# Patient Record
Sex: Female | Born: 1945 | Race: White | Smoking: Never smoker
Health system: Northeastern US, Academic
[De-identification: ages and names within clinical notes are randomized; demographics above are authoritative.]

## PROBLEM LIST (undated history)

## (undated) DIAGNOSIS — R7303 Prediabetes: Secondary | ICD-10-CM

## (undated) DIAGNOSIS — I2699 Other pulmonary embolism without acute cor pulmonale: Secondary | ICD-10-CM

## (undated) DIAGNOSIS — J45909 Unspecified asthma, uncomplicated: Secondary | ICD-10-CM

## (undated) DIAGNOSIS — I2510 Atherosclerotic heart disease of native coronary artery without angina pectoris: Secondary | ICD-10-CM

## (undated) DIAGNOSIS — M1990 Unspecified osteoarthritis, unspecified site: Secondary | ICD-10-CM

## (undated) DIAGNOSIS — I82409 Acute embolism and thrombosis of unspecified deep veins of unspecified lower extremity: Secondary | ICD-10-CM

## (undated) HISTORY — DX: Prediabetes: R73.03

## (undated) HISTORY — PX: CHOLECYSTECTOMY: SHX55

## (undated) HISTORY — PX: CYST REMOVAL: SHX22

## (undated) HISTORY — DX: Unspecified asthma, uncomplicated: J45.909

## (undated) HISTORY — PX: APPENDECTOMY: SHX54

## (undated) HISTORY — PX: TONSILLECTOMY AND ADENOIDECTOMY: SHX28

## (undated) HISTORY — PX: ACHILLES TENDON SURGERY: SHX542

## (undated) SURGERY — Surgical Case
Anesthesia: *Unknown

## (undated) NOTE — Progress Notes (Signed)
Formatting of this note is different from the original.  Subjective    [name] is a 89 y.o. female.    Chief Complaint   Patient presents with   ? Follow-up     3 mo follow up   ? Asthma   ? Hyperlipidemia   ? Congestive Heart Failure   ? Earache     Right ear ache     Patient with a medical history significant for prediabetes, CAD s/p triple bypass, carotid artery stenosis, DVT/PE, HLD, asthma, restless legs, and GERD presents for follow up.   -She is soon going to [place] for consultation regarding aortic stenosis  -Complains of right ear pain for the past 3-4 days       Never Smoker  Discussed meditteranean diet, it constituents, portion control, regular aerobic exercise with progressive increase in activity to achieve 40-50 mins of aerobic workout daily.     Past Medical History:   Diagnosis Date   ? Asthma    ? Cardiovascular disease    ? Delayed emergence from general anesthesia    ? Depression    ? DVT (deep venous thrombosis) (CMS/HCC) ([facility])    ? High cholesterol    ? Neck pain    ? PE (pulmonary thromboembolism) ([place])      Past Surgical History:   Procedure Laterality Date   ? APPENDECTOMY     ? ARTERIAL BYPASS SURGERY     ? CAROTID ARTERY ANGIOPLASTY Right    ? CHOLECYSTECTOMY     ? FINGER SURGERY      CYST   ? TONSILLECTOMY       Current Outpatient Medications   Medication Sig Dispense Refill   ? albuterol 2.5 mg /3 mL (0.083 %) nebulizer solution Take 3 mL (2.5 mg total) by nebulization 4 (four) times a day if needed for wheezing or shortness of breath. 360 mL 5   ? albuterol HFA (PROVENTIL HFA) 90 mcg/actuation inhaler Inhale 2 puffs every 4 (four) hours if needed for wheezing. 18 g 5   ? aspirin EC 81 mg tablet Take 1 tablet (81 mg total) by mouth in the morning. 90 tablet 2   ? budesonide (PULMICORT) 0.25 mg/2 mL nebulizer solution Take 2 mL (0.25 mg total) by nebulization 1 (one) time each day if needed (SOB). 60 mL 2   ? cetirizine (ZyrTEC) 10 mg tablet Take 2 tablets (20 mg total) by mouth  in the morning. 180 tablet 2   ? clopidogreL (PLAVIX) 75 mg tablet Take 1 tablet (75 mg total) by mouth in the morning. 90 tablet 3   ? docusate sodium (COLACE) 100 mg capsule Take 100 mg by mouth 2 (two) times a day if needed for constipation.     ? fluticasone propionate (FLONASE) 50 mcg/actuation nasal spray Administer 2 sprays into each nostril in the morning. 16 g 5   ? fluticasone-umeclidin-vilanter 200-62.5-25 mcg blister with device Inhale 1 puff daily.     ? gabapentin (NEURONTIN) 300 mg capsule Take 1 capsule (300 mg total) by mouth in the morning and 1 capsule (300 mg total) at noon and 1 capsule (300 mg total) in the evening. 270 capsule 2   ? montelukast (SINGULAIR) 10 mg tablet Take 1 tablet (10 mg total) by mouth every night. 90 tablet 2   ? pantoprazole (PROTONIX) 40 mg EC tablet Take 1 tablet (40 mg total) by mouth 1 (one) time each day before breakfast. Do not crush, chew, or split. 90 tablet  3   ? polyethylene glycol (MIRALAX) 17 gram/dose powder Take 17 g by mouth 1 (one) time each day if needed (CONSTIPATION).     ? sertraline (ZOLOFT) 100 mg tablet Take 1 tablet (100 mg total) by mouth in the morning. 90 tablet 3   ? atorvastatin (LIPITOR) 80 mg tablet Take 1 tablet (80 mg total) by mouth at bed time. 90 tablet 3     No current facility-administered medications for this visit.     Allergies   Allergen Reactions   ? Other Unknown     Steri-strips AFTER BYPASS - HIVES, ITCHING.     ? Morphine Hives and Rash     Has tolerated Percocet    IV MORPHINE      Review of Systems   Constitutional: Negative for fatigue, fever and unexpected weight change.   HENT: Positive for ear pain. Negative for hearing loss and sore throat.    Eyes: Negative for discharge, redness and visual disturbance.   Respiratory: Negative for cough and shortness of breath.    Cardiovascular: Negative for chest pain and palpitations.   Gastrointestinal: Negative for abdominal pain, constipation, diarrhea, nausea and vomiting.    Endocrine: Negative for cold intolerance, heat intolerance and polydipsia.   Genitourinary: Negative for dysuria, frequency and hematuria.   Musculoskeletal: Negative for arthralgias and myalgias.   Skin: Negative for rash.   Neurological: Negative for headaches.   Hematological: Does not bruise/bleed easily.   Psychiatric/Behavioral: Negative for dysphoric mood.     Objective   Visit Vitals  BP 112/64 (BP Location: Left arm, Patient Position: Sitting, BP Cuff Size: Adult)   Pulse 80   Temp 36.5 C (97.7 F) (Temporal)   Resp 18   Ht '5\' 7"'$  (1.702 m)   Wt 105 kg (231 lb 9.6 oz)   SpO2 97%   BMI 36.27 kg/m   OB Status Postmenopausal   Smoking Status Never   BSA 2.23 m     Physical Exam  Constitutional:       General: She is not in acute distress.     Appearance: She is not ill-appearing.   HENT:      Right Ear: There is impacted cerumen.      Left Ear: Tympanic membrane, ear canal and external ear normal. There is no impacted cerumen.   Cardiovascular:      Rate and Rhythm: Normal rate and regular rhythm.      Pulses: Normal pulses.      Heart sounds: Murmur heard.   Pulmonary:      Effort: Pulmonary effort is normal. No respiratory distress.      Breath sounds: Normal breath sounds.   Neurological:      Mental Status: She is alert and oriented to person, place, and time.   Psychiatric:         Mood and Affect: Mood normal.     Diagnoses and all orders for this visit:  Anxiety and depression  Comments:  Stable. Continue Zoloft 100 mg daily.  Never smoked cigarettes  Class 2 obesity due to excess calories without serious comorbidity with body mass index (BMI) of 36.0 to 36.9 in adult  Comments:  BMI counseling provided regarding lifestyle modification, healthy diet, regular exercise as tolerated    Mixed hyperlipidemia  Comments:  Controlled. Continue Atorvastatin 80 mg daily.  Orders:  -     atorvastatin (LIPITOR) 80 mg tablet; Take 1 tablet (80 mg total) by mouth at bed time.  Prediabetes  Comments:  Recommend  adherence to ADA diet.  Excessive ear wax, right  Comments:  Patient declines removal at this time.   Moderate persistent asthma without complication  Comments:  Stable. Continue current management.      Follow up in about 6 months (around 05/08/2022).    Estevan Ryder, MD  11/06/2021      Electronically signed by Estevan Ryder, MD at 11/06/2021 11:04 PM EDT

---

## 2010-05-13 HISTORY — PX: CORONARY ANGIOPLASTY WITH STENT PLACEMENT: SHX49

## 2011-05-14 HISTORY — PX: CORONARY ARTERY BYPASS GRAFT: SHX141

## 2013-03-27 DIAGNOSIS — E782 Mixed hyperlipidemia: Secondary | ICD-10-CM | POA: Insufficient documentation

## 2013-03-27 DIAGNOSIS — I10 Essential (primary) hypertension: Secondary | ICD-10-CM | POA: Insufficient documentation

## 2013-03-27 DIAGNOSIS — J45909 Unspecified asthma, uncomplicated: Secondary | ICD-10-CM | POA: Insufficient documentation

## 2013-03-27 DIAGNOSIS — M549 Dorsalgia, unspecified: Secondary | ICD-10-CM | POA: Insufficient documentation

## 2013-03-27 DIAGNOSIS — I2510 Atherosclerotic heart disease of native coronary artery without angina pectoris: Secondary | ICD-10-CM | POA: Insufficient documentation

## 2013-04-12 DIAGNOSIS — R0602 Shortness of breath: Secondary | ICD-10-CM | POA: Insufficient documentation

## 2013-04-12 DIAGNOSIS — I82401 Acute embolism and thrombosis of unspecified deep veins of right lower extremity: Secondary | ICD-10-CM | POA: Insufficient documentation

## 2013-04-12 DIAGNOSIS — I2699 Other pulmonary embolism without acute cor pulmonale: Secondary | ICD-10-CM | POA: Insufficient documentation

## 2013-06-12 ENCOUNTER — Observation Stay
Admission: EM | Admit: 2013-06-12 | Disposition: A | Discharge status: Home / Self Care | Payer: Self-pay | Source: Ambulatory Visit | Attending: Emergency Medicine | Admitting: Emergency Medicine

## 2013-06-12 ENCOUNTER — Encounter: Payer: Self-pay | Admitting: Emergency Medicine

## 2013-06-12 ENCOUNTER — Other Ambulatory Visit: Payer: Self-pay | Admitting: Gastroenterology

## 2013-06-12 HISTORY — DX: Other pulmonary embolism without acute cor pulmonale: I26.99

## 2013-06-12 HISTORY — DX: Atherosclerotic heart disease of native coronary artery without angina pectoris: I25.10

## 2013-06-12 HISTORY — DX: Acute embolism and thrombosis of unspecified deep veins of unspecified lower extremity: I82.409

## 2013-06-12 HISTORY — DX: Unspecified osteoarthritis, unspecified site: M19.90

## 2013-06-12 LAB — BASIC METABOLIC PANEL
Anion Gap: 11 (ref 7–16)
CO2: 26 mmol/L (ref 20–28)
Calcium: 8.7 mg/dL (ref 8.6–10.2)
Chloride: 105 mmol/L (ref 96–108)
Creatinine: 0.69 mg/dL (ref 0.51–0.95)
GFR,Black: 104 *
GFR,Caucasian: 91 *
Glucose: 111 mg/dL — ABNORMAL HIGH (ref 60–99)
Lab: 17 mg/dL (ref 6–20)
Potassium: 4.3 mmol/L (ref 3.3–5.1)
Sodium: 142 mmol/L (ref 133–145)

## 2013-06-12 LAB — HOLD BLUE

## 2013-06-12 LAB — CBC AND DIFFERENTIAL
Baso # K/uL: 0.1 10*3/uL (ref 0.0–0.1)
Basophil %: 0.7 % (ref 0.1–1.2)
Eos # K/uL: 0.2 10*3/uL (ref 0.0–0.4)
Eosinophil %: 2.1 % (ref 0.7–5.8)
Hematocrit: 39 % (ref 34–45)
Hemoglobin: 11.9 g/dL (ref 11.2–15.7)
Lymph # K/uL: 2.5 10*3/uL (ref 1.2–3.7)
Lymphocyte %: 26.4 % (ref 19.3–51.7)
MCH: 28 pg/cell (ref 26–32)
MCHC: 31 g/dL — ABNORMAL LOW (ref 32–36)
MCV: 92 fL (ref 79–95)
Mono # K/uL: 0.9 10*3/uL (ref 0.2–0.9)
Monocyte %: 9.7 % (ref 4.7–12.5)
Neut # K/uL: 5.8 10*3/uL (ref 1.6–6.1)
Platelets: 331 10*3/uL (ref 160–370)
RBC: 4.2 MIL/uL (ref 3.9–5.2)
RDW: 14.2 % (ref 11.7–14.4)
Seg Neut %: 61.1 % (ref 34.0–71.1)
WBC: 9.6 10*3/uL (ref 4.0–10.0)

## 2013-06-12 LAB — PROTIME-INR
INR: 2.3 — ABNORMAL HIGH (ref 1.0–1.2)
Protime: 25 s — ABNORMAL HIGH (ref 9.2–12.3)

## 2013-06-12 LAB — TROPONIN T: Troponin T: <0.01 ng/mL (ref 0.00–0.02)

## 2013-06-12 LAB — NT-PRO BNP: NT-pro BNP: 105 pg/mL (ref 0–900)

## 2013-06-12 MED ORDER — IPRATROPIUM-ALBUTEROL 0.5-2.5 MG/3ML IN SOLN *I*
3.0000 mL | RESPIRATORY_TRACT | Status: AC
Start: 2013-06-12 — End: 2013-06-13
  Administered 2013-06-12 – 2013-06-13 (×3): 3 mL via RESPIRATORY_TRACT
  Filled 2013-06-12 (×3): qty 3

## 2013-06-12 MED ORDER — ASPIRIN 81 MG PO CHEW *I*
324.0000 mg | CHEWABLE_TABLET | Freq: Once | ORAL | Status: AC
Start: 2013-06-12 — End: 2013-06-12
  Administered 2013-06-12: 324 mg via ORAL
  Filled 2013-06-12: qty 4

## 2013-06-12 NOTE — ED Notes (Signed)
x2 weeks increase fluid, SOB to the bathroom, mild chest pain.  bilateral legs swelling.

## 2013-06-12 NOTE — First Provider Contact (Signed)
ED Medical Screening Exam Note    Initial provider evaluation performed by   ED First Provider Contact    Date/Time Event User Comments    06/12/13 2010 ED Provider First Contact Keelia Graybill Initial Face to Face Provider Contact        68 year old female with a hx of "open heart surgery" who presents to the ED with the chief complaint of increased SOB, lower extremity edema and mild chest pressure x 1 week.  She reports all symptoms seem to worsen with exertion.  Not on any diuretics.  Just moved to [place] - suppose to see Dr. Kerin Ransom for cardiology.        Orders placed:  EKG, LABS and XRAYS     Patient requires further evaluation.     Tiyanna Larcom, PA, 06/12/2013, 8:10 PM

## 2013-06-12 NOTE — ED Provider Notes (Addendum)
History     Chief Complaint   Patient presents with    Shortness of Breath    Chest Pain     HPI Comments: 68 yo female with pmh of CAD s/p cabg (last year), DVT/PE (last year) on warfarin, asthma c/o sob.  For the past few weeks having gradual worsening dyspnea on exertion, worsening LE edema b/l.  It has become so severe she cannot walk to the bathroom without having severe sob.  Occasionally has left sided chest pain during these episodes , pulling sensation radiating to shoulder, lasting about a few minutes, resolves with rest.      Admits to orthopnea.  Denies nausea, vomiting, diarrhea.  She does not take any diuretics.    She just moved to [place] in August, has appt with cardiology Dr. Kerin Ransom 2/16.        History provided by:  Patient      Past Medical History   Diagnosis Date    CAD (coronary artery disease)     PE (pulmonary embolism)     DVT (deep venous thrombosis)     Arthritis     Diabetes mellitus      diet controlled            Past Surgical History   Procedure Laterality Date    Coronary artery bypass graft      Appendectomy      Tonsillectomy and adenoidectomy      Cholecystectomy      Cyst removal       both thumbs       Family History   Problem Relation Age of Onset    Other Mother      "rare lung disease"    Aneurysm Mother      abdominal aortic aneurysm    Stroke Father     Stroke Brother          Social History      reports that she has never smoked. She does not have any smokeless tobacco history on file. She reports that she does not drink alcohol or use illicit drugs. Her sexual activity history is not on file.    Living Situation    Questions Responses    Patient lives with Spouse    Homeless     Caregiver for other family member     External Services     Employment     Domestic Violence Risk           Problem List     Patient Active Problem List   Diagnosis Code    Dyspnea on exertion 786.09       Review of Systems   Review of Systems   Constitutional: Positive for  fatigue. Negative for fever.   HENT: Negative for neck pain.    Respiratory: Positive for shortness of breath.    Cardiovascular: Positive for leg swelling. Negative for chest pain.   Gastrointestinal: Negative for abdominal pain.   Genitourinary: Negative for dysuria.   Musculoskeletal: Negative for back pain.   Skin: Negative for rash.   Neurological: Negative for syncope.   Hematological: Bruises/bleeds easily (warfarin).   Psychiatric/Behavioral: Negative for confusion.       Physical Exam       ED Triage Vitals   BP Heart Rate Heart Rate(via Pulse Ox) Resp Temp Temp Source SpO2 O2 Device O2 Flow Rate   06/12/13 2010 06/12/13 2010 -- 06/12/13 2010 06/12/13 2010 06/13/13 0211 06/12/13 2010 06/12/13 2128 --  178/100 mmHg 96  18 36 C (96.8 F) TEMPORAL 98 % None (Room air)       Weight           06/12/13 2010           124.739 kg (275 lb)               Physical Exam   Nursing note and vitals reviewed.  Constitutional: She is oriented to person, place, and time. She appears well-developed and well-nourished. No distress.   HENT:   Head: Normocephalic and atraumatic.   Eyes: Pupils are equal, round, and reactive to light.   Neck: Neck supple. No JVD present.   Cardiovascular: Normal rate, regular rhythm, normal heart sounds and intact distal pulses.  Exam reveals no gallop and no friction rub.    No murmur heard.  Pulmonary/Chest: Effort normal. No stridor. No respiratory distress. She has no wheezes. She has rales (fine at bases b/l).   Sob when sitting up in bed, no hypoxia   Abdominal: Soft. Bowel sounds are normal. She exhibits no distension. There is no tenderness. There is no rebound and no guarding.   Musculoskeletal: Normal range of motion. She exhibits edema (2+ LE Edema b/l).   Neurological: She is alert and oriented to person, place, and time.   Skin: Skin is warm and dry. She is not diaphoretic.   Psychiatric: She has a normal mood and affect.       Medical Decision Making      Amount and/or Complexity  of Data Reviewed  Clinical lab tests: ordered  Tests in the radiology section of CPT: ordered        Initial Evaluation:  ED First Provider Contact    Date/Time Event User Comments    06/12/13 2010 ED Provider First Contact EARLY, KRISTINE Initial Face to Face Provider Contact          Patient seen by me on arrival date of 06/12/2013 at 2131    Assessment:  68 y.o., female with pmh of cad s/p cabg, dvt/pe on warfarin, and asthma comes to the ED with worsening DOE, and LE edema, sometimes a/w chest pain    Differential Diagnosis includes chf exacerbation, asthma exacerbation, stable angina, ACS, PE (however anticoagulated with warfarin), anemia             Plan:   Cbc, bmp, bnp, troponin, inr  xr chest, ekg, tele  Consider cta chest if INR not therapeutic  Asa 324 mg PO, duonebs x 3  Anticipate admission for possible echo and sx treatment    Luiz Blare, MD          Luiz Blare, MD  06/13/13 [phone]        Resident Attestation:     Patient seen by me 06/12/13 at 2335    History:   I reviewed this patient, reviewed the resident's note and agree.  Exam:   I examined this patient, reviewed the resident's note and agree.    Decision Making:   I discussed with the resident his/her documented decision making  and agree.      Author Dell Ponto, MD    Dell Ponto, MD  06/14/13 [phone]

## 2013-06-12 NOTE — ED Notes (Signed)
Ambulated to bathroom with no assistance with steady gait.  Dyspnea at rest and exertion

## 2013-06-13 ENCOUNTER — Encounter: Payer: Self-pay | Admitting: Internal Medicine

## 2013-06-13 LAB — PROTIME-INR
INR: 2.3 — ABNORMAL HIGH (ref 1.0–1.2)
Protime: 25 s — ABNORMAL HIGH (ref 9.2–12.3)

## 2013-06-13 LAB — BASIC METABOLIC PANEL
Anion Gap: 14 (ref 7–16)
CO2: 23 mmol/L (ref 20–28)
Calcium: 8.5 mg/dL — ABNORMAL LOW (ref 8.6–10.2)
Chloride: 107 mmol/L (ref 96–108)
Creatinine: 0.72 mg/dL (ref 0.51–0.95)
GFR,Black: 100 *
GFR,Caucasian: 87 *
Glucose: 110 mg/dL — ABNORMAL HIGH (ref 60–99)
Lab: 16 mg/dL (ref 6–20)
Potassium: 4 mmol/L (ref 3.3–5.1)
Sodium: 144 mmol/L (ref 133–145)

## 2013-06-13 LAB — CBC AND DIFFERENTIAL
Baso # K/uL: 0.1 10*3/uL (ref 0.0–0.1)
Basophil %: 0.7 % (ref 0.1–1.2)
Eos # K/uL: 0.2 10*3/uL (ref 0.0–0.4)
Eosinophil %: 2.1 % (ref 0.7–5.8)
Hematocrit: 37 % (ref 34–45)
Hemoglobin: 11.5 g/dL (ref 11.2–15.7)
Lymph # K/uL: 2.7 10*3/uL (ref 1.2–3.7)
Lymphocyte %: 28.1 % (ref 19.3–51.7)
MCH: 29 pg/cell (ref 26–32)
MCHC: 31 g/dL — ABNORMAL LOW (ref 32–36)
MCV: 91 fL (ref 79–95)
Mono # K/uL: 0.8 10*3/uL (ref 0.2–0.9)
Monocyte %: 8.6 % (ref 4.7–12.5)
Neut # K/uL: 5.8 10*3/uL (ref 1.6–6.1)
Platelets: 306 10*3/uL (ref 160–370)
RBC: 4 MIL/uL (ref 3.9–5.2)
RDW: 14.2 % (ref 11.7–14.4)
Seg Neut %: 60.5 % (ref 34.0–71.1)
WBC: 9.6 10*3/uL (ref 4.0–10.0)

## 2013-06-13 LAB — TROPONIN T
Troponin T: <0.01 ng/mL (ref 0.00–0.02)
Troponin T: <0.01 ng/mL (ref 0.00–0.02)

## 2013-06-13 MED ORDER — DOCUSATE SODIUM 100 MG PO CAPS *I*
200.0000 mg | ORAL_CAPSULE | Freq: Every day | ORAL | Status: DC
Start: 2013-06-13 — End: 2013-06-16
  Administered 2013-06-13 – 2013-06-16 (×4): 200 mg via ORAL
  Filled 2013-06-13 (×4): qty 2

## 2013-06-13 MED ORDER — ATORVASTATIN CALCIUM 10 MG PO TABS *I*
10.0000 mg | ORAL_TABLET | Freq: Every day | ORAL | Status: DC
Start: 2013-06-13 — End: 2013-06-16
  Administered 2013-06-13 – 2013-06-15 (×3): 10 mg via ORAL
  Filled 2013-06-13 (×5): qty 1

## 2013-06-13 MED ORDER — WARFARIN SODIUM 5 MG PO TABS *I*
5.0000 mg | ORAL_TABLET | ORAL | Status: DC
Start: 2013-06-13 — End: 2013-06-16
  Administered 2013-06-13 – 2013-06-14 (×2): 5 mg via ORAL
  Filled 2013-06-13 (×4): qty 1

## 2013-06-13 MED ORDER — LORATADINE 10 MG PO TABS *I*
10.0000 mg | ORAL_TABLET | Freq: Every day | ORAL | Status: DC
Start: 2013-06-13 — End: 2013-06-16
  Administered 2013-06-13 – 2013-06-16 (×4): 10 mg via ORAL
  Filled 2013-06-13 (×4): qty 1

## 2013-06-13 MED ORDER — MONTELUKAST SODIUM 10 MG PO TABS *I*
10.0000 mg | ORAL_TABLET | Freq: Every evening | ORAL | Status: DC
Start: 2013-06-13 — End: 2013-06-16
  Administered 2013-06-13 – 2013-06-15 (×3): 10 mg via ORAL
  Filled 2013-06-13 (×6): qty 1

## 2013-06-13 MED ORDER — METOPROLOL TARTRATE 12.5 MG PO CAPS *I*
12.5000 mg | ORAL_CAPSULE | Freq: Two times a day (BID) | ORAL | Status: DC
Start: 2013-06-13 — End: 2013-06-16
  Administered 2013-06-13 – 2013-06-16 (×7): 12.5 mg via ORAL
  Filled 2013-06-13 (×7): qty 1

## 2013-06-13 MED ORDER — CLOPIDOGREL BISULFATE 75 MG PO TABS *I*
75.0000 mg | ORAL_TABLET | Freq: Every day | ORAL | Status: DC
Start: 2013-06-13 — End: 2013-06-16
  Administered 2013-06-13 – 2013-06-16 (×4): 75 mg via ORAL
  Filled 2013-06-13 (×4): qty 1

## 2013-06-13 MED ORDER — BUDESONIDE-FORMOTEROL FUMARATE 160-4.5 MCG/ACT IN AERO *I*
2.0000 | INHALATION_SPRAY | Freq: Two times a day (BID) | RESPIRATORY_TRACT | Status: DC
Start: 2013-06-13 — End: 2013-06-16
  Administered 2013-06-13 – 2013-06-16 (×7): 2 via RESPIRATORY_TRACT
  Filled 2013-06-13: qty 6

## 2013-06-13 MED ORDER — WARFARIN SODIUM 2.5 MG PO TABS *I*
2.5000 mg | ORAL_TABLET | ORAL | Status: DC
Start: 2013-06-15 — End: 2013-06-16
  Administered 2013-06-15: 2.5 mg via ORAL
  Filled 2013-06-13 (×2): qty 1

## 2013-06-13 MED ORDER — ALBUTEROL SULFATE (2.5 MG/3ML) 0.083% IN NEBU *I*
2.5000 mg | INHALATION_SOLUTION | RESPIRATORY_TRACT | Status: DC | PRN
Start: 2013-06-13 — End: 2013-06-16

## 2013-06-13 MED ORDER — FUROSEMIDE 10 MG/ML IJ SOLN *I*
20.0000 mg | Freq: Two times a day (BID) | INTRAMUSCULAR | Status: AC
Start: 2013-06-13 — End: 2013-06-14
  Administered 2013-06-13 – 2013-06-14 (×4): 20 mg via INTRAVENOUS
  Filled 2013-06-13 (×4): qty 2

## 2013-06-13 MED ORDER — ISOSORBIDE MONONITRATE CR 30 MG PO TB24 *I*
30.0000 mg | ORAL_TABLET | Freq: Every day | ORAL | Status: DC
Start: 2013-06-13 — End: 2013-06-16
  Administered 2013-06-13 – 2013-06-16 (×4): 30 mg via ORAL
  Filled 2013-06-13 (×5): qty 1

## 2013-06-13 NOTE — H&P (Addendum)
HOSPITAL MEDICINE ADMISSION NOTE    Chief Complaint:   Shortness of breath    History of Present Illness   [name] is a 68 yo woman with a PMH of CAD s/p CABG, asthma, diet controlled type II DM, morbid obesity, PE and DVT who presents with dyspnea on exertion. She reports that her breathing has worsened over the past few days, but she felt very short of breath after moving back to [place] from [place] two months ago. Her asthma has been under poor control since discontinuing allergy shots two years ago. Her physician recently increased her Advair dose to 500-50 and added Singulair, but she doesn't think that it is improving her breathing. She endorses dyspnea on exertion, a non-productive cough, new LE edema, and chronic orthopnea. On arrival she received Duonebs q20 min x 3 doses with improvement in her shortness of breath. She also reports intermittent left sided chest pressure which radiates to the left shoulder, which feels similar in nature to her previous myocardial infarction. The pain occurs at rest, resolves after a few minutes, and is not associated with any other symptoms. She denies chest pressure/pain with exertion, though due to the dyspnea she has not been very physically active. She denies fevers, chills, nasal congestion, rhinorrhea, lacrimation. She has not had any recent sick contacts.  She denies any medication changes.     ROS:  A 12 point review of systems was completed  CONSTITUTIONAL: no fever, chills  HEENT:  No rhinorrhea, post nasal drip or pharyngitis  CV:  No chest pain/pressure or discomfort presently, no palpitations, + lower extremity edema  PULM:  + shortness of breath, + cough, no sputum production  GI:   no abdominal pain, nausea, emesis  GU:  No dysuria urgency or frequency  NEURO: no dizziness, syncope  PSYCH:  No depression or anxiety  ENDO:  No heat or cold intolerance   HEME: no easy bruising or bleeding  SKIN:  No rashes or itching  MUSK:  No joint pain or stiffness,  no myalgias    Prior to Admission Medications   Reviewed and updated by Probation officer.    Allergies     Allergies   Allergen Reactions    Morphine Hives    Penicillins Hives     Past Medical History     Past Medical History   Diagnosis Date    CAD (coronary artery disease)     PE (pulmonary embolism)     DVT (deep venous thrombosis)     Arthritis     Diabetes mellitus      diet controlled     Surgical History     Past Surgical History   Procedure Laterality Date    Coronary artery bypass graft      Appendectomy      Tonsillectomy and adenoidectomy      Cholecystectomy      Cyst removal       both thumbs     Family History     Family History   Problem Relation Age of Onset    Other Mother      "rare lung disease"    Aneurysm Mother      abdominal aortic aneurysm    Stroke Father     Stroke Brother      Social History     Social History     Occupational History    Not on file.     Social History Main Topics    Smoking status:  Never Smoker     Smokeless tobacco: Not on file    Alcohol Use: No    Drug Use: No    Sexual Activity: Not on file     Objective     Filed Vitals:    06/13/13 0211   BP: 108/67   Pulse: 94   Temp: 36.3 C (97.3 F)   Resp: 14   Height:    Weight:        Physical Exam     GEN no acute distress, morbidly obese older woman  HEENT: periorbital xanthelasmas, MMM, no nasal congestion  CV: RRR, s1s2 present, no m/r/g, JVD within normal limits  PULM: breathing comfortably, cta bilaterally, coughs with deep inspiration  ABD: soft, nt/nd, bs+, no masses or organomegaly  EXT: warm, dry, 1+ pitting edema of the left lower extremity, trace pitting edema of the right lower extremity    NEURO: Aox3, no focal deficits    Laboratory Data         Component Value Date/Time    NA 142 06/12/2013 2037    K 4.3 06/12/2013 2037    CL 105 06/12/2013 2037    CO2 26 06/12/2013 2037    UN 17 06/12/2013 2037    CREAT 0.69 06/12/2013 2037    GFRC 91 06/12/2013 2037    GFRB 104 06/12/2013 2037    GLU 111* 06/12/2013 2037     CA 8.7 06/12/2013 2037          Component Value Date/Time    WBC 9.6 06/12/2013 2037    HGB 11.9 06/12/2013 2037    HCT 39 06/12/2013 2037    PLT 331 06/12/2013 2037        Recent Labs  Lab 06/12/13  2037   Troponin T <0.01          Imaging     CXR 06/12/2013:   1. No focal interstitial or alveolar consolidation. 2. Mildly hyperexpanded lungs may represent emphysematous changes.      EKG: NSR at 96 bpm, poor R wave progression in the anterior leads, no Q waves, ST changes or T wave inversions    Assessment   [name] is a 68 yo woman with a PMH of CAD s/p CABG, asthma, diet controlled type II DM, morbid obesity, PE and DVT who presents with dyspnea on exertion. Differential includes asthma given that her symptoms improved with Duonebs and had worsened after returning home without continuing allergy injections; unstable angina given chest pressure occurs at rest and is similar to the pain she experienced with her prior MI. PE is unlikely as she has been compliant with coumadin and her INR is therapeutic.       Plan     Asthma: Though she reports uncontrolled allergies, she is compliant with her medications, there is no indication of a viral/bacterial infection which might be provoking an asthma exacerbation.  -Continue Montelukast 10 mg daily, will switch from Advair to Symbicort while admitted, continue albuterol prn, add Loratadine 10 mg daily, encouraged her to re-establish care with a local allergist.     Chest Pain: Her SOB could be an anginal equivalent and the chest pain/pressure occuring at rest is concerning for unstable angina, especially since it is identical to the pain she experienced with her prior MI.   -Continue Atorvastatin, Plavix and Metoprolol. Hold aspirin as she is already on Warfarin/Plavix.   -trend troponins  -telemetry  -Given her cardiac history, would consider stress testing    Diabetes  [place]: Diet controlled   -Diabetic diet, monitor with BMPs    History of PE/DVT:  -Continue home  coumadin 5 mg Sun, Mon, Wed, Fri; 2.5 mg Tues, Thurs, Sat.     FEN:  F: no IVF  E: repeat BMP in AM  N: diabetic diet    DVT PPX:  coumadin    CODE STATUS:  Full Code    Disposition:  Admitted to medicine  Med Reconciliation:  Will need to be confirmed as she is not sure of the dosages of her medications except for Coumadin.     Signed by Andi Hence, MD 06/13/2013 at 2:42 AM   Internal medicine PGY3    I saw and evaluated the patient. I agree with the resident's/fellow's findings and plan of care as documented above.    Worsening DOE starting 2 weeks ago. Prior to that she could walk around the house and around grocery store without SOB. Last 2 weeks dyspnea with walking to rest room. Chest pressure at rest with occasional radiation to left shoulder. I did not get a sense however that the chest pain was exacerbated by activity. She says it feels similar to before her CABBG in Aug 2013 followed by PCI Nov 2013. She weights 300 LBS now and a few weeks ago she weighed near 275. She admits to more peripheral edema. She is compliant with CPAP. She was hospitalized at [facility] Dec 26-Jan 2 for SOB and was found to have the flu and treated with tamiflu. She was surprised that she had the flu as she did not have typical symptoms.    Aox3, persistent dry cough   No LAD, OP clear, no erythema or exudate on post pharynx  Unable to assess JVP flat or upright given habitus  Lungs completely clear with normal vesicular breath sounds, even upper airways with normal bronchial-vesicular sounds  abd benign  2+ pitting edema    Labs reviewed, imaging reviewed  BNP 100    Given high risk will obtain SPECT/Regadenosin tomorrow  Trial of lasix 40 IV BID to see if improves dyspnea with possible Right heart dysfunction  Nebs as needed  One more set of cardiac enzymes  Might have a viral URI, supportive care  Daily weights  Obtain records from [facility] in [place] of plan per d.w HM1 team this morning    Trevino Wyatt [place], MD

## 2013-06-13 NOTE — Progress Notes (Signed)
Descriptive Sentence/ Reason for admission (Big Picture)   68yo female presented with worsening SOB at rest and with exertion since moving from [facility] 2 months ago  Hx CAD s/p CABG, poorly controlled asthma,MI, DM2, morbid obesity, PE and DVT  Active Issues/ Relevant Events (Last 24 hrs)   - Pt arrived to unit at 0530 from ED via stretcher  - A&Ox3  - Pt denied all pain, no Left chest pressure  - NS on tele  - Denied SOB at rest, a little SOB while ambulating on RA to bathroom  - trop neg x2  To Do List   - monitor resp status, SpO2  - tele  - trend trop q 8   Anticipatory Guidance/ DC Planning   Continue care, possible stress test    Pt oriented to unit. Pt has glasses and cell phone. Pt has no complaints at this time, denies all pain.     Danton Clap, RN

## 2013-06-13 NOTE — ED Notes (Signed)
PT ambulated on RA. Sitting in bed pulse ox at 95. While ambulating pulse ox ranging between 93-95. PT feels SOB while ambulating

## 2013-06-13 NOTE — ED Notes (Signed)
Report given to 218

## 2013-06-13 NOTE — ED Notes (Signed)
Received report, assumed patient care at this time

## 2013-06-13 NOTE — Progress Notes (Signed)
Descriptive Sentence/ Reason for admission (Big Picture)   68yo female presented with worsening SOB at rest and with exertion since moving from [facility] 2 months ago  Hx CAD s/p CABG, poorly controlled asthma,MI, DM2, morbid obesity, PE and DVT  Active Issues/ Relevant Events (Last 24 hrs)   -reports becoming SOB with exertion, occasional strong, non-productive cough  -3 negative trops   - NS on tele, occasionally becomes sinus tachy when ambulating  -+1 bilateral lower extremity edema  To Do List   -monitor resp status  -monitor tele  -stress test to be done on 2/2  Anticipatory Guidance/ DC Planning   Discharge home when medically stable.    Report to be given to Leta Baptist, Therapist, sports.

## 2013-06-13 NOTE — Plan of Care (Signed)
Problem: Safety  Goal: Patient will remain free of falls  Outcome: Maintaining  Intervention: Assess Falls Risk  Fall risk assessed every shift. Pt medium risk for falls   Intervention: Initiate falls precautions, if indicated  Fall precautions initiated. Red socks   Intervention: Hourly visual checks  Pt visually assessed every 1-2 hours      Problem: Psychosocial  Goal: Demonstrates ability to cope with illness  Outcome: Maintaining  Intervention: Encourage verbalization of feelings/concerns/expectations  Pt encouraged to express feelings and concerns.   Intervention: Assess Pt/Fam needs during frequent rounding  Pts needs are assessed during hourly rounding        Problem: Impaired Gas Exchange  Goal: Patient will maintain adequate oxygenation  Outcome: Maintaining  Intervention: Assess for restlessness, anxiety and air hunger  Pt monitored for restlessness, anxiety and air hunger. Pt not showing any s/s. Pt DOE while ambulating to bathroom   Intervention: Monitor oxygen saturation and ABGs, as indicated  SpO2 monitored q 4

## 2013-06-14 ENCOUNTER — Other Ambulatory Visit: Payer: Self-pay | Admitting: Gastroenterology

## 2013-06-14 ENCOUNTER — Ambulatory Visit: Payer: Self-pay

## 2013-06-14 LAB — CBC AND DIFFERENTIAL
Baso # K/uL: 0.1 10*3/uL (ref 0.0–0.1)
Basophil %: 1.2 % (ref 0.1–1.2)
Eos # K/uL: 0.2 10*3/uL (ref 0.0–0.4)
Eosinophil %: 2.7 % (ref 0.7–5.8)
Hematocrit: 40 % (ref 34–45)
Hemoglobin: 12.2 g/dL (ref 11.2–15.7)
Lymph # K/uL: 2.3 10*3/uL (ref 1.2–3.7)
Lymphocyte %: 28 % (ref 19.3–51.7)
MCH: 28 pg/cell (ref 26–32)
MCHC: 31 g/dL — ABNORMAL LOW (ref 32–36)
MCV: 92 fL (ref 79–95)
Mono # K/uL: 0.7 10*3/uL (ref 0.2–0.9)
Monocyte %: 8.7 % (ref 4.7–12.5)
Neut # K/uL: 5 10*3/uL (ref 1.6–6.1)
Platelets: 333 10*3/uL (ref 160–370)
RBC: 4.3 MIL/uL (ref 3.9–5.2)
RDW: 14.2 % (ref 11.7–14.4)
Seg Neut %: 59.4 % (ref 34.0–71.1)
WBC: 8.4 10*3/uL (ref 4.0–10.0)

## 2013-06-14 LAB — BASIC METABOLIC PANEL
Anion Gap: 10 (ref 7–16)
CO2: 27 mmol/L (ref 20–28)
Calcium: 8.9 mg/dL (ref 8.6–10.2)
Chloride: 104 mmol/L (ref 96–108)
Creatinine: 0.77 mg/dL (ref 0.51–0.95)
GFR,Black: 93 *
GFR,Caucasian: 80 *
Glucose: 119 mg/dL — ABNORMAL HIGH (ref 60–99)
Lab: 19 mg/dL (ref 6–20)
Potassium: 4.5 mmol/L (ref 3.3–5.1)
Sodium: 141 mmol/L (ref 133–145)

## 2013-06-14 LAB — EKG 12-LEAD
P: 27 degrees
QRS: 59 degrees
Rate: 96 {beats}/min
Severity: ABNORMAL
Severity: ABNORMAL
Statement: BORDERLINE
T: 10 degrees

## 2013-06-14 LAB — PROTIME-INR
INR: 1.6 — ABNORMAL HIGH (ref 1.0–1.2)
Protime: 17.7 s — ABNORMAL HIGH (ref 9.2–12.3)

## 2013-06-14 LAB — RESPIRATORY VIRAL/BACTERIAL PCR PANEL: Respiratory Viral/Bacterial PCR Panel: 0

## 2013-06-14 MED ORDER — ENOXAPARIN SODIUM 120 MG/0.8ML IJ SOSY *I*
1.0000 mg/kg | PREFILLED_SYRINGE | Freq: Two times a day (BID) | INTRAMUSCULAR | Status: DC
Start: 2013-06-14 — End: 2013-06-15
  Administered 2013-06-14 – 2013-06-15 (×3): 120 mg via SUBCUTANEOUS
  Filled 2013-06-14 (×4): qty 0.8

## 2013-06-14 NOTE — Progress Notes (Signed)
Utilization Management    Level of Care Observation service as of the date 06/13/2013      Rudean Hitt, RN     Pager: [phone]    Notified of observation 06/14/2013  Patient verbalized understanding and Probation officer provided patient with [facility]'s phone number, copy of notification letter and acknowledgment of receipt

## 2013-06-14 NOTE — Progress Notes (Signed)
Descriptive Sentence/ Reason for admission (Big Picture)   68yo female presented with worsening SOB at rest and with exertion since moving from [facility] 2 months ago  Hx CAD s/p CABG, poorly controlled asthma,MI, DM2, morbid obesity, PE and DVT  Active Issues/ Relevant Events (Last 24 hrs)   -reports becoming SOB with exertion, occasional strong, non-productive cough  -3 negative trops   - NS on tele, occasionally becomes sinus tachy when ambulating  -bilateral lower extremity edema  - NPO overnight   To Do List   -monitor resp status  -monitor tele  -stress test to be done at 1230   Anticipatory [place]   Discharge home when medically stable.    Pt denied all pain overnight. Had some SOB while ambulating around unit, quickly recovers at rest.     Danton Clap, RN

## 2013-06-14 NOTE — Progress Notes (Signed)
Descriptive Sentence/ Reason for admission (Big Picture)   68yo female presented with worsening SOB at rest and with exertion since moving from [facility] 2 months ago  Hx CAD s/p CABG, poorly controlled asthma,MI, DM2, morbid obesity, PE and DVT  Active Issues/ Relevant Events (Last 24 hrs)   -reports becoming SOB with exertion, occasional strong, non-productive cough  -3 negative trops   - NS on tele, occasionally becomes sinus tachy when ambulating. Various non-sustained runs of v-tach today; provider notified, no response.   -bilateral lower extremity edema  -stress test done today   To Do List   -monitor resp status  -monitor tele  Anticipatory Guidance/ DC Planning   Discharge home when medically stable.    Report to be given to R. Ware-Orr, RN

## 2013-06-14 NOTE — Progress Notes (Addendum)
Hospital Medicine Progress Note                                                Significant 24 Hour Events    Patient was admitted yesterday for dyspnea on exertion, dry cough, recent lower extremity swelling that has progressed over the past two weeks to the point that she gets short of breath walking to the next room.  She also has felt chest pressure that does not seem to be exacerbated by activity, but does feel similar per her report to chest pressure she experienced prior to CABG in 2013.  No acute events overnight.     Subjective    She states her shortness of breath is slightly improved, but still has shortness of breath after returning from trip to the bathroom, and has some mild chest pressure at that time also.  The swelling in her LE is about the same to her.    Objective      Physical Exam  Recent vital signs reviewed and notable for:  BP: (124-136)/(60-89)   Temp:  [35.9 C (96.6 F)-36.6 C (97.9 F)]   Temp src:  [-]   Heart Rate:  [82-95]   Resp:  [16-18]   SpO2:  [94 %-96 %]     General Constitutional:   In no apparent distress  HEENT: sclera anicteric, MMM, no erythema or exudates  Cardiovascular:  RRR, no murmurs, rubs, gallops  Pulmonary:  Clear to auscultation bilaterally  Gastrointestinal: soft, obese, nontender, nondistended, +BS  Neurologic:  Alert and oriented  Extremities:  2+ pitting edema bilateral lower extremities    Recent Lab, Micro, and Imaging Studies   Personally reviewed and notable for:      Lab results: 06/13/13  0421   Sodium 144   Potassium 4.0   Chloride 107   CO2 23   UN 16   Creatinine 0.72   GFR,Caucasian 87   GFR,Black 100   Glucose 110*   Calcium 8.5*         Lab results: 06/13/13  0421   WBC 9.6   Hemoglobin 11.5   Hematocrit 37   RBC 4.0   Platelets 306     Assessment   [name] is a 68 yo woman with a PMH of CAD s/p CABG, asthma, diet controlled type II DM, morbid obesity, PE and DVT who presented with dyspnea on exertion, dry cough, and chest  discomfort. Differential includes asthma given that her symptoms improved with Duonebs and had worsened after returning home without continuing allergy injections; unstable angina given chest pressure occurs at rest and is similar to the pain she experienced with her prior MI. PE is unlikely as she has been compliant with coumadin and her INR is therapeutic.     Plan     Chest Pain: Her SOB could be an anginal equivalent and the chest pain/pressure occuring at rest is concerning for unstable angina, especially since it is identical to the pain she experienced with her prior MI.   -Continue Atorvastatin, Plavix and Metoprolol. Hold aspirin as she is already on Warfarin/Plavix.   -trops x 3 <0.01  -telemetry unremarkable  -Given her high risk, stress test today     Asthma: Though she reports uncontrolled allergies, she is compliant with her medications, there is no indication of a viral/bacterial infection which might  be provoking an asthma exacerbation, and no exam findings to support bronchospasm, but it could explain her dry cough.  -Continue Montelukast 10 mg daily, Symbicort, continue albuterol prn, Loratadine 10 mg daily.    Diabetes Mellitus: Diet controlled   -Diabetic diet, monitor with BMPs     History of PE/DVT:   -Continue home coumadin 5 mg Sun, Mon, Wed, Fri; 2.5 mg Tues, Thurs, Sat.     FEN:   F: no IVF   E: BMP daily  N: diabetic diet after stress test    DVT PPX: coumadin with therapeutic INR    CODE STATUS: Full Code    Jennette Banker, MD on 06/14/2013 at 6:01 AM    HMD Attending Addendum  I saw and evaluated the patient. I agree with the resident's findings and plan of care as documented above, which reflects my input.    # Shortness of breath/Dyspnea on exertion:  Respiratory versus cardiac, most likely due to respiratory/asthma, unless shows something revealing on stress test today.  She had influenza in late Dec/early January, and except for a few short days of symptomatic improvement, she has  had these symptoms of shortness of breath with exertion since.  She was tx with tamiflu, and steroids at that time (she thinks iv steroids, then sent home on a taper).  Has been using her inhalers more frequently since, and continued have shortness of breath. Inhalers do make her feel a little better.  On this admission, she is feeling a little better after diuresis.  --Stress test today. If positive for reversible ischemia, will consult cardiology to consider cath.  Also eval for systolic/diastolic abn  --check Resp Viral Panel  --c/w inhalers and long-acting asthma medication  --If no significant findings by stress test, then would start steroids for respiratory/asthma  --Unlikely new PE, as until today she was therapeutic, prev PE over a year ago, and no new leg swelling or calf pain  --C/w Diuresis today    # Atypical Chest Pain:  Suspect related to respiratory/asthma as above.  Trop negative x 3.  Doing stress test today. Unlikely new PE as above    # Hx PE/DVT:  INR subtherapeutic today, likley due to missing a dose of coumadin the night she was in ED.  Would start therapeutic Lovenox until INR 2-3.  Of note, per d/w pt, it seems the DVT/PE occurred during a time of illness and poor mobility in late 2013, and likely she no longer requires [facility], however will defer to PCP who is following her as we do not have access to all her records and full medical history.     # CAD s/p CABG:  C/w current medications.  Stress test today.     Case d/w Dr. Dimple Casey and Dr. Lawernce Keas MD  06/14/2013      #

## 2013-06-14 NOTE — Progress Notes (Signed)
Chart reviewed and met with patient to discuss home care choice. Declined home care services at this time.

## 2013-06-14 NOTE — Plan of Care (Signed)
Problem: Safety  Goal: Patient will remain free of falls  Outcome: Maintaining  Intervention: Assess Falls Risk  Fall risk assessed every shift. Pt low risk for falls   Intervention: Initiate falls precautions, if indicated  Precautions not indicated at this time   Intervention: Hourly visual checks  Pt visually assessed every 1-2 hours       Problem: Psychosocial  Goal: Demonstrates ability to cope with illness  Outcome: Maintaining  Intervention: Encourage verbalization of feelings/concerns/expectations  Pt encouraged to express feelings and concerns about care  Intervention: Assess Pt/Fam needs during frequent rounding  Pt's needs are assessed during hourly rounding       Problem: Impaired Gas Exchange  Goal: Patient will maintain adequate oxygenation  Outcome: Maintaining  Intervention: Assess for restlessness, anxiety and air hunger  Pt showing no signs of restlessness, anxiety or air hunger overnight   Intervention: Monitor oxygen saturation and ABGs, as indicated  SpO2 monitored every 4 hours

## 2013-06-14 NOTE — Consults (Signed)
Brief Nutrition Note    Nutrition consult triggered by RN risk screen 06/13/13 received. Pt has been NPO and off unit for stress test today. Will f/u for nutrition assessment tomorrow when pt advanced to diabetic diet.       [name], [place]  Pager [phone]

## 2013-06-15 LAB — CBC AND DIFFERENTIAL
Baso # K/uL: 0.1 10*3/uL (ref 0.0–0.1)
Basophil %: 1.1 % (ref 0.1–1.2)
Eos # K/uL: 0.2 10*3/uL (ref 0.0–0.4)
Eosinophil %: 2.5 % (ref 0.7–5.8)
Hematocrit: 38 % (ref 34–45)
Hemoglobin: 11.9 g/dL (ref 11.2–15.7)
Lymph # K/uL: 2.8 10*3/uL (ref 1.2–3.7)
Lymphocyte %: 31.1 % (ref 19.3–51.7)
MCH: 29 pg/cell (ref 26–32)
MCHC: 31 g/dL — ABNORMAL LOW (ref 32–36)
MCV: 91 fL (ref 79–95)
Mono # K/uL: 0.9 10*3/uL (ref 0.2–0.9)
Monocyte %: 9.8 % (ref 4.7–12.5)
Neut # K/uL: 5.1 10*3/uL (ref 1.6–6.1)
Platelets: 335 10*3/uL (ref 160–370)
RBC: 4.2 MIL/uL (ref 3.9–5.2)
RDW: 14.4 % (ref 11.7–14.4)
Seg Neut %: 55.5 % (ref 34.0–71.1)
WBC: 9.1 10*3/uL (ref 4.0–10.0)

## 2013-06-15 LAB — PROTIME-INR
INR: 1.7 — ABNORMAL HIGH (ref 1.0–1.2)
Protime: 18.1 s — ABNORMAL HIGH (ref 9.2–12.3)

## 2013-06-15 LAB — BASIC METABOLIC PANEL
Anion Gap: 11 (ref 7–16)
CO2: 29 mmol/L — ABNORMAL HIGH (ref 20–28)
Calcium: 8.8 mg/dL (ref 8.6–10.2)
Chloride: 100 mmol/L (ref 96–108)
Creatinine: 0.73 mg/dL (ref 0.51–0.95)
GFR,Black: 99 *
GFR,Caucasian: 86 *
Glucose: 110 mg/dL — ABNORMAL HIGH (ref 60–99)
Lab: 18 mg/dL (ref 6–20)
Potassium: 4.1 mmol/L (ref 3.3–5.1)
Sodium: 140 mmol/L (ref 133–145)

## 2013-06-15 MED ORDER — PREDNISONE 20 MG PO TABS *I*
40.0000 mg | ORAL_TABLET | Freq: Every day | ORAL | Status: DC
Start: 2013-06-15 — End: 2013-06-16
  Administered 2013-06-15 – 2013-06-16 (×2): 40 mg via ORAL
  Filled 2013-06-15 (×2): qty 2

## 2013-06-15 MED ORDER — ENOXAPARIN SODIUM 120 MG/0.8ML IJ SOSY *I*
1.0000 mg/kg | PREFILLED_SYRINGE | Freq: Two times a day (BID) | INTRAMUSCULAR | Status: AC
Start: 2013-06-16 — End: 2013-06-16
  Administered 2013-06-16: 120 mg via SUBCUTANEOUS
  Filled 2013-06-15: qty 0.8

## 2013-06-15 NOTE — Progress Notes (Signed)
Descriptive Sentence/ Reason for admission (Big Picture)   [name] is a 68yo female presented with worsening SOB at rest and with exertion since moving from [facility] 2 months ago      Active Issues/ Relevant Events (Last 24 hrs)   -reports becoming SOB with exertion, occasional strong, non-productive cough   -3 negative trops   - NS on tele, occasionally becomes sinus tachy when ambulating. Various non-sustained runs of v-tach today; provider notified, no response.   -bilateral lower extremity edema   -stress test done yesterday       To Do List   -monitor resp status   -monitor tele      Anticipatory Guidance/ DC Planning   Discharge home when medically stable.      Report given to [facility] RN

## 2013-06-15 NOTE — Progress Notes (Addendum)
Hospital Medicine Progress Note                                                Significant 24 Hour Events    No acute events overnight; stress test yesterday unremarkable    Subjective    Patient feels about the same in terms of her breathing and coughing symptoms.  The swelling in her legs has decreased slightly    Objective      Physical Exam  Recent vital signs reviewed and notable for:  BP: (107-137)/(60-75)   Temp:  [36.1 C (96.9 F)-36.7 C (98 F)]   Temp src:  [-]   Heart Rate:  [80-90]   Resp:  [15-18]   SpO2:  [93 %-94 %]     General Constitutional:   In no apparent distress  HEENT: sclera anicteric, MMM, no erythema or exudates  Cardiovascular:  RRR, no murmurs, rubs, gallops  Pulmonary:  Clear to auscultation bilaterally  Gastrointestinal: soft, obese, nontender, nondistended, +BS  Neurologic:  Alert and oriented  Extremities:  2+ pitting edema bilateral lower extremities    Recent Lab, Micro, and Imaging Studies   Personally reviewed and notable for:      Lab results: 06/15/13  0405   Sodium 140   Potassium 4.1   Chloride 100   CO2 29*   UN 18   Creatinine 0.73   GFR,Caucasian 86   GFR,Black 99   Glucose 110*   Calcium 8.8         Lab results: 06/15/13  0405   WBC 9.1   Hemoglobin 11.9   Hematocrit 38   RBC 4.2   Platelets 335     Assessment   [name] is a 68 yo woman with a PMH of CAD s/p CABG, asthma, diet controlled type II DM, morbid obesity, PE and DVT who presented with dyspnea on exertion, dry cough, and chest discomfort. Differential includes asthma given that her symptoms improved with Duonebs and had worsened after returning home without continuing allergy injections; unstable angina given chest pressure occurs at rest and is similar to the pain she experienced with her prior MI. PE is unlikely as she has been compliant with coumadin and her INR is therapeutic.     Plan     Asthma: Though she reports uncontrolled allergies, she is compliant with her medications, there is  no indication of a viral/bacterial infection which might be provoking an asthma exacerbation, and no exam findings to support bronchospasm, but it could explain her dry cough.  -Continue Montelukast 10 mg daily, Symbicort, continue albuterol prn, Loratadine 10 mg daily.  - start steroids today    Chest Pain: Her SOB could be an anginal equivalent and the chest pain/pressure occuring at rest is concerning for unstable angina, especially since it is identical to the pain she experienced with her prior MI.   -Continue Atorvastatin, Plavix and Metoprolol. Hold aspirin as she is already on Warfarin/Plavix.   -trops x 3 <0.01  -telemetry unremarkable, d/c telemetry today  - Stress test unremarkable    Diabetes Mellitus: Diet controlled   -Diabetic diet, monitor with BMPs     History of PE/DVT:   -Continue home coumadin 5 mg Sun, Mon, Wed, Fri; 2.5 mg Tues, Thurs, Sat.   -lovenox     FEN:   F: no IVF  E: BMP daily  N: diabetic diet     DVT PPX: coumadin with subtherapeutic INR; continue lovenox  -patient should discuss continuation of anticoagulation with PCP as it has been > 1 year since the event.    CODE STATUS: Full Code    Jennette Banker, MD on 06/15/2013 at 7:13 AM    HMD Attending Addendum  --LATE NOTE: I saw and examined patient earlier today and discussed case with Dr. Dimple Casey and Dr. Cannon Kettle  I saw and evaluated the patient. I agree with the resident's findings and plan of care as documented above, which reflects my input.    # Asthma  # Shortness of breath  # Atypical Chest Pain  # Hx PE/DVT  # CAD    Stress test normal, no MI by EKG/Trops.  Suspect chest pain/discomfort and SOB is from Asthma/respiratory inflammation 2/2 recent viral illness/influenza and cold exposure.  Start steroids today at 40 mg; would do taper (prob at least 40 mg x 3 days, 30 mg x 3 days, 20 mg x 3 day, 10 mg x 3 day) esp as she states she was treated with steroids for respiratory issues/ asthma x 2 in the last 1.5 months.  She will  need close follow up with PCP.  C/w other asthma medications.  Stopping diuretics as no significant improvement on them today.  C/w Current treatment for DM, PE/DVT.  C/w Lovenox bridge to coumadin, but she will need to discuss with PCP regarding stopping AC as it has been over a year since she was started on blood thinners.  If improves on steroids likely d/c in 24 - 48 hours.     Grant Ruts MD  06/15/2013

## 2013-06-15 NOTE — Consults (Signed)
Medical Nutrition Therapy - Diet Education    Writer met with pt to review dietary strategies for management of diabetes. Pt is newly diagnosed and does not want to go on insulin. Has recently been trying to "cut back" on her intake in general.  Provided pt with "Type 2 Diabetes Nutrition Therapy" handout, which emphasizes the importance of carbohydrate counting, which foods have carbohydrates, and serving sizes. Provided "Label Reading Tips for People with Diabetes" handout, which reviews how to read a nutrition label to determine portion size and number of carbohydrate servings per serving and how to adjust if consuming more than one serving. Also provided pt with "My Plate" handout, which reviews how to structure a meal for balanced carbohydrate amounts (based on USDA MyPlate tool).    Labs, meds, weight trends, and hx reviewed. Pt accompanied by supportive husband, who is also a diabetic already on insulin. All questions answered at this time and Probation officer contact information provided. Please re-consult prn if pt with changes to appetite, nutrition-related labs, or unintended weight loss. Appreciate team support.     Fredric Mare, [place]  Pager [phone]

## 2013-06-15 NOTE — Plan of Care (Signed)
Problem: Safety  Goal: Patient will remain free of falls  Outcome: Maintaining  Patient has remained free of falls since admission to the unit.  Intervention: Assess Falls Risk  Patient was initially at risk for falls due to SOB and oxygen use. But patient has been ambulatory with little to no assistance and is not on supplemental oxygen at this time. "Call, don't fall" sign is still visible in patient's room.      Problem: Psychosocial  Goal: Demonstrates ability to cope with illness  Outcome: Maintaining  Patient expressed an understanding of her over all disease process and current issues for hospitalization. Patient has not been depressed.    Problem: Impaired Gas Exchange  Goal: Patient will maintain adequate oxygenation  Outcome: Progressing towards goal  Intervention: Assess for restlessness, anxiety and air hunger  Patient has not had any c/o SOB during this shift.   Intervention: Monitor oxygen saturation and ABGs, as indicated  Oxygen saturation is measured q4 hours with vitals and PRN.

## 2013-06-16 LAB — BASIC METABOLIC PANEL
Anion Gap: 11 (ref 7–16)
CO2: 25 mmol/L (ref 20–28)
Calcium: 8.3 mg/dL — ABNORMAL LOW (ref 8.6–10.2)
Chloride: 106 mmol/L (ref 96–108)
Creatinine: 0.68 mg/dL (ref 0.51–0.95)
GFR,Black: 105 *
GFR,Caucasian: 91 *
Glucose: 121 mg/dL — ABNORMAL HIGH (ref 60–99)
Lab: 20 mg/dL (ref 6–20)
Potassium: 4.1 mmol/L (ref 3.3–5.1)
Sodium: 142 mmol/L (ref 133–145)

## 2013-06-16 LAB — PROTIME-INR
INR: 1.8 — ABNORMAL HIGH (ref 1.0–1.2)
Protime: 19.7 s — ABNORMAL HIGH (ref 9.2–12.3)

## 2013-06-16 MED ORDER — ENOXAPARIN SODIUM 120 MG/0.8ML IJ SOSY *I*
120.0000 mg | PREFILLED_SYRINGE | Freq: Two times a day (BID) | INTRAMUSCULAR | Status: DC
Start: 2013-06-16 — End: 2014-02-21

## 2013-06-16 MED ORDER — PREDNISONE 10 MG PO TABS *I*
ORAL_TABLET | ORAL | Status: AC
Start: 2013-06-16 — End: 2013-06-27

## 2013-06-16 MED ORDER — ENOXAPARIN SODIUM 120 MG/0.8ML IJ SOSY *I*
1.0000 mg/kg | PREFILLED_SYRINGE | Freq: Two times a day (BID) | INTRAMUSCULAR | Status: DC
Start: 2013-06-16 — End: 2013-06-16
  Filled 2013-06-16: qty 0.8

## 2013-06-16 NOTE — Progress Notes (Signed)
Discharge paperwork reviewed with pt.  All questions answered and pt verbalized understanding.  Scripts sent to pharmacy and social voucher submitted to cover co-pay.  Awaiting their arrival to the floor.  IV removed.  Ensured pt has documented belongings in her possession.  Pt's husband on his way to transport pt home.

## 2013-06-16 NOTE — Discharge Summary (Signed)
Name: Hesper Venturella MRN: [id number] DOB: [birth year]     Admit Date: 06/12/2013   Date of Discharge: 06/16/2013    Discharge Attending Physician: Grant Ruts      Hospitalization Summary    CONCISE NARRATIVE: Chelci Wintermute is a 68 yo woman with a PMH of CAD s/p CABG, asthma, diet controlled type II DM, morbid obesity, PE and DVT who presented with dyspnea on exertion, dry cough, and chest discomfort. Given her recent CABG last year hr SOB and substernal chest discomfort were concerning for possible angina. She underwent a SPECT test which was not notable for any inducible ischemia. Her blood cultures, viral swab and UA were all negative for infection. She reports a recent history of influenza in December requiring hospitalization and prednisone taper for subsequent asthma exacerbation. Recent viral infection plus recent environmental change from [place] to [place] winters with daily ambulation outside were thought to be likely cause of her recurrent asthma exacerbation. She was started on PO prednisone 40 mg with improvement in her DOE. She was discharged home on a prednisone taper. Of note patient presented subtherapeutic on coumadin for a provoked DVT/PE while hospitalized over 1 year ago during her CABG. She has never been told she has a hypercoagulable state. She was covered with therapeutic Lovenox during her stay and prescribed 2 additional days of coverage as her INR was subtherapeutic at 1.8 on discharge.     She should follow up with her PCP to discuss discontinuation of her warfarin, given she had been adequately treated for her provoked DVT, continued on home coumadin until that time.       CARDIAC TESTING: SPECT: Myocardial perfusion SPECT imaging demonstrates: (1) Normal rest and vasodilator stress induced myocardial perfusion scan with no evidence of ischemia. (3) No prior study is available for comparison. FUNCTION: ECG-gated SPECT myocardial wall motion study shows: (1) Normal  biventricular size and performance.  (2) Mild LV hypertrophy is noted.   PENDING BLOOD RESULTS: INR check on 06/18/13  SIGNIFICANT MED CHANGES: Yes  Prednisone taper: 40 mg x2 additional days, followed by 30 mg, 20 mg and 10 mg reduction in dose every 3 days.     Therapeutic Lovenox BID x2 days      Signed: Myrtie Cruise, MD  On: 06/16/2013  at: 1:35 PM

## 2013-06-16 NOTE — Progress Notes (Signed)
Descriptive Sentence/ Reason for admission (Big Picture)   [name] is a 68yo female presented with worsening SOB at rest and with exertion since moving from [facility] 2 months ago  Active Issues/ Relevant Events (Last 24 hrs)   -reports becoming SOB with exertion, occasional strong, non-productive cough   -3 negative trops   - tele discontinued  -bilateral lower extremity edema (trace)  -stress test done yesterday unremarkable  -steroid taper  -continue with lovenox bridge coumadin  To Do List   -monitor resp status  Anticipatory Guidance/ DC Planning   Discharge home when medically stable.

## 2013-06-16 NOTE — Progress Notes (Signed)
Utilization Management    Level of Care observation      as of the date 06/13/2013      Remo Lipps, RN     Pager: [phone]

## 2013-06-16 NOTE — Discharge Instructions (Signed)
Brief Summary of Your Hospital Course (including key procedures and diagnostic test results):  You were admitted to the hospital after experiencing worsening shortness of breath with exertion. There was some concern that it was related to your heart since you felt some discomfort in your chest similar to the feeling you felt before you had CABG/stenting.  Your cardiac enzymes remained normal and a stress test was performed, which was also normal.  It was believed that your shortness of breath and dry cough were related to asthma.  Also, as you had missed a dose of warfarin the day of admission, your blood was not as thin and you were given lovenox injections to help prevent blood clots.     What to do after you leave the hospital:  Your instructions:  Continue the course of prednisone, slowly tapering down by 10 mg every few days, i.e. 40 mg x 2 more days, 30 mg for 3 days, 20 mg for 3 days, 10 mg for 3 days, then stop.  Recommend using albuterol 3 - 4 x daily for the next 2 days, then as needed.  Continue warfarin as prescribed  Continue lovenox for 2 days.  Have blood work to check INR on Friday 2/6.   Follow up with your primary care doctor on Wednesday 2/11 at 11:30 AM.    Recommended diet: cardiac diet    Recommended activity: activity as tolerated    Wound Care: none needed    If you experience any of these symptoms within the first 24 hours after discharge:Chest pain, Shortness of breath or Fever of 101 F. or greater, please call the discharge attending Dr. Grant Ruts at phone-number: [phone]    If you experience any of these symptoms 24 hours or more after discharge:Chest pain, Shortness of breath or Fever of 101 F. or greater, please follow up with your primary care physician:  Thalia Party [phone]

## 2013-06-16 NOTE — Plan of Care (Signed)
Problem: Safety  Goal: Patient will remain free of falls  Outcome: Maintaining  Intervention: Assess Falls Risk  Assess Q shift with morse scale      Problem: Psychosocial  Goal: Demonstrates ability to cope with illness  Outcome: Maintaining  Intervention: Encourage verbalization of feelings/concerns/expectations  Pt encouraged to verbalize any issues or concerns  Intervention: Assess Pt/Fam needs during frequent rounding  Needs assessed during hourly rounding      Problem: Impaired Gas Exchange  Goal: Patient will maintain adequate oxygenation  Outcome: Maintaining  Intervention: Assess for restlessness, anxiety and air hunger  Pt does not display any restlessness, anxiety or air hunger  Intervention: Monitor oxygen saturation and ABGs, as indicated  Oxygen saturation monitored Q 4 and prn and WNL's

## 2013-06-16 NOTE — Progress Notes (Signed)
Assume care for patient at 0830. Patient vss and a&ox3. Patient denies pain. Patient tolerated meds whole via mouth. Patient reviewed and received discharge paper work. Patient had no questions or concerns.

## 2013-06-16 NOTE — Progress Notes (Addendum)
Hospital Medicine Progress Note                                                Significant 24 Hour Events    No acute events overnight    Subjective    Patient reports she was walking around yesterday, feels her shortness of breath has improved. She still has a cough causing some chest discomfort.  She denies any worsening symptoms.  She has not noted any increased swelling in her legs.    Objective      Physical Exam  Recent vital signs reviewed and notable for:  BP: (102-133)/(59-69)   Temp:  [35.7 C (96.3 F)-36.9 C (98.4 F)]   Temp src:  [-]   Heart Rate:  [80-89]   Resp:  [18-20]   SpO2:  [92 %-94 %]     General Constitutional:   In no apparent distress  HEENT: sclera anicteric, MMM, no erythema or exudates  Cardiovascular:  RRR, no murmurs, rubs, gallops  Pulmonary:  Clear to auscultation bilaterally  Gastrointestinal: soft, obese, nontender, nondistended, +BS  Neurologic:  Alert and oriented  Extremities:  Trace edema bilateral lower extremities    Recent Lab, Micro, and Imaging Studies   Personally reviewed and notable for:      Lab results: 06/15/13  0405   Sodium 140   Potassium 4.1   Chloride 100   CO2 29*   UN 18   Creatinine 0.73   GFR,Caucasian 86   GFR,Black 99   Glucose 110*   Calcium 8.8         Lab results: 06/15/13  0405   WBC 9.1   Hemoglobin 11.9   Hematocrit 38   RBC 4.2   Platelets 335     Assessment   [name] is a 68 yo woman with a PMH of CAD s/p CABG, asthma, diet controlled type II DM, morbid obesity, PE and DVT who presented with dyspnea on exertion, dry cough, and chest discomfort. Differential includes asthma given that her symptoms improved with Duonebs and had worsened after returning home without continuing allergy injections; unstable angina given chest pressure occurs at rest and is similar to the pain she experienced with her prior MI, but stress test was normal    Plan     Asthma: Though she reports uncontrolled allergies, she is compliant with her  medications, there is no indication of a viral/bacterial infection which might be provoking an asthma exacerbation, and no exam findings to support bronchospasm, but it could explain her dry cough.  Most likely asthma worsened by cold weather, change in environment and lack of complete recovery after viral illness 1 month ago.  -Continue Montelukast 10 mg daily, Symbicort, continue albuterol prn, Loratadine 10 mg daily.  - Plan for extended steroid taper: 40 mg x 3 days, 30mg  x 3 days, etc.    Chest Pain: with unremarkable stress test, likely related to asthma  -Continue Atorvastatin, Plavix and Metoprolol. Hold aspirin as she is already on Warfarin/Plavix.   -trops x 3 <0.01  - Stress test unremarkable    Diabetes Mellitus: Diet controlled   -Diabetic diet, monitor with BMPs     History of PE/DVT:   -Continue home coumadin 5 mg Sun, Mon, Wed, Fri; 2.5 mg Tues, Thurs, Sat.   -lovenox     FEN:   F:  no IVF   E: BMP daily  N: diabetic diet     DVT PPX: coumadin with subtherapeutic INR; continue lovenox  -patient should discuss continuation of anticoagulation with PCP as it has been > 1 year since the event.    CODE STATUS: Full Code    Dispo: possible d/c today.    Jennette Banker, MD on 06/16/2013 at 6:13 AM    HMD Attending Addendum  --LATE NOTE:  I saw and examined pt on rounds this AM, case d/w Dr. Dimple Casey and Dr. Cannon Kettle throughout the day  I saw and evaluated the patient. I agree with the resident's findings and plan of care as documented above, which reflects my input.    # Asthma   # Shortness of breath   # Hx PE/DVT   # CAD      Patient feeling better since starting steroids with decreased SOB.  No chest pain today.  Stable for discharge and will discharge on steroid taper (40 mg x 3 days, 30 mg x 3 days, 20 mg x 3 day, 10 mg x 3 day).  She will need to follow up with her PCP.  INR still subtherapeutic (missed one dose of coumadin while in ED awaiting admission), but increasing toward therapeutic.  She will  discharge on Lovenox, and have INR checked in 2 days.  Rest of plan per Dr. Zada Girt MD  06/16/2013

## 2013-06-16 NOTE — Progress Notes (Signed)
Requested to arrange for Lovenox assistance program with [facility]- spoke with patient who confirmed inability to pay- application filled out per protocol    I can be reached at [phone] for any questions.

## 2013-06-23 LAB — EKG 12-LEAD
P: 18 degrees
QRS: 59 degrees
Rate: 89 {beats}/min
Severity: NORMAL
Severity: NORMAL
T: 37 degrees

## 2013-06-25 ENCOUNTER — Telehealth: Payer: Self-pay

## 2013-06-25 NOTE — Telephone Encounter (Signed)
Patient calling to schedule a NPV appointment with Dr.Petronaci per her PCP Dr.Kiriakidi request. Corrine states that her diagnosis is asthma. I offered [facility] the next available, but March was too far out for her. Please contact Ernestine back at [phone] to discuss and schedule. Thank you!!

## 2013-06-30 DIAGNOSIS — J449 Chronic obstructive pulmonary disease, unspecified: Secondary | ICD-10-CM | POA: Insufficient documentation

## 2013-06-30 DIAGNOSIS — R000 Tachycardia, unspecified: Secondary | ICD-10-CM | POA: Insufficient documentation

## 2013-06-30 HISTORY — DX: Chronic obstructive pulmonary disease, unspecified: J44.9

## 2013-07-02 ENCOUNTER — Telehealth: Payer: Self-pay

## 2013-07-09 ENCOUNTER — Telehealth: Payer: Self-pay

## 2013-07-09 NOTE — Telephone Encounter (Signed)
Spoke with [name], she will push 04/12/13, 05/07/13 and 05/09/13 CXRs to [facility].    Patient is scheduled NPV with Dr. Tamala Julian on 07/19/13.

## 2013-07-19 ENCOUNTER — Ambulatory Visit: Payer: Self-pay

## 2013-07-19 ENCOUNTER — Encounter: Payer: Self-pay | Admitting: Gastroenterology

## 2013-07-19 ENCOUNTER — Encounter: Payer: Self-pay | Admitting: Pulmonology

## 2013-07-19 ENCOUNTER — Ambulatory Visit: Payer: Self-pay | Admitting: Pulmonology

## 2013-07-19 VITALS — BP 127/90 | HR 93 | Temp 98.2°F | Resp 18 | Ht 67.7 in | Wt 305.0 lb

## 2013-07-19 DIAGNOSIS — G4733 Obstructive sleep apnea (adult) (pediatric): Secondary | ICD-10-CM

## 2013-07-19 DIAGNOSIS — J45909 Unspecified asthma, uncomplicated: Secondary | ICD-10-CM

## 2013-07-19 DIAGNOSIS — Z9989 Dependence on other enabling machines and devices: Secondary | ICD-10-CM

## 2013-07-19 MED ORDER — TIOTROPIUM BROMIDE MONOHYDRATE 18 MCG IN CAPS *I*
18.0000 ug | ORAL_CAPSULE | Freq: Every day | RESPIRATORY_TRACT | Status: DC
Start: 2013-07-19 — End: 2014-02-21

## 2013-07-19 NOTE — Progress Notes (Signed)
Dear [name], [name] was seen at your request in Pulmonary Clinic at the [facility] on 07/19/2013 in consultation for asthma.     HPI     As you know [name] is a 68 y.o. female patient with PMH listed below who reports today that she was diagnosed with asthma when she was 68 years old.  Her triggers include cat dander, cats, dogs, and pollen.  She has been on Ventolin inhaler in the past.  She did not have problems with her asthma from the age of 59 until 3.  During the time between age 4 and now she lived in [place] and [place] and when she was living in [place] she received allergy shots and Symbicort.  In [place] she didn't use Symbocrt at first and was having problems. Once the Symbicort was restarted her asthma was well controlled.     [name] returned to [place] in August.  She was on Symbicort and Ventolin and she was okay until November when she found she had no exercise tolerance.  In December she had a cough and was hospitalized with influenza.  After that hospitalization she was on Advair (now 500) and nebulizers.  She has also had 4 courses of prednisone since August.     She saw her primary doctor in January and February and was started on Singulair at night.      At this visit, [name] reports that her dyspnea is continuous but she is not symptomatic at night.  She also has OSA and uses CPAP. She was diagnosed with mild OSA when she was in [place] about 5 years ago.  Over the years her sleep apnea has worsened and her weight has increased 40 pounds since the original sleep study.      She has CAD and is s/p CABG in 2013.  She has a regular cardiologist Trilby Leaver) and she had a cath last week and her grafts were patent.  She had a stress test in February that showed normal biventricular size and function.     She cannot think of any provokers.   She has no pets, has hard wood floors, no perfumes or cigarette  smoke.  The cold can be an issue.     She has a cough that started a couple of weeks before the flu.  She has reflux disease and takes pantoprazole.  The head of bed is not elevated.  She eats 2 hours before bed.  She does not drink coffee or alcohol but she does drink carbonated beverages, and eats acid foods such as citrus and tomatoes.         Past Medical History   Diagnosis Date    CAD (coronary artery disease)     PE (pulmonary embolism)     DVT (deep venous thrombosis)     Arthritis     Diabetes mellitus      diet controlled         Past Surgical History   Procedure Laterality Date    Coronary artery bypass graft      Appendectomy      Tonsillectomy and adenoidectomy      Cholecystectomy      Cyst removal       both thumbs         Family History   Problem Relation Age of Onset    Other Mother      "rare  lung disease"    Aneurysm Mother      abdominal aortic aneurysm    Stroke Father     Stroke Brother          History     Social History    Marital Status: Married     Spouse Name: N/A     Number of Children: N/A    Years of Education: N/A     Occupational History    Not on file.     Social History Main Topics    Smoking status: Never Smoker     Smokeless tobacco: Not on file    Alcohol Use: No    Drug Use: No    Sexual Activity: Not on file     Other Topics Concern    Not on file     Social History Narrative    No narrative on file         Review of Systems    An 11-point ROS of systems was obtained and is referable to the HPI; otherwise negative.  I reviewed the Patient Questionnaire with  [name] and it is available in the chart. She  is cvurrent with immunizations.    Current Medications    Outpatient medications were reconciled.  Pulmonary medications include Advair and nebulizers once a day at night.      Allergies   Allergen Reactions    Morphine Hives    Penicillins Hives     On my exam, [name] was 68 inches tall and weighed 305 pounds for a BMI of 46.76 kg/(m^2).   Blood pressure 127/90, pulse 93, temperature 36.8 C (98.2 F), temperature source Temporal, resp. rate 18, height 1.72 m (5' 7.7"), weight 138.347 kg (305 lb), SpO2 94 %. No acute distress. HEENT exam notable for anicteric sclera.  Tympanic membranes visualized bilaterally; no effusions. Nasal turbinates patent bilaterally with normal mucosa and no polyps.  Oropharynx clear with no evidence of thrush. There was no palpable cervical or supraclavicular adenopathy. Cardiac tones were normal with no murmur, gallop or rub.  Lung exam revealed decreased air entry bilaterally with no rales, rhonchi or wheezing.  Abdomen was soft and nontender with no palpable masses.  There was no cyanosis, clubbing or edema.  Skin was clear.  There were no joint effusions.  She was alert and oriented and had a grossly nonfocal neurological exam.      Results    Spirometry performed at this visit revealed a reduced FEV1/FVC ratio of 63, with a moderately reduced FEV1 of 1.46 liters (53% of predicted) and a moderately reduced FVC of 2.30 liters (64% of predicted).  Spirometry is consistent with moderate obstructive lung disease.     Impression and Recommendations: [name] is a 68 y.o. female patient with asthma and OSA.   She is also morbidly obese which is likely contributing to her dyspnea.  She uses her CPAP but it sounds like the settings need to be retitrated.     At this time my recommendations include:    1.  Continue Advair.  I added Spiriva.  2.  Repeat sleep study.  3.  Weight loss and exercise.  4.  Follow up in 4 months with spirometry.    Thank you for allowing me to participate in the care of  [name].   Please do not hesitate to contact me if you have any questions or comments.       Sincerely,    Shadia Larose-Lynn  Tamala Julian, MD  Associate Professor of Medicine  Pulmonary and Critical Care

## 2013-07-19 NOTE — Patient Instructions (Signed)
--  You need to lose weight.  I talked with you about a book called The Fast Diet by Jess Barters (? Spelling).  You might want to check it out on line and perhaps getting the book  --I am adding Spiriva to your asthma regimen -- so use Advair twice a day (rinse and spit after use); Spiriva once a day in the morning; nebulizers as needed and your Ventolin as needed.   --Think about the [employer] and doing water work out there  --We need to address your CPAP pressure.  I will send you for a repeat study.  --About your reflux, please elevate the head of bed about 3-4 inches and then please try to limit your carbonated beverages, citrus, acid foods  --I will see you in 4 months for follow up

## 2013-08-16 ENCOUNTER — Telehealth: Payer: Self-pay

## 2013-08-16 NOTE — Telephone Encounter (Signed)
Patient is scheduled and confirmed for split night/CPAP sleep study on 4/17 at [place].    No prior auth required - [employer] / [place] / [phone]

## 2013-08-16 NOTE — Progress Notes (Signed)
[facility]              TEST ORDER FORM    PATIENT:  [name]    MRN:  [id number]      DOB:   1946/05/03    STUDY DATE:  4.17.2015  FOLLOW-UP DATE:  Visit date not found     STUDY TYPE:  Split Night   Room # :     SPECIAL INSTRUCTIONS:  own bathroom    Sleep MD:   D/R_Petronaci PCP:          Thalia Party    HEIGHT:   5'8" WEIGHT:   305 lbs ESS:   obtain NECK:   obtain "    AHI:   unk LOW O2:   unk    Prior [facility] Visit Data / Other Sleep Notes:   Previously diagnosed with mild OSA in [place] about 5 years ago; CPAP user, needs retitration/weight gain.       Insurance Info:  MEDICARE [place], [employer], [id number]   Allergies   Allergen Reactions    Morphine Hives    Penicillins Hives       Patient Active Problem List   Diagnosis Code    Dyspnea on exertion 786.09    Asthma 493.90

## 2013-08-17 ENCOUNTER — Telehealth: Payer: Self-pay

## 2013-08-17 DIAGNOSIS — G4733 Obstructive sleep apnea (adult) (pediatric): Secondary | ICD-10-CM

## 2013-08-17 NOTE — Telephone Encounter (Signed)
[facility] FORM    Patient Name:  [name]  MRN #:  [id number]  D.O.B.:  30-Jan-1946  Sex:    female  Height:   5 ft  7 in  Weight:  305 lbs    Date of Study:   08/27/2013  Study Type: SPLIT    Referring MD: Tamala Julian        PCP:   Thalia Party     [phone]    Req & Orders Info:  Order completed under procedure tab     Problem List:   Patient Active Problem List   Diagnosis Code    Dyspnea on exertion 786.09    Asthma 493.90       Medications:   Current Outpatient Prescriptions on File Prior to Visit   Medication Sig Dispense Refill    tiotropium (SPIRIVA) 18 MCG inhalation capsule Inhale 1 capsule (18 mcg total) into the lungs daily  30 capsule  3    aspirin 81 MG tablet Take 81 mg by mouth daily        pantoprazole (PROTONIX) 40 MG EC tablet Take 40 mg by mouth daily   Swallow whole. Do not crush, break, or chew.        loratadine (KLS ALLERCLEAR) 10 MG tablet Take 10 mg by mouth daily        docusate sodium (RA COL-RITE) 250 MG capsule Take 250 mg by mouth daily        Albuterol (VENTOLIN IN) Inhale into the lungs        enoxaparin (LOVENOX) 120 MG/0.8ML injection Inject 1 Syringe (120 mg total) into the skin 2 times daily  5 Syringe  0    fluticasone-salmeterol (ADVAIR DISKUS) 500-50 MCG/DOSE diskus inhaler Inhale 1 puff into the lungs 2 times daily        montelukast (SINGULAIR) 10 MG tablet Take 10 mg by mouth nightly        isosorbide mononitrate (IMDUR) 30 MG 24 hr tablet Take 30 mg by mouth daily        atorvastatin (LIPITOR) 10 MG tablet Take 10 mg by mouth daily (with dinner)        docusate sodium (COLACE) 100 MG capsule Take 200 mg by mouth daily        warfarin (COUMADIN) 5 MG tablet Take 5 mg by mouth daily   2.5 Tues, Thurs, Sat. Takes 5 mg on the other days        clopidogrel (PLAVIX) 75 MG tablet Take 75 mg by mouth daily        metoprolol (LOPRESSOR) 25 MG tablet Take 12.5 mg by mouth 2 times daily            No  current facility-administered medications on file prior to visit.       Allergies:   Allergies   Allergen Reactions    Morphine Hives    Penicillins Hives         Patient Employer:  none  Employment Status:  retired    Usual Sleep/RiseTimes:  10-11pm to 8am   Usual Work Hours:         none    Sleep Problem/Reason for Referral:  History of OSA, dx 5 yrs ago in [place], has used CPAP      Tech Comments / Special Needs: Pt used CPAP until about 2 weeks ago, Machine was dropped and broke.  Pt uses FF mask,  pt couldn't remember what the settings were. Pt says her mask is old and worn out.    History Call Date = 4.8.15    By:  Grant Fontana III

## 2013-08-17 NOTE — Telephone Encounter (Signed)
08/27/13 - SPLIT - PETRONACI - NEEDS SPLIT ORDERS/HISTORY CALL

## 2013-08-19 NOTE — Telephone Encounter (Signed)
This Direct Referral sleep study is approved

## 2013-08-25 ENCOUNTER — Telehealth: Payer: Self-pay

## 2013-08-25 NOTE — Telephone Encounter (Signed)
Sleep study conf call- Pt requests sleep study cancelled, She was quoted a price too high for the sleep study and is applying for some financial aide.  She was told that she is likely to be approved for the assistance. The process takes about a month. She will call to [facility] once the funding is promised.

## 2013-08-27 ENCOUNTER — Ambulatory Visit: Payer: Self-pay

## 2013-10-08 ENCOUNTER — Encounter: Payer: Self-pay | Admitting: Gastroenterology

## 2013-11-01 ENCOUNTER — Encounter: Payer: Self-pay | Admitting: Pulmonology

## 2013-11-01 ENCOUNTER — Ambulatory Visit: Payer: Self-pay | Admitting: Pulmonology

## 2013-11-01 VITALS — BP 130/59 | HR 83 | Temp 97.9°F | Resp 18 | Ht 67.32 in | Wt 305.1 lb

## 2013-11-01 DIAGNOSIS — J302 Other seasonal allergic rhinitis: Secondary | ICD-10-CM

## 2013-11-01 DIAGNOSIS — J45909 Unspecified asthma, uncomplicated: Secondary | ICD-10-CM

## 2013-11-01 MED ORDER — FLUTICASONE PROPIONATE 50 MCG/ACT NA SUSP *I*
2.0000 | Freq: Every day | NASAL | Status: DC
Start: 2013-11-01 — End: 2015-01-08

## 2013-11-01 NOTE — Progress Notes (Signed)
Dear Doctor Lenor Coffin, Lamarr Lulas:      [name] was seen in Pulmonary Clinic on 11/01/2013 in follow up for shortness of breath, morbid obesity, asthma and OSA.   As you know, Mrs. [name] is a 68 y.o. female patient  who at her last visit I added Spiriva and recommended a repeat sleep study.     Since our last correspondence, [name] reports she tried a sample of the Spiriva but did not realize a benefit and she could not afford it so she has not used it.  She was also hospitalized at [facility] about 1 month ago with shortness of breath and lower extremity edema.  She said she was checked for blood clots in her legs and her heart was checked out and "everything was fine."     [name] wakes frequently during the night because she is choking.  She is sleepy during the day.   She did not have the sleep study because of the expense.   Her Epworth Sleepiness Score is 15.    She continues to have shortness of breath and leg and back pain when she walks.  She is using Flovent instead of Advair and thinks it is helping.  She is also using albuterol 3-4 times a day.   She is also bothered by seasonal allergies and uses OTC antihistamine.     Her weight is stable.     Medications were reconciled.  Pulmonary medications include Spiriva (which she is not using), Flovent and albuterol.     On my exam,Blood pressure 130/59, pulse 83, temperature 36.6 C (97.9 F), temperature source Temporal, resp. rate 18, height 1.71 m (5' 7.32"), weight 138.392 kg (305 lb 1.6 oz), SpO2 96 %.  No acute distress. HEENT exam notable for anicteric sclera.  Oropharynx clear with no evidence of thrush. There was no palpable cervical or supraclavicular adenopathy. Cardiac tones were normal with no murmur, gallop or rub.  Lung exam revealed good air entry bilaterally with no rales, rhonchi or wheezing.  Abdomen was soft and nontender with no palpable masses.  There was bilateral lower extremity edema. She was alert and oriented and  had a grossly nonfocal neurological exam.        Impression and Recommendations: [name] is a 68 y.o. female patient with dyspnea/asthma, morbid obesity and likely OSA.  Her care is compromised by financial constraints.  I am most concerned that she has undiagnosed obstructive sleep apnea and I have encouraged her to find a way to get the sleep study.  She also has seasonal allergies.     At this time my recommendations include:    1.  Sleep study.  2.  Continue Flovent and albuterol.   3.  Weight loss.   4.  I prescribed Flonase for her seasonal allergies.   6.  Follow up with me in 6 months with spirometry.      Thank you for allowing me to continue in the care of [name].  Please do not hesitate to contact me if you have any questions or comments.       Sincerely,    Maurine Simmering, MD  Pulmonary and Critical Care Medicine

## 2013-11-01 NOTE — Patient Instructions (Signed)
Sleep study  Continue Flovent and albuterol   Antihistamine for allergies  I prescribed a nasal steroid for your allergies  Follow up in 6 months with breathing test

## 2013-11-02 ENCOUNTER — Telehealth: Payer: Self-pay

## 2013-11-02 NOTE — Progress Notes (Signed)
[place]              TEST ORDER FORM    PATIENT:  [name]    MRN:  [id number]      DOB:   1945/11/16    STUDY DATE:  6.29.2015  FOLLOW-UP DATE:  Visit date not found     STUDY TYPE:  Split Night   Room # :     SPECIAL INSTRUCTIONS:  Own bathroom    Sleep MD:   D/R_Petronaci PCP:          Thalia Party    HEIGHT:   5'7" WEIGHT:   305 lbs ESS:   obtain NECK:   obtain "    AHI:   unk LOW O2:   unk    Prior [facility] Visit Data / Other Sleep Notes:   Previously diagnosed with mild OSA in [place] about 5 years ago; CPAP used, needs retitration/weight gain.          Insurance Info:  MEDICARE [place], [employer], [id number]   Allergies   Allergen Reactions    Morphine Hives    Penicillins Hives       Patient Active Problem List   Diagnosis Code    Dyspnea on exertion 786.09    Asthma 493.90

## 2013-11-02 NOTE — Telephone Encounter (Signed)
Left voice message for patient with split night/CPAP sleep study appointment information:  Monday, 11/08/13 at 7:30 pm  [place]  [address] / no prior auth required  (Appt rescheduled from 08/27/13 cancel)

## 2013-11-04 ENCOUNTER — Telehealth: Payer: Self-pay

## 2013-11-04 NOTE — Telephone Encounter (Signed)
Confirmed.

## 2013-11-08 ENCOUNTER — Ambulatory Visit: Payer: Self-pay

## 2013-11-08 VITALS — Ht 67.0 in | Wt 305.0 lb

## 2013-11-08 DIAGNOSIS — G4733 Obstructive sleep apnea (adult) (pediatric): Secondary | ICD-10-CM

## 2013-11-08 NOTE — Progress Notes (Signed)
[facility]             [facility] SPLIT SUMMARY FORM    Name:  [name]  MRN:   [id number] DOB:   Dec 08, 1945    ID Verification: yes  Photo ID: yes  Epworth Score:   19  Neck Circumference:   18.75"      Study Date:  11/08/2013 Study Type:  SPLIT    Room #: 5    SSDC MD: D/R    Referring MD:  Tamala Julian    PCP:  Thalia Party    Tech:  Arvella Merles RPSGT, APT    Technician Summary:  Sleep onset ~ 15 min, pt achieved N1, N2 during NPSG portion; slept exclusively supine. Pt exhibited frequnet periods of freqeunt to repeated hypops of 18- 30 seconds, 3- 9% dsats, occl arousal - event- arousal pattern, mod raspy recovery snores, frequent RRLMs. Pt qualified for split to CPAP with 30 resp events within the first hour.    Additional Tech Comments:  No bathroom breaks. TV on through sleep onset, remained on through switch to CPAP per pt request. Pt's glasses on until switch to CPAP.  Supplemental 02:  NO    Positional Differences:  Pt slept supine only.    HR  High = 84   Low = 68   Avg = low - mid 70s  02  High = 97   Low = 86   Avg = 92- 93    Event Types:     Arousals: Spontaneous and Resp. Related  Occasional and Frequent        Snoring: Raspy, Recovery, Mild and Moderate Occasional and Frequent        Leg Mvmt: Resp. Related Frequent        EKG Abnoms: None obs'd None        EEG Abnorms:  None obs'd  None        Sleep Hygiene: TV ON THROUGH NPSG, INTO START OF CPAP AT PT REQUEST Occasional     Patient Comments:  None    PAP SECTION:    Mask Style & Size:  MEDIUM  RESMED AIRFIT F10    Acclimation Data & Pt. Tolerance:  Pt had used FFM during previous CPAP use, pt reports breathing through mouth due to constant nasal congestion. Pt acclimated to the Airfit F10 for 8- 10 min at 5cm, EPR to 3, while upright in chair. Heat set to 72, lowered from default of 80 per pt request, preferred cooler humidification during previous CPAP use.    Technician Summary:  Sleep onset ~ 17 min post CPAP  initiation, TV on through sleep re-onset, TV off, possibly on timer early. Pt achieved all stages, slept predominantly supine, briefly on left side. Titration started at 5cm, early raises through 9cm during NEM for hypops, early snores to minimal resp events. Post REM onset, pt still supine,pt  developed hypops, periods of dsats to mid 80s: pressure raises through 14cm, pt to minimal resp events after arousing from REM to NREM. Pt moved briefly to left side, but exhibited fragmented N1/ N , developing elevated mask leaks possibly due to head/ mask against pillow. Upon tech in room to adjust for eventul excessive leaks, pt returned to supine for remainder of study, again achieving REM, developing occl hypops with further later raises through 16, then 17cm to minimal events in NREM / supine.    Additional Tech Comments:  No bathroom breaks.  EPR: Yes  Setting: 3     Humidifier: Yes  Type:    heat  Setting: auto      Chin Strap: N/A Auto Climate Control:  Yes Tubing Temp:  72    CPAP Pressure Settings:   Starting Pressure =  5 cmH2O     Final Pressure =  16 cmH2O    Optimal Pressure =      16 cmH2O    BiPAP Pressure Settings:  N/A    ASV Pressure Settings:  N/A    Supplemental 02:    NO    Positional Differences:  Pt slept almost exclusively supine, brief fragmented sleep while  Left Lateral.  HR  High = 92   Low = 64   Avg = low 70s  02  High = 99   Low = 74   Avg = ~92    Event Types @ Optimal Pressure:     Arousals: Spontaneous and Resp. Related  Isolated and Occasional        Snoring: Audible inhalations Isolated        Leg Mvmt: Spontaneous and Resp. Related Isolated        EKG Abnoms: None obs'd None        EEG Abnorms:  Alpha intrusion  Occasional        Sleep Hygiene: TV ON THROUGH CPAP INITIATION Isolated     Patient Comments:  None    Arvella Merles  RPSGT, APT

## 2013-11-08 NOTE — Progress Notes (Signed)
[place]       BEDTIME QUESTIONNAIRE    Patient name: [name]  MRN # : [id number]    Emergency Contact: Wille Glaser (Spouse)  Contact Phone #: [phone]    What is your usual bedtime?   11:00 p.m. - 12:00 a.m.  What is your usual risetime?  8:30 - 10:00 a.m.  What time did you wake up today? 9:00 a.m.  How many hours of sleep last night/day? 8 hours  Any naps taken today?   Yes 5:00 - 5:15 pm  Any alcohol or caffeine intake since noon? No    Requested Good PM:  10:45 p.m.  Requested Good AM:  5:45 a.m.  Shower in AM:   yes  AM Transportation:  Spouse pick up at 6:30 am    Medications:  Current Outpatient Prescriptions on File Prior to Visit   Medication Sig Dispense Refill    Fluticasone Propionate  HFA (FLOVENT HFA IN) Inhale into the lungs 2 times daily        fluticasone (FLONASE) 50 MCG/ACT nasal spray 2 sprays by Nasal route daily  16 g  3    tiotropium (SPIRIVA) 18 MCG inhalation capsule Inhale 1 capsule (18 mcg total) into the lungs daily  30 capsule  3    aspirin 81 MG tablet Take 81 mg by mouth daily        pantoprazole (PROTONIX) 40 MG EC tablet Take 40 mg by mouth daily   Swallow whole. Do not crush, break, or chew.        loratadine (KLS ALLERCLEAR) 10 MG tablet Take 10 mg by mouth daily        docusate sodium (RA COL-RITE) 250 MG capsule Take 250 mg by mouth daily        Albuterol (VENTOLIN IN) Inhale into the lungs        montelukast (SINGULAIR) 10 MG tablet Take 10 mg by mouth nightly        isosorbide mononitrate (IMDUR) 30 MG 24 hr tablet Take 30 mg by mouth daily        atorvastatin (LIPITOR) 10 MG tablet Take 10 mg by mouth daily (with dinner)        docusate sodium (COLACE) 100 MG capsule Take 200 mg by mouth daily        clopidogrel (PLAVIX) 75 MG tablet Take 75 mg by mouth daily        metoprolol (LOPRESSOR) 25 MG tablet Take 12.5 mg by mouth 2 times daily           enoxaparin (LOVENOX) 120 MG/0.8ML injection Inject 1 Syringe (120 mg total) into the skin  2 times daily  5 Syringe  0    fluticasone-salmeterol (ADVAIR DISKUS) 500-50 MCG/DOSE diskus inhaler Inhale 1 puff into the lungs 2 times daily        warfarin (COUMADIN) 5 MG tablet Take 5 mg by mouth daily   2.5 Tues, Thurs, Sat. Takes 5 mg on the other days         No current facility-administered medications on file prior to visit.       Medications Taking During Study:  None, took evening meds prior to arrival  Allergies:  Allergies   Allergen Reactions    Morphine Hives    Penicillins Hives       Latex Allergy:   No    Food Allergy:   No    History of chronic pain?: Yes Back pain  Do you  have any pain or discomfort at this time? no  Pain Scale: 0  Location: N/A    Prosthetics/Pacemaker: Yes glasses  Sinus/Congestion Issues: Yes constant sinus congestion    Measures to improve patient comfort level:  Water or Juice    CPAP Patients Only:  Presently using CPAP/BIPAP? no, not for some time, machine broke  Pressure Setting: 9? cmH2O    Using supplemental oxygen? no   Duration of Use:  N/A    Current HomeCare company:  TBD    Arvella Merles RPSGT, APT

## 2013-11-09 NOTE — Progress Notes (Signed)
[facility]  PATIENT MORNING CPAP QUESTIONNAIRE    Patient name: [name]  MRN # : [id number]    1)   How long did it take to fall asleep? 30 minutes    2)   How long do you feel you slept?  6 hours    3)  How many times do you remember waking up?  3    Describe your sleep last night:      About the same as my usual sleep No     OR     MORE CONTINUOUS    When I went to sleep, the air pressure felt: TOLERABLE    When I woke up, the air pressure felt: TOLERABLE    There was: occasional mask leakage     I feel congested: Slightly    I am: Willing to try CPAP at home.     Please indicate the overall comfort of the CPAP mask by placing an "X" on the horizontal line in the regard to your comfort level:    [-------X----------------------------------------------------------------------------------------------------]   Comfortable        Uncomfortable    We hope your stay was comfortable.  Please enter any additional comments you may have regarding your sleep study experience.        Demetria Pore Oliver

## 2013-11-10 NOTE — Procedures (Addendum)
This is a split night polysomnogram report. The first two hours of the study were used as a diagnostic polysomnogram (PSG), the balance of the recording time was used as a CPAP titration study.    During the polysomnogram portion of the recording she had evidence of severe obstructive sleep apnea.  During this section of the recording she only achieved Stages 1 and 2 of sleep. Her apnea hypopnea index (AHI) was 89.2 respiratory events per hour of sleep. Her baseline oxygen saturation during this section of the recording was 90.8%.  Her oxygen saturation nadir was 86%.    During the second part of the recording she was tested on CPAP therapy.  CPAP settings from 5-17 CM of H2O were tested.  During periods of non-REM sleep her obstructive sleep apnea was effectively treated at a setting of 12-13 CM of H2O.  During periods of REM sleep she required higher CPAP settings of 17 CM of H2O, to effectively eliminate her sleep disordered breathing.    This document was electronically signed by Larene Pickett, MD on 11/11/2013 at 4:35 PM.

## 2013-11-11 DIAGNOSIS — G4733 Obstructive sleep apnea (adult) (pediatric): Secondary | ICD-10-CM | POA: Insufficient documentation

## 2013-11-11 HISTORY — DX: Obstructive sleep apnea (adult) (pediatric): G47.33

## 2013-11-11 NOTE — Progress Notes (Signed)
[facility] - Split Night Polysomnogram Report    Dear Dr. Tamala Julian,    [name] underwent a split night polysomnogram at [facility] on November 08, 2013.    The first two hours of the study were used as a diagnostic polysomnogram (PSG), the balance of the recording time was used as a CPAP titration study.    During the polysomnogram portion of the recording she had evidence of severe obstructive sleep apnea.  During this section of the recording she only achieved Stages 1 and 2 of sleep. Her apnea hypopnea index (AHI) was 89.2 respiratory events per hour of sleep. Her baseline oxygen saturation during this section of the recording was 90.8%.  Her oxygen saturation nadir was 86%.    During the second part of the recording she was tested on CPAP therapy.  CPAP settings from 5-17 CM of H2O were tested.  During periods of non-REM sleep her obstructive sleep apnea was effectively treated at a setting of 12-13 CM of H2O.  During periods of REM sleep she required higher CPAP settings of 17 CM of H2O, to effectively eliminate her sleep disordered breathing.      IMPRESSION:  This study demonstrated the presence of severe obstructive sleep apnea. Her AHI was 89.2 respiratory events per hour of sleep, with oxygen saturation nadir of 86%.  He was also tested on CPAP during this study.  During REM sleep she required fairly high CPAP pressure of 17 cm of water to effectively treat her OSA, during non-REM sleep pressures of 12 cm of water were found to be effective. Given this disparity in pressure requirements, one might consider using a permanent AutoSet CPAP device with a pressure range of 10-17 cm of H20 - as this may lead to better comfort and tolerance.    Thanks for allowing me to participate in this patient's care.  Please feel free to contact me if you have any questions.       Sincerely,   Larene Pickett, MD  [place]

## 2013-11-22 ENCOUNTER — Encounter: Payer: Self-pay | Admitting: Gastroenterology

## 2013-11-22 ENCOUNTER — Other Ambulatory Visit: Payer: Self-pay | Admitting: Pulmonology

## 2013-11-22 DIAGNOSIS — G4733 Obstructive sleep apnea (adult) (pediatric): Secondary | ICD-10-CM

## 2013-11-22 DIAGNOSIS — Z9989 Dependence on other enabling machines and devices: Secondary | ICD-10-CM

## 2013-11-23 ENCOUNTER — Telehealth: Payer: Self-pay

## 2013-11-23 NOTE — Telephone Encounter (Signed)
Script for CPAP, patient demographics, last visit note and sleep studies faxed to [facility].

## 2014-01-12 DIAGNOSIS — I119 Hypertensive heart disease without heart failure: Secondary | ICD-10-CM | POA: Insufficient documentation

## 2014-01-26 ENCOUNTER — Encounter: Payer: Self-pay | Admitting: Gastroenterology

## 2014-01-31 ENCOUNTER — Encounter: Payer: Self-pay | Admitting: Gastroenterology

## 2014-02-19 ENCOUNTER — Observation Stay
Admission: EM | Admit: 2014-02-19 | Disposition: A | Discharge status: Home / Self Care | Payer: Self-pay | Source: Ambulatory Visit | Attending: Geriatric Medicine | Admitting: Geriatric Medicine

## 2014-02-19 ENCOUNTER — Other Ambulatory Visit: Payer: Self-pay | Admitting: Gastroenterology

## 2014-02-19 LAB — CBC AND DIFFERENTIAL
Baso # K/uL: 0.1 10*3/uL (ref 0.0–0.1)
Basophil %: 1.1 %
Eos # K/uL: 0.3 10*3/uL (ref 0.0–0.4)
Eosinophil %: 3.5 %
Hematocrit: 38 % (ref 34–45)
Hemoglobin: 12 g/dL (ref 11.2–15.7)
IMM Granulocytes #: 0 10*3/uL (ref 0.0–0.1)
IMM Granulocytes: 0.3 %
Lymph # K/uL: 2 10*3/uL (ref 1.2–3.7)
Lymphocyte %: 22.4 %
MCH: 28 pg/cell (ref 26–32)
MCHC: 32 g/dL (ref 32–36)
MCV: 88 fL (ref 79–95)
Mono # K/uL: 0.7 10*3/uL (ref 0.2–0.9)
Monocyte %: 7.4 %
Neut # K/uL: 6 10*3/uL (ref 1.6–6.1)
Nucl RBC # K/uL: 0 10*3/uL (ref 0.0–0.0)
Nucl RBC %: 0 /100 WBC (ref 0.0–0.2)
Platelets: 268 10*3/uL (ref 160–370)
RBC: 4.3 MIL/uL (ref 3.9–5.2)
RDW: 13.9 % (ref 11.7–14.4)
Seg Neut %: 65.3 %
WBC: 9.1 10*3/uL (ref 4.0–10.0)

## 2014-02-19 LAB — PLASMA PROF 7 (ED ONLY)
Anion Gap,PL: 16 (ref 7–16)
CO2,Plasma: 21 mmol/L (ref 20–28)
Chloride,Plasma: 104 mmol/L (ref 96–108)
Creatinine: 0.76 mg/dL (ref 0.51–0.95)
GFR,Black: 94 *
GFR,Caucasian: 81 *
Glucose,Plasma: 152 mg/dL — ABNORMAL HIGH (ref 60–99)
Potassium,Plasma: 4.1 mmol/L (ref 3.4–4.7)
Sodium,Plasma: 141 mmol/L (ref 132–146)
UN,Plasma: 14 mg/dL (ref 6–20)

## 2014-02-19 LAB — HOLD SST

## 2014-02-19 MED ORDER — IPRATROPIUM-ALBUTEROL 0.5-2.5 MG/3ML IN SOLN *I*
3.0000 mL | RESPIRATORY_TRACT | Status: AC
Start: 2014-02-19 — End: 2014-02-19
  Administered 2014-02-19 (×3): 3 mL via RESPIRATORY_TRACT
  Filled 2014-02-19: qty 3

## 2014-02-19 MED ORDER — IPRATROPIUM-ALBUTEROL 0.5-2.5 MG/3ML IN SOLN *I*
RESPIRATORY_TRACT | Status: AC
Start: 2014-02-19 — End: 2014-02-19
  Administered 2014-02-19: 0
  Filled 2014-02-19: qty 3

## 2014-02-19 MED ORDER — IPRATROPIUM-ALBUTEROL 0.5-2.5 MG/3ML IN SOLN *I*
RESPIRATORY_TRACT | Status: AC
Start: 2014-02-19 — End: 2014-02-19
  Administered 2014-02-19: 0 mL via RESPIRATORY_TRACT
  Filled 2014-02-19: qty 3

## 2014-02-19 NOTE — ED Provider Notes (Addendum)
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  For the past 2 weeks having gradually worsening cough and shortness of breath which has significantly worsened the past 2 days with no relief from nebulizer.  Cough is productive, clear sputum, and she developed left sided chest tightness radiating to left shoulder today.      Denies fever, nausea, vomiting.      History provided by:  Patient      Past Medical History   Diagnosis Date    CAD (coronary artery disease)     PE (pulmonary embolism)     DVT (deep venous thrombosis)     Arthritis     Diabetes mellitus      diet controlled            Past Surgical History   Procedure Laterality Date    Coronary artery bypass graft      Appendectomy      Tonsillectomy and adenoidectomy      Cholecystectomy      Cyst removal       both thumbs       Family History   Problem Relation Age of Onset    Other Mother      "rare lung disease"    Aneurysm Mother      abdominal aortic aneurysm    Stroke Father     Stroke Brother          Social History      reports that she has never smoked. She does not have any smokeless tobacco history on file. She reports that she does not drink alcohol or use illicit drugs. Her sexual activity history is not on file.    Living Situation     Questions Responses    Patient lives with Spouse    Homeless No    Caregiver for other family member No    External Services None    Employment Retired    Domestic Violence Risk           Problem List     Patient Active Problem List   Diagnosis Code    Dyspnea on exertion R06.09    Asthma J45.909    Severe Obstructive sleep apnea G47.33       Review of Systems   Review of Systems   Constitutional: Negative for fever.   Respiratory: Positive for shortness of breath.    Cardiovascular: Positive for chest pain.    Gastrointestinal: Negative for nausea, vomiting and abdominal pain.   Skin: Negative for rash.   Neurological: Negative for syncope.   Psychiatric/Behavioral: Negative for confusion.       Physical Exam       ED Triage Vitals   BP Heart Rate Heart Rate(via Pulse Ox) Resp Temp Temp Source SpO2 O2 Device O2 Flow Rate   02/19/14 2239 02/19/14 2239 02/20/14 0124 02/19/14 2239 02/19/14 2239 02/19/14 2239 02/19/14 2239 02/19/14 2239 --   203/117 mmHg 117 85 22 36.3 C (97.3 F) TEMPORAL 97 % None (Room air)       Weight           02/19/14 2239           136.079 kg (300 lb)  Physical Exam   Constitutional: She is oriented to person, place, and time. She appears well-developed and well-nourished. She appears distressed.   HENT:   Head: Normocephalic and atraumatic.   Mouth/Throat: Oropharynx is clear and moist.   Eyes: Pupils are equal, round, and reactive to light.   Cardiovascular: Regular rhythm, normal heart sounds and intact distal pulses.  Tachycardia present.  Exam reveals no gallop and no friction rub.    No murmur heard.  Pulmonary/Chest: No stridor. She is in respiratory distress. She has wheezes. She has no rales.   Abdominal: Soft. Bowel sounds are normal. She exhibits no distension. There is no tenderness. There is no rebound and no guarding.   Musculoskeletal: She exhibits edema (symmetric LE edema). She exhibits no tenderness (no calf tenderness b/l).   Neurological: She is alert and oriented to person, place, and time.   Skin: Skin is warm and dry. She is not diaphoretic.   Psychiatric: She has a normal mood and affect.   Nursing note and vitals reviewed.      Medical Decision Making      Amount and/or Complexity of Data Reviewed  Clinical lab tests: ordered  Tests in the radiology section of CPT: ordered        Initial Evaluation:  ED First Provider Contact     Date/Time Event User Comments    02/19/14 2315 ED Provider First Contact PEREIRA, JOSEPH Initial Face to Face Provider Contact           Patient seen by me on arrival date of 02/19/2014 at 2347    Assessment:  68 y.o., female with cad, h/o PE (no longer on warfarin), asthma comes to the ED with shortness of breath, wheezing, and chest pain.  EKG shows t-wave inversion inferiorly.    Differential Diagnosis includes asthma exacerbation, ACS, chf, PE, anemia, pneumonia, viral syndrome             Plan:    Cbc, bmp, bnp, troponin  xr chest, ct angio chest, ekg  Aspirin po, duonebs, solumedrol iv    cta negative for PE, it does show pulmonary nodules which need follow up ct.  She is feeling improved, repeat EKG shows t-wave inversions resolved.  Troponin negative.  Will observe for continued asthma treatment and serial troponin.      Luiz Blare, MD    Luiz Blare, MD  02/20/14 [phone]        Patient seen by me on 02/20/2014 at 12:10 AM.    History:   I reviewed this patient, reviewed the resident note and agree     Exam:    I examined this patient, reviewed the resident note and agree     Decision Making:   I discussed with the documented resident decision making and agree    Author Dalene Carrow, MD          Dalene Carrow, MD  02/27/14 [phone]

## 2014-02-19 NOTE — ED Notes (Signed)
Pt comes in hx of asthma c/o increasing SOB and cough over past couple days. Taken home neb and inhalers with minimal relief. Wheezing noted. Family at bedside. Will continue to monitor and tx per orders

## 2014-02-19 NOTE — ED Notes (Signed)
Patient comes in with worsening sob, +productive cough, denies fevers, doing home nebs without relief, hx of asthma, sob at rest but worse with exertion.

## 2014-02-20 ENCOUNTER — Other Ambulatory Visit: Payer: Self-pay | Admitting: Gastroenterology

## 2014-02-20 ENCOUNTER — Encounter: Payer: Self-pay | Admitting: Geriatric Medicine

## 2014-02-20 LAB — BASIC METABOLIC PANEL
Anion Gap: 16 (ref 7–16)
CO2: 24 mmol/L (ref 20–28)
Calcium: 9.2 mg/dL (ref 8.6–10.2)
Chloride: 102 mmol/L (ref 96–108)
Creatinine: 0.78 mg/dL (ref 0.51–0.95)
GFR,Black: 91 *
GFR,Caucasian: 79 *
Glucose: 167 mg/dL — ABNORMAL HIGH (ref 60–99)
Lab: 16 mg/dL (ref 6–20)
Potassium: 4.5 mmol/L (ref 3.3–5.1)
Sodium: 142 mmol/L (ref 133–145)

## 2014-02-20 LAB — CBC AND DIFFERENTIAL
Baso # K/uL: 0.1 10*3/uL (ref 0.0–0.1)
Basophil %: 0.9 %
Eos # K/uL: 0.1 10*3/uL (ref 0.0–0.4)
Eosinophil %: 0.6 %
Hematocrit: 40 % (ref 34–45)
Hemoglobin: 12.6 g/dL (ref 11.2–15.7)
IMM Granulocytes #: 0.1 10*3/uL (ref 0.0–0.1)
IMM Granulocytes: 0.6 %
Lymph # K/uL: 0.7 10*3/uL — ABNORMAL LOW (ref 1.2–3.7)
Lymphocyte %: 8.2 %
MCH: 28 pg/cell (ref 26–32)
MCHC: 32 g/dL (ref 32–36)
MCV: 88 fL (ref 79–95)
Mono # K/uL: 0.1 10*3/uL — ABNORMAL LOW (ref 0.2–0.9)
Monocyte %: 0.9 %
Neut # K/uL: 7.9 10*3/uL — ABNORMAL HIGH (ref 1.6–6.1)
Nucl RBC # K/uL: 0 10*3/uL (ref 0.0–0.0)
Nucl RBC %: 0 /100 WBC (ref 0.0–0.2)
Platelets: 276 10*3/uL (ref 160–370)
RBC: 4.5 MIL/uL (ref 3.9–5.2)
RDW: 14 % (ref 11.7–14.4)
Seg Neut %: 88.8 %
WBC: 8.8 10*3/uL (ref 4.0–10.0)

## 2014-02-20 LAB — TROPONIN T
Troponin T: <0.01 ng/mL (ref 0.00–0.02)
Troponin T: <0.01 ng/mL (ref 0.00–0.02)
Troponin T: <0.01 ng/mL (ref 0.00–0.02)

## 2014-02-20 LAB — POCT GLUCOSE: Glucose POCT: 196 mg/dL — ABNORMAL HIGH (ref 60–99)

## 2014-02-20 LAB — NT-PRO BNP: NT-pro BNP: 53 pg/mL (ref 0–900)

## 2014-02-20 MED ORDER — IPRATROPIUM-ALBUTEROL 0.5-2.5 MG/3ML IN SOLN *I*
3.0000 mL | Freq: Four times a day (QID) | RESPIRATORY_TRACT | Status: DC
Start: 2014-02-20 — End: 2014-02-21
  Administered 2014-02-20 – 2014-02-21 (×6): 3 mL via RESPIRATORY_TRACT
  Filled 2014-02-20 (×6): qty 3

## 2014-02-20 MED ORDER — ATORVASTATIN CALCIUM 40 MG PO TABS *I*
40.0000 mg | ORAL_TABLET | Freq: Every day | ORAL | Status: DC
Start: 2014-02-20 — End: 2014-02-21
  Administered 2014-02-20: 40 mg via ORAL
  Filled 2014-02-20: qty 1

## 2014-02-20 MED ORDER — METHYLPREDNISOLONE SOD SUCC 125 MG IJ SOLR(62.5MG/ML) *WRAPPED*
125.0000 mg | Freq: Once | INTRAMUSCULAR | Status: AC
Start: 2014-02-20 — End: 2014-02-20
  Administered 2014-02-20: 125 mg via INTRAVENOUS
  Filled 2014-02-20: qty 2

## 2014-02-20 MED ORDER — IOHEXOL 350 MG/ML (OMNIPAQUE) IV SOLN *I*
70.0000 mL | Freq: Once | INTRAVENOUS | Status: AC
Start: 2014-02-20 — End: 2014-02-20
  Administered 2014-02-20: 70 mL via INTRAVENOUS

## 2014-02-20 MED ORDER — DOCUSATE SODIUM 100 MG PO CAPS *I*
200.0000 mg | ORAL_CAPSULE | Freq: Two times a day (BID) | ORAL | Status: AC
Start: 2014-02-20 — End: 2014-02-21
  Administered 2014-02-20 – 2014-02-21 (×3): 200 mg via ORAL
  Filled 2014-02-20 (×3): qty 2

## 2014-02-20 MED ORDER — ENOXAPARIN SODIUM 40 MG/0.4ML IJ SOSY *I*
40.0000 mg | PREFILLED_SYRINGE | Freq: Every day | INTRAMUSCULAR | Status: DC
Start: 2014-02-20 — End: 2014-02-20

## 2014-02-20 MED ORDER — ASPIRIN 81 MG PO TBEC *I*
81.0000 mg | DELAYED_RELEASE_TABLET | Freq: Every day | ORAL | Status: DC
Start: 2014-02-20 — End: 2014-02-21
  Administered 2014-02-20 – 2014-02-21 (×2): 81 mg via ORAL
  Filled 2014-02-20 (×2): qty 1

## 2014-02-20 MED ORDER — ISOSORBIDE MONONITRATE CR 30 MG PO TB24 *I*
30.0000 mg | ORAL_TABLET | Freq: Every day | ORAL | Status: DC
Start: 2014-02-20 — End: 2014-02-21
  Administered 2014-02-20 – 2014-02-21 (×2): 30 mg via ORAL
  Filled 2014-02-20 (×2): qty 1

## 2014-02-20 MED ORDER — LORATADINE 10 MG PO TABS *I*
10.0000 mg | ORAL_TABLET | Freq: Every day | ORAL | Status: DC
Start: 2014-02-20 — End: 2014-02-21
  Administered 2014-02-20 – 2014-02-21 (×2): 10 mg via ORAL
  Filled 2014-02-20 (×2): qty 1

## 2014-02-20 MED ORDER — INFLUENZA VAC SPLIT QUAD (FLULAVAL) 0.5 ML IM SUSY PF(>=6 MONTHS) *I*
0.5000 mL | PREFILLED_SYRINGE | INTRAMUSCULAR | Status: DC
Start: 2014-02-20 — End: 2014-02-21
  Filled 2014-02-20: qty 0.5

## 2014-02-20 MED ORDER — ASPIRIN 81 MG PO CHEW *I*
324.0000 mg | CHEWABLE_TABLET | Freq: Once | ORAL | Status: AC
Start: 2014-02-20 — End: 2014-02-20
  Administered 2014-02-20: 324 mg via ORAL
  Filled 2014-02-20: qty 4

## 2014-02-20 MED ORDER — ENOXAPARIN SODIUM 40 MG/0.4ML IJ SOSY *I*
40.0000 mg | PREFILLED_SYRINGE | Freq: Two times a day (BID) | INTRAMUSCULAR | Status: DC
Start: 2014-02-20 — End: 2014-02-21
  Administered 2014-02-20 – 2014-02-21 (×2): 40 mg via SUBCUTANEOUS
  Filled 2014-02-20 (×4): qty 0.4

## 2014-02-20 MED ORDER — METOPROLOL TARTRATE 12.5 MG PO CAPS *I*
12.5000 mg | ORAL_CAPSULE | Freq: Two times a day (BID) | ORAL | Status: DC
Start: 2014-02-20 — End: 2014-02-21
  Administered 2014-02-20 – 2014-02-21 (×3): 12.5 mg via ORAL
  Filled 2014-02-20 (×3): qty 1

## 2014-02-20 MED ORDER — ACETAMINOPHEN 325 MG PO TABS *I*
650.0000 mg | ORAL_TABLET | ORAL | Status: DC | PRN
Start: 2014-02-20 — End: 2014-02-21
  Administered 2014-02-20: 650 mg via ORAL
  Filled 2014-02-20: qty 2

## 2014-02-20 MED ORDER — PREDNISONE 20 MG PO TABS *I*
40.0000 mg | ORAL_TABLET | Freq: Every day | ORAL | Status: DC
Start: 2014-02-20 — End: 2014-02-21
  Administered 2014-02-20 – 2014-02-21 (×2): 40 mg via ORAL
  Filled 2014-02-20 (×2): qty 2

## 2014-02-20 MED ORDER — CLOPIDOGREL BISULFATE 75 MG PO TABS *I*
75.0000 mg | ORAL_TABLET | Freq: Every day | ORAL | Status: DC
Start: 2014-02-20 — End: 2014-02-21
  Administered 2014-02-20 – 2014-02-21 (×2): 75 mg via ORAL
  Filled 2014-02-20 (×2): qty 1

## 2014-02-20 MED ORDER — MONTELUKAST SODIUM 10 MG PO TABS *I*
10.0000 mg | ORAL_TABLET | Freq: Every evening | ORAL | Status: DC
Start: 2014-02-20 — End: 2014-02-21
  Administered 2014-02-20: 10 mg via ORAL
  Filled 2014-02-20 (×3): qty 1

## 2014-02-20 NOTE — ED Notes (Signed)
Vital signs stable. 

## 2014-02-20 NOTE — ED Notes (Signed)
Plan of Care     Tele. Nebs. CPAP. Peak Flow.Lovenox. Steroid Meds. POCT Glucose. Meds,Labs,Tests,Imaging, Consults per MD.

## 2014-02-20 NOTE — ED Notes (Signed)
Pt states she is feeling better than she has in a long time. Complaining of dry cough. Overall NAD. AOX3. Denies pain, dizziness, SOB, nausea, emesis, changes in bowel/bladder habits. Peak flow avg 250 over 3 attempts. Reviewed plan of care. Bedside table / call light w/i reach.  Tele in place. IV flushed/patent/swab-capped. Will continue to monitor.

## 2014-02-20 NOTE — ED Notes (Signed)
Patient is resting comfortably. 

## 2014-02-20 NOTE — ED Notes (Signed)
Pt arrived to OBS via stretcher. NAD. AOX3. Ambulated without concerns to bed from stretcher and to/from bathroom. Complaining of chest tightness, SOB, DOE and cough. Denies pain, dizziness, nausea, changes in bowel/bladder habits. IV flushed & patent. Tele placed. Oriented patient to room/unit. Reviewed plan of care. Bedside table / call light w/i reach.  Will continue to monitor.

## 2014-02-20 NOTE — ED Notes (Signed)
Plan of Care     Meds/labs; pain assessment/management; tele; DM diet; up ad lib; VS per protocol; comfort measures as needed. Will continue to monitor.

## 2014-02-20 NOTE — ED Obs Notes (Signed)
ED OBSERVATION PROGRESS NOTE    Patient seen by me today, 02/20/2014 at 11:30 AM    Current patient status: Observation    Chief Complaint:   Chief Complaint   Patient presents with    Shortness of Breath       Subjective:  68 year old female with history of CAD s/p CABG (in 2013 in [place]), DVT/PE (post surgery in 2013, off anticoagulation x 6 months) who presented to the ED with shortness of breath. Patient states that over the past 1 month she has been experiencing exertional shortness of breath and chest heaviness/pressure. However, she reports that over the past 1 week she has developed cough productive of clear sputum and increased shortness of breath that also occurs at rest. Denies fevers/chills. Reports short term relief in her symptoms with home inhaler.    Had normal nuclear stress test 06/2013 and reports having an angiogram this year at [facility] with Dr. Kerin Ransom.    Nursing Pain Score:  Last Nursing documented pain:  0-10 Scale: 0 (02/20/14 1041)      Vitals: Patient Vitals for the past 24 hrs:   BP Temp Temp src Pulse Resp SpO2 Height Weight   02/20/14 1041 147/85 mmHg 36 C (96.8 F) TEMPORAL 82 18 94 % - -   02/20/14 0709 156/86 mmHg 36.1 C (97 F) TEMPORAL 84 20 93 % - -   02/20/14 0124 126/87 mmHg 36.4 C (97.5 F) TEMPORAL 91 20 94 % - -   02/19/14 2239 (!) 203/117 mmHg 36.3 C (97.3 F) TEMPORAL (!) 117 22 97 % 1.702 m (5\' 7" ) 136.079 kg (300 lb)       Physical Examination:  Physical Exam   Constitutional: No distress.   HENT:   Head: Normocephalic and atraumatic.   Cardiovascular: Normal rate and regular rhythm.    Pulmonary/Chest: Effort normal and breath sounds normal. No respiratory distress. She has no wheezes.   Coughing throughout exam   Abdominal: Soft. There is no tenderness.   Musculoskeletal: She exhibits edema (trace LE edema bilaterally).   Neurological: She is alert.   Nursing note and vitals reviewed.      RWE:[id number] EKG reveals sinus tachycardia, rate 109, TWI lead III, aVF  Last  EKG reveals NSR without acute ischemic changes    Lab Results:     Recent Labs  Lab 02/20/14  0600   WBC 8.8   HEMOGLOBIN 12.6   HEMATOCRIT 40   PLATELETS 276       Recent Labs  Lab 02/20/14  0600   SODIUM 142   POTASSIUM 4.5   CO2 24   UN 16   CREATININE 0.78   GLUCOSE 167*   CALCIUM 9.2     Trop < 0.01 x 2  NT-BNP 53    Ct Angio Chest  The examination is adequate for determining the presence of pulmonary  emboli.     1. No pulmonary thromboembolism.   2. A 6 x 4 mm sized the left lower lobe nodule. (If the patient is at  high-risk for lung cancer, 6-12 month followup CT is recommended. If  at low risk, 12 followup CT is recommended.)     Portable Chest Standard Ap Single View  No acute cardiopulmonary disease.       Nuclear Stress (06/14/13)  CONCLUSIONS: PERFUSION: Myocardial perfusion SPECT imaging   demonstrates: (1) Normal rest and vasodilator stress induced myocardial   perfusion scan with no evidence of ischemia. (3) No prior study is  available for comparison.   FUNCTION: ECG-gated SPECT myocardial wall motion study shows: (1)   Normal biventricular size and performance. (2) Mild LV hypertrophy is   noted. (3) No prior study is available for comparison.     Assessment: 68 year old female with history of CAD s/p CABG (in 2013 in [place]), DVT/PE (post surgery in 2013, off anticoagulation x 6 months) who presented to the ED with shortness of breath. Reports 1 month history of DOE and chest heaviness, however, developed productive cough and worsening shortness of breath over the past 1 week. Per previous notes, patient noted to have wheezing on exam. Patient has been afebrile, O2 sat 93-94% on RA this morning, CXR negative for pna, recent Nuclear stress test 06/2013.    Plan:   1. Dyspnea  -continue prednisone, duoneb and singulair  -continue to monitor peak flows and ambulatory O2 saturations  -continue to trend trops/EKGs  -continue telemetry  -will ask for cardiology consultation given 1 month history of DOE  and chest pressure, as well as significant cardiac history    2. CAD  -continue lipitor, asa, plavix, metoprolol, imdur     3. DVT/PE  -CT angio negative for acute PE  -lovenox while here    Medically preferred DVT prophylaxis: LMWH (BID lovenox dosing discussed with pharmacy)    Author: Crista Luria, PA  Note created: 02/20/2014  at: 11:30 AM    Supervising physician Dr. Andria Rhein was immediately available

## 2014-02-20 NOTE — ED Notes (Signed)
Peak Flow 210.

## 2014-02-20 NOTE — Progress Notes (Signed)
Utilization Management    Level of Care Observation service as of the date 02/20/2014    Pt notified of observation LOC; verbalized understanding of notification.  Pt provided with Utilization Mgmt contact information for any further questions or concerns.  Acknowledgement receipt given to patient and copy placed in chartlet.          Loreal Schuessler     Pager: [phone]

## 2014-02-20 NOTE — ED Notes (Signed)
Ambulatory O2 Sat = 95 % RA HR 99

## 2014-02-20 NOTE — ED Notes (Signed)
Plan of Care     Meds/labs - including breathing treatments; pain assessment/management; tele; serial trops / ekg; reg diet; up ad lib; VS per protocol;  comfort measures as needed. Will continue to monitor.

## 2014-02-20 NOTE — ED Notes (Signed)
Family at bedside. 

## 2014-02-20 NOTE — ED Notes (Signed)
Patient denies pain and is resting comfortably.  

## 2014-02-20 NOTE — ED Obs Notes (Signed)
ED OBSERVATION FOLLOW-UP NOTE    Patient:  [name]    Patient re-evaluated  States she is feeling a little better, continues with cough    + scattered expiratory wheezes  O2 sat 96% on RA (just finished neb treatment)  Peak flow 250    Repeat BP 147/69  Patient states that her BP is typically 115/80s, states her metoprolol was cut in half about 1 year ago due to low BPs-will continue to monitor her blood pressure and hold on starting diuretic and patient was encouraged to follow up as an outpatient regarding this  Patient was seen by cardiology, no further cardiac testing recommended  Will continue current therapy and re-evaluate in the morning     Author: Crista Luria, PA  Note created: 02/20/2014  at: 5:53 PM

## 2014-02-20 NOTE — ED Obs Notes (Signed)
ED OBSERVATION ADMISSION NOTE    Patient seen by me today, 02/20/2014 at 4:00am    Current patient status: Observation    History     Chief Complaint   Patient presents with    Shortness of Breath     HPI Comments: 68yo F with hx of HLD, CAD s/p CABG, Asthma, OSA on CPAP, and obesity, placed in OBS following eval in the ED for complaints of SOB. States wheezing and SOB seems to be worsening for past 3-4 weeks, tonight was particularly severe, prompting ED visit. States she has been using her albuterol inhaler at home only, as she is unable to afford her spiriva or advair for the past several months. In addition to her SOB she also reports L sided Chest pressure, which she states she has had since her CABG in 2013, intermittently. States she has been worked up for her heart recently including LHC and Nuc stress with her cardiologist, Dr Kerin Ransom, and states everything "looks good". She endorses improved symptoms after solumedrol and nebs in the ED. She denies prior hx of intubation. She states she has very infrequent asthma exacerbations. She is placed in OBS for ongoing care.       History provided by:  Patient and medical records      Past Medical History   Diagnosis Date    CAD (coronary artery disease)     PE (pulmonary embolism)     DVT (deep venous thrombosis)     Arthritis     Diabetes mellitus      diet controlled       Past Surgical History   Procedure Laterality Date    Coronary artery bypass graft      Appendectomy      Tonsillectomy and adenoidectomy      Cholecystectomy      Cyst removal       both thumbs       Family History   Problem Relation Age of Onset    Other Mother      "rare lung disease"    Aneurysm Mother      abdominal aortic aneurysm    Stroke Father     Stroke Brother        Social History      reports that she has never smoked. She does not have any smokeless tobacco history on file. She reports that she does not drink alcohol or use illicit drugs. Her sexual activity history  is not on file.    Living Situation     Questions Responses    Patient lives with Spouse    Homeless No    Caregiver for other family member No    External Services None    Employment Retired    Curator Violence Risk           Review of Systems   Review of Systems   Constitutional: Negative for fever, chills, diaphoresis, appetite change and fatigue.   HENT: Negative for congestion, facial swelling, sore throat and trouble swallowing.    Eyes: Negative for photophobia and visual disturbance.   Respiratory: Positive for chest tightness, shortness of breath and wheezing.    Cardiovascular: Positive for chest pain. Negative for palpitations and leg swelling.   Gastrointestinal: Negative for nausea, vomiting and abdominal pain.   Genitourinary: Negative for dysuria, urgency and frequency.   Musculoskeletal: Negative for back pain and gait problem.   Skin: Negative for color change, pallor and wound.   Neurological: Negative for  dizziness, syncope, facial asymmetry, speech difficulty, weakness, light-headedness and numbness.   Hematological: Negative.        Physical Exam   BP 126/87 mmHg   Pulse 91   Temp(Src) 36.4 C (97.5 F) (Temporal)   Resp 20   Ht 1.702 m (5\' 7" )   Wt 136.079 kg (300 lb)   BMI 46.98 kg/m2   SpO2 94%    Physical Exam   Constitutional: She is oriented to person, place, and time. She appears well-developed and well-nourished. No distress.   Obese     HENT:   Head: Normocephalic and atraumatic.   Mouth/Throat: Oropharynx is clear and moist.   Eyes: Conjunctivae are normal. No scleral icterus.   Neck: Neck supple.   Cardiovascular: Normal rate, regular rhythm and intact distal pulses.    No murmur heard.  Pulmonary/Chest: Effort normal. No respiratory distress. She has decreased breath sounds in the right lower field and the left lower field. She has no wheezes. She exhibits no tenderness.   Abdominal: Soft. Bowel sounds are normal. She exhibits no distension. There is no tenderness.    Musculoskeletal: Normal range of motion. She exhibits no edema.   Neurological: She is alert and oriented to person, place, and time.   Skin: Skin is warm and dry. No erythema.   Psychiatric: She has a normal mood and affect.       Tests     Labs:   WBC 9  Hct 38  Plt 268    Na 141, K 4.1, Cl 104, C02 21, BUN 14, cre 0.76, Glu 156    Trop < 0.01  BNP 53        Imaging:Ct Angio Chest    02/20/2014   IMPRESSION:   The examination is adequate for determining the presence of pulmonary  emboli.   1. No pulmonary thromboembolism. 2. A 6 x 4 mm sized the left lower lobe nodule. (If the patient is at  high-risk for lung cancer, 6-12 month followup CT is recommended. If  at low risk, 12 followup CT is recommended.)   END REPORT    * Portable Chest Standard Ap Single View    02/20/2014   IMPRESSION:   No acute cardiopulmonary disease.   END REPORT     I have personally reviewed the image(s) and the resident's  interpretation and agree with or edited the findings.        Medical Decision Making        Assessment:    68yo F with hx of HLD, CAD s/p CABG, Asthma, OSA on CPAP, prior PE, not currently anticoagulated, and obesity, placed in OBS following eval in the ED for complaints of SOB consistent with asthma exacerbation. She also reports L sided chest pain, although has had a recent thoroughly cardiac workup with her primary cardiologist, lessening the likelihood of cardiac source of her pain. She endorses improved symptoms after solumedrol and nebs in the ED. She denies prior hx of intubation. She states she has very infrequent asthma exacerbations. CT angio negative for PE. She is placed in OBS for ongoing care.     Differential Diagnosis includes asthma exacerbation, doubt ACS, NSTEMI, or angina. PE excluded by CT angio, no evidence of CHF or PNA.       Plan:   1. Asthma Exacerbation  - Steroids, nebs  - Peak flow  - check ambulatory o2 qshift  - will need SW in the Am to help with affording her steroid inhalers.   -  continue Singulair and claritin    2. Chest pain with hx of CAD s/p CABG  - tele, trops, ECG  - continue statin, asa, plavix and metoprolol and Imdur  - given recent negative ST will defer further provocative testing.   - can follow up with primary cardiologist as outpt.   - continue PPI    3. OSA  - cpap at 12cm      Medically preferred DVT prophylaxis: LMWH  Smoking Cessation: NA      Arlin Sass Hermine Messick, NP    Supervising physician Dr Lenoard Aden was immediately available

## 2014-02-20 NOTE — Consults (Signed)
Cardiology Attending    Patient seen and examined.  68yo with asthma, DVT/PE, obesity,DM, HLD and CAD s/p CABG in [facility] 2013 with SVG x2, radial x 1 and subsequent stent.  Atypical CP 06/2013 with normal nuclear stress test then.  Also recath 09/2013 at [facility] with patent graft per patient report    Now presents with increased exertional dyspnea improved with inhaler, nonproductive cough and chest pressure that feels different from the left sided chest ache she had pre CABG.  Also about 40 pound weight gain in last several months.  Inactive at baseline, does not exercise    PMH/Meds:  Reviewed    In NAD but morbidly obese and coughing  Blood pressure 147/85, pulse 82, temperature 36 C (96.8 F), temperature source Temporal, resp. rate 18, height 1.702 m (5\' 7" ), weight 136.079 kg (300 lb), SpO2 94 %.  No clear rales, rare wheeze during cough  JVP not appreciably elevated  RRR without clear m/g/r  Abd obese, soft and NT  Trace edema    ECG:  NSR with mild nonspecific inferior Tw inversions  Tn -   CXR - no CHF  Chest CT reviewed  Hct 40  INR 1.8  Normal NT-pro BNP    Ass:  No evidence of significant CHF on exam or CXR.  Suspect exertional dyspnea related to combination of recent significant weight gain, inactivity/deconditioning, and asthma (improvement with inhaler) +/- superimposed mild bronchitis.  Chest pressure is atypical and with recent normal stress test and patent grafts on angio (by report) would not recommend additional cardiac evaluation at this time    Rec:   Diet, weight loss, increased activity, optimize asthma medications   Continue usual cardiac medications - may consider addition of long acting thiazide diuretic to treat HTN and mild LE edema   No additional cardiac evaluation at this time   Will sign off.  Please call if questions.

## 2014-02-21 LAB — CBC AND DIFFERENTIAL
Baso # K/uL: 0.1 10*3/uL (ref 0.0–0.1)
Basophil %: 0.4 %
Eos # K/uL: 0.1 10*3/uL (ref 0.0–0.4)
Eosinophil %: 0.9 %
Hematocrit: 36 % (ref 34–45)
Hemoglobin: 11.4 g/dL (ref 11.2–15.7)
IMM Granulocytes #: 0.1 10*3/uL (ref 0.0–0.1)
IMM Granulocytes: 0.5 %
Lymph # K/uL: 2.3 10*3/uL (ref 1.2–3.7)
Lymphocyte %: 19.7 %
MCH: 28 pg/cell (ref 26–32)
MCHC: 32 g/dL (ref 32–36)
MCV: 89 fL (ref 79–95)
Mono # K/uL: 0.8 10*3/uL (ref 0.2–0.9)
Monocyte %: 7 %
Neut # K/uL: 8.3 10*3/uL — ABNORMAL HIGH (ref 1.6–6.1)
Nucl RBC # K/uL: 0 10*3/uL (ref 0.0–0.0)
Nucl RBC %: 0 /100 WBC (ref 0.0–0.2)
Platelets: 258 10*3/uL (ref 160–370)
RBC: 4 MIL/uL (ref 3.9–5.2)
RDW: 14.2 % (ref 11.7–14.4)
Seg Neut %: 71.5 %
WBC: 11.6 10*3/uL — ABNORMAL HIGH (ref 4.0–10.0)

## 2014-02-21 LAB — BASIC METABOLIC PANEL
Anion Gap: 13 (ref 7–16)
CO2: 25 mmol/L (ref 20–28)
Calcium: 9.2 mg/dL (ref 8.6–10.2)
Chloride: 106 mmol/L (ref 96–108)
Creatinine: 0.81 mg/dL (ref 0.51–0.95)
GFR,Black: 87 *
GFR,Caucasian: 75 *
Glucose: 119 mg/dL — ABNORMAL HIGH (ref 60–99)
Lab: 22 mg/dL — ABNORMAL HIGH (ref 6–20)
Potassium: 4.2 mmol/L (ref 3.3–5.1)
Sodium: 144 mmol/L (ref 133–145)

## 2014-02-21 LAB — EKG 12-LEAD
P: 49 degrees
P: 41 degrees
P: 30 degrees
P: 24 degrees
QRS: 71 degrees
QRS: 55 degrees
QRS: 53 degrees
QRS: 50 degrees
Rate: 109 {beats}/min
Rate: 74 {beats}/min
Rate: 84 {beats}/min
Rate: 79 {beats}/min
Severity: NORMAL
Severity: NORMAL
Severity: NORMAL
Severity: NORMAL
Severity: NORMAL
Severity: NORMAL
T: -73 degrees
T: 36 degrees
T: 31 degrees
T: 36 degrees

## 2014-02-21 LAB — POCT GLUCOSE: Glucose POCT: 130 mg/dL — ABNORMAL HIGH (ref 60–99)

## 2014-02-21 MED ORDER — AZITHROMYCIN 250 MG PO TABS *I*
500.0000 mg | ORAL_TABLET | Freq: Once | ORAL | Status: AC
Start: 2014-02-21 — End: 2014-02-21
  Administered 2014-02-21: 500 mg via ORAL
  Filled 2014-02-21: qty 2

## 2014-02-21 MED ORDER — PREDNISONE 10 MG PO TABS *I*
ORAL_TABLET | ORAL | Status: DC
Start: 2014-02-22 — End: 2014-09-30

## 2014-02-21 MED ORDER — IPRATROPIUM-ALBUTEROL 0.5-2.5 MG/3ML IN SOLN *I*
3.0000 mL | Freq: Four times a day (QID) | RESPIRATORY_TRACT | Status: DC
Start: 2014-02-21 — End: 2017-02-28

## 2014-02-21 MED ORDER — AZITHROMYCIN 500 MG PO TABS *I*
250.0000 mg | ORAL_TABLET | Freq: Every day | ORAL | Status: AC
Start: 2014-02-22 — End: 2014-02-26

## 2014-02-21 MED ORDER — GUAIFENESIN-CODEINE 100-10 MG/5ML PO SYRP *I*
10.0000 mL | ORAL_SOLUTION | ORAL | Status: DC | PRN
Start: 2014-02-21 — End: 2014-09-30

## 2014-02-21 NOTE — ED Obs Notes (Signed)
ED OBSERVATION DISCHARGE NOTE    Patient seen by me today, 02/21/2014 at 8:13 AM.    Current patient status: Observation    Subjective:      Observation Stay Includes:  68 y.o.female who presented to the ED withwith history of CAD s/p CABG (in 2013 in [place]), DVT/Rhodes (post surgery in 2013, off anticoagulation x 6 months) who presented to the ED with shortness of breath. Patient states that over the past 1 month she has been experiencing exertional shortness of breath and chest heaviness/pressure. Started one month ago utilizing CPAP machine. For the past month wheezing, SOB, and chest heaviness progressively gotten worse.  Denies fevers/chills. Reports short term relief in her symptoms with home inhaler. Currently patient is feeling better than when she originally came in. Pt denies being a smoker nor being around people who smoke.  Pt previous had a DVT and was treated with Lovenox/Coumadin and currently is stabilized and is being followed by PCP.  Currently is not on treatment.  Pt was given an option to stay or go home.  Patient feels comfortable going home.     Chief Complaint   Patient presents with    Shortness of Breath       Last Nursing documented pain:  0-10 Scale: 0 (02/21/14 0555)      Vitals:  Patient Vitals for the past 24 hrs:   BP Temp Temp src Pulse Resp SpO2   02/21/14 0555 108/58 mmHg 36 C (96.8 F) TEMPORAL 73 20 97 %   02/20/14 2129 142/80 mmHg 36.2 C (97.2 F) TEMPORAL 78 18 96 %   02/20/14 1835 144/81 mmHg 35.4 C (95.7 F) TEMPORAL 92 18 95 %   02/20/14 1752 147/69 mmHg - - 96 - 96 %   02/20/14 1041 147/85 mmHg 36 C (96.8 F) TEMPORAL 82 18 94 %       Physical Exam:  Physical Exam   Constitutional: She is oriented to person, place, and time. She appears well-developed and well-nourished.   HENT:   Head: Normocephalic.   Right Ear: External ear normal.   Mouth/Throat: Oropharynx is clear and moist. No oropharyngeal exudate.   Eyes: Conjunctivae are normal.   Neck: Normal range of motion.  Neck supple. No JVD present. No tracheal deviation present. No thyromegaly present.   Pulmonary/Chest: Effort normal. No stridor. No respiratory distress. She has no wheezes.   Lung sounds decrease at the bases bilaterally, deep bronchial cough noted on exam   Abdominal: Soft. Bowel sounds are normal. She exhibits no distension and no mass. There is no tenderness. There is no rebound and no guarding.   obese   Musculoskeletal: Normal range of motion. She exhibits no edema or tenderness.   Lymphadenopathy:     She has no cervical adenopathy.   Neurological: She is alert and oriented to person, place, and time.   Skin: Skin is warm. No rash noted. No erythema. No pallor.   Psychiatric: She has a normal mood and affect. Her behavior is normal.       EKG: normal EKG, normal sinus rhythm, unchanged from previous tracings  Labs:Results for Latoya, Rhodes (MRN [id number]) as of 02/21/2014 08:32   Ref. Range 02/20/2014 06:00 02/20/2014 11:50   Troponin T Latest Range: 0.00-0.02 ng/mL <0.01 <0.01   Results for Latoya, Rhodes (MRN [id number]) as of 02/21/2014 08:32   Ref. Range 02/21/2014 06:00   Sodium Latest Range: 133-145 mmol/L 144   Potassium Latest Range: 3.3-5.1 mmol/L 4.2  Chloride Latest Range: 96-108 mmol/L 106   CO2 Latest Range: 20-28 mmol/L 25   Anion Gap Latest Range: 7-16  13   UN Latest Range: 6-20 mg/dL 22 (H)   Creatinine Latest Range: 0.51-0.95 mg/dL 0.81   GFR,Black Latest Units: * 87   GFR,Caucasian Latest Units: * 75   Glucose Latest Range: 60-99 mg/dL 119 (H)   Calcium Latest Range: 8.6-10.2 mg/dL 9.2   WBC Latest Range: 4.0-10.0 THOU/uL 11.6 (H)   RBC Latest Range: 3.9-5.2 MIL/uL 4.0   Hemoglobin Latest Range: 11.2-15.7 g/dL 11.4   Hematocrit Latest Range: 34-45 % 36   MCV Latest Range: 79-95 fL 89   MCH Latest Range: 26-32 pg/cell 28   MCHC Latest Range: 32-36 g/dL 32   RDW Latest Range: 11.7-14.4 % 14.2   Platelets Latest Range: 160-370 THOU/uL 258   Neut # K/uL Latest Range: 1.6-6.1  THOU/uL 8.3 (H)   Lymph # K/uL Latest Range: 1.2-3.7 THOU/uL 2.3   Mono # K/uL Latest Range: 0.2-0.9 THOU/uL 0.8   Eos # K/uL Latest Range: 0.0-0.4 THOU/uL 0.1   Baso # K/uL Latest Range: 0.0-0.1 THOU/uL 0.1   IMM Granulocytes # Latest Range: 0.0-0.1 THOU/uL 0.1   Nucl RBC # K/uL Latest Range: 0.0-0.0 THOU/uL 0.0   Seg Neut % Latest Units: % 71.5   Lymphocyte % Latest Units: % 19.7   Monocyte % Latest Units: % 7.0   Eosinophil % Latest Units: % 0.9   Basophil % Latest Units: % 0.4   IMM Granulocytes Latest Units: % 0.5   Nucl RBC % Latest Range: 0.0-0.2 /100 WBC 0.0   This report was requested by: Pete Glatter   Reference #: [id number]   Others' Prescriptions  Patient Name: [name] Birth Date: 1946/04/02   Address: [address] [place], [place] [address] Sex: Female     Rx Written Rx Dispensed Drug Quantity Days Supply Prescriber Name     10/08/2013 10/08/2013 tramadol hcl 50 mg tablet 12 3 Padden-Mohr, Dawn           Imaging findings: Ct Angio Chest    02/20/2014   IMPRESSION:   The examination is adequate for determining the presence of pulmonary  emboli.   1. No pulmonary thromboembolism. 2. A 6 x 4 mm sized the left lower lobe nodule. (If the patient is at  high-risk for lung cancer, 6-12 month followup CT is recommended. If  at low risk, 12 followup CT is recommended.)      * Portable Chest Standard Ap Single   02/20/2014   IMPRESSION:   No acute cardiopulmonary disease.      Cardiac Testing: Neg ACS rule out.  No changes on EKG, No ST wave changed.  NSR Rate 74 PR 204 QSRD 102 QT 420 QTc 466     Assessment: 68 year old female with history of CAD s/p CABG (in 2013 in [place]), DVT/Rhodes (post surgery in 2013, off anticoagulation x 6 months) who presented to the ED with shortness of breath. Patient states that over the past 1 month she has been experiencing exertional shortness of breath, wheezing, cough, and chest heaviness/pressure. Admitted to OBS for asthma exacerbation and ACS rule out.    Plan:    Chest  Heaviness- Resolved and Stabilized. ACS rule out and Negative, No EKG changes    SOB/Asthma Exacerbation- x 1 month progressively gotten worse with wheezing, chest heaviness,cough dry slight production.  Prednisone taper: Prednisone 40 mg daily for 3 more days then 30 mg  daily for 5 days then 20 mg daily for 5 days then 10 mg daily for 5 days then stop-Mild Hyperglycemia noted BG <200,  Pt will be on slow prednisone tapper and advised patient to watch diet avoid concentrated sweets/sugar and to follow up with PCP this week and have FSB[facility] in office.     Mucinex 600 mg twice a day for 5-7 days  Duonebs 4 times daily for now and can decrease frequency slowly once feel better  Started Zithromax on day for d/c and continue 250 mg daily for 4 days starting tomorrow  Robitussin AC 2 teaspoons at bedtime as need it for cough  Follow up with PCP and Pulmonolgist within a week      Disposition: Home  Follow-up:  within the next 2-5 days.  with  PCP and Pulmonologist  Smoking Cessation: NA    Diagnoses that have been ruled out:   None   Diagnoses that are still under consideration:   None   Final diagnoses:   Asthma exacerbation   Chest pain, unspecified chest pain type     Supervising physician [facility] was immediately available.  Author: Pete Glatter, NP  Note created: 02/21/2014  at: 8:12 AM

## 2014-02-21 NOTE — Discharge Instructions (Signed)
Brief Summary of Your Hospital Course: You were seen and treated for asthma flare up. You were given prednisone, respiratory treatment and antibiotics.  Chest scan did not show blood clot or pneumonia, you have spot on your left lower lung 6 by 4 millimeters and per your report you have had it, have your doctor monitor it.  Prednisone can increase blood sugar and it was noted that your blood sugar was mildly elevated here-avoid free sugar and concentrated sweets for now.    What to do after you leave the hospital: follow up with your doctor and Dr Linzie Collin this week    Medication changes:    Prednisone taper: Prednisone 40 mg daily for 3 more days then 30 mg daily for 5 days then 20 mg daily for 5 days then 10 mg daily for 5 days then stop  Mucinex 600 mg twice a day for 5-7 days  Duonebs 4 times daily for now and can decrease frequency slowly once feel better  Zithromax 250 mg daily for 4 days starting tomorrow  Robitussin AC 2 teaspoons at bedtime as need it for cough        Recommended diet: regular diet    Recommended activity: activity as tolerated    If you experience any of these symptoms 24 hours or more after discharge:Uncontrolled pain, Chest pain, Shortness of breath, Fever of 101 F. or greater, Chills, Poor feeding, Poor urinary output, Vomiting, Nausea, Diarrhea, Blood in stool or Weakness  Call Dr. Lenor Coffin      If you experience any of these symptoms within the first 24 hours after discharge call OBS unit provider at [phone]      Smoking    Smoking can increase your chances of developing chronic health problems or worsen conditions you already have.  If you smoke you should quit. Smoking cessation information has been given to you for your review to help you quit.  Medications to help you quit are available.  Ask your doctor if you would like to receive these medications.       Medications  Your doctor has prescribed medications to improve or manage your condition.  You should take them as  prescribed by your doctor.  Ask your doctor for any questions regarding these medications.          Diet  A healthy diet is important to help you stay well.  Some health conditions require you to be on a special diet.  You should monitor your fluid intake and limit the amount of sodium including table salt.  This will help you avoid fluid retention which can cause shortness of breath or swelling of the feet and ankles.  Reading food labels is helpful when you are on a special diet.  Follow instructions from your doctor for any other special dietary requirement.    Exercise/Activity  Activity and exercise are important to your well being.  While you are in the hospital your activity may be restricted.  As your condition improves your activity level will be increased.  Most patients will be able to gradually resume activity as before.  You should follow your doctors activity recommendations      Daily Weight  You will need to weigh yourself every day at the same time on the same scale, preferably after you empty your bladder.  If you have an increase of 3 pounds in 2 days or 5 pounds in 1 week you should contact your physician.  A daily written log of  your weights is a good way to keep track.  You should follow your doctors recommendations on monitoring your weight.     What to do if your condition changes?  Once you leave the hospital you should contact your doctors office to make a follow up appointment.  If at any time you have any questions or concerns or your condition gets worse, contact your physician.  If you can not reach your physician or you develop life threatening symptoms such as trouble breathing or chest pain you should go to the closest Emergency Department.     Education  You may receive additional information on your specific condition before you are discharged.  Please ask your nurse or doctor for your information before you leave the hospital.

## 2014-02-21 NOTE — ED Notes (Signed)
Peak flow checked pre/post nebulizer tx. 250 avg of three puffs.  Will continue to monitor.

## 2014-02-21 NOTE — ED Notes (Signed)
Plan of Care     D/C today.

## 2014-04-05 ENCOUNTER — Encounter: Payer: Self-pay | Admitting: Gastroenterology

## 2014-04-05 LAB — HM MAMMOGRAPHY

## 2014-05-25 ENCOUNTER — Ambulatory Visit: Payer: Self-pay | Admitting: Pulmonology

## 2014-05-25 ENCOUNTER — Telehealth: Payer: Self-pay

## 2014-05-25 ENCOUNTER — Encounter: Payer: Self-pay | Admitting: Pulmonology

## 2014-05-25 ENCOUNTER — Ambulatory Visit: Payer: Self-pay

## 2014-05-25 VITALS — BP 121/69 | HR 99 | Temp 95.7°F | Resp 18 | Ht 67.0 in | Wt 296.5 lb

## 2014-05-25 DIAGNOSIS — J45909 Unspecified asthma, uncomplicated: Secondary | ICD-10-CM

## 2014-05-25 DIAGNOSIS — R911 Solitary pulmonary nodule: Secondary | ICD-10-CM

## 2014-05-25 DIAGNOSIS — Z9989 Dependence on other enabling machines and devices: Secondary | ICD-10-CM

## 2014-05-25 DIAGNOSIS — G4733 Obstructive sleep apnea (adult) (pediatric): Secondary | ICD-10-CM

## 2014-05-25 MED ORDER — OXYGEN *A*
Status: DC
Start: 2014-05-25 — End: 2014-09-30

## 2014-05-25 NOTE — Progress Notes (Signed)
Dear Doctor Lenor Coffin, Lamarr Lulas:      [name] was seen in Pulmonary Clinic on 05/25/2014 in follow up for shortness of breath, morbid obesity, asthma and OSA.   As you know, [name] is a 69 y.o. female patient who was last seen in June 2015. Since her last visit she has been prescribed CPAP autoset at 10-17 cm H2O.  She was also in the hospital in October for asthma and had a chest CT that was negative for pulmonary embolism but did show a small, subcentimeter pulmonary nodule.  She has never smoked cigarettes.      Since our last correspondence, [name] reports she was seen by her primary doctor for an asthma exacerbation and she was given prednisone and an antibiotic.  She finished the prednisone on Monday.  She was having shortness of breath, wheezing, and cough.  She did not have a fever or muscle aches.  She does not have chest pain but she has occasional chest pressure.  She has been seen by Cardiology.   She did not have the seasonal influenza vaccine.  She is using Symbicort and albuterol.  She thinks she will be able to afford her medication.      [name] is using her CPAP nightly and feels more rested during the day.  She is happy that she had the study and is using CPAP and is certainly deriving a benefit.  A recent compliance report shows that she is 100% compliant and is using the machine for at least 4 hours a night.      She has lost 9 pounds since her last visit with me in June.     Medications were reconciled.  Pulmonary medications as indicated above.      On my exam,Blood pressure 121/69, pulse 99, temperature 35.4 C (95.7 F), temperature source Tympanic, resp. rate 18, height 1.702 m (5\' 7" ), weight 134.492 kg (296 lb 8 oz), SpO2 96 %.  No acute distress. HEENT exam notable for anicteric sclera.  Oropharynx clear with no evidence of thrush. There was no palpable cervical or supraclavicular adenopathy. Cardiac tones were normal with no murmur, gallop or rub.  Lung  exam revealed good air entry bilaterally with no rales, rhonchi or wheezing.  Abdomen was soft and nontender with no palpable masses.  There was trace bilateral lower extremity edema. She was alert and oriented and had a grossly nonfocal neurological exam.    Spirometry was performed at this visit and revealed a reduced FEV1/FVC ratio of 63, with a moderately reduced FEV1 of 1.46 liters (53% of predicted) and a mildly reduced FVC of 2.30 liters (64% of predicted).  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     Impression and Recommendations: [name] is a 69 y.o. female patient with dyspnea/asthma, morbid obesity and OSA.   Her spirometry is stable and she is using her CPAP.  There was a small nodule on her chest CT in October that will need follow up.      At this time my recommendations include:    1.  Continue Symbicort and albuterol and CPAP.   2.  Weight loss.   4.  She received the seasonal influenza vaccine today.    5.  Overnight oximetry on CPAP.     6.  Follow up chest CT in October (ordered).  7.  Follow up with me in 6 months with spirometry.    Thank you for  allowing me to continue in the care of [employer].  Please do not hesitate to contact me if you have any questions or comments.       Sincerely,    Maurine Simmering, MD  Pulmonary and Critical Care Medicine

## 2014-05-25 NOTE — Patient Instructions (Signed)
You are doing well.  Congratulations on losing weight.  Keep it up.  Continue Symbicort and albuterol.  Continue CPAP.  I'm going to check your oxygen level on the CPAP to make sure you don't need oxygen.  On the chest CT there was a small nodule so you will need a follow up scan in October.   You had the flu shot today.  See you in 6 months with a breathing test

## 2014-05-25 NOTE — Telephone Encounter (Signed)
Faxed over Overnight oximetry on CPAP script along with a script requesting a copy of the CPAP compliance report to [facility] office that the patient gets her supplies from.    Fax # [phone]

## 2014-05-26 NOTE — Telephone Encounter (Signed)
Fax was successfully sent on 05/25/14 at 5:32pm

## 2014-05-30 ENCOUNTER — Encounter: Payer: Self-pay | Admitting: Gastroenterology

## 2014-06-01 ENCOUNTER — Encounter: Payer: Self-pay | Admitting: Gastroenterology

## 2014-06-27 ENCOUNTER — Encounter: Payer: Self-pay | Admitting: Pulmonology

## 2014-09-30 ENCOUNTER — Other Ambulatory Visit: Payer: Self-pay | Admitting: Primary Care

## 2014-09-30 ENCOUNTER — Ambulatory Visit: Payer: Self-pay | Admitting: Primary Care

## 2014-09-30 ENCOUNTER — Encounter: Payer: Self-pay | Admitting: Primary Care

## 2014-09-30 ENCOUNTER — Telehealth: Payer: Self-pay | Admitting: Primary Care

## 2014-09-30 ENCOUNTER — Ambulatory Visit
Admit: 2014-09-30 | Discharge: 2014-09-30 | Disposition: A | Discharge status: Home / Self Care | Payer: Self-pay | Source: Ambulatory Visit | Attending: Primary Care | Admitting: Primary Care

## 2014-09-30 VITALS — BP 138/78 | HR 80 | Resp 24 | Ht 66.25 in | Wt 304.0 lb

## 2014-09-30 DIAGNOSIS — E669 Obesity, unspecified: Secondary | ICD-10-CM

## 2014-09-30 DIAGNOSIS — G4733 Obstructive sleep apnea (adult) (pediatric): Secondary | ICD-10-CM

## 2014-09-30 DIAGNOSIS — I2510 Atherosclerotic heart disease of native coronary artery without angina pectoris: Secondary | ICD-10-CM

## 2014-09-30 DIAGNOSIS — J309 Allergic rhinitis, unspecified: Secondary | ICD-10-CM | POA: Insufficient documentation

## 2014-09-30 DIAGNOSIS — J4541 Moderate persistent asthma with (acute) exacerbation: Secondary | ICD-10-CM

## 2014-09-30 DIAGNOSIS — K2970 Gastritis, unspecified, without bleeding: Secondary | ICD-10-CM

## 2014-09-30 DIAGNOSIS — R7309 Other abnormal glucose: Secondary | ICD-10-CM

## 2014-09-30 DIAGNOSIS — R911 Solitary pulmonary nodule: Secondary | ICD-10-CM

## 2014-09-30 DIAGNOSIS — R7303 Prediabetes: Secondary | ICD-10-CM | POA: Insufficient documentation

## 2014-09-30 DIAGNOSIS — J3089 Other allergic rhinitis: Secondary | ICD-10-CM

## 2014-09-30 HISTORY — DX: Allergic rhinitis, unspecified: J30.9

## 2014-09-30 LAB — PCMH FALL RISK ASSESSMENT

## 2014-09-30 MED ORDER — METHYLPREDNISOLONE 4 MG PO TABS *I*
ORAL_TABLET | ORAL | Status: DC
Start: 2014-09-30 — End: 2014-11-18

## 2014-09-30 NOTE — Telephone Encounter (Signed)
-----   Message from Verdene Lennert, MD sent at 09/30/2014  4:13 PM EDT -----  Please let her know this looks good. She's on eRecord but lost her password.

## 2014-09-30 NOTE — Telephone Encounter (Signed)
Informed patient of below. Patient expressed verbal understanding.

## 2014-09-30 NOTE — Progress Notes (Signed)
Review of Systems   (Positive items noted in bold, otherwise listed items are negative)  General: fatigue, weight change, loss of appetite, fever, night sweats, weakness  ENT: hearing difficulty, ear ringing or pain, sinus problems, nasal congestion, nosebleeds, oral lesions, dental problems, throat pain  CV: irregular heartbeat, racing heart, chest pain, leg swelling, leg pain with walking, DOE  Resp: shortness of breath, prolonged or productive cough, wheezing, pleuritic pain  GI: dysphagia, heartburn, nausea, vomiting, constipation, diarrhea, abdominal pain, blood in stool, melena, changes in bowel habits  GU: dysuria, urinary frequency or urgency, urinary incontinence, nocturia, impotence  MSK: joint pain, joint swelling, back pain, myalgias, muscle weakness  Derm: rash, itching, new or changing skin lesions, hair loss or change  Neuro: headache, vision changes, numbness, parasthesias, balance or gait difficulty, dizziness, tremor, uncontrolled movements  Psych: insomnia, irritability, depression, anxiety, mood swings, frequent anger, hallucinations  Endocrine: heat or cold intolerance, polydipsia, libido changes, menstrual irregularities, breast lumps  Heme: easy bruising or bleeding, unexplained swelling  Allergy and Immunology: itchy or watery eyes, itchy or runny nose, frequent infections

## 2014-09-30 NOTE — Progress Notes (Signed)
New Patient Visit    SUBJECTIVE  Patient here to establish care  Prior provider was: [facility], Dr. Lenor Coffin - this is closer to home  Concerns today include:     Asthma exacerbation, allergies  Having an exacerbation for the past 1.5 weeks, possibly set off by a URI   Seen five days ago, started on methylprednisone 4mg , 6 tabs x2 days, 5 pills x2 days, 4 pills daily x2 days, down to 1 daily for 2 days, then stop   However, only given 32 pills and needs 42 pills to complete this course  Requesting a referral to an allergist - previously did very well on allergy shots when she lived in [place]. Moved from there to [facility], then back to [place] four years ago to be near family.   Takes symbicort - increased to the 160mg  2 puffs BID dose by her PCP a couple months ago. Previously on advair, which didn't help as much  Takes singulair, flonase, and generic claritin daily  Also has duonebs at home, which she uses 3-4 times daily during exacerbation. Also has an albuterol inhaler   Does get short of breath with walking at baseline  Allergies are a problem all year round, worse with the change of seasons.   Does have mattress covers and a hepa filter vacuum   Notes severe asthma as a child  Followed by Dr. Tamala Julian     OSA on CPAP  Overnight oximetry showed she does not require oxygen  Using CPAP consistently, feels better on this     CAD   S/p triple CABG about three years ago  Had a catheterization and was told she needed a bypass, also required a stent as a fourth vessel could not be bypassed  Was having constant chest pain, which she still has on occasion, not sure what triggers  Taking aspirin 81mg  daily, plavix 75mg  daily, metoprolol 12.5mg  BID, atorvastatin 10mg  daily, and imdur 30mg  daily   Does not have NTG at home - doesn't need this although difficult to tell is she is having angina   Does prefer to sleep on 2 pillows,   Followed by Dr. Kerin Ransom    Records show a diagnosis of diet controlled DM - she reports  she is "borderline" diabetic. Does not check BGs at home.     Obesity - previously did well with [employer]. [facility], has recently [facility] some [facility]     Choked on a piece of meat, was told she has a small throat on EGD. Has been on protonix for a long time at this point - was told to stay on this years ago.       Patient Active Problem List   Diagnosis Code    Dyspnea on exertion R06.09    Asthma J45.909    Severe Obstructive sleep apnea G47.33    Pulmonary nodule R91.1    Allergic rhinitis J30.9    CAD (coronary artery disease) I25.10    Elevated glucose R73.09    Gastritis K29.70    Obesity E66.9      Past Medical History   Diagnosis Date    CAD (coronary artery disease)     PE (pulmonary embolism)      post-op after CABG, on anticoagulation for ~2 years    DVT (deep venous thrombosis)      post-op after CABG, on anticoagulation for ~2 years    Arthritis     Diabetes mellitus  diet controlled     Past Surgical History   Procedure Laterality Date    Coronary artery bypass graft  2013     triple     Appendectomy      Tonsillectomy and adenoidectomy      Cholecystectomy      Cyst removal       both thumbs     Family History   Problem Relation Age of Onset    Other Mother      "rare lung disease"    Aneurysm Mother      abdominal aortic aneurysm    Stroke Father     Stroke Brother      History     Social History    Marital Status: Married     Spouse Name: N/A     Number of Children: N/A    Years of Education: N/A     Occupational History    Not on file.     Social History Main Topics    Smoking status: Never Smoker     Smokeless tobacco: Not on file    Alcohol Use: No    Drug Use: No    Sexual Activity: Not on file     Other Topics Concern    Not on file     Social History Narrative    May 2016        Lives with her husband in an in law suite at her son's house        Three children, all live locally     Five grandchildren     Retired from school food service     Allergies      Allergen Reactions    Morphine Hives    Penicillins Hives       Review of Systems: Complete ROS reviewed by MD/PA and entered by nursing.    Current Outpatient Prescriptions   Medication Sig    budesonide-formoterol (SYMBICORT) 160-4.5 MCG/ACT inhaler Inhale 2 puffs into the lungs 2 times daily   Shake well before each use.    Cholecalciferol (VITAMIN D3) 5000 UNITS TABS Take by mouth daily    fluticasone (FLONASE) 50 MCG/ACT nasal spray 2 sprays by Nasal route daily    aspirin 81 MG tablet Take 81 mg by mouth daily    pantoprazole (PROTONIX) 40 MG EC tablet Take 40 mg by mouth daily   Swallow whole. Do not crush, break, or chew.    loratadine (KLS ALLERCLEAR) 10 MG tablet Take 10 mg by mouth daily    Albuterol (VENTOLIN IN) Inhale into the lungs    montelukast (SINGULAIR) 10 MG tablet Take 10 mg by mouth nightly    isosorbide mononitrate (IMDUR) 30 MG 24 hr tablet Take 30 mg by mouth daily    atorvastatin (LIPITOR) 10 MG tablet Take 10 mg by mouth daily (with dinner)    docusate sodium (COLACE) 100 MG capsule Take 200 mg by mouth daily    clopidogrel (PLAVIX) 75 MG tablet Take 75 mg by mouth daily    metoprolol (LOPRESSOR) 25 MG tablet Take 12.5 mg by mouth 2 times daily       methylPREDNISolone (MEDROL) 4 MG tablet Use as directed    ipratropium-albuterol (DUONEB) 0.5-2.5 (3) MG/3ML nebulizer solution Take 3 mLs by nebulization every 6 hours     OBJECTIVE  BP 138/78 mmHg   Pulse 80   Resp 24   Ht 1.683 m (5' 6.25")   Wt 137.893 kg (304 lb)  BMI 48.68 kg/m2   SpO2 96%  General Appearance:    Alert, cooperative, no distress, appears stated age   Head:    Normocephalic   Eyes:    Conjunctiva clear and without pallor, sclerae anicteric   Oropharynx:   Buccal mucosa normal; good dentition; oropharynx clear   Neck:   Supple, no thyromegaly   Lungs:     Distant breath sounds, no wheezing, slightly prolonged expiratory phase, air entry diminished at the bases. No rales or rhonchi.     Heart:    Regular  rate and rhythm, S1 and S2 normal, 2/6 systolic murmur loudest at the LLSB, no rub or gallop. Well healed sternotomy scar    Abdomen:     Soft, non-tender, no masses, no hepatosplenomegaly   Extremities:   Warm and well perfused, legs w/o edema   Musculoskeletal:   Palms, fingers, and nails normal to inspection   Skin:   Warm, dry, good turgor, no rashes in visible areas   Lymph nodes:   Cervical and supraclavicular nodes normal   Neurologic:   A&O x 3, normal gait   Psychiatric:   Normal mood and affect     ASSESSMENT & PLAN  1. Asthma, moderate persistent, with acute exacerbation  - ongoing dyspnea despite five days of steroids. No wheezing on exam today. Will plan for her to complete this course as previously written as per hpi but she will be in touch if her symptoms are not improving  - methylPREDNISolone (MEDROL) 4 MG tablet; Use as directed  Dispense: 20 tablet; Refill: 0  - AMB REFERRAL TO ALLERGY/IMMUNOLOGY for consideration of immunotherapy, which she has required previously  - *Chest standard frontal and lateral views; Future - given lack of response to steroids - unremarkable result today   - symbicort dose recently increased to 160mg , also on singulair, flonase, and claritin, along with duonebs that she is using q6-8 hours    2. Severe Obstructive sleep apnea - good response to CPAP     3. Pulmonary nodule - 6 x 4 mm LLL noted on CT OCt 2015, plan is for repeat in Oct 2016    4. Other allergic rhinitis - AMB REFERRAL TO ALLERGY/IMMUNOLOGY    5. CAD (coronary artery disease)  - followed by Dr. Kerin Ransom  - s/p three vessel CABG in 2013  - does have some DOE at baseline  - Comprehensive metabolic panel; Future  - TSH; Future  - CBC and differential; Future  - NT-PRO BNP; Future  - Hemoglobin A1c; Future  - Lipid add Rfx to Drt LDL if Trig >400; Future    6. Elevated glucose - Hemoglobin A1c; Future    7. Gastritis  - on 40mg  pantoprzole  - Magnesium; Future    8. Obesity - [facility]     Does think she's had a PNA shot     Pt agrees with and understands plan.    Return in about 3 months (around 12/31/2014) for f/u asthma., sooner if the current episode is not resolving. Return to office sooner prn.      Verdene Lennert, MD

## 2014-10-01 ENCOUNTER — Ambulatory Visit
Admit: 2014-10-01 | Discharge: 2014-10-01 | Disposition: A | Discharge status: Home / Self Care | Payer: Self-pay | Source: Ambulatory Visit | Attending: Primary Care | Admitting: Primary Care

## 2014-10-01 DIAGNOSIS — R7309 Other abnormal glucose: Secondary | ICD-10-CM

## 2014-10-01 DIAGNOSIS — K2970 Gastritis, unspecified, without bleeding: Secondary | ICD-10-CM

## 2014-10-01 DIAGNOSIS — I2510 Atherosclerotic heart disease of native coronary artery without angina pectoris: Secondary | ICD-10-CM

## 2014-10-01 LAB — TSH: TSH: 2.47 u[IU]/mL (ref 0.27–4.20)

## 2014-10-01 LAB — COMPREHENSIVE METABOLIC PANEL
ALT: 19 U/L (ref 0–35)
AST: 16 U/L (ref 0–35)
Albumin: 4.2 g/dL (ref 3.5–5.2)
Alk Phos: 117 U/L — ABNORMAL HIGH (ref 35–105)
Anion Gap: 12 (ref 7–16)
Bilirubin,Total: 0.2 mg/dL (ref 0.0–1.2)
CO2: 27 mmol/L (ref 20–28)
Calcium: 9.1 mg/dL (ref 8.6–10.2)
Chloride: 104 mmol/L (ref 96–108)
Creatinine: 0.82 mg/dL (ref 0.51–0.95)
GFR,Black: 85 *
GFR,Caucasian: 74 *
Glucose: 87 mg/dL (ref 60–99)
Lab: 25 mg/dL — ABNORMAL HIGH (ref 6–20)
Potassium: 4.7 mmol/L (ref 3.3–5.1)
Sodium: 143 mmol/L (ref 133–145)
Total Protein: 6.7 g/dL (ref 6.3–7.7)

## 2014-10-01 LAB — CBC AND DIFFERENTIAL
Baso # K/uL: 0.1 10*3/uL (ref 0.0–0.1)
Basophil %: 0.5 %
Eos # K/uL: 0.1 10*3/uL (ref 0.0–0.4)
Eosinophil %: 0.8 %
Hematocrit: 41 % (ref 34–45)
Hemoglobin: 12.8 g/dL (ref 11.2–15.7)
IMM Granulocytes #: 0.1 10*3/uL (ref 0.0–0.1)
IMM Granulocytes: 0.7 %
Lymph # K/uL: 3.1 10*3/uL (ref 1.2–3.7)
Lymphocyte %: 23.6 %
MCH: 28 pg/cell (ref 26–32)
MCHC: 31 g/dL — ABNORMAL LOW (ref 32–36)
MCV: 90 fL (ref 79–95)
Mono # K/uL: 0.8 10*3/uL (ref 0.2–0.9)
Monocyte %: 6.1 %
Neut # K/uL: 8.9 10*3/uL — ABNORMAL HIGH (ref 1.6–6.1)
Nucl RBC # K/uL: 0 10*3/uL (ref 0.0–0.0)
Nucl RBC %: 0 /100 WBC (ref 0.0–0.2)
Platelets: 306 10*3/uL (ref 160–370)
RBC: 4.6 MIL/uL (ref 3.9–5.2)
RDW: 13.9 % (ref 11.7–14.4)
Seg Neut %: 68.3 %
WBC: 13.1 10*3/uL — ABNORMAL HIGH (ref 4.0–10.0)

## 2014-10-01 LAB — LIPID PANEL
Chol/HDL Ratio: 3.6
Cholesterol: 175 mg/dL
HDL: 49 mg/dL
LDL Calculated: 88 mg/dL
Non HDL Cholesterol: 126 mg/dL
Triglycerides: 190 mg/dL — AB

## 2014-10-01 LAB — NT-PRO BNP: NT-pro BNP: <50 pg/mL (ref 0–900)

## 2014-10-01 LAB — MAGNESIUM: Magnesium: 2 mEq/L (ref 1.3–2.1)

## 2014-10-02 ENCOUNTER — Encounter: Payer: Self-pay | Admitting: Primary Care

## 2014-10-03 LAB — HEMOGLOBIN A1C: Hemoglobin A1C: 6.3 % — ABNORMAL HIGH (ref 4.0–6.0)

## 2014-10-04 ENCOUNTER — Encounter: Payer: Self-pay | Admitting: Primary Care

## 2014-10-11 ENCOUNTER — Telehealth: Payer: Self-pay | Admitting: Allergy and Immunology

## 2014-10-11 NOTE — Telephone Encounter (Signed)
Ms. Roseman is scheduled for a new patient visit.    Appointment scheduled with Provider: Conley Canal.   Reason for appointment. Allergic Rhinitis.  The appointment is scheduled for date: 7/8.     Please mail the new patient paperwork to address listed on file.  Patient can be reached if necessary at [phone].

## 2014-11-18 ENCOUNTER — Ambulatory Visit: Payer: Self-pay

## 2014-11-18 ENCOUNTER — Ambulatory Visit: Payer: Self-pay | Admitting: Allergy and Immunology

## 2014-11-18 ENCOUNTER — Encounter: Payer: Self-pay | Admitting: Allergy and Immunology

## 2014-11-18 VITALS — BP 164/72 | HR 89 | Temp 97.0°F | Ht 67.0 in | Wt 312.0 lb

## 2014-11-18 DIAGNOSIS — J3089 Other allergic rhinitis: Secondary | ICD-10-CM

## 2014-11-18 MED ORDER — SODIUM CHLORIDE-SODIUM BICARB 1.57 GM NA PACK
1.0000 | PACK | Freq: Every day | NASAL | 0 refills | Status: DC
Start: 2014-11-18 — End: 2019-08-06

## 2014-11-18 MED ORDER — AZELASTINE HCL 137 MCG/SPRAY NA SOLN *I*
1.0000 | Freq: Two times a day (BID) | NASAL | 6 refills | Status: DC
Start: 2014-11-18 — End: 2018-09-15

## 2014-11-18 NOTE — Patient Instructions (Signed)
Start Azelastine nasal spray up to twice per day.  Continue Flonase.  How to use your prescribed a nasal spray:   Use your nasal spray daily as directed.  It may take several days or weeks for you to feel the maximum affect.   Spray into each nostril as directed, using only a very slight sniff.  If you sniff too vigorously, the medication will end up in the back of your throat--not in your nose!  Blot any drips from your nose gently with a tissue.   Dont insert the tip of the applicator in too far into your nostrils.  The spray could puddle in the back of your nose instead of becoming a mist.   Aim the tip of the spray slightly outward towards your eye.  Pointing the tip towards the center of your nose can cause damage to the septum (the divider) in your nose.  Watch yourself in the mirror to be sure youre using your spray correctly.    Start saline nasal rinses as directed once or twice per day.  Increase your loratadine to 2 tablets daily.  When you run out of the loratadine, try 2 tablets of the cetirizine.

## 2014-11-21 NOTE — Progress Notes (Signed)
Tolerated aeroallergen allergy skin testing without any difficulty.

## 2014-11-22 NOTE — Progress Notes (Signed)
Allergy New Patient Consult Note    PRIMARY CARE PHYSICIAN:  Verdene Lennert, MD  REFERRING PHYSICIAN:  Verdene Lennert, MD    HPI:   [name] is a 69 y.o. year old female who is referred for evaluation of allergies.  Past medical history as noted below.    [name] reports a long standing history of allergies and previously underwent allergy testing and immunotherapy while living in [place].  She continued IT for about a year and felt that it was of benefit.  She moved to [place] for a time and 4 years ago returned to [place] to be near family.  While living in [place] she did fairly well.  In [place], she has noted symptoms of itchy eyes and ears, rhinorrhea, nasal congestion, sneezing, and frequent throat clearing with raspy, hoarse voice and cough that have gradually worsened.  She describes the feeling that something is in her throat that doesn't come up.  Her symptoms are present year round and worse with exposure to animals and dust and in the cold.  [name] has a history of asthma with symptoms of cough, wheezing, chest tightness and shortness of breath.  Asthma symptoms are worse when around animals and dust and in the cold.  [name] is on Symbicort, ipratropium nebs as needed and albuterol as needed.  She is also on montelukast.    [name] also complains of an intermittent itchy, red rash behind her ears and on the back of her neck.  This occurs year round and she has not been able to identify a trigger or pinpoint exposure to anything that may be the cause.    [name] lives in an in-law suite [place].  Pets: Son has dogs and a bird.  Flooring is a mix of carpet (bedroom) and hardwood.  Basement not damp; no mold issues.  She denies food sensitivities or allergies.  Bee, wasp and/or hornet stings have resulted in a normal local reaction.    PAST MEDICAL HISTORY:  Past Medical History   Diagnosis Date    Arthritis     Asthma     CAD  (coronary artery disease)     Diabetes mellitus      diet controlled    DVT (deep venous thrombosis)      post-op after CABG, on anticoagulation for ~2 years    PE (pulmonary embolism)      post-op after CABG, on anticoagulation for ~2 years     Past Surgical History   Procedure Laterality Date    Coronary artery bypass graft  2013     triple     Appendectomy      Tonsillectomy and adenoidectomy      Cholecystectomy      Cyst removal       both thumbs     CURRENT MEDICATIONS:   Current Outpatient Prescriptions   Medication    budesonide-formoterol (SYMBICORT) 160-4.5 MCG/ACT inhaler    Cholecalciferol (VITAMIN D3) 5000 UNITS TABS    ipratropium-albuterol (DUONEB) 0.5-2.5 (3) MG/3ML nebulizer solution    aspirin 81 MG tablet    pantoprazole (PROTONIX) 40 MG EC tablet    loratadine (KLS ALLERCLEAR) 10 MG tablet    Albuterol (VENTOLIN IN)    montelukast (SINGULAIR) 10 MG tablet    atorvastatin (LIPITOR) 10 MG tablet    docusate sodium (COLACE) 100 MG capsule    clopidogrel (PLAVIX) 75 MG tablet    metoprolol (LOPRESSOR) 25 MG tablet  azelastine (ASTELIN) 0.1 % nasal spray    Sodium Chloride-Sodium Bicarb (AYR SALINE NASAL NETI RINSE) 1.57 G PACK    fluticasone (FLONASE) 50 MCG/ACT nasal spray     No current facility-administered medications for this visit.       ALLERGIES: Morphine and Penicillins  FAMILY HISTORY:   Family History   Problem Relation Age of Onset    Other Mother      "rare lung disease"    Aneurysm Mother      abdominal aortic aneurysm    Diabetes Type 2 Mother     Breast cancer Mother     Stroke Father     Stroke Brother      x2     SOCIAL HISTORY:  Social History     Social History    Marital status: Married     Spouse name: N/A    Number of children: N/A    Years of education: N/A     Occupational History    Not on file.     Social History Main Topics    Smoking status: Never Smoker    Smokeless tobacco: Never Used    Alcohol use No    Drug use: No    Sexual  activity: Not on file     Other Topics Concern    Not on file     Social History Narrative    May 2016        Lives with her husband in an in law suite at her son's house        Three children, all live locally     Five grandchildren     Retired from school food service     ROS :    Musculoskeletal: Normal   Skin: Normal   Eyes: Normal   Ears/Nose/Mouth/Throat: Normal   Respiratory: Normal   Cardiac: Normal   Gastrointestinal: Normal   Endocrine: Normal   Allergy/Immunologic: Normal   Neurologic: Normal   Psychiatric: Normal    PHYSICAL EXAMINATION:  Visit Vitals    BP 164/72    Pulse 89    Temp 36.1 C (97 F) (Temporal)    Ht 1.702 m (5\' 7" )    Wt (!) 141.5 kg (312 lb)    SpO2 95%    BMI 48.87 kg/m2     Body mass index is 48.87 kg/(m^2).    Pain    11/18/14 1311   PainSc:   0 - No pain       General: Well-appearing, alert, and in no acute distress    Psych: Normal affect   Skin: no rashes or skin lesions on the face, scalp, neck, trunk, or extremities   Eye: no conjunctival erythema, no eyelid or periorbital edema, pupils equal and round   ENT: moist mucous membranes, no oral lesions, oropharynx clear, ears normal in size, shape, and position, normal dentition   Neck: supple, trachea midline, no thyromegaly    Lymph:  no cervical adenopathy, no supraclavicular adenopathy    Lungs:  breathing comfortably on room air, lungs clear without wheezes, rhonchi, or rales    CV:  Regular rate and rhythm. No murmurs, rubs, or gallops. Normal S1 and S2.    Neurologic: Alert and oriented, mental status normal     IMPRESSION:    Allergic rhinoconjunctivitis, history of an abbreviated course of IT years ago that was of benefit.  Now with worsening symptoms that may be contributing to sinusitis and asthma exacerbations.  AEROALLERGEN SKIN TESTING: Percutaneous and intradermal skin testing was performed and an appropriate response to histamine was observed. Panel of allergens including cat, dog,  mouse, house dust mites, tree, grass, and weed pollens and molds were done.  Allergy Testing 11/18/2014 11/18/2014   Type of testing preformed: Percutaneous Intradermal   (+)   Histamine     10mg /ml     HS 5/6 13/16   (-)   0.9% NaCl/   0.03%Alb/   0.4% Phenol 0/0 6/nf   Cat      10,000BAU     HS 0/0 6/nf   Dog     1:20     HS 0/0 6/nf   Parakeet    1:40    G 0/0 -   Mouse    1:20    G 0/0 -   D.f. Mite     10,000AU     G 0/0 6/6   D.p. Mite     10,000AU     G 0/0 6/7   Roach     1:20     G 0/0 -   Pine, white/Eastern     1:20     G 0/0 -   Elm, Amer.     1:20     HS 0/0 -   Poplar, cottonwood     1:20     G 0/0 -   Sugar Maple     1:20     G 0/0 -   Oak, white     1:20     G 2/2 -   Ash, white     1:20     G 0/0 -   Cedar, red     1:20     G 0/0 -   Willow, black     1:20     G 0/0 -   Birch, red/river     1:20     G 7/12 -   Hickory, shagbark     1:20     G 0/0 -   Black Walnut     1:20     G 0/0 -   Sycamore     1:20     G 0/0 -   Grass Mix # 5     100,000BAU     G 6/7 -   Johnson Grass     1:20     G 0/0 -   Sheep Sorrel     1:20     G 0/0 -   Plantain     1:20     G 0/0 -   Smooth Brome     1:20     G 7/7 -   Yellow Dock     1:20     G 0/0 -   Amaranths     1:20     G 0/0 -   Artemesia     1:20     G 0/0 -   Chenopodium     1:20     G 0/0 -   Cocklebur     1:20     G 0/0. -   Kochia     1:20     G 0/0 -   Ragweed     1:20     G 0/0 6/nf   Alternaria Alternata   1:10    HS 7/8 -   Bipoarus Sorokiniana   1:10    HS 6/6 -   Hormodendrum Cladospor.   1:10  HS 0/0 -   Aspergillus Mix   1:10    HS 1/2 -   Penicillium Mix   1:10    HS 1/2 -   Large skin prick positives were seen to oak, birch, grass mix, smooth brome, alternaria, bipolarus.  Intradermal testing was negative.  Negatives included animals, mites, weeds..    There is likely a component of nonallergic rhinoconjunctivitis as well as her tree, grass and mold allergies.    There is no single unifying theory of pathogenesis for chronic nonallergic rhinitis  (NAR), and it may represent a group of incompletely defined disorders. However, NAR can be broadly divided into noninflammatory and inflammatory forms  with the inflammatory form being further divided into cases that are either eosinophilic or noneosinophilic processes. With all forms of chronic NAR, there is also a variable component of autonomic dysregulation. In the noninflammatory form, these neurologic abnormalities are the only demonstrable findings.    Patients with chronic NAR have negative skin and in vitro tests for allergen-specific immunoglobulin E (IgE), by definition, and thus lack evidence of increased systemic production of allergen-specific IgE. However, IgE production may occur locally in the nasal tissue in patients with negative skin tests as well as in patients with typical allergic rhinitis.  Nonallergic rhinitis (NAR) presents later in life than allergic rhinitis, with 27 percent of patients presenting after 68 years of age. In comparison, the onset of allergic rhinitis usually occurs before age 7 (and often in childhood).     Asthma, fairly well controlled on Symbicort.  Uses albuterol and ipratropium rarely.    RECOMMENDATIONS:     1.  I suggested that we try medical management for now and advised Azyiah to increase her azelastine nasal spray twice per day.  This is more effective in nonallergic rhinitis.  2.  Continue Flonase nasal spray.  3.  Start nasal saline rinses once or twice per day.  4.  Increase loratadine to 2 tablets daily and when those are gone switch to cetirizine 20 mg daily.  5.  I'd like to see Hyun back in about 3 months to see how she's doing.  She has access to MyChart and I encouraged her to contact me with questions.    Thank you for allowing me to share in the care of this pleasant patient.  Feel free to contact my office with questions.    Mauri Reading Candie Chroman, MS, RN, ANP-BC  [facility]  [facility]  [address],  [address]  [place], [place]  [address]  P:  [phone]  F:  [phone]

## 2014-11-23 ENCOUNTER — Other Ambulatory Visit: Payer: Self-pay

## 2014-11-23 ENCOUNTER — Ambulatory Visit: Payer: Self-pay | Admitting: Pulmonology

## 2014-11-23 ENCOUNTER — Encounter: Payer: Self-pay | Admitting: Pulmonology

## 2014-11-23 ENCOUNTER — Ambulatory Visit: Payer: Self-pay

## 2014-11-23 VITALS — BP 163/84 | HR 99 | Temp 96.1°F | Resp 18 | Ht 67.0 in | Wt 307.0 lb

## 2014-11-23 DIAGNOSIS — J45909 Unspecified asthma, uncomplicated: Secondary | ICD-10-CM

## 2014-11-23 DIAGNOSIS — J449 Chronic obstructive pulmonary disease, unspecified: Secondary | ICD-10-CM

## 2014-11-23 MED ORDER — TIOTROPIUM BROMIDE MONOHYDRATE 18 MCG IN CAPS *I*
18.0000 ug | ORAL_CAPSULE | Freq: Every day | RESPIRATORY_TRACT | 6 refills | Status: DC
Start: 2014-11-23 — End: 2015-08-22

## 2014-11-23 NOTE — Patient Instructions (Addendum)
I will add Spiriva.  Use it once a day.  Continue Symbicort.  Continue CPAP  Diet and exercise  Follow up with allergist about the cough  See you in 6 months with a breathing test  Flu shot in the fall

## 2014-11-23 NOTE — Progress Notes (Signed)
Dear Doctor Verdene Lennert, MD:      [name] was seen in Pulmonary Clinic on 11/23/2014 in follow up for shortness of breath, morbid obesity, asthma and OSA.   As you know, [name] is a 69 y.o. female patient who was last seen in January 2016.      Since our last correspondence, [name] reports she has had a cough for about 2 months.  She has a "tickle" in her throat.  She saw an allergist last week and was started on an additional nasal spray and her OTC allergy medication was increased.  So far, [name] has not noticed any change in the cough.  Her nasal congestion is better.      In May she had a course of prednisone for an asthma exacerbation.  She does not have chest pain but she has occasional chest pressure.  She has reflux disease and she takes Protonix daily.  She does not have the head of her bed elevated and does not follow diet restrictions.  She does not drink coffee or other caffeine.  She has reduced her soda pop intake to 1-2 a week.  She does not use alcohol.  She has occasional spicy foods and tomato sauce.      [name] is using her CPAP nightly and feels more rested during the day.  She is says she is sleeping well with the CPAP.    She has gained 10 pounds since her last visit with me in January.  [facility] husband had [facility].  She says she can walk about half a block.  She is short of breath with stairs.  She is using Symbicort and Ventolin.  She is using the Ventolin 3-4 times a day. She takes Singulair at night. She has used Spiriva in the past but she doesn't know if it helped.     Medications were reconciled.  Pulmonary medications as indicated above.      On my exam,Blood pressure 163/84, pulse 99, temperature 35.6 C (96.1 F), temperature source Tympanic, resp. rate 18, height 1.702 m (5\' 7" ), weight (!) 139.3 kg (307 lb), SpO2 96 %.  No acute distress. HEENT exam notable for anicteric sclera.   Oropharynx clear with no evidence of thrush. There was no palpable cervical or supraclavicular adenopathy. Cardiac tones were normal with no murmur, gallop or rub.  Lung exam revealed good air entry bilaterally with no rales, rhonchi or wheezing.  Abdomen was soft and nontender with no palpable masses.  There was trace bilateral lower extremity edema. She was alert and oriented and had a grossly nonfocal neurological exam.    Spirometry was performed at this visit and revealed a reduced FEV1/FVC ratio of 69, with a moderately reduced FEV1 of 1.57 liters (58% of predicted) and a mildly reduced FVC of 2.28 liters (64% of predicted).  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     Overnight oximetry was done on CPAP and room air in January 2016 and there was no need for supplemental oxygen.    Impression and Recommendations: [name] is a 69 y.o. female patient with dyspnea/asthma, morbid obesity and OSA.   Her spirometry is stable and she is using her CPAP.      At this time my recommendations include:    1.  Continue Symbicort and albuterol and CPAP.   I added Spiriva today.  2.  Exercise and weight loss.   3.  Follow up chest CT in October (ordered).  4.  Follow up with me in 6 months with spirometry.  5.  Influenza vaccine in the fall when available.     Thank you for allowing me to continue in the care of [name].  Please do not hesitate to contact me if you have any questions or comments.       Sincerely,    Maurine Simmering, MD  Pulmonary and Critical Care Medicine

## 2014-12-07 ENCOUNTER — Ambulatory Visit: Payer: Self-pay | Admitting: Pediatrics

## 2015-01-03 ENCOUNTER — Encounter: Payer: Self-pay | Admitting: Primary Care

## 2015-01-03 ENCOUNTER — Ambulatory Visit: Payer: Self-pay | Admitting: Primary Care

## 2015-01-03 VITALS — BP 138/80 | HR 88 | Resp 24 | Ht 67.0 in | Wt 309.0 lb

## 2015-01-03 DIAGNOSIS — R61 Generalized hyperhidrosis: Secondary | ICD-10-CM

## 2015-01-03 DIAGNOSIS — L608 Other nail disorders: Secondary | ICD-10-CM

## 2015-01-03 DIAGNOSIS — J3089 Other allergic rhinitis: Secondary | ICD-10-CM

## 2015-01-03 DIAGNOSIS — R911 Solitary pulmonary nodule: Secondary | ICD-10-CM

## 2015-01-03 DIAGNOSIS — J4540 Moderate persistent asthma, uncomplicated: Secondary | ICD-10-CM

## 2015-01-03 DIAGNOSIS — Z1211 Encounter for screening for malignant neoplasm of colon: Secondary | ICD-10-CM

## 2015-01-03 DIAGNOSIS — E669 Obesity, unspecified: Secondary | ICD-10-CM

## 2015-01-03 LAB — CBC AND DIFFERENTIAL
Baso # K/uL: 0.1 10*3/uL (ref 0.0–0.1)
Basophil %: 1.1 %
Eos # K/uL: 0.2 10*3/uL (ref 0.0–0.4)
Eosinophil %: 2 %
Hematocrit: 41 % (ref 34–45)
Hemoglobin: 12.8 g/dL (ref 11.2–15.7)
IMM Granulocytes #: 0 10*3/uL (ref 0.0–0.1)
IMM Granulocytes: 0.4 %
Lymph # K/uL: 2.2 10*3/uL (ref 1.2–3.7)
Lymphocyte %: 24.1 %
MCH: 28 pg/cell (ref 26–32)
MCHC: 31 g/dL — ABNORMAL LOW (ref 32–36)
MCV: 91 fL (ref 79–95)
Mono # K/uL: 0.7 10*3/uL (ref 0.2–0.9)
Monocyte %: 8.1 %
Neut # K/uL: 5.8 10*3/uL (ref 1.6–6.1)
Nucl RBC # K/uL: 0 10*3/uL (ref 0.0–0.0)
Nucl RBC %: 0 /100 WBC (ref 0.0–0.2)
Platelets: 280 10*3/uL (ref 160–370)
RBC: 4.5 MIL/uL (ref 3.9–5.2)
RDW: 14 % (ref 11.7–14.4)
Seg Neut %: 64.3 %
WBC: 9 10*3/uL (ref 4.0–10.0)

## 2015-01-03 LAB — COMPREHENSIVE METABOLIC PANEL
ALT: 24 U/L (ref 0–35)
AST: 29 U/L (ref 0–35)
Albumin: 4 g/dL (ref 3.5–5.2)
Alk Phos: 115 U/L — ABNORMAL HIGH (ref 35–105)
Anion Gap: 15 (ref 7–16)
Bilirubin,Total: 0.4 mg/dL (ref 0.0–1.2)
CO2: 28 mmol/L (ref 20–28)
Calcium: 9.4 mg/dL (ref 8.6–10.2)
Chloride: 102 mmol/L (ref 96–108)
Creatinine: 0.93 mg/dL (ref 0.51–0.95)
GFR,Black: 73 *
GFR,Caucasian: 63 *
Glucose: 124 mg/dL — ABNORMAL HIGH (ref 60–99)
Lab: 15 mg/dL (ref 6–20)
Potassium: 4.8 mmol/L (ref 3.3–5.1)
Sodium: 145 mmol/L (ref 133–145)
Total Protein: 6.8 g/dL (ref 6.3–7.7)

## 2015-01-03 LAB — TSH: TSH: 2.49 u[IU]/mL (ref 0.27–4.20)

## 2015-01-03 NOTE — Progress Notes (Signed)
Outpatient Progress Note    SUBJECTIVE    [name]  is a 69 y.o. female  here for follow up visit.  Today we discussed:    F/u asthma, allergic rhinitis  Saw her Allergist and Pulmonologist  Having episodes of coughing that seem to come and go for the past few weeks  Feels like she has phlegm that she is unable to cough up  No wheezing, doesn't feel too congested, no fevers  Her current regimen includes spiriva daily, symbicort BID, claritin 20mg  daily, astelin nasal spray twice daily, and flonase     Has back pain with walking, which causes excessive sweating (not hot flashes) with acitvity. The sweating seems to have gotten worse in the past few months.    Feels fine, no dyspnea or CP at those times, just the sweating. Never smoker    Notes urinary frequency     Obesity - joined [employer], planning to work out three times a week. Feels like she has lost 6 pounds recently (stable on our scale)    The patient's past medical history, problem list, medications and allergies reviewed in electronic chart today.      OBJECTIVE    Blood pressure 138/80, pulse 88, resp. rate 24, height 1.702 m (5\' 7" ), weight (!) 140.2 kg (309 lb), SpO2 95 %.   Body mass index is 48.4 kg/(m^2).   Gen: NAD, appears well, pleasant  Lungs: clear to auscultation bilaterally with no wheezes, rales, or rhonchi  CV: RRR, normal S1,S2, no M/G/R  Extremities: warm and well perfused, no edema, blackened area at the base of her left third toenail with underlying nail thickening  Psych: normal affect    ASSESSMENT & PLAN  1. Moderate persistent asthma without complication  She will continue with Symbicort, Spiriva, and as needed albuterol.  I asked that she continue to monitor her symptoms, as it sounds like she may have had a respiratory illness that is now resolving, but with ongoing cough, versus potentially an asthma exacerbation.  She will continue to follow-up with Dr.Petronaci in January as planned. Will plan to repeat CXR if her  cough continues     2. Other allergic rhinitis  She will continue with Claritin and Astelin nasal spray along with Flonase.  She will follow-up in allergy clinic as planned in October.    3. Obesity, unspecified obesity severity, unspecified obesity type  Strongly encouraged her efforts at weight loss and exercise, which are starting to pay off     4. Excessive sweating  Her symptoms sound mild to me and I am not too concerned, but we will update her blood work.  She will let me know if this continues  - Comprehensive metabolic panel; Future  - CBC and differential; Future  - TSH; Future  - Hemoglobin A1c; Future    5. Pulmonary nodule  Follow-up CT scan planned for October.      6. Colon cancer screening - AMB REFERRAL TO GASTROENTEROLOGY. Did note polyps on a prior colonoscopy -- told she needed these q5 years    7. Toenail deformity - AMB REFERRAL TO PODIATRY    Review PPI wean next visit    Pt agrees with and understands plan.    Return in about 3 months (around 04/05/2015) for f/u sweating, back pain, cough. Return to office sooner prn.     Verdene Lennert, MD

## 2015-01-04 ENCOUNTER — Encounter: Payer: Self-pay | Admitting: Primary Care

## 2015-01-04 LAB — HEMOGLOBIN A1C: Hemoglobin A1C: 6.4 % — ABNORMAL HIGH (ref 4.0–6.0)

## 2015-01-08 ENCOUNTER — Other Ambulatory Visit: Payer: Self-pay | Admitting: Pulmonology

## 2015-01-08 DIAGNOSIS — J302 Other seasonal allergic rhinitis: Secondary | ICD-10-CM

## 2015-01-26 NOTE — Telephone Encounter (Signed)
Chest Ct authorization no. [id number], exp. 03/11/15.  Appt scheduled at [facility] on 10/10 at 12 pm.  Mychart message sent to the patient.

## 2015-02-10 ENCOUNTER — Encounter: Payer: Self-pay | Admitting: Gastroenterology

## 2015-02-10 LAB — HM DIABETES EYE EXAM

## 2015-02-13 ENCOUNTER — Encounter: Payer: Self-pay | Admitting: Primary Care

## 2015-02-16 ENCOUNTER — Other Ambulatory Visit: Payer: Self-pay | Admitting: Pulmonology

## 2015-02-16 MED ORDER — MONTELUKAST SODIUM 10 MG PO TABS *I*
10.0000 mg | ORAL_TABLET | Freq: Every evening | ORAL | 6 refills | Status: DC
Start: 2015-02-16 — End: 2015-09-25

## 2015-02-20 ENCOUNTER — Ambulatory Visit: Payer: Self-pay | Admitting: Primary Care

## 2015-02-20 ENCOUNTER — Ambulatory Visit
Admission: RE | Admit: 2015-02-20 | Discharge: 2015-02-20 | Disposition: A | Discharge status: Home / Self Care | Payer: Self-pay | Source: Ambulatory Visit | Attending: Pulmonology | Admitting: Pulmonology

## 2015-02-20 ENCOUNTER — Encounter: Payer: Self-pay | Admitting: Primary Care

## 2015-02-20 DIAGNOSIS — R7303 Prediabetes: Secondary | ICD-10-CM

## 2015-02-20 DIAGNOSIS — I2510 Atherosclerotic heart disease of native coronary artery without angina pectoris: Secondary | ICD-10-CM

## 2015-02-20 DIAGNOSIS — E669 Obesity, unspecified: Secondary | ICD-10-CM

## 2015-02-20 DIAGNOSIS — H6690 Otitis media, unspecified, unspecified ear: Secondary | ICD-10-CM

## 2015-02-20 DIAGNOSIS — H7290 Unspecified perforation of tympanic membrane, unspecified ear: Secondary | ICD-10-CM | POA: Insufficient documentation

## 2015-02-20 MED ORDER — OFLOXACIN 0.3 % OT SOLN *I*
5.0000 [drp] | Freq: Two times a day (BID) | OTIC | 1 refills | Status: DC
Start: 2015-02-20 — End: 2015-05-16

## 2015-02-20 MED ORDER — CEFUROXIME AXETIL 500 MG PO TABS *I*
500.0000 mg | ORAL_TABLET | Freq: Two times a day (BID) | ORAL | 0 refills | Status: DC
Start: 2015-02-20 — End: 2015-03-08

## 2015-02-20 NOTE — Assessment & Plan Note (Signed)
Patient endorses good med adherence. No CP, SOB, edema, PND, orthopnea, or visual changes, no polyuria, polydypsia.

## 2015-02-20 NOTE — Progress Notes (Signed)
[facility] - Outpatient Progress Note    SUBJECTIVE    [name] is a 69 y.o. female who presents for Ear Fullness (right ear)  R ear fullness for 2 days, had fevers and congestion mostly resolving now, dec hearing for 2 days, noted no drainage/otorrhea per patient.  Now still with dec hearing, some mild-moderate ear pain only R side, intermittent nasal congestion.    CAD (coronary artery disease)  No recent CP, SOB, edema or visual changes. Endorses good med adherence.     Prediabetes  Patient endorses good med adherence. No CP, SOB, edema, PND, orthopnea, or visual changes, no polyuria, polydypsia.     Obesity  Has been working with PCP on diet & weight loss, praised patient for her efforts!!    Patient's medications, allergies, past medical, surgical, social and family histories were reviewed and updated as appropriate.    Review of Systems   Constitutional: Negative for fever and chills.   Respiratory: Negative for shortness of breath.    Cardiovascular: Negative for chest pain.   Gastrointestinal: Negative for abdominal pain.     Current Outpatient Prescriptions   Medication Sig    montelukast (SINGULAIR) 10 MG tablet Take 1 tablet (10 mg total) by mouth nightly    fluticasone (FLONASE) 50 MCG/ACT nasal spray INHALE 2 SPRAYS INTO EACH NOSTRIL EVERY DAY    atorvastatin (LIPITOR) 40 MG tablet Take 40 mg by mouth daily (with dinner)    tiotropium (SPIRIVA) 18 MCG inhalation capsule Inhale 1 capsule (18 mcg total) into the lungs daily    azelastine (ASTELIN) 0.1 % nasal spray 1 spray by Nasal route 2 times daily    Sodium Chloride-Sodium Bicarb (AYR SALINE NASAL NETI RINSE) 1.57 G PACK 1 packet by Nasal route daily    budesonide-formoterol (SYMBICORT) 160-4.5 MCG/ACT inhaler Inhale 2 puffs into the lungs 2 times daily   Shake well before each use.    Cholecalciferol (VITAMIN D3) 5000 UNITS TABS Take by mouth daily    ipratropium-albuterol (DUONEB) 0.5-2.5 (3) MG/3ML nebulizer  solution Take 3 mLs by nebulization every 6 hours    aspirin 81 MG tablet Take 81 mg by mouth daily    pantoprazole (PROTONIX) 40 MG EC tablet Take 40 mg by mouth daily   Swallow whole. Do not crush, break, or chew.    loratadine (KLS ALLERCLEAR) 10 MG tablet Take 20 mg by mouth daily       Albuterol (VENTOLIN IN) Inhale into the lungs    docusate sodium (COLACE) 100 MG capsule Take 200 mg by mouth daily    clopidogrel (PLAVIX) 75 MG tablet Take 75 mg by mouth daily    metoprolol (LOPRESSOR) 25 MG tablet Take 12.5 mg by mouth 2 times daily       cefuroxime (CEFTIN) 500 MG tablet Take 1 tablet (500 mg total) by mouth 2 times daily    ofloxacin (FLOXIN) 0.3 % otic solution Place 5 drops into the right ear 2 times daily         OBJECTIVE    Visit Vitals    BP 132/82    Pulse 78    Temp 36.6 C (97.9 F) (Temporal)    Ht 1.702 m (5' 7.01")    Wt 134.7 kg (296 lb 14.4 oz)    SpO2 97%    BMI 46.49 kg/m2     GEN: Pleasant and cooperative in no apparent distress.  R ear + small TM perf, external irritation, small effusion, no  otorrhea.  L TM clear with good light reflex.  Neck: supple, no thyromegaly, no LAD.  Cardiovascular: Normal rate and regular rhythm, S1S2, no murmurs, rubs, or gallops. Pulses are palpable.  Pulmonary/Chest: Effort normal and breath sounds normal, no wheezes, rales, or rhonchi.   Abd: soft, NT/ND, +BSx4, no masses, no tenderness, no hepatosplenomegaly.   Ext: no clubbing, edema, or cyanosis.      ASSESSMENT & PLAN    1. Coronary artery disease:  Chronic stable on current Rx.     2. Prediabetes - chronic stable, Advised a low sodium Mediterranean diet with modest protein (mostly from vegan or fish sources), only low glycemic carbohydrates, and high in fruits and vegetables. Advised exercise at least 30-60 minutes 5 days weekly with a combination of weight bearing and aerobic workouts.       3. Obesity, unspecified obesity severity, unspecified obesity type     4. Acute otitis media with  perforated tympanic membrane, unspecified laterality - Rx po and topical Rx x 10 days.   cefuroxime (CEFTIN) 500 MG tablet    ofloxacin (FLOXIN) 0.3 % otic solution   advised patient to call if hearing & discomfort not resolving in 1 week, otherwise can keep her November appt with PCP    Follow up in 2 - 3 wks as scheduled with PCP.  Precautions reviewed.    Alda Lea, MD

## 2015-02-20 NOTE — Assessment & Plan Note (Signed)
No recent CP, SOB, edema or visual changes. Endorses good med adherence.

## 2015-02-20 NOTE — Assessment & Plan Note (Signed)
Not really following any diet or exercise plan, voiced understanding of risks.

## 2015-02-23 ENCOUNTER — Encounter: Payer: Self-pay | Admitting: Pulmonology

## 2015-02-28 ENCOUNTER — Encounter: Payer: Self-pay | Admitting: Primary Care

## 2015-02-28 ENCOUNTER — Ambulatory Visit: Payer: Self-pay | Admitting: Primary Care

## 2015-02-28 VITALS — BP 124/74 | HR 84 | Temp 97.8°F | Ht 67.01 in | Wt 296.0 lb

## 2015-02-28 DIAGNOSIS — H6591 Unspecified nonsuppurative otitis media, right ear: Secondary | ICD-10-CM

## 2015-02-28 DIAGNOSIS — J4540 Moderate persistent asthma, uncomplicated: Secondary | ICD-10-CM

## 2015-02-28 MED ORDER — BUDESONIDE-FORMOTEROL FUMARATE 160-4.5 MCG/ACT IN AERO *I*
2.0000 | INHALATION_SPRAY | Freq: Two times a day (BID) | RESPIRATORY_TRACT | 5 refills | Status: DC
Start: 2015-02-28 — End: 2015-12-11

## 2015-02-28 NOTE — Progress Notes (Signed)
Outpatient Progress Note    SUBJECTIVE    [name]  is a 69 y.o. female  here for follow up visit.  Today we discussed:    R ear ringing  Started about 1.5 weeks ago, felt like it was plugged  The noise is constant since it started, frustrating, and interferes with her hearing, which seems decreased to her   It's a buzzing, high pitched  Seen 10/10 and she was started on abx, both topical and oral for a perforated TM. This did not seem to help at all  She was congested related to allergies but no acute illness  No HA, no dizziness/vertigo, no otorrhea     The patient's past medical history, problem list, medications and allergies reviewed in electronic chart today.      OBJECTIVE    Blood pressure 124/74, pulse 84, temperature 36.6 C (97.8 F), temperature source Temporal, height 1.702 m (5' 7.01"), weight 134.3 kg (296 lb).   Body mass index is 46.35 kg/(m^2).   Gen: NAD, appears well, pleasant  HEENT: PERRL, conjunctiva without injection, sclerae anicteric, oropharynx clear. Ear canals clear. L TMs grey with +LR without erythema or retraction. R TM with a yellow colored effusion throughout the entirely of the TM. No perforation visualized  Neck: no thyromegaly  Lymph: No cervical lymphadenopathy  Lungs: clear to auscultation bilaterally with no wheezes, rales, or rhonchi  CV: RRR, normal S1,S2, no M/G/R  Psych: normal affect    ASSESSMENT & PLAN  1. Middle ear effusion, right  Significant yellow colored effusion accompanied by 1.5 weeks of tinnitus in the setting of recent treatment for AOM (ceftin po + ofloxacin topical) and a small TM perforation 10/10.   - AMB REFERRAL TO ENT as her TM exam is unusual and I would like another opinion    2. Moderate persistent asthma without complication  Refill requested  - budesonide-formoterol (SYMBICORT) 160-4.5 MCG/ACT inhaler; Inhale 2 puffs into the lungs 2 times daily   Shake well before each use.  Dispense: 1 Inhaler; Refill: 5    Pt agrees with and  understands plan.    F/u as scheduled with me for routine care     Verdene Lennert, MD

## 2015-03-07 ENCOUNTER — Encounter: Payer: Self-pay | Admitting: Gastroenterology

## 2015-03-08 ENCOUNTER — Ambulatory Visit: Payer: Self-pay | Admitting: Allergy and Immunology

## 2015-03-08 ENCOUNTER — Encounter: Payer: Self-pay | Admitting: Allergy and Immunology

## 2015-03-08 VITALS — BP 144/66 | HR 60 | Temp 97.0°F | Ht 67.0 in | Wt 297.0 lb

## 2015-03-08 DIAGNOSIS — J301 Allergic rhinitis due to pollen: Secondary | ICD-10-CM

## 2015-03-08 NOTE — Patient Instructions (Signed)
No change in medications

## 2015-03-08 NOTE — Progress Notes (Signed)
Allergy Follow-Up     PRIMARY CARE PHYSICIAN: Verdene Lennert, MD    HPI:   Kenzley Ke is a 69 y.o. year old female who is here for follow-up of   1. Non-seasonal allergic rhinitis due to pollen    She has done well from an allergy standpoint and feels that her symptoms have improved since her last visit.  Kanija has been treated for otitis media recently and has decreased hearing and tinnitus in her right ear.  She is scheduled with ENT tomorrow for these issues.    PAST MEDICAL HISTORY: Reviewed and updated as needed.   ALLERGIES: Morphine and Penicillins    CURRENT MEDICATIONS:   Current Outpatient Prescriptions   Medication    budesonide-formoterol (SYMBICORT) 160-4.5 MCG/ACT inhaler    ofloxacin (FLOXIN) 0.3 % otic solution    montelukast (SINGULAIR) 10 MG tablet    fluticasone (FLONASE) 50 MCG/ACT nasal spray    atorvastatin (LIPITOR) 40 MG tablet    tiotropium (SPIRIVA) 18 MCG inhalation capsule    azelastine (ASTELIN) 0.1 % nasal spray    Sodium Chloride-Sodium Bicarb (AYR SALINE NASAL NETI RINSE) 1.57 G PACK    Cholecalciferol (VITAMIN D3) 5000 UNITS TABS    ipratropium-albuterol (DUONEB) 0.5-2.5 (3) MG/3ML nebulizer solution    aspirin 81 MG tablet    pantoprazole (PROTONIX) 40 MG EC tablet    loratadine (KLS ALLERCLEAR) 10 MG tablet    Albuterol (VENTOLIN IN)    docusate sodium (COLACE) 100 MG capsule    clopidogrel (PLAVIX) 75 MG tablet    metoprolol (LOPRESSOR) 25 MG tablet     No current facility-administered medications for this visit.      FAMILY HISTORY: Reviewed and updated as needed.   SOCIAL HISTORY: Reviewed and updated as needed.     ROS :    Constitutional: Normal   Skin: Normal   Eyes: Normal   Ears/Nose/Mouth/Throat: Normal   Respiratory: Normal   Cardiac: Normal   Gastrointestinal: Normal   Endocrine: Normal   Allergy/Immunologic: Normal   Neurologic: Normal   Psychiatric: Normal    PHYSICAL EXAMINATION:  Visit Vitals    BP 144/66    Pulse 60     Temp 36.1 C (97 F) (Temporal)    Ht 1.702 m (5\' 7" )    Wt 134.7 kg (297 lb)    SpO2 96%    BMI 46.52 kg/m2      General:  Well-appearing, alert, and in no acute distress    Psych: Normal affect   Skin: no rashes or skin lesions on the face   Eye: no conjunctival erythema, no eyelid or periorbital edema   Lungs:  breathing comfortably on room air    Neurologic: Alert and oriented, mental status normal     IMPRESSION:  Allergic and nonallergic rhinitis, symptomatically improved since her last visit with the increase in her azelastine nasal spray and nasal rinses.    RECOMMENDATIONS:  1.  No changes were made today.  I have left Jewels's follow up open-ended but would be glad to see her back at any time if needed.  She has access to MyChart and I encouraged her to contact me with questions.    Thank you for allowing me to share in the care of this pleasant patient.  Feel free to contact my office with questions.    Mauri Reading Candie Chroman, MS, RN, ANP-BC  [facility]  [address]  [place], [place]  [address]  P:  [phone]  F:  [phone]

## 2015-03-10 ENCOUNTER — Telehealth: Payer: Self-pay | Admitting: Primary Care

## 2015-03-10 NOTE — Telephone Encounter (Signed)
Spoke with pt, states her podiatrist is going to be starting her on an anti fungal medication (unsure of name).  Wanted to know her liver counts.  Reviewed with pt results from August.  Let pt know that we will call her back if Dr. Adella Nissen had any concerns with her starting this kind of medication.

## 2015-03-10 NOTE — Telephone Encounter (Signed)
Patient would like a nurse to call her and go over her lab results that she got on MyChart. Patient states the her "foot doctor" wants to prescribe a new medication, but needs to make sure that the patient's liver/kidney labs are normal. Please advise. [phone]

## 2015-03-10 NOTE — Telephone Encounter (Signed)
No concerns from me about starting an antifungal medication based on her most recent lfts.

## 2015-03-14 DIAGNOSIS — D369 Benign neoplasm, unspecified site: Secondary | ICD-10-CM

## 2015-03-14 HISTORY — DX: Benign neoplasm, unspecified site: D36.9

## 2015-03-20 ENCOUNTER — Telehealth: Payer: Self-pay | Admitting: Gastroenterology

## 2015-03-20 NOTE — Telephone Encounter (Signed)
·   Pre procedure call made to [facility] for colonoscopy scheduled on 03/21/2015 with Dr. Cherylynn Ridges. Informed patient to arrive at department at [facility]    A message was left confirming the following information:      1. Patient's arrival time and date   2. Your driver must stay with you and drive you home when discharged  3. Parking information/location given to patient  4. Directions to department given  5. Patient to bring the following: Insurance card, Photo ID, Pre procedure medication list, Health History form  6. If patient has a pacemaker, please bring along the pacemaker ID card  7. If patient is taking any blood thinner, blood pressure medication or insulin your doctors office should of given you instructions regarding these medications. If not you should contact the office immediately.   8. Any further questions, instructed to reach [place] at [phone], Monday thru Friday, 7:30am to 5:00pm.

## 2015-03-21 ENCOUNTER — Encounter
Admission: RE | Disposition: A | Discharge status: Home / Self Care | Payer: Self-pay | Source: Ambulatory Visit | Attending: Gastroenterology

## 2015-03-21 ENCOUNTER — Encounter: Payer: Self-pay | Admitting: Gastroenterology

## 2015-03-21 ENCOUNTER — Ambulatory Visit
Admission: RE | Admit: 2015-03-21 | Disposition: A | Discharge status: Home / Self Care | Payer: Self-pay | Source: Ambulatory Visit | Attending: Gastroenterology | Admitting: Gastroenterology

## 2015-03-21 LAB — HM COLONOSCOPY

## 2015-03-21 SURGERY — COLONOSCOPY

## 2015-03-21 MED ORDER — SODIUM CHLORIDE 0.9 % IV SOLN WRAPPED *I*
50.0000 mL/h | Status: DC
Start: 2015-03-21 — End: 2015-03-21
  Administered 2015-03-21: 50 mL/h via INTRAVENOUS

## 2015-03-21 MED ORDER — MIDAZOLAM HCL 5 MG/5ML IJ SOLN *I*
INTRAMUSCULAR | Status: AC | PRN
Start: 2015-03-21 — End: 2015-03-21
  Administered 2015-03-21: 2 mg via INTRAVENOUS
  Administered 2015-03-21: 1 mg via INTRAVENOUS
  Administered 2015-03-21: 2 mg via INTRAVENOUS

## 2015-03-21 MED ORDER — FENTANYL CITRATE 50 MCG/ML IJ SOLN *WRAPPED*
INTRAMUSCULAR | Status: AC
Start: 2015-03-21 — End: 2015-03-21
  Filled 2015-03-21: qty 2

## 2015-03-21 MED ORDER — MIDAZOLAM HCL 5 MG/5ML IJ SOLN *I*
INTRAMUSCULAR | Status: AC
Start: 2015-03-21 — End: 2015-03-21
  Filled 2015-03-21: qty 5

## 2015-03-21 MED ORDER — FENTANYL CITRATE 50 MCG/ML IJ SOLN *WRAPPED*
INTRAMUSCULAR | Status: AC | PRN
Start: 2015-03-21 — End: 2015-03-21
  Administered 2015-03-21: 09:00:00 50 ug via INTRAVENOUS

## 2015-03-21 NOTE — Procedures (Signed)
Colonoscopy Procedure Note     Date of Procedure: 03/21/2015   Primary Physician: Verdene Lennert, MD        Attending Physician: Kelly Splinter, M.D.      Indications: Surveillance for history of colonic polyps  Previous colonoscopy: Yes -- Date: 2008  Medications:Fentanyl 50 mcg IV and Midazolam 5 mg IV were administered incrementally over the course of the procedure to achieve an adequate level of conscious sedation.     Informed Consent: Prior to the procedure, the patient was explained the risk, benefits and alternatives including the risk of bleeding, infection or perforation and informed consent was obtained.     Procedure Details: The patient was placed in the left lateral decubitus position and monitored continuously with ECG tracing, pulse oximetry, blood pressure monitoring and direct observations. After anorectal examination was performed, the Olympus CF-H180was inserted into the rectum and advanced under direct vision to the cecum, which was identified by  the ileocecal valve and the appendiceal orifice. The procedure was considered not difficult..    Narrative:    During withdrawal examination, the final quality of the prep was [facility] Bowel Prep Scale   Prep:    Right Colon: Grade3- (entire mucosa of colon segment seen well, with no residual staining, small fragments of stool, or opaque liquid)    Transverse Colon: Grade 2- (minor amount of residual staining, small fragments of stool, and/or opaque liquid, but mucosa of colon segment is seen well)    Left Colon: Grade 3- (entire mucosa of colon segment seen well, with no residual staining, small fragments of stool, or opaque liquid)    A careful inspection was made as the colonoscope was withdrawn, a retroflexed view of the rectum was included; findings and interventions are described below. Appropriate photo documentation wasobtained. The patient  recovered in the [facility].    Findings:     Colon: 1. Cecal, 1 cm, sessile polyp.  Cold snare removed and sent to pathology.  2. Cecal, 5 mm, polyp. Cold snare removed and sent to pathology.  3. Rectal, 4 mm, polyp. Cold snare removed and sent to pathology.  4. Moderate left sided diverticulosis- large mouthed.  5. Medium sized internal hemorrhoids.       Complications: The patient tolerated the procedure well.    Impression: 1. Polyps  2. Diverticulosis  3. Hemorrhoids    Recommendations: 1. Await pathology  2. Repeat colonoscopy in 3 years due to presumed adenoma in the cecum.  3. Fibercon 2 tabs daily forever.   4.  Increase fiber in diet- more fruits/vegetables and salads. Avoid processed fiber products e.g. Cereals or fiber bars. Goal is 5 servings daily- Strive for Five!     Kelly Splinter, MD  Cell: [phone]  Office Phone # [phone]

## 2015-03-21 NOTE — Preop H&P (Signed)
OUTPATIENT PRE-PROCEDURE H&P    Chief Complaint / Indications for Procedure: Screening colonoscopy    Past Medical History:     Past Medical History   Diagnosis Date    Arthritis     Asthma     CAD (coronary artery disease)     DVT (deep venous thrombosis)      post-op after CABG, on anticoagulation for ~2 years    PE (pulmonary embolism)      post-op after CABG, on anticoagulation for ~2 years    Prediabetes      Past Surgical History   Procedure Laterality Date    Coronary artery bypass graft  2013     triple     Appendectomy      Tonsillectomy and adenoidectomy      Cholecystectomy      Cyst removal       both thumbs     Family History   Problem Relation Age of Onset    Other Mother      "rare lung disease"    Aneurysm Mother      abdominal aortic aneurysm    Diabetes Type 2 Mother     Breast cancer Mother     Stroke Father     Stroke Brother      x2     Social History     Social History    Marital status: Married     Spouse name: N/A    Number of children: N/A    Years of education: N/A     Social History Main Topics    Smoking status: Never Smoker    Smokeless tobacco: Never Used    Alcohol use No    Drug use: No    Sexual activity: Not Asked     Other Topics Concern    None     Social History Narrative    May 2016        Lives with her husband in an in law suite at her son's house        Three children, all live locally     Five grandchildren     Retired from school food service       Allergies:    Allergies   Allergen Reactions    Morphine Hives    Penicillins Hives       Medications:  Prescriptions Prior to Admission   Medication Sig    terbinafine (LAMISIL) 250 MG tablet Take 250 mg by mouth daily    budesonide-formoterol (SYMBICORT) 160-4.5 MCG/ACT inhaler Inhale 2 puffs into the lungs 2 times daily   Shake well before each use.    ofloxacin (FLOXIN) 0.3 % otic solution Place 5 drops into the right ear 2 times daily    montelukast (SINGULAIR) 10 MG tablet Take 1 tablet (10  mg total) by mouth nightly    fluticasone (FLONASE) 50 MCG/ACT nasal spray INHALE 2 SPRAYS INTO EACH NOSTRIL EVERY DAY    atorvastatin (LIPITOR) 40 MG tablet Take 40 mg by mouth daily (with dinner)    tiotropium (SPIRIVA) 18 MCG inhalation capsule Inhale 1 capsule (18 mcg total) into the lungs daily    azelastine (ASTELIN) 0.1 % nasal spray 1 spray by Nasal route 2 times daily    Cholecalciferol (VITAMIN D3) 5000 UNITS TABS Take by mouth daily    aspirin 81 MG tablet Take 81 mg by mouth daily    pantoprazole (PROTONIX) 40 MG EC tablet Take 40 mg by mouth daily  Swallow whole. Do not crush, break, or chew.    loratadine (KLS ALLERCLEAR) 10 MG tablet Take 20 mg by mouth daily       Albuterol (VENTOLIN IN) Inhale into the lungs    docusate sodium (COLACE) 100 MG capsule Take 200 mg by mouth daily    clopidogrel (PLAVIX) 75 MG tablet Take 75 mg by mouth daily    metoprolol (LOPRESSOR) 25 MG tablet Take 12.5 mg by mouth 2 times daily       Sodium Chloride-Sodium Bicarb (AYR SALINE NASAL NETI RINSE) 1.57 G PACK 1 packet by Nasal route daily    ipratropium-albuterol (DUONEB) 0.5-2.5 (3) MG/3ML nebulizer solution Take 3 mLs by nebulization every 6 hours     Current Facility-Administered Medications   Medication Dose Route Frequency    sodium chloride 0.9 % IV  50 mL/hr Intravenous Continuous     No current outpatient prescriptions on file.     Vitals:    03/21/15 0854   BP: 149/75   Pulse: 84   Resp: 16   Temp: 36.3 C (97.3 F)   Weight: 129.7 kg (286 lb)   Height: 170.2 cm (_0 )           Physical Examination:Oropharynx: mucosa pink and moist  Lungs: percussion normal, good diaphragmatic excursion, lungs clear to auscultation bilaterally  Heart: regular rate and rhythm, no murmurs, gallops or rubs  Abdomen: abdomen soft, non-tender, nondistended, normal active bowel sounds, no masses or organomegaly  Neuro: No focal neurology  Extremities/Musculoskeletal: No rash,cyanosis,or edema noted       Last Lab  Results:   Lab Results   Component Value Date/Time    WBC 9.0 01/03/2015 12:09 PM    HCT 41 01/03/2015 12:09 PM    PLT 280 01/03/2015 12:09 PM    INR 1.8 (H) 06/16/2013 07:52 AM    AST 29 01/03/2015 12:09 PM    ALT 24 01/03/2015 12:09 PM    ALK 115 (H) 01/03/2015 12:09 PM    TB 0.4 01/03/2015 12:09 PM    NA 145 01/03/2015 12:09 PM    K 4.8 01/03/2015 12:09 PM    UN 15 01/03/2015 12:09 PM    CREAT 0.93 01/03/2015 12:09 PM    CREAT 0.76 02/19/2014 11:13 PM         Radiology impressions (last 30 days):  Ct Chest Without Contrast    Result Date: 02/21/2015  Exam Site: [place] [facility] 02/20/2015 12:06 PM  CT CHEST WITHOUT CONTRAST  ORDERING CLINICAL INFORMATION:  ERECORD: LUNG NODULE, <1CM;Never smoker; follow up exam   (Requesting information provided directly from request entry system without change.)  ADDITIONAL CLINICAL INFORMATION NECESSARY FOR CONSULTATION:  None.   PROCEDURE:  Contiguous images were acquired through the thorax from the thoracic inlet through the upper abdomen.  Patient specific scan parameter selection was utilized to tailor the radiation dose to patient size.  Multiplanar reconstructions and axial slab MIP reformats were reviewed. Contrast media: None  COMPARISON:  CT angio chest 02/20/2014. CONCURRENT EXAM: None  FINDINGS:   Neck base:  Scattered lymph nodes, not enlarged by size criteria.  Mediastinum and lymph nodes:  Multiple small mediastinal lymph nodes, without enlargement. Cardiovascular structures: There are coronary artery calcifications and postoperative changes in the mediastinum with multiple clips.  Pleura and Pleural space:  No pleural effusion, no pneumothorax.    Airways (Trachea/Bronchi):  Patent. Lungs:  There is a left lower lung 6 x 4 mm nodule, stable in  size compared to 02/20/2014. Linear atelectasis of the bilateral lower lungs.  Upper abdomen:  Small hiatal hernia.  Colonic diverticuli noted. Soft Tissues/Musculoskeletal: No acute findings.       IMPRESSION:  Stable left lower lung 6 mm nodule. Per Fleischner guidelines below, with no change after 12 months in a low risk patient, no further follow-up is necessary. If high risk, additional follow-up chest CT recommended at 18-24 months after the initial chest CT.  Linear atelectasis of the bilateral lower lungs.  Stable small hiatal hernia.    Fleischner guidelines: For nodules in the 4-6 mm range, follow-up non-contrast Chest CT is recommended based on risk factors.  If low risk, consider a non-contrast chest CT at 12 months.  If there is no change after 12 months; no further follow-up is needed.  If high risk; follow-up non-contrast Chest CT is recommended between 6 and 12 months with another non-contrast chest CT 18-24 months after the initial chest CT.  If the nodule has not changed after the second follow-up CT; no further follow-up imaging is needed.  We recommend direct comparison with any outside CT evaluation prior to performing any follow-up recommendations.  Follow-up of incidental small pulmonary nodules in patients without history of prior neoplasm is based on Fleischner Society guidelines.  The complete document can be found at the following link: [email or web address]   I have personally reviewed the image(s) and the resident's interpretation and agree with or edited the findings.        Currently Active Problems:  Patient Active Problem List   Diagnosis Code    Dyspnea on exertion R06.09    Asthma J45.909    Severe Obstructive sleep apnea G47.33    Pulmonary nodule R91.1    Allergic rhinitis J30.9    CAD (coronary artery disease) I25.10    Prediabetes R73.03    Gastritis K29.70    Obesity E66.9    Acute otitis media with perforated tympanic membrane H66.90, H72.90          UPDATES TO PATIENT'S CONDITION on the DAY OF SURGERY/PROCEDURE    I. Updates to Patient's Condition (to be completed by a provider privileged to complete a H&P, following  reassessment of the patient by the provider):    Full H&P done today; no updates needed.    II. Procedure Readiness   I have reviewed the patient's H&P and updated condition. By completing and signing this form, I attest that this patient is ready for surgery/procedure.      III. Attestation   I have reviewed the updated information regarding the patient's condition and it is appropriate to proceed with the planned surgery/procedure.    Kelly Splinter, MD as of 9:16 AM 03/21/2015

## 2015-03-21 NOTE — Discharge Instructions (Addendum)
[place]  Discharge Instructions  For Colonoscopy    03/21/2015    9:29 AM      Findings:Polyps    Do not drive, operate heavy machinery, drink alcoholic beverages, make important personal or business decisions, or sign legal documents until next day.     Return to your usual diet.   Do not take aspirin, Advil, Motrin, or other anti-inflammatory medications for 7 days.    Things you May Expect:   A small amount of bright red blood in your stool.   It may be a few days before you have a bowel movement.   You may have cramping, bloating, and feeling of "gas". These feelings should go away as you pass gas. If you still feel uncomfortable, walking around will help to pass the gas.   You were given medication to help you relax during the test. You may feel "fuzzy" and drowsy. Go home and rest for at least 4-6 hours.    You Should Call You Doctor For any of the Following:     Bad stomach pain    Fever    Bright red bleeding or clots (This may happen up to 2 weeks after the test.)    Dizziness or weakness that gets worse or last up to 24 hours.    Pain or redness at the IV site  If you have a serious problem after hours, Call [phone] to reach the GI physician on call. If you are unable to reach your doctor, go to the [facility] Emergency Department.    Follow Up Care:  If biopsies were taken during your procedure, we will send you the pathology results within 14 days. If you do not receive your pathology results after 14 days please call [phone].    Results will be sent to your primary care doctor.    Repeat colonoscopy in 3 years.        Provider Signature________________________________________________________

## 2015-03-22 LAB — SURGICAL PATHOLOGY

## 2015-03-23 ENCOUNTER — Encounter: Payer: Self-pay | Admitting: Primary Care

## 2015-04-10 ENCOUNTER — Encounter: Payer: Self-pay | Admitting: Primary Care

## 2015-04-10 ENCOUNTER — Ambulatory Visit: Payer: Self-pay | Admitting: Primary Care

## 2015-04-10 VITALS — BP 140/78 | HR 66 | Temp 96.4°F | Resp 13 | Ht 67.0 in | Wt 290.5 lb

## 2015-04-10 DIAGNOSIS — F341 Dysthymic disorder: Secondary | ICD-10-CM

## 2015-04-10 DIAGNOSIS — I2510 Atherosclerotic heart disease of native coronary artery without angina pectoris: Secondary | ICD-10-CM

## 2015-04-10 DIAGNOSIS — R911 Solitary pulmonary nodule: Secondary | ICD-10-CM

## 2015-04-10 DIAGNOSIS — Z23 Encounter for immunization: Secondary | ICD-10-CM

## 2015-04-10 DIAGNOSIS — J4540 Moderate persistent asthma, uncomplicated: Secondary | ICD-10-CM

## 2015-04-10 DIAGNOSIS — K2970 Gastritis, unspecified, without bleeding: Secondary | ICD-10-CM

## 2015-04-10 DIAGNOSIS — R052 Subacute cough: Secondary | ICD-10-CM

## 2015-04-10 DIAGNOSIS — J301 Allergic rhinitis due to pollen: Secondary | ICD-10-CM

## 2015-04-10 DIAGNOSIS — E669 Obesity, unspecified: Secondary | ICD-10-CM

## 2015-04-10 LAB — PCMH FALL RISK PLAN

## 2015-04-10 LAB — PCMH DEPRESSION ASSESSMENT

## 2015-04-10 LAB — PCMH FALL RISK ASSESSMENT

## 2015-04-10 MED ORDER — ZOSTER VACCINE LIVE 19400 UNT/0.65ML SC SOLR *I*
19400.0000 [IU] | Freq: Once | SUBCUTANEOUS | 0 refills | Status: AC
Start: 2015-04-10 — End: 2015-04-10

## 2015-04-10 NOTE — Patient Instructions (Signed)
Call your insurance about the cost of Prevnar (pneumonia shot)

## 2015-04-10 NOTE — Progress Notes (Signed)
Outpatient Progress Note    SUBJECTIVE    [name]  is a 69 y.o. female  here for follow up visit.  Today we discussed:    CAD - no palpitations, CP or dyspnea on exertion     Continues to have high pitched buzzing in her R ear  It is less noticeable when there is distracting noise  She did have some impacted cerumen adherent to her TM removed by ENT    Asthma   She continues to have congestion  She is requiring her albuterol inhaler 2-3 times per week   No dyspnea     Mood has been down recently related to family stress  Lives in an in law suite at her son's place  She does not always agree with his parenting style, which is a challenge, as she feels they are too punitive   The kids lost their mother and they have a stepmother   She does not have any specific things that she does to cope  Her husband also privately reports a concern about her mood    Obesity - doing [place] with her husband. Reports being down 25 pounds in total     Sweating has improved     Her cough comes and goes. It has been worse in the past few weeks since a viral illness    She is on lamisil through a podiatrist on [place]     GERD - reports significant symptoms off the PPI     The patient's past medical history, problem list, medications and allergies reviewed in electronic chart today.      OBJECTIVE    Blood pressure 140/78, pulse 66, temperature 35.8 C (96.4 F), temperature source Temporal, resp. rate 13, height 1.702 m (5\' 7" ), weight 131.8 kg (290 lb 8 oz), SpO2 96 %.   Body mass index is 45.5 kg/(m^2).   Gen: NAD, appears well, pleasant  Psych: dysthymic affect    ASSESSMENT & PLAN  1. Coronary artery disease, angina presence unspecified, unspecified vessel or lesion type, unspecified whether native or transplanted heart  Stable, no angina or DOE    2. Moderate persistent asthma without complication  Mild increase in symptoms with a recent URI, but otherwise well controlled   Follow up scheduled with Dr. Tamala Julian in  Jan   Flu shot UTD    3. Pulmonary nodule  Stable on recent CT scan. No smoking history. No further f/u indicated    4. Non-seasonal allergic rhinitis due to pollen  F/u prn with Allergy at this point     5. Obesity, unspecified obesity severity, unspecified obesity type  Encouraged her excellent efforts at weight loss. Cont [employer]     6. Subacute cough  ?postviral cough vs uncontrolled asthma. Update CXR if this persists. It does sound like this improved for a time.     7. Gastritis, presence of bleeding unspecified, unspecified chronicity, unspecified gastritis type  Declines to consider PPI wean today     8. Immunization due  - zoster vaccine live, PF, (ZOSTAVAX) 60454 UNT/0.65ML injection; Inject 0.65 mLs (19,400 Units total) into the skin once   Immunization to be administered at pharmacy  Dispense: 0.65 mL; Refill: 0    Tinnitus diagnosis briefly reviewed   BP elevated today - f/u next visit if this remains elevated  Her living situation is stressful -- encouraged her to come back to evaluate her mood further if she feels she is struggling with this  Pt agrees with and understands plan.    Return in about 4 months (around 08/08/2015) for f/u BP and weight. Return to office sooner prn.     Verdene Lennert, MD

## 2015-04-11 ENCOUNTER — Other Ambulatory Visit: Payer: Self-pay | Admitting: Gastroenterology

## 2015-05-07 ENCOUNTER — Other Ambulatory Visit: Payer: Self-pay | Admitting: Gastroenterology

## 2015-05-07 ENCOUNTER — Other Ambulatory Visit: Payer: Self-pay

## 2015-05-08 DIAGNOSIS — R079 Chest pain, unspecified: Secondary | ICD-10-CM | POA: Insufficient documentation

## 2015-05-08 LAB — COMPREHENSIVE METABOLIC PANEL
ALT: 24 U/L (ref 10–49)
AST: 28 U/L (ref 7–37)
Albumin: 4 g/dL (ref 3.2–4.8)
Alk Phos: 154 U/L — ABNORMAL HIGH (ref 46–116)
Anion Gap: 8 mEq/L (ref 4–16)
Bilirubin,Total: 0.2 mg/dL — ABNORMAL LOW (ref 0.3–1.2)
CO2: 29 mEq/L (ref 20–31)
Calcium: 9.5 mg/dL (ref 8.5–10.4)
Chloride: 107 mEq/L (ref 98–108)
Creatinine: 0.8 mg/dL (ref 0.5–0.9)
Globulin: 2.9 g/dL (ref 2.4–4.3)
Glucose: 82 mg/dL (ref 65–100)
Lab: 18 mg/dL (ref 8–20)
Potassium: 4.9 mEq/L (ref 3.5–5.1)
Sodium: 144 mEq/L (ref 135–145)
Total Protein: 6.9 g/dL (ref 6.4–8.5)

## 2015-05-08 LAB — TROPONIN T
Troponin T: <0.01 ng/mL (ref 0.00–0.09)
Troponin T: <0.01 ng/mL (ref 0.00–0.09)

## 2015-05-08 LAB — CBC AND DIFFERENTIAL
Baso # K/uL: 0.1 10*3/uL (ref 0.0–0.2)
Basophil %: 1 % (ref 0–3)
Eos # K/uL: 0.2 10*3/uL (ref 0.0–0.6)
Eosinophil %: 2 % (ref 0–5)
Hematocrit: 43 % (ref 35–47)
Hemoglobin: 13.8 g/dL (ref 12.0–16.0)
Lymph # K/uL: 2.8 10*3/uL (ref 1.0–4.8)
Lymphocyte %: 27 % (ref 15–45)
MCH: 29.2 pg (ref 26.0–34.0)
MCHC: 32.2 g/dL (ref 31.0–37.5)
MCV: 91 fL (ref 80–100)
Mono # K/uL: 0.7 10*3/uL (ref 0.1–1.0)
Monocyte %: 7 % (ref 0–15)
Neut # K/uL: 6.7 10*3/uL (ref 1.8–8.0)
Platelets: 289 10*3/uL (ref 150–450)
RBC: 4.73 10*6/uL (ref 3.80–5.20)
RDW: 14 % (ref 0.0–15.2)
Seg Neut %: 64 % (ref 45–75)
WBC: 10.5 10*3/uL (ref 4.0–11.0)

## 2015-05-08 LAB — ESTIMATED GFR
GFR,Black: >60 mL/min
GFR,Caucasian: >60 mL/min

## 2015-05-08 LAB — D-DIMER, QUANTITATIVE: D-Dimer: 960 FEU ng/mL — ABNORMAL HIGH (ref <500)

## 2015-05-09 ENCOUNTER — Encounter: Payer: Self-pay | Admitting: Gastroenterology

## 2015-05-09 LAB — CBC AND DIFFERENTIAL
Baso # K/uL: 0.1 10*3/uL (ref 0.0–0.2)
Basophil %: 1 % (ref 0–3)
Eos # K/uL: 0.3 10*3/uL (ref 0.0–0.6)
Eosinophil %: 3 % (ref 0–5)
Hematocrit: 42 % (ref 35–47)
Hemoglobin: 13.6 g/dL (ref 12.0–16.0)
Lymph # K/uL: 3.3 10*3/uL (ref 1.0–4.8)
Lymphocyte %: 36 % (ref 15–45)
MCH: 29.1 pg (ref 26.0–34.0)
MCHC: 32.2 g/dL (ref 31.0–37.5)
MCV: 90 fL (ref 80–100)
Mono # K/uL: 0.6 10*3/uL (ref 0.1–1.0)
Monocyte %: 6 % (ref 0–15)
Neut # K/uL: 4.9 10*3/uL (ref 1.8–8.0)
Platelets: 302 10*3/uL (ref 150–450)
RBC: 4.68 10*6/uL (ref 3.80–5.20)
RDW: 14.3 % (ref 0.0–15.2)
Seg Neut %: 54 % (ref 45–75)
WBC: 9.1 10*3/uL (ref 4.0–11.0)

## 2015-05-09 LAB — BASIC METABOLIC PANEL
Anion Gap: 7 mEq/L (ref 4–16)
CO2: 29 mEq/L (ref 20–31)
Calcium: 9.6 mg/dL (ref 8.5–10.4)
Chloride: 108 mEq/L (ref 98–108)
Creatinine: 0.9 mg/dL (ref 0.5–0.9)
Glucose: 98 mg/dL (ref 65–100)
Lab: 13 mg/dL (ref 8–20)
Potassium: 4.9 mEq/L (ref 3.5–5.1)
Sodium: 144 mEq/L (ref 135–145)

## 2015-05-09 LAB — ESTIMATED GFR
GFR,Black: >60 mL/min
GFR,Caucasian: >60 mL/min

## 2015-05-10 LAB — CBC AND DIFFERENTIAL
Baso # K/uL: 0.1 10*3/uL (ref 0.0–0.2)
Basophil %: 1 % (ref 0–3)
Eos # K/uL: 0.3 10*3/uL (ref 0.0–0.6)
Eosinophil %: 3 % (ref 0–5)
Hematocrit: 39 % (ref 35–47)
Hemoglobin: 12.4 g/dL (ref 12.0–16.0)
Lymph # K/uL: 2.9 10*3/uL (ref 1.0–4.8)
Lymphocyte %: 32 % (ref 15–45)
MCH: 29 pg (ref 26.0–34.0)
MCHC: 32 g/dL (ref 31.0–37.5)
MCV: 91 fL (ref 80–100)
Mono # K/uL: 0.6 10*3/uL (ref 0.1–1.0)
Monocyte %: 7 % (ref 0–15)
Neut # K/uL: 5.3 10*3/uL (ref 1.8–8.0)
Platelets: 275 10*3/uL (ref 150–450)
RBC: 4.28 10*6/uL (ref 3.80–5.20)
RDW: 14.3 % (ref 0.0–15.2)
Seg Neut %: 58 % (ref 45–75)
WBC: 9.1 10*3/uL (ref 4.0–11.0)

## 2015-05-10 LAB — BASIC METABOLIC PANEL
Anion Gap: 8 mEq/L (ref 4–16)
CO2: 28 mEq/L (ref 20–31)
Calcium: 9.3 mg/dL (ref 8.5–10.4)
Chloride: 107 mEq/L (ref 98–108)
Creatinine: 0.9 mg/dL (ref 0.5–0.9)
Glucose: 100 mg/dL (ref 65–100)
Lab: 14 mg/dL (ref 8–20)
Potassium: 4.4 mEq/L (ref 3.5–5.1)
Sodium: 143 mEq/L (ref 135–145)

## 2015-05-10 LAB — ESTIMATED GFR
GFR,Black: >60 mL/min
GFR,Caucasian: >60 mL/min

## 2015-05-16 ENCOUNTER — Ambulatory Visit: Payer: Self-pay | Admitting: Primary Care

## 2015-05-16 ENCOUNTER — Encounter: Payer: Self-pay | Admitting: Primary Care

## 2015-05-16 VITALS — BP 106/74 | HR 76 | Ht 67.01 in | Wt 285.1 lb

## 2015-05-16 DIAGNOSIS — F419 Anxiety disorder, unspecified: Secondary | ICD-10-CM

## 2015-05-16 DIAGNOSIS — Z09 Encounter for follow-up examination after completed treatment for conditions other than malignant neoplasm: Secondary | ICD-10-CM

## 2015-05-16 MED ORDER — SERTRALINE HCL 50 MG PO TABS *I*
50.0000 mg | ORAL_TABLET | Freq: Every day | ORAL | 5 refills | Status: DC
Start: 2015-05-16 — End: 2015-11-07

## 2015-05-16 NOTE — Progress Notes (Signed)
Outpatient Progress Note    SUBJECTIVE    [name]  is a 69 y.o. female  here for follow up visit.  Today we discussed:    05/07/15 went to [facility] for Chest Pain.  Was ruled out for Cardiac, PE.  Was determined to be "stress/Anxiety".    Patient states that "home life has been stressful", constantly feeling nervous, unable to stop worrying.  Would like to start medicine for Anxiety.    GAD7: 63    Dr. Ritter,Cardiology.   appointment next week.      The patient's past medical history, problem list, medications and allergies reviewed in electronic chart today.      OBJECTIVE    Blood pressure 106/74, pulse 76, height 1.702 m (5' 7.01"), weight 129.3 kg (285 lb 1.6 oz).   Body mass index is 44.64 kg/(m^2).   Gen: NAD, appears well, pleasant  Lymph: No cervical lymphadenopathy  Lungs: clear to auscultation bilaterally with no wheezes, rales, or rhonchi  CV: RRR, normal S1,S2, no M/G/R  Extremities: warm and well perfused, no edema  Psych: normal affect      ASSESSMENT & PLAN    1. Anxiety  START  - sertraline (ZOLOFT) 50 MG tablet; Take 1 tablet (50 mg total) by mouth daily  Dispense: 30 tablet; Refill: 5  Went over risks and benefits of medicine, black box warnings.    2. Hospital discharge follow-up  Stable      Pt agrees with and understands plan.    Return in about 1 month (around 06/16/2015) for Follow-up anxiety with Dr. Adella Nissen. Return to office sooner prn.     Matthew Saras, [place]

## 2015-05-24 ENCOUNTER — Encounter: Payer: Self-pay | Admitting: Gastroenterology

## 2015-06-07 ENCOUNTER — Encounter: Payer: Self-pay | Admitting: Pulmonology

## 2015-06-07 ENCOUNTER — Ambulatory Visit: Payer: Self-pay

## 2015-06-07 ENCOUNTER — Ambulatory Visit: Payer: Self-pay | Admitting: Pulmonology

## 2015-06-07 VITALS — BP 150/78 | HR 68 | Temp 96.8°F | Resp 18 | Ht 67.0 in | Wt 285.0 lb

## 2015-06-07 DIAGNOSIS — J449 Chronic obstructive pulmonary disease, unspecified: Secondary | ICD-10-CM

## 2015-06-07 NOTE — Patient Instructions (Signed)
Continue Symbicort, Spiriva, CPAP, Ventolin and Singulair  You do not need any further chest CTs  I am very impressed with your weight loss and exercise!! Keep up the good work!!  See you in 1 year with a breathing test.  Happy to see you sooner if necessary -- just call.  Get the flu shot in the fall 2017

## 2015-06-07 NOTE — Progress Notes (Signed)
Dear Doctor Verdene Lennert, MD:      [name] was seen in Pulmonary Clinic on 06/07/2015 in follow up for shortness of breath, morbid obesity, asthma and OSA.   As you know, [name] is a 70 y.o. female patient who was last seen in July 2016.  I added Spiriva at that visit.     Since our last correspondence, [name] had a follow up chest CT that showed stable pulmonary nodules.  Earlier this month she was seen at [facility] with chest pain and a positive D-dimer and had a CT angio that demonstrated, by report, a 5-mm LLL pulmonary nodule and no PE.  She said the doctors told her that the symptoms were from stress.     Today [name] reports the Spiriva is working well. She says she can walk a little further than before.   She continues to be short of breath with stairs.  She is using Symbicort, Spiriva and Ventolin.  She is using the Ventolin about three or four days out of 7.   She takes Singulair at night.     She has reflux disease and she takes Protonix daily. She still has not instituted lifestyle measures for her reflux.      [name] denies fever and chills. She has some congestion and cough.  She had the seasonal influenza vaccine.     [name] continues to use CPAP nightly. She sleeps through the night and feels rested during the day.     She has lost 31 pounds since I last saw her.  She and her husband are doing Weight Watchers [place] [facility]     Medications were reconciled in the EMR.  Pulmonary medications as indicated above.      On my exam,Blood pressure 150/78, pulse 68, temperature 36 C (96.8 F), temperature source Temporal, resp. rate 18, height 1.702 m (5\' 7" ), weight 129.3 kg (285 lb), SpO2 96 %.  No acute distress. HEENT exam notable for anicteric sclera.  Oropharynx clear with no evidence of thrush. There was no palpable cervical or supraclavicular adenopathy. Cardiac tones were normal with no murmur, gallop or rub.  Lung exam revealed good air entry  bilaterally with no rales, rhonchi or wheezing.  Abdomen was soft and nontender with no palpable masses.  There was no lower extremity edema. She was alert and oriented and had a grossly nonfocal neurological exam.    Spirometry was performed at this visit and revealed a reduced FEV1/FVC ratio of 64, with a moderately reduced FEV1 of 1.42 liters (53% of predicted) and a moderately reduced FVC of 2.21 liters (62% of predicted).  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     Overnight oximetry was done on CPAP and room air in January 2016 and there was no need for supplemental oxygen.    Chest CT in October 2016 showed a stable LLL nodule.     Impression and Recommendations: [name] is a 69 y.o. female patient with dyspnea/asthma, morbid obesity and OSA.   Her spirometry is stable and she is using her CPAP.  [name] has lost weight and continues to exercise.  Pulmonary nodule is stable over several exams.  She is a lifelong nonsmoker.     At this time my recommendations include:    1.  Continue Symbicort, Sprivia, Singulair, albuterol and CPAP.     2.  Continue exercise and weight loss.  3.  By Robin Searing criteria for a low-risk patient, there is no need for further imaging for the pulmonary nodule.   4.  Follow up with me in 1 year with spirometry.  I am happy to see her sooner if necessary.   5.  Influenza vaccine in the fall when available.     Thank you for allowing me to continue in the care of [name].  Please do not hesitate to contact me if you have any questions or comments.       Sincerely,    Maurine Simmering, MD  Pulmonary and Critical Care Medicine

## 2015-06-16 ENCOUNTER — Ambulatory Visit: Payer: Self-pay | Admitting: Primary Care

## 2015-06-16 ENCOUNTER — Encounter: Payer: Self-pay | Admitting: Primary Care

## 2015-06-16 VITALS — BP 130/68 | HR 64 | Ht 67.0 in | Wt 284.5 lb

## 2015-06-16 DIAGNOSIS — F419 Anxiety disorder, unspecified: Secondary | ICD-10-CM

## 2015-06-16 LAB — PCMH FALL RISK PLAN

## 2015-06-16 LAB — PCMH FALL RISK ASSESSMENT

## 2015-06-16 NOTE — Progress Notes (Signed)
Outpatient Progress Note    SUBJECTIVE    [name]  is a 70 y.o. female  here for follow up visit.  Today we discussed:    F/u anxiety   Started 50mg  of zoloft one month ago. Feeling better now - moved into an apartment so they aren't in the in law suite, which has been good. No SE with the medication, including nausea or dizziness     COPD stable on recent Pulmonology evaluation. No further f/u recommended for the nodule as she does not have risk factors.     The patient's past medical history, problem list, medications and allergies reviewed in electronic chart today.      OBJECTIVE    Blood pressure 130/68, pulse 64, height 1.702 m (5\' 7" ), weight 129 kg (284 lb 8 oz).   Body mass index is 44.56 kg/(m^2).   Gen: NAD, appears well, pleasant  Psych: normal affect    ASSESSMENT & PLAN  1. Anxiety  Good response to 50mg  sertraline without SE - cont this dose, will plan on ~6 months of treatment pending ongoing symptoms remission. Moving into a new apartment has also been helpful. She will call for symptomatic worsening or SE.     Pt agrees with and understands plan.    F/u in three months with me for routine care     Verdene Lennert, MD

## 2015-08-03 ENCOUNTER — Ambulatory Visit: Payer: Self-pay | Admitting: Primary Care

## 2015-08-22 ENCOUNTER — Other Ambulatory Visit: Payer: Self-pay | Admitting: Pulmonology

## 2015-08-22 DIAGNOSIS — J449 Chronic obstructive pulmonary disease, unspecified: Secondary | ICD-10-CM

## 2015-09-04 ENCOUNTER — Other Ambulatory Visit: Payer: Self-pay | Admitting: Primary Care

## 2015-09-04 MED ORDER — METOPROLOL TARTRATE 25 MG PO TABS *I*
12.5000 mg | ORAL_TABLET | Freq: Two times a day (BID) | ORAL | 1 refills | Status: DC
Start: 2015-09-04 — End: 2016-03-08

## 2015-09-04 NOTE — Telephone Encounter (Signed)
Received fax request from pt's pharmacy requesting refill for pt's metoprolol (LOPRESSOR) 25 MG tablet. Please advise. Thanks

## 2015-09-14 ENCOUNTER — Encounter: Payer: Self-pay | Admitting: Primary Care

## 2015-09-14 ENCOUNTER — Ambulatory Visit: Payer: Self-pay | Admitting: Primary Care

## 2015-09-14 VITALS — BP 120/70 | HR 72 | Resp 22 | Ht 67.0 in | Wt 277.0 lb

## 2015-09-14 DIAGNOSIS — G4733 Obstructive sleep apnea (adult) (pediatric): Secondary | ICD-10-CM

## 2015-09-14 DIAGNOSIS — E669 Obesity, unspecified: Secondary | ICD-10-CM

## 2015-09-14 DIAGNOSIS — R7303 Prediabetes: Secondary | ICD-10-CM

## 2015-09-14 DIAGNOSIS — F341 Dysthymic disorder: Secondary | ICD-10-CM

## 2015-09-14 DIAGNOSIS — I2510 Atherosclerotic heart disease of native coronary artery without angina pectoris: Secondary | ICD-10-CM

## 2015-09-14 DIAGNOSIS — K2970 Gastritis, unspecified, without bleeding: Secondary | ICD-10-CM

## 2015-09-14 DIAGNOSIS — K439 Ventral hernia without obstruction or gangrene: Secondary | ICD-10-CM

## 2015-09-14 DIAGNOSIS — J4540 Moderate persistent asthma, uncomplicated: Secondary | ICD-10-CM

## 2015-09-14 LAB — BASIC METABOLIC PANEL
Anion Gap: 15 (ref 7–16)
CO2: 26 mmol/L (ref 20–28)
Calcium: 8.8 mg/dL (ref 8.6–10.2)
Chloride: 103 mmol/L (ref 96–108)
Creatinine: 0.77 mg/dL (ref 0.51–0.95)
GFR,Black: 91 *
GFR,Caucasian: 79 *
Glucose: 120 mg/dL — ABNORMAL HIGH (ref 60–99)
Lab: 15 mg/dL (ref 6–20)
Potassium: 4.2 mmol/L (ref 3.3–5.1)
Sodium: 144 mmol/L (ref 133–145)

## 2015-09-14 LAB — MAGNESIUM: Magnesium: 1.6 mEq/L (ref 1.3–2.1)

## 2015-09-14 LAB — PCMH FALL RISK PLAN

## 2015-09-14 LAB — PCMH FALL RISK ASSESSMENT

## 2015-09-14 MED ORDER — PANTOPRAZOLE SODIUM 40 MG PO TBEC *I*
40.0000 mg | DELAYED_RELEASE_TABLET | Freq: Every day | ORAL | 1 refills | Status: DC
Start: 2015-09-14 — End: 2016-01-22

## 2015-09-14 NOTE — Progress Notes (Signed)
Outpatient Progress Note    SUBJECTIVE    [name]  is a 70 y.o. female  here for follow up visit.  Today we discussed:    Asthma - breathing is ok - some dyspnea at times but generally stable. No changes to her regimen at her recent Pulmonology visit. Thinks she had PVX in 2013, with her CABG    Obesity - Down weight on Weight Watchers. Goes to the exercise room at their complex   Keeps cake out of the house. Wondering about a bulge she notices when lying flat.     Notes R sided abdominal bulging with lying flat. Not painful.     GERD - symptoms stable on PPI     CAD - CP comes and goes. Not taking NTG     OSA - adherent with CPAP. Feels terrible while off this     Dysthymia - Doing great with mood on zoloft    Went to [place] to visit her cousin     The patient's past medical history, problem list, medications and allergies reviewed in electronic chart today.      OBJECTIVE    Blood pressure 120/70, pulse 72, resp. rate 22, height 1.702 m (5\' 7" ), weight 125.6 kg (277 lb).   Body mass index is 43.38 kg/(m^2).   Gen: NAD, appears well, pleasant  Lungs: clear to auscultation bilaterally with no wheezes, rales, or rhonchi  CV: RRR, normal S1,S2, no M/G/R  Abd: Soft, NT, ND. Firm, large midline bulge extending from the umbilicus to the rib cage, more prominent while lying flat   Psych: normal affect    ASSESSMENT & PLAN  1. Moderate persistent asthma without complication  Breathing stable, lungs clear today. No changes.   - Pneumococcal conjugate vaccine 13-valent    2. Obesity, unspecified obesity severity, Prediabetes  Doing great with weight watchers. Encouraged her ongoing efforts at weight loss. [facility]   - Hemoglobin A1c; Future  - Basic metabolic panel; Future    3. Gastritis, presence of bleeding unspecified, unspecified chronicity, unspecified gastritis type  Cont ppi. Discusswean next visit  - pantoprazole (PROTONIX) 40 MG EC tablet; Take 1 tablet (40 mg total) by mouth daily    Swallow whole. Do not crush, break, or chew.  Dispense: 90 tablet; Refill: 1  - Magnesium; Future    4. Severe Obstructive sleep apnea  Adherent with CPAP with good symptomatic relief.     5. Coronary artery disease, angina presence unspecified, unspecified vessel or lesion type, unspecified whether native or transplanted heart  Occasional anginal symptoms. On a CAD regimen including 40mg  atorvastatin, 12.5mg  lopressor BID, aspirin 81mg  daily and plavix. Followed by Dr. Kerin Ransom    6. Ventral hernia without obstruction or gangrene  - AMB REFERRAL TO SURGERY    7. Dysthymia  Mood stable on zoloft, continue with this     Due for prevnar - given today   PVX in 2018     Pt agrees with and understands plan.    Return in about 6 months (around 03/16/2016) for f/u weight. Return to office sooner prn.     Verdene Lennert, MD

## 2015-09-15 LAB — HEMOGLOBIN A1C: Hemoglobin A1C: 6.2 % — ABNORMAL HIGH (ref 4.0–6.0)

## 2015-09-17 ENCOUNTER — Encounter: Payer: Self-pay | Admitting: Primary Care

## 2015-09-22 ENCOUNTER — Encounter: Payer: Self-pay | Admitting: Surgery

## 2015-09-22 ENCOUNTER — Ambulatory Visit: Payer: Medicare (Managed Care) | Attending: Surgery | Admitting: Surgery

## 2015-09-22 VITALS — BP 131/72 | HR 71 | Temp 95.7°F | Resp 16 | Ht 67.0 in | Wt 274.8 lb

## 2015-09-22 DIAGNOSIS — M6208 Separation of muscle (nontraumatic), other site: Secondary | ICD-10-CM

## 2015-09-25 ENCOUNTER — Other Ambulatory Visit: Payer: Self-pay | Admitting: Pulmonology

## 2015-09-25 DIAGNOSIS — J449 Chronic obstructive pulmonary disease, unspecified: Secondary | ICD-10-CM

## 2015-09-25 MED ORDER — TIOTROPIUM BROMIDE MONOHYDRATE 18 MCG IN CAPS *I*
18.0000 ug | ORAL_CAPSULE | Freq: Every day | RESPIRATORY_TRACT | 11 refills | Status: DC
Start: 2015-09-25 — End: 2016-06-05

## 2015-09-25 MED ORDER — MONTELUKAST SODIUM 10 MG PO TABS *I*
10.0000 mg | ORAL_TABLET | Freq: Every evening | ORAL | 6 refills | Status: DC
Start: 2015-09-25 — End: 2016-05-07

## 2015-10-11 NOTE — H&P (Signed)
History and Physical    HISTORY:  Chief Complaint   Patient presents with    Advice Only     hernia         History of Present Illness:    HPI Comments: Referred for evaluaiton of a possible ventral hernia. She says that she notices a bulge when she is lying flat and trying to rise up. She says the area is sore. She denies any obstruction. She has COPD. She denies any systemic steroids.       Problems:  Patient Active Problem List   Diagnosis Code    Dyspnea on exertion R06.09    Asthma J45.909    Severe Obstructive sleep apnea G47.33    Pulmonary nodule R91.1    Allergic rhinitis J30.9    CAD (coronary artery disease) I25.10    Prediabetes R73.03    Gastritis K29.70    Obesity E66.9    Acute otitis media with perforated tympanic membrane H66.90, H72.90    Dysthymia F34.1        Past Medical/Surgical History:   Past Medical History:   Diagnosis Date    Arthritis     Asthma     CAD (coronary artery disease)     DVT (deep venous thrombosis)     post-op after CABG, on anticoagulation for ~2 years    PE (pulmonary embolism)     post-op after CABG, on anticoagulation for ~2 years    Prediabetes     Tubular adenoma 03/2015    colonoscopy      Past Surgical History:   Procedure Laterality Date    APPENDECTOMY      CHOLECYSTECTOMY      CORONARY ANGIOPLASTY WITH STENT PLACEMENT      CORONARY ARTERY BYPASS GRAFT  2013    triple     CYST REMOVAL      both thumbs    TONSILLECTOMY AND ADENOIDECTOMY           Allergies:    Allergies   Allergen Reactions    Morphine Hives    Penicillins Hives       Current medications:    Current Outpatient Prescriptions   Medication Sig    pantoprazole (PROTONIX) 40 MG EC tablet Take 1 tablet (40 mg total) by mouth daily   Swallow whole. Do not crush, break, or chew.    metoprolol (LOPRESSOR) 25 MG tablet Take 0.5 tablets (12.5 mg total) by mouth 2 times daily    sertraline (ZOLOFT) 50 MG tablet Take 1 tablet (50 mg total) by mouth daily    budesonide-formoterol  (SYMBICORT) 160-4.5 MCG/ACT inhaler Inhale 2 puffs into the lungs 2 times daily   Shake well before each use.    fluticasone (FLONASE) 50 MCG/ACT nasal spray INHALE 2 SPRAYS INTO EACH NOSTRIL EVERY DAY    atorvastatin (LIPITOR) 40 MG tablet Take 40 mg by mouth daily (with dinner)    Cholecalciferol (VITAMIN D3) 5000 UNITS TABS Take by mouth daily    aspirin 81 MG tablet Take 81 mg by mouth daily    Albuterol (VENTOLIN IN) Inhale into the lungs    docusate sodium (COLACE) 100 MG capsule Take 200 mg by mouth daily    clopidogrel (PLAVIX) 75 MG tablet Take 75 mg by mouth daily    montelukast (SINGULAIR) 10 MG tablet Take 1 tablet (10 mg total) by mouth nightly    tiotropium (SPIRIVA HANDIHALER) 18 MCG inhalation capsule Inhale 1 capsule (18 mcg total) into the lungs daily  azelastine (ASTELIN) 0.1 % nasal spray 1 spray by Nasal route 2 times daily    Sodium Chloride-Sodium Bicarb (AYR SALINE NASAL NETI RINSE) 1.57 G PACK 1 packet by Nasal route daily    ipratropium-albuterol (DUONEB) 0.5-2.5 (3) MG/3ML nebulizer solution Take 3 mLs by nebulization every 6 hours    loratadine (KLS ALLERCLEAR) 10 MG tablet Take 20 mg by mouth daily          Family History:    Family History   Problem Relation Age of Onset    Other Mother      "rare lung disease"    Aneurysm Mother      abdominal aortic aneurysm    Diabetes Type 2 Mother     Breast cancer Mother     Stroke Father     Stroke Brother      x2       Social/Occupational History:   Social History     Social History    Marital status: Married     Spouse name: N/A    Number of children: N/A    Years of education: N/A     Social History Main Topics    Smoking status: Never Smoker    Smokeless tobacco: Never Used    Alcohol use No    Drug use: No    Sexual activity: Not Asked     Other Topics Concern    None     Social History Narrative    May 2016        Lives with her husband in an in law suite at her son's house        Three children, all live  locally     Five grandchildren     Retired from school food service         Review of Systems:    Review of Systems   All other systems reviewed and are negative.      Vital Signs:   Visit Vitals    BP 131/72    Pulse 71    Temp 35.4 C (95.7 F)    Resp 16    Ht 1.702 m (5\' 7" )    Wt 124.6 kg (274 lb 12.8 oz)    SpO2 96%    BMI 43.04 kg/m2         PHYSICAL EXAM:  Physical Exam   Constitutional: She is oriented to person, place, and time. She appears well-developed and well-nourished. No distress.   HENT:   Head: Normocephalic and atraumatic.   Mouth/Throat: Oropharynx is clear and moist.   Eyes: EOM are normal. Pupils are equal, round, and reactive to light. No scleral icterus.   Neck: Normal range of motion. Neck supple.   Cardiovascular: Normal rate, regular rhythm and normal heart sounds.    Pulmonary/Chest: Effort normal and breath sounds normal. No respiratory distress.   Abdominal: Soft. Bowel sounds are normal. She exhibits no distension and no mass. There is no tenderness. There is no rebound and no guarding. No hernia.       Diastasis recti   Musculoskeletal: Normal range of motion. She exhibits no edema.   Lymphadenopathy:     She has no cervical adenopathy.   Neurological: She is alert and oriented to person, place, and time.   Skin: Skin is warm and dry. No erythema.   Psychiatric: She has a normal mood and affect.   Nursing note and vitals reviewed.  Assessment:    [name] was seen today for advice only.    Diagnoses and all orders for this visit:    Diastasis recti       .      Plan:        I had a long discussion with the pat about the nature and anatomy of the abdominal wall. We discussed abdominal wall hernias. We talked about the difference between a hernia and diastasis recti. She appears to have abdominal diastasis. I told her that there is no need for surgical intervention for this. We talked about some ways of mitigating her complaints, including strengthening exercises. All of  her questions were answered. She will follow up as needed.

## 2015-10-20 NOTE — Telephone Encounter (Signed)
Records received

## 2015-11-07 ENCOUNTER — Other Ambulatory Visit: Payer: Self-pay | Admitting: Primary Care

## 2015-11-07 DIAGNOSIS — F419 Anxiety disorder, unspecified: Secondary | ICD-10-CM

## 2015-11-28 ENCOUNTER — Encounter: Payer: Self-pay | Admitting: Gastroenterology

## 2015-12-11 ENCOUNTER — Other Ambulatory Visit: Payer: Self-pay | Admitting: Primary Care

## 2015-12-11 DIAGNOSIS — J4540 Moderate persistent asthma, uncomplicated: Secondary | ICD-10-CM

## 2015-12-22 ENCOUNTER — Ambulatory Visit: Payer: Self-pay | Admitting: Primary Care

## 2015-12-22 ENCOUNTER — Ambulatory Visit
Admission: RE | Admit: 2015-12-22 | Discharge: 2015-12-22 | Disposition: A | Discharge status: Home / Self Care | Payer: Medicare (Managed Care) | Source: Ambulatory Visit | Attending: Primary Care | Admitting: Primary Care

## 2015-12-22 ENCOUNTER — Telehealth: Payer: Self-pay | Admitting: Primary Care

## 2015-12-22 ENCOUNTER — Encounter: Payer: Self-pay | Admitting: Primary Care

## 2015-12-22 VITALS — BP 138/76 | HR 76 | Resp 24 | Ht 67.0 in | Wt 269.0 lb

## 2015-12-22 DIAGNOSIS — Y929 Unspecified place or not applicable: Secondary | ICD-10-CM | POA: Insufficient documentation

## 2015-12-22 DIAGNOSIS — S0993XA Unspecified injury of face, initial encounter: Secondary | ICD-10-CM

## 2015-12-22 DIAGNOSIS — W19XXXA Unspecified fall, initial encounter: Secondary | ICD-10-CM

## 2015-12-22 DIAGNOSIS — S0990XA Unspecified injury of head, initial encounter: Secondary | ICD-10-CM

## 2015-12-22 DIAGNOSIS — X58XXXA Exposure to other specified factors, initial encounter: Secondary | ICD-10-CM | POA: Insufficient documentation

## 2015-12-22 DIAGNOSIS — S199XXA Unspecified injury of neck, initial encounter: Secondary | ICD-10-CM

## 2015-12-22 DIAGNOSIS — S0292XA Unspecified fracture of facial bones, initial encounter for closed fracture: Secondary | ICD-10-CM

## 2015-12-22 MED ORDER — METHYLPREDNISOLONE 4 MG PO TBPK *A*
ORAL_TABLET | ORAL | 0 refills | Status: DC
Start: 2015-12-22 — End: 2016-01-04

## 2015-12-22 MED ORDER — CLINDAMYCIN HCL 300 MG PO CAPS *I*
300.0000 mg | ORAL_CAPSULE | Freq: Three times a day (TID) | ORAL | 0 refills | Status: AC
Start: 2015-12-22 — End: 2015-12-29

## 2015-12-22 NOTE — Telephone Encounter (Signed)
Called pt to verify medication allergies? Pt said Penicillin. What symptoms ? She had  Hives. Per pt they where" severe hives"  How long ago ? Greater the 20 years ago. Asked what antibiotic she has taken in the past ? She has taken z-pak she is not sure  the name of other ones she has taken. she did say  They avoid " cillin's

## 2015-12-22 NOTE — Telephone Encounter (Signed)
Writer called [employer] to get auth for CT Head w/o Contrast CPT: [id number] and CT Maxillofacial w/o Contrast CPT: U6968485 DX: S19.9XXA    S09.93XA     S09.90XA. Auth was granted for CT Head w/o Contrast [id number] exp 9/25. CPT: [phone] CT Maxillofacial w/o contrast went to medical review. Writer called [employer] to get this processed ASAP. Based on clinical review was able to get Auth. Auth is [id number] exp 02/05/16. Writer called [facility] to give them auths and to make sure they know to read these CTs STAT.

## 2015-12-22 NOTE — Progress Notes (Signed)
Outpatient Progress Note    SUBJECTIVE    [name]  is a 70 y.o. female  here for an ACUTE visit.  Today we discussed:    Visit time at 10am    Last night at 11:30pm turned light off in living room turned around and tripped over a stool.  [address] and hit right side of face on glass TV stand.  Glass did not break.  Felt dazed and foggy.  Did not lose consciousness.  Had a bad night.  No vomiting.  No HA's, only pain at sight of trauma and neck soreness.  Took tylenol  Now notes bruising under right eye.  Associated numbness into right upper lip.  New runny nose out of right side      The patient's past medical history, problem list, medications and allergies reviewed in electronic chart today.      OBJECTIVE    Blood pressure 138/76, pulse 76, resp. rate 24, height 1.702 m (5\' 7" ), weight 122 kg (269 lb).   Body mass index is 42.13 kg/(m^2).   Gen: NAD, appears well, pleasant  HEENT: anicteric, ecchymosis under right eye, +TTP along right zygoma, unable to appreciate step-off but patient intolerant of significant palpation.  Reports decreased sensation to light touch along right side of upper lip  Neck: no thyromegaly  Lymph: No cervical lymphadenopathy  Lungs: clear to auscultation bilaterally with no wheezes, rales, or rhonchi  CV: RRR, normal S1,S2, no M/G/R  Extremities: warm and well perfused, no edema  Neuro: A+O x 3, CN 2-12 grossly intact, PERRL+, no sensory/motor deficits in extremities, rapid alternating movements wnl, normal finger to nose, no tremor, neg Romberg/Pronator drift, DTR's 2+ in all 4 extremities, neg Babinski, no clonus, gait normal    I,Earl Zellmer, MD,certify that the patient has authorized the use of photography for the purpose of the provision of health care at the [facility] and affiliates and they understand that it will be included in the legal medical record.            ASSESSMENT & PLAN    1. Head, face & neck injury, initial  encounter  Significant concern for facial fracture.  Less so for intracranial hemorrhage but will check CT of head since facial CT being done.  Currently on plavix and ASA.  - CT maxillofacial without contrast; Future  - CT head without contrast; Future    2. Fall, initial encounter  H/o 1 previous fall 3 yrs ago.  This fall mechanical in nature due to environment.    Pt agrees with and understands plan.    Return if symptoms worsen or fail to improve. Return to office sooner prn.     Murrell Converse, MD

## 2015-12-22 NOTE — Telephone Encounter (Signed)
Received call from radiologist reporting neg head CT.  Facial CT shows:  Fractures at the right lamina papyracea and right inferior    orbital wall better visualized on CT facial bones. Mild hemorrhagic    fluid in the right ethmoid air cells and right maxillary sinus.     Paged [facility], spoke with Dr. Marjory Lies Standing reviewed case.  Received recommendation to start medrol dose pack and clindamycin (severe PCN allergy) x 7days.  Then f/u at [facility] on 8/18 at 2pm.    Info given to patient as below with warning signs regarding any visual changes.  Murrell Converse, MD

## 2015-12-22 NOTE — Telephone Encounter (Signed)
Per Dr Rich Reining writer called pt ,she is still at the hospital and has been told that she has a facial fracture, Appt scheduled with Oral and Maxillofacial surgery clinic next Friday 12/29/15 at 2:00 pm. Told pt we will send specific appt information to her via my chart. Dr Rich Reining will be send prescriptions to her pharmacy for an antibiotic and a steroid. If she has any visual changes before her appt she should go to Ed. Pt verbalized understanding.

## 2015-12-23 ENCOUNTER — Other Ambulatory Visit: Payer: Self-pay | Admitting: Gastroenterology

## 2015-12-23 ENCOUNTER — Encounter: Payer: Self-pay | Admitting: Emergency Medicine

## 2015-12-23 ENCOUNTER — Emergency Department
Admission: EM | Admit: 2015-12-23 | Discharge: 2015-12-23 | Disposition: A | Discharge status: Home / Self Care | Payer: Medicare (Managed Care) | Source: Ambulatory Visit | Attending: Emergency Medicine | Admitting: Emergency Medicine

## 2015-12-23 DIAGNOSIS — R0609 Other forms of dyspnea: Secondary | ICD-10-CM | POA: Insufficient documentation

## 2015-12-23 DIAGNOSIS — Y92098 Other place in other non-institutional residence as the place of occurrence of the external cause: Secondary | ICD-10-CM | POA: Insufficient documentation

## 2015-12-23 DIAGNOSIS — Z951 Presence of aortocoronary bypass graft: Secondary | ICD-10-CM | POA: Insufficient documentation

## 2015-12-23 DIAGNOSIS — S0231XD Fracture of orbital floor, right side, subsequent encounter for fracture with routine healing: Secondary | ICD-10-CM | POA: Insufficient documentation

## 2015-12-23 DIAGNOSIS — W010XXD Fall on same level from slipping, tripping and stumbling without subsequent striking against object, subsequent encounter: Secondary | ICD-10-CM | POA: Insufficient documentation

## 2015-12-23 DIAGNOSIS — Y9389 Activity, other specified: Secondary | ICD-10-CM | POA: Insufficient documentation

## 2015-12-23 DIAGNOSIS — S0230XA Fracture of orbital floor, unspecified side, initial encounter for closed fracture: Secondary | ICD-10-CM

## 2015-12-23 DIAGNOSIS — Y998 Other external cause status: Secondary | ICD-10-CM | POA: Insufficient documentation

## 2015-12-23 DIAGNOSIS — Z7901 Long term (current) use of anticoagulants: Secondary | ICD-10-CM | POA: Insufficient documentation

## 2015-12-23 LAB — COMPREHENSIVE METABOLIC PANEL
ALT: 16 U/L (ref 0–35)
Albumin: 3.7 g/dL (ref 3.5–5.2)
Alk Phos: 126 U/L — ABNORMAL HIGH (ref 35–105)
Anion Gap: 12 (ref 7–16)
Bilirubin,Total: 0.3 mg/dL (ref 0.0–1.2)
CO2: 25 mmol/L (ref 20–28)
Calcium: 9.1 mg/dL (ref 8.6–10.2)
Chloride: 104 mmol/L (ref 96–108)
Creatinine: 0.71 mg/dL (ref 0.51–0.95)
GFR,Black: 100 *
GFR,Caucasian: 87 *
Globulin: 2.6 g/dL — ABNORMAL LOW (ref 2.7–4.3)
Glucose: 91 mg/dL (ref 60–99)
Lab: 14 mg/dL (ref 6–20)
Sodium: 141 mmol/L (ref 133–145)
Total Protein: 6.3 g/dL (ref 6.3–7.7)

## 2015-12-23 LAB — CBC AND DIFFERENTIAL
Baso # K/uL: 0.1 10*3/uL (ref 0.0–0.1)
Basophil %: 0.9 %
Eos # K/uL: 0.3 10*3/uL (ref 0.0–0.4)
Eosinophil %: 2.8 %
Hematocrit: 40 % (ref 34–45)
Hemoglobin: 13 g/dL (ref 11.2–15.7)
IMM Granulocytes #: 0 10*3/uL (ref 0.0–0.1)
IMM Granulocytes: 0.4 %
Lymph # K/uL: 2.5 10*3/uL (ref 1.2–3.7)
Lymphocyte %: 24.1 %
MCH: 29 pg (ref 26–32)
MCHC: 32 g/dL (ref 32–36)
MCV: 90 fL (ref 79–95)
Mono # K/uL: 0.8 10*3/uL (ref 0.2–0.9)
Monocyte %: 7.5 %
Neut # K/uL: 6.7 10*3/uL — ABNORMAL HIGH (ref 1.6–6.1)
Nucl RBC # K/uL: 0 10*3/uL (ref 0.0–0.0)
Nucl RBC %: 0 /100 WBC (ref 0.0–0.2)
Platelets: 264 10*3/uL (ref 160–370)
RBC: 4.5 MIL/uL (ref 3.9–5.2)
RDW: 13.7 % (ref 11.7–14.4)
Seg Neut %: 64.3 %
WBC: 10.4 10*3/uL — ABNORMAL HIGH (ref 4.0–10.0)

## 2015-12-23 LAB — APTT: aPTT: 30.5 s (ref 25.8–37.9)

## 2015-12-23 LAB — PROTIME-INR
INR: 1.1 (ref 0.9–1.1)
Protime: 12.6 s (ref 10.0–12.9)

## 2015-12-23 LAB — POTASSIUM: Potassium: 4.2 mmol/L (ref 3.3–5.1)

## 2015-12-23 MED ORDER — ACETAMINOPHEN 325 MG PO TABS *I*
975.0000 mg | ORAL_TABLET | Freq: Once | ORAL | Status: AC
Start: 2015-12-23 — End: 2015-12-23
  Administered 2015-12-23: 975 mg via ORAL
  Filled 2015-12-23: qty 3

## 2015-12-23 NOTE — ED Provider Notes (Signed)
History     Chief Complaint   Patient presents with    Facial Swelling     Patient fell 2 days ago had a CT a day ago and she has 3 fractures to the right side of her face one of the fractures is going into the muscle causing facial numbness.. The right eye is swollen, bruised, and draining. Its painful to blow her nose. She has an appt to see the facial  surgeon on Friday.      HPI Comments: Fall Thursday night Saw MD and had CT for fracture . Has appointment with maxillofacial for Friday. Increased numbness around mouth Her headache is much worse despite negative CT brain. She has neck pain and cervical spine was never imaged.    Her fall occurred when she was watching a meteor shower in a darkened room, turned and tripped over a footstool landing on the edge of a tempered glass coffee table. Her right eyeglass was badly scratched.      History provided by:  Patient  Language interpreter used: No    Is this ED visit related to civilian activity for income:  Not work related    Past Medical History:   Diagnosis Date    Arthritis     Asthma     CAD (coronary artery disease)     DVT (deep venous thrombosis)     post-op after CABG, on anticoagulation for ~2 years    PE (pulmonary embolism)     post-op after CABG, on anticoagulation for ~2 years    Prediabetes     Tubular adenoma 03/2015    colonoscopy         Past Surgical History:   Procedure Laterality Date    APPENDECTOMY      CHOLECYSTECTOMY      CORONARY ANGIOPLASTY WITH STENT PLACEMENT      CORONARY ARTERY BYPASS GRAFT  2013    triple     CYST REMOVAL      both thumbs    TONSILLECTOMY AND ADENOIDECTOMY       Family History   Problem Relation Age of Onset    Other Mother      "rare lung disease"    Aneurysm Mother      abdominal aortic aneurysm    Diabetes Type 2 Mother     Breast cancer Mother     Stroke Father     Stroke Brother      x2       Social History    reports that she has never smoked. She has never used smokeless tobacco. She  reports that she does not drink alcohol or use illicit drugs. Her sexual activity history is not on file.    Living Situation     Questions Responses    Patient lives with Spouse    Homeless No    Caregiver for other family member No    External Services None    Employment Retired    Domestic Violence Risk           Problem List     Patient Active Problem List   Diagnosis Code    Dyspnea on exertion R06.09    Asthma J45.909    Severe Obstructive sleep apnea G47.33    Pulmonary nodule R91.1    Allergic rhinitis J30.9    CAD (coronary artery disease) I25.10    Prediabetes R73.03    Gastritis K29.70    Obesity E66.9    Acute otitis  media with perforated tympanic membrane H66.90, H72.90    Dysthymia F34.1       Review of Systems   Review of Systems   Constitutional: Positive for activity change, appetite change and fatigue.   Eyes: Negative.    Cardiovascular: Negative.    Gastrointestinal: Negative.    Endocrine: Negative.    Genitourinary: Negative.    Skin: Negative.    Allergic/Immunologic: Negative.    Neurological: Positive for dizziness, weakness, light-headedness, numbness and headaches.   Hematological: Negative.    Psychiatric/Behavioral: Negative.        Physical Exam     ED Triage Vitals   BP Pulse Heart Rate (via Pulse Ox) Resp Temp Temp src SpO2 O2 Device O2 Flow Rate   12/23/15 1559 -- 12/23/15 1559 12/23/15 1559 12/23/15 1559 12/23/15 1559 12/23/15 1559 12/23/15 1559 --   181/88  74 16 36.3 C (97.3 F) TEMPORAL 96 % None (Room air)       Weight           12/23/15 1559           120.2 kg (265 lb)                    Physical Exam   Constitutional: She appears well-developed and well-nourished. No distress.   HENT:   Head: Normocephalic.   Right Ear: Tympanic membrane, external ear and ear canal normal.   Left Ear: Tympanic membrane, external ear and ear canal normal.   Nose: Nose normal.   Mouth/Throat: Oropharynx is clear and moist. No oropharyngeal exudate.   Right black eye EOM OK Decreased  sensation right cheek and right upper lip   Eyes: Conjunctivae and EOM are normal. Pupils are equal, round, and reactive to light. Right eye exhibits no discharge. Left eye exhibits no discharge. No scleral icterus.   EOM appears intact She can look up and out but it hurts in the right eye when she does. She does not have diplopia in any field of gaze.   Neck: Normal range of motion. Neck supple. No JVD present. No tracheal deviation present. No thyromegaly present.   Pain with spontaneous neck motion   Cardiovascular: Normal rate, regular rhythm, normal heart sounds and intact distal pulses.    Pulmonary/Chest: Effort normal and breath sounds normal. No stridor.   Abdominal: Soft. She exhibits no distension and no mass. There is no tenderness. There is no rebound and no guarding.   Musculoskeletal: Normal range of motion. She exhibits no edema, tenderness or deformity.   Lymphadenopathy:     She has no cervical adenopathy.   Neurological: She is alert. No cranial nerve deficit.   Skin: Skin is warm and dry. No rash noted. She is not diaphoretic. No erythema. No pallor.   Psychiatric: She has a normal mood and affect. Her behavior is normal. Judgment and thought content normal.   Nursing note and vitals reviewed.      Medical Decision Making        Initial Evaluation:  ED First Provider Contact     Date/Time Event User Comments    12/23/15 1755 ED Provider First Contact [facility], Akeia Perot R Initial Face to Face Provider Contact          Patient seen by me as above    Assessment:  70 y.o.female comes to the ED with neck pin worsening numbness face and worsening headache    Differential Diagnosis includes delayed v bleed v neck fracture v complications  of blow out fracture                      Plan: labs imaging EKG sinus  Axcel Horsch R Terris Bodin, MD    CT brain  No delayed bleed. CT cervical spine - no fracture    Discussed with Angelina Ok No entrapment although it hurts the right eye when she looks up and upwards and outward.      This is not entrapment according to Dr Silvano Rusk.    OP management with an earlier appointment if possible.       Glennon Mac, MD  12/23/15 2128

## 2015-12-23 NOTE — ED Triage Notes (Signed)
Triage Note   Artesha Wemhoff, RN

## 2015-12-23 NOTE — Discharge Instructions (Signed)
No hot or cold packs    Elevate head of bed ar night    No nose blowing    If you sneeze do so through an open mouth     You may decide if you will use the steroids    Use CPAP gently and do not cause pain with its use    Call [phone] by noon on Monday to schedule appointment earlier than Friday. The clinic may call you since the [facility] resident contacted tonight will e-mail the scheduler for an earlier appointment    Return as needed

## 2015-12-23 NOTE — ED Notes (Signed)
Pt discharged per order. Pt able to verbalize understanding of written discharge instructions. Pt ambulated with steady gait, left accompanied by family.

## 2015-12-24 LAB — HOLD BLUE

## 2015-12-24 LAB — HOLD GREEN WITH GEL

## 2015-12-25 NOTE — Addendum Note (Signed)
Addended by: Murrell Converse on: 12/25/2015 04:02 PM     Modules accepted: Orders

## 2015-12-25 NOTE — Addendum Note (Signed)
Addended by: Marily Lente on: 12/25/2015 03:48 PM     Modules accepted: Orders

## 2015-12-25 NOTE — Telephone Encounter (Signed)
Dr Lianne Moris like pt did go to Ed this past weekend ) I pended referral if ok please sign and send to front desk staff to make sure pt knows where to go for appt .

## 2015-12-26 LAB — EKG 12-LEAD: Rate: 64 {beats}/min

## 2015-12-26 NOTE — Telephone Encounter (Signed)
Referral processed. Pt sched with maxillofacial surgery Friday 8/18 at 2pm.

## 2015-12-28 ENCOUNTER — Encounter: Payer: Self-pay | Admitting: Oral and Maxillofacial Surgery

## 2015-12-28 ENCOUNTER — Encounter: Payer: Self-pay | Admitting: Primary Care

## 2015-12-28 ENCOUNTER — Ambulatory Visit: Payer: Self-pay | Admitting: Oral and Maxillofacial Surgery

## 2015-12-28 VITALS — BP 117/54 | HR 64 | Ht 67.0 in | Wt 263.0 lb

## 2015-12-28 DIAGNOSIS — S0230XA Fracture of orbital floor, unspecified side, initial encounter for closed fracture: Secondary | ICD-10-CM

## 2015-12-28 DIAGNOSIS — S0292XA Unspecified fracture of facial bones, initial encounter for closed fracture: Secondary | ICD-10-CM | POA: Insufficient documentation

## 2015-12-28 DIAGNOSIS — W19XXXA Unspecified fall, initial encounter: Secondary | ICD-10-CM | POA: Insufficient documentation

## 2015-12-28 MED ORDER — SALINE NASAL SPRAY 0.65 % NA SOLN *WRAPPED*
1.0000 | NASAL | 3 refills | Status: DC | PRN
Start: 2015-12-28 — End: 2019-08-06

## 2015-12-28 MED ORDER — PSEUDOEPHEDRINE HCL 30 MG PO TABS *I*
30.0000 mg | ORAL_TABLET | Freq: Four times a day (QID) | ORAL | 0 refills | Status: AC
Start: 2015-12-28 — End: 2016-01-04

## 2015-12-28 NOTE — Preop H&P (Signed)
Oral and Maxillofacial Surgery  Consult H&P      Patient: [name]    Age:  70 y.o.     Attending: Isidore Moos       HPI: [name] is a 70 y.o. female with PMH significant for HTN, CAD (s/p CABG), DVT/PE (during CABG recovery), pre-DM. Last Thursday (08/10) she tripped over a stool at home and hit her face on the TV stand. She f/u with her PCP the following day (08/11/) where facial bone imaging was obtained. She then presented to the [facility] ED the following day (08/12) d/t increased numbness and facial pain. She was d/c to home with c-spine cleared and no evidence of intracranial hemorrhage. She is currently taking Clindamycin.    She has not been evaluated by an ophthalmologist and has an appointment scheduled with her personal ophthalmologist, Dr Oleta Mouse, in [place] next week.     Today she c/o persistent right mid-facial pain and numbness, as well as difficulty with upward gaze. She feels that her posterior right teeth and upper right lip are also numb. Her bruising of her right midface is resolving. She denies HA, vision changes, difficulty breathing, sinus congestion, changes in occlusion, neck pain, CP, SOB, n/v.    Past Medical History:   Diagnosis Date    Arthritis     Asthma     CAD (coronary artery disease)     DVT (deep venous thrombosis)     post-op after CABG, on anticoagulation for ~2 years    PE (pulmonary embolism)     post-op after CABG, on anticoagulation for ~2 years    Prediabetes     Tubular adenoma 03/2015    colonoscopy        Past Surgical History:   Procedure Laterality Date    APPENDECTOMY      CHOLECYSTECTOMY      CORONARY ANGIOPLASTY WITH STENT PLACEMENT      CORONARY ARTERY BYPASS GRAFT  2013    triple     CYST REMOVAL      both thumbs    TONSILLECTOMY AND ADENOIDECTOMY         Current Outpatient Prescriptions   Medication Sig    albuterol HFA 108 (90 BASE) MCG/ACT inhaler Inhale 2 puffs into the lungs    clindamycin (CLEOCIN) 300 MG  capsule Take 1 capsule (300 mg total) by mouth 3 times daily for 7 days    SYMBICORT 160-4.5 MCG/ACT inhaler INHALE TWO PUFFS INTO THE LUNGS TWICE DAILY ... SHAKE WELL BEFORE EACH USE.    sertraline (ZOLOFT) 50 MG tablet TAKE ONE TABLET BY MOUTH ONCE DAILY    montelukast (SINGULAIR) 10 MG tablet Take 1 tablet (10 mg total) by mouth nightly    pantoprazole (PROTONIX) 40 MG EC tablet Take 1 tablet (40 mg total) by mouth daily   Swallow whole. Do not crush, break, or chew.    metoprolol (LOPRESSOR) 25 MG tablet Take 0.5 tablets (12.5 mg total) by mouth 2 times daily    fluticasone (FLONASE) 50 MCG/ACT nasal spray INHALE 2 SPRAYS INTO EACH NOSTRIL EVERY DAY    atorvastatin (LIPITOR) 40 MG tablet Take 40 mg by mouth daily (with dinner)    azelastine (ASTELIN) 0.1 % nasal spray 1 spray by Nasal route 2 times daily    Sodium Chloride-Sodium Bicarb (AYR SALINE NASAL NETI RINSE) 1.57 G PACK 1 packet by Nasal route daily    Cholecalciferol (VITAMIN D3) 5000 UNITS TABS Take by mouth daily  ipratropium-albuterol (DUONEB) 0.5-2.5 (3) MG/3ML nebulizer solution Take 3 mLs by nebulization every 6 hours    aspirin 81 MG tablet Take 81 mg by mouth daily    loratadine (KLS ALLERCLEAR) 10 MG tablet Take 20 mg by mouth daily       docusate sodium (COLACE) 100 MG capsule Take 200 mg by mouth daily    clopidogrel (PLAVIX) 75 MG tablet Take 75 mg by mouth daily    methylPREDNISolone (MEDROL PAK) 4 MG tablet pack Take according to package directions (6 day supply)    tiotropium (SPIRIVA HANDIHALER) 18 MCG inhalation capsule Inhale 1 capsule (18 mcg total) into the lungs daily     No current facility-administered medications for this visit.        Allergies:   Allergies   Allergen Reactions    Morphine Hives    Penicillins Hives       Family History:   family history includes Aneurysm in her mother; Breast cancer in her mother; Diabetes Type 2 in her mother; Other in her mother; Stroke in her brother and  father.    Social History:   Social History   Substance Use Topics    Smoking status: Never Smoker    Smokeless tobacco: Never Used    Alcohol use No        Review of Systems:   As above, otherwise 12 point review of systems negative.    Physical Examination:  Vitals:  Heart Rate:  [64] 64  BP: (117)/(54) 117/54  Height: 170.2 cm ('5\' 7"' ) Weight: 119.3 kg (263 lb)    General appearance: alert, appears stated age, cooperative and no distress  Head: resolving right mid-facial edema, CN II-XII grossly intact bilaterally, however right CN V2 hypoesthesia (able to identify direction of touch and responds to sharp prick).  Eyes:  EOMI, PERRL, no chemosis, no subconjunctival heme, resolving righ5 periorbital edema, resolving right periorbital ecchymosis   Ears:  Hearing grossly intact, no auricular trauma  Nose:  Nares patent, septum midline, no septal deformity or hematoma  Throat/Neck: Neck full ROM  Intraoral: MIO= 45 mm, no trismus. OP clear, uvula midline, FOM s/nt/ne. No lacs, no lesions, no mobile, missing, avulsed, fractured teeth. No  tongue trauma/lesions. No soft tissue swelling.  Salivary ducts patent. No palpable/visual foreign body, maxilla/mandible/zygoma stable in 3 planes, occlusion stable/reproducible.   Lungs: nonlabored respirations, clear to auscultation bilaterally  Heart: RRR  Abdomen: soft, nondistended  Extremities: atraumatic, wwp  Neuro: alert, oriented, no focal deficits  Vascular: palpable pulses throughout      Results:    Laboratory values:   No results for input(s): WBC, HGB, HCT, PLT, INR, ESR, CRP in the last 72 hours.    No components found with this basename: APTT, PT No results for input(s): NA, K, CL, CO2, UN, CREAT, GLU, CA, MG, PO4 in the last 72 hours. No results for input(s): AST, ALT, ALK, TB, DB, TP, AMY, LIP, ALB, PALB in the last 72 hours.     Imaging:   Ct Maxillofacial Without Contrast    Result Date: 12/22/2015  IMPRESSION: Displaced fractures of the right lamina papyracea  and right inferior orbital wall. Right orbital fat herniates through the defect and a small portion of the inferior rectus muscle is seen at the opening of the fracture. Small amount of hemorrhagic fluid is seen in the right ethmoid air cells and right maxillary sinus. Mild mild right periorbital soft tissue swelling.  Findings discussed with Dr. Rich Reining by Dr.  Shaikh at the time of the examination on 12/22/15.  END REPORT    Ct Head Without Contrast    Result Date: 12/23/2015  IMPRESSION: 1. No evidence of acute intracranial hemorrhage. 2. Known facial fractures are grossly stable, better appreciated on recent maxillofacial CT. 3. Right maxillary hemosinus, similar to the prior study.   I have personally reviewed the image(s) and the resident's interpretation and agree with or edited the findings.      Ct Head Without Contrast    Result Date: 12/22/2015  IMPRESSION:  1.  No CT evidence of acute infarct, intracranial hemorrhage, midline shift, or mass effect.  2.  Fractures at the right lamina papyracea and right inferior orbital wall better visualized on CT facial bones. Mild hemorrhagic fluid in the right ethmoid air cells and right maxillary sinus.  3.  Mild white matter changes, nonspecific, but likely a sequela of chronic small vessel ischemic disease.  END REPORT    Ct Cervical Spine Without Contrast    Result Date: 12/23/2015  IMPRESSION:  Degenerative changes involving the cervical spine without evidence of acute cervical spine fracture.  I have personally reviewed the image(s) and the resident's interpretation and agree with or edited the findings.        Summary of Fractures/Lacs:  Right inferior orbital wall fx with herniation of fat and inferior rectus muscle.    Assessment:  [name] is a 70 y.o. female with h/o HTN, CAD, s/p CABG 2013, pre DM, who now presents with right orbital floor fx with herniation of inferior rectus muscle requiring repair. Discussed indications for surgical repair of  fracture with placement of membrane to re-suspend fat and muscle. Explained that pt must be evaluated by ophthalmology and to contact Dr Keturah Shavers practice.     Plan:   Recommend ophthalmologic evaluation for inferior orbital floor fx ASAP with Dr Oleta Mouse.   Sinus precautions reviewed. Prescriptions sent for ocean spray and sudafed.   Plan for operative repair likely next week.       This case was discussed with Dr. Tamera Reason (chief resident) and Dr. Isidore Moos (attending)    Thank you for the opportunity to participate in this patient's care.     Please contact the [facility] resident on call at [phone] [facility] [phone] resident with questions or concerns.    Author: Loura Halt, DMD as of: 12/28/2015  at: 4:29 PM

## 2015-12-29 ENCOUNTER — Ambulatory Visit: Payer: Self-pay | Admitting: General Practice

## 2016-01-01 ENCOUNTER — Telehealth: Payer: Self-pay | Admitting: Oral and Maxillofacial Surgery

## 2016-01-01 NOTE — Telephone Encounter (Signed)
Patient has Medicare/[employer] policy  Patient is scheduled 01/03/16 with Dr. Kennyth Arnold Excellus to check if medical code 21386 would require prior approval.  Per Representative Kimberly-non required-call reference#[id number]

## 2016-01-02 NOTE — Invasive Procedure Plan of Care (Signed)
Invasive Procedure Plan of Care (Consent Form 419):   Condition(s) Addressed: Fracture of the orbital floor   Person Performing Procedure: Jannett Celestine, and [facility] residents   Side: Right    Procedure: - open approach to the orbital floor for implant placement   Special Equipment:    Planned Anesthesia: General   Benefits: - increased support for eye  - improved blurred/double vision from traumatic presentation   Risks: Swelling, bruising, pain, discomfort, bleeding, blood clot formation, infection, sinus congestion, soft tissue scar formation, hardware/implant failure, need for additional procedures, changes in sensation/tingling of surround soft tissues, visual changes, blurred vision, loss of vision.   Alternatives: No surgical procedure   Expected Length of Stay: 1 day(s)   Pt Decision: Agrees to proceed     ----------------------------------------------------------------------------------------------------------------------------------------  Consent:  I hereby give my consent and authorize KOLOKYTHAS, ANTONIA, and [facility] residents  (The list of possible assistants, all of whom are privileged to provide surgical services at the hospital, is available)  To treat the following: Fracture of the orbital floor  Procedure includes: - open approach to the orbital floor for implant placement  1 The care provider has explained my condition to me, the benefits of having the above treatment procedure, and alternate ways of treating my condition. I understand that no guarantees have been made to me about the result of the treatment. The alternatives to this procedure include: No surgical procedure   2 The care provider has discussed with me the reasonably foreseeable risks of the treatment and that there may be undesirable results. The risks that are specifically related to this procedure include: Swelling, bruising, pain, discomfort, bleeding, blood clot formation, infection, sinus congestion, soft tissue scar  formation, hardware/implant failure, need for additional procedures, changes in sensation/tingling of surround soft tissues, visual changes, blurred vision, loss of vision.   3 I understand that during the treatment a condition may be discovered which was not known before the treatment started. Therefore, I authorize the care provider to perform any additional or different treatment which is thought necessary and available.   4 Any tissue, parts, or substances removed during the procedure may be retained or disposed of in accordance with customary scientific, educational and clinical practice.   5 Vendor information if appropriate:      6 Patient Consent for Medical or Surgical Procedure: I have carefully read and fully understand this informed consent form, and have had sufficient opportunity to discuss my condition and the above procedure(s) with the care provider and his/her associates, and all of my questions have been answered to my satisfaction.    Agrees to proceed       ____________________________________________________   _______________   Signature of Patient  Date/Time     ____________________________________________________   _______________   Signature of Parent or Legal Guardian  (if Patent is unable to sign or is a minor) Date/Time       Complete this section for all OR procedures and all other invasive internal procedures performed in any setting.  7 Consent for Receipt of Tissue(s): Not expected to be needed but may be required and given in an emergency   8 For those procedures that have the potential for significant blood loss: transfusion is not expected to be needed but may be required and given in an emergency, I consent to the transfusion of blood or blood components that may be necessary before, during or after the procedure. I have been informed that no transfusion is 100% safe, however  present testing methods make the risks of infection very small. Risks include infection from viruses,  bacteria, or parasites, including but not limited to HIV (the AIDS virus) and hepatitis, as well as fever, chills, allergy, volume overload, or death. I have discussed possible alternatives with my care provider, including no transfusion, autologous transfusion (donation of my own blood), designated/directed donor transfusion (collection of blood from donors selected by me) or blood salvage during the procedure. I understand that these alternatives may not be available due to timing or health reasons, and the above risks may still apply.   9 Patient Consent for Blood/Tissue: I have had a chance to discuss the risks, benefits and alternatives regarding transfusion/receipt of tissue (as above) with my healthcare provider. My decision(s) regarding the transfusion of blood or blood components and/or the receipt of tissue are as above. I understand this covers my perioperative/periprocedural (before, during, and after the surgery/procedure) course of treatment.    Patient sign here unless 7 and 8 do not apply.       ____________________________________________________   _______________   Signature of Patient Date/Time     ____________________________________________________   _______________   Signature of Parent or Legal Guardian  (if Patent is unable to sign or is a minor) Date/Time      I have discussed the planned procedure, including the potential for any transfusion of blood products or receipt of tissue as necessary, expected benefits, the potential complications and risks and possible alternatives and their benefits and risks with the patient or the patient's surrogate. In my opinion, the patent or the patient's surrogate understands the proposed procedure, its risks, benefits, and alternatives.    Electronically signed by Leighton Ruff Abdirahman Chittum, DDS at 12:48 PM

## 2016-01-02 NOTE — Anesthesia Preprocedure Evaluation (Addendum)
Anesthesia Pre-operative History and Physical for [name]    ______________________________________________________________________________________    Summary:    I have reviewed the RN anesthesia screening information and erecords for [facility]. The patient is scheduled for ORBIT ORIF (Right Face) on 01/04/16 with Dr. Isidore Moos.  Past medical history is significant for:  Orbital floor (blow out) closed fracture - ocean nasal spray, pseudophedrine, medrol pack, flonase, astelin nasal spray  Obesity (BMI 41.3)  Severe OSA - c-pap - setting 17 - sleep study 06/01/14 (in e-records)   Asthma - albuteral, symbicort, singulair, spiriva, duoneb - Dr. Tamala Julian - well controlled - note 06/07/15 in erecords  CAD/HTN - metoprolol - s/p CABPG 2013 - 1 stent after CABPG (LAD 12/2011) - Dr. Kerin Ransom - well controlled  PE/DVT - post op CABPG x 3 vessels 2013- plavix x 2 yrs  HLD - lipitor  Prediabetes  GERD - protonix  Allergic rhinitis - loratadine - followed by Dr. Candie Chroman, [facility] (note in e-records)    According to the anesthesia RN screening report the patient has no present complaints of chest pain or difficulty breathing.  BP well controlled on meds.    Last PCP note 11/28/15 in Care Everywhere  Evaluated by Dr. Ardell Isaacs ([facility]) on 12/28/15 after fall 12/21/15 (note in e-records) - surgery discussed - requested Opthalmology evaluation with Dr. Oleta Mouse - seen 01/01/16 (note scanned into media) - "...restriction in upgaze OD consistent with IR entrapment.Marland KitchenMarland KitchenImaging shows orbital fat and inferior rectus muscle entrapped in the fracture.Marland KitchenMarland KitchenNo damage of the globe was seen."  No additional cardiopulmonary testing or in person anesthesia evaluation necessary for the scheduled procedure.  Electrophysiology/AICD/Pacer:  Spirometry 06/07/15 with Dr. Tamala Julian - revealed a reduced FEV1/FVC ratio of 64, with a moderately reduced FEV1 of 1.42 liters (53% of predicted) and a moderately reduced FVC of 2.21 liters (62% of predicted).  Spirometry is  consistent with moderate obstructive lung disease and is stable.     Overnight oximetry was done on CPAP and room air in January 2016 and there was no need for supplemental oxygen.    By Pleas Koch, NP at 3:52 PM on 01/02/2016    <[email or web address]>  Anesthesia Evaluation Information Source: records, patient         GENERAL    + Obesity     PULMONARY    + Asthma    + Shortness of breath    + Sleep apnea          CPAP  Pertinent(-): smokingHx of Mechanical Ventilation: 17.    CARDIOVASCULAR    + CABG (2013)            no chest pain since    + CAD (2013)    + Coronary intervention            stent    + DOE    + DVT          with PE    GI/HEPATIC/RENAL  Last PO Intake: >8hr before procedure and >2hr before procedure (clears)    + GERD  Pertinent(-):  liver  issues, renal issues     ENDO/OTHER  Pertinent(-):  diabetes mellitus (Pre-diabetes), thyroid disease    HEMALOGIC    + Blood dyscrasia            hyperlipidemia    + Arthritis       Physical Exam    Airway            Mouth opening: normal  Mallampati: III            TM distance (fb): >3 FB            Neck ROM: full  Dental   Normal Exam   Cardiovascular           Rhythm: regular           Rate: normal    + Murmur  3/6 and systolic    Neurologic    Normal Exam    General Survey    Normal Exam   Pulmonary     breath sounds clear to auscultation    Mental Status   Normal Exam    Operative Site    Normal Exam     ________________________________________________________________________  Plan  ASA Score  4  Anesthetic Plan general    Induction (routine IV); General Anesthesia/Sedation Maintenance Plan (inhaled agents, IV bolus and neuromuscular blockade);  Airway Manipulation (direct laryngoscopy and video laryngoscope); Airway (cuffed ETT); Line ( use current access); Monitoring (standard ASA); Positioning (supine); PONV Plan (ondansetron and dexamethasone); Pain (per surgical team); PostOp (PACU)    Informed Consent     Risks:          Risks discussed were  commensurate with the plan listed above with the following specific points: N/V, aspiration and sore throat , damage to:(eyes, nerves, teeth), allergic Rx, unexpected serious injury, awareness    Anesthetic Consent:      Anesthetic plan (and risks as noted above) were discussed with patient    Blood products Consent:        Use of blood products discussed with: patient     Plan also discussed with team members including:  resident and attending    Attending Attestation:  As the primary attending anesthesiologist, I attest that the patient or proxy understands and accepts the risks and benefits of the anesthesia plan. I also attest that I have personally performed a pre-anesthetic examination and evaluation, and prescribed the anesthetic plan for this particular location within 48 hours prior to the anesthetic as documented. Maysel Mccolm Marilynn Rail, MD 8:21 AM

## 2016-01-04 ENCOUNTER — Encounter
Admission: RE | Disposition: A | Discharge status: Home / Self Care | Payer: Self-pay | Source: Ambulatory Visit | Attending: Oral and Maxillofacial Surgery

## 2016-01-04 ENCOUNTER — Encounter: Payer: Self-pay | Admitting: Nurse Practitioner

## 2016-01-04 ENCOUNTER — Observation Stay
Admission: RE | Admit: 2016-01-04 | Disposition: A | Discharge status: Home / Self Care | Payer: Self-pay | Source: Ambulatory Visit | Attending: Oral and Maxillofacial Surgery | Admitting: Oral and Maxillofacial Surgery

## 2016-01-04 DIAGNOSIS — S0230XA Fracture of orbital floor, unspecified side, initial encounter for closed fracture: Secondary | ICD-10-CM

## 2016-01-04 HISTORY — PX: PR OPEN TX ORBITAL FLOOR BLOWOUT FX PERIORBITAL: 21386

## 2016-01-04 HISTORY — PX: PR REPAIR EYE BLOWOUT,PERIORBITAL: 21386

## 2016-01-04 SURGERY — ORIF, FRACTURE, ORBIT
Anesthesia: General | Site: Face | Laterality: Right | Wound class: Clean Contaminated

## 2016-01-04 MED ORDER — SODIUM CHLORIDE 0.9 % INJ (FLUSH) WRAPPED *I*
3.0000 mL | Freq: Three times a day (TID) | Status: DC
Start: 2016-01-04 — End: 2016-01-05
  Administered 2016-01-04 – 2016-01-05 (×2): 3 mL

## 2016-01-04 MED ORDER — CHOLECALCIFEROL 1000 UNIT PO CAPS *WRAPPED*
5000.0000 [IU] | ORAL_CAPSULE | Freq: Every morning | ORAL | Status: AC
Start: 2016-01-04 — End: 2016-01-05
  Administered 2016-01-04 – 2016-01-05 (×2): 5000 [IU] via ORAL
  Filled 2016-01-04 (×2): qty 5

## 2016-01-04 MED ORDER — FLUTICASONE PROPIONATE 50 MCG/ACT NA SUSP *I*
2.0000 | Freq: Every day | NASAL | Status: DC
Start: 2016-01-04 — End: 2016-01-05
  Administered 2016-01-04 – 2016-01-05 (×2): 2 via NASAL
  Filled 2016-01-04: qty 16

## 2016-01-04 MED ORDER — METOPROLOL TARTRATE 25 MG PO TABS *I*
ORAL_TABLET | ORAL | Status: AC
Start: 2016-01-04 — End: 2016-01-05
  Filled 2016-01-04: qty 1

## 2016-01-04 MED ORDER — LIDOCAINE HCL 1 % IJ SOLN *I*
0.1000 mL | INTRAMUSCULAR | Status: DC | PRN
Start: 2016-01-04 — End: 2016-01-04
  Administered 2016-01-04: 0.1 mL via SUBCUTANEOUS

## 2016-01-04 MED ORDER — PROPOFOL 10 MG/ML IV EMUL (INTERMITTENT DOSING) WRAPPED *I*
INTRAVENOUS | Status: AC
Start: 2016-01-04 — End: 2016-01-04
  Filled 2016-01-04: qty 20

## 2016-01-04 MED ORDER — CLINDAMYCIN PHOSPHATE IN D5W 900 MG/50ML IV SOLN *I*
900.0000 mg | INTRAVENOUS | Status: AC
Start: 2016-01-04 — End: 2016-01-04
  Administered 2016-01-04: 900 mg via INTRAVENOUS
  Filled 2016-01-04: qty 50

## 2016-01-04 MED ORDER — SALINE NASAL SPRAY 0.65 % NA SOLN *WRAPPED*
1.0000 | NASAL | Status: DC | PRN
Start: 2016-01-04 — End: 2016-01-05
  Administered 2016-01-04: 1 via NASAL
  Filled 2016-01-04: qty 44

## 2016-01-04 MED ORDER — CLINDAMYCIN HCL 300 MG PO CAPS *I*
300.0000 mg | ORAL_CAPSULE | Freq: Four times a day (QID) | ORAL | 0 refills | Status: AC
Start: 2016-01-04 — End: 2016-01-11

## 2016-01-04 MED ORDER — CLINDAMYCIN PHOSPHATE IN D5W 600 MG/50ML IV SOLN *I*
600.0000 mg | Freq: Three times a day (TID) | INTRAVENOUS | Status: DC
Start: 2016-01-04 — End: 2016-01-05
  Administered 2016-01-04 – 2016-01-05 (×2): 600 mg via INTRAVENOUS

## 2016-01-04 MED ORDER — BUDESONIDE-FORMOTEROL FUMARATE 160-4.5 MCG/ACT IN AERO *I*
2.0000 | INHALATION_SPRAY | Freq: Two times a day (BID) | RESPIRATORY_TRACT | Status: DC
Start: 2016-01-04 — End: 2016-01-05
  Administered 2016-01-04 – 2016-01-05 (×2): 2 via RESPIRATORY_TRACT
  Filled 2016-01-04: qty 6

## 2016-01-04 MED ORDER — IBUPROFEN 600 MG PO TABS *I*
ORAL_TABLET | ORAL | Status: AC
Start: 2016-01-04 — End: 2016-01-04
  Administered 2016-01-04: 600 mg via ORAL
  Filled 2016-01-04: qty 1

## 2016-01-04 MED ORDER — CLINDAMYCIN PHOSPHATE IN D5W 900 MG/50ML IV SOLN *I*
INTRAVENOUS | Status: DC
Start: 2016-01-04 — End: 2016-01-04
  Filled 2016-01-04: qty 50

## 2016-01-04 MED ORDER — SUCCINYLCHOLINE CHLORIDE 20 MG/ML IV/IJ SOLN *WRAPPED*
Status: AC
Start: 2016-01-04 — End: 2016-01-04
  Filled 2016-01-04: qty 10

## 2016-01-04 MED ORDER — ATORVASTATIN CALCIUM 40 MG PO TABS *I*
40.0000 mg | ORAL_TABLET | Freq: Every day | ORAL | Status: DC
Start: 2016-01-04 — End: 2016-01-05
  Administered 2016-01-04: 40 mg via ORAL
  Filled 2016-01-04 (×2): qty 1

## 2016-01-04 MED ORDER — FENTANYL CITRATE 50 MCG/ML IJ SOLN *WRAPPED*
INTRAMUSCULAR | Status: AC
Start: 2016-01-04 — End: 2016-01-04
  Filled 2016-01-04: qty 2

## 2016-01-04 MED ORDER — METOPROLOL TARTRATE 12.5 MG PO CAPS *I*
12.5000 mg | ORAL_CAPSULE | Freq: Two times a day (BID) | ORAL | Status: DC
Start: 2016-01-04 — End: 2016-01-05
  Administered 2016-01-04: 12.5 mg via ORAL

## 2016-01-04 MED ORDER — IPRATROPIUM-ALBUTEROL 0.5-2.5 MG/3ML IN SOLN *I*
3.0000 mL | Freq: Four times a day (QID) | RESPIRATORY_TRACT | Status: DC
Start: 2016-01-04 — End: 2016-01-05

## 2016-01-04 MED ORDER — PSEUDOEPHEDRINE HCL 30 MG PO TABS *I*
30.0000 mg | ORAL_TABLET | Freq: Four times a day (QID) | ORAL | Status: DC
Start: 2016-01-04 — End: 2016-01-05
  Administered 2016-01-04 – 2016-01-05 (×4): 30 mg via ORAL
  Filled 2016-01-04 (×8): qty 1

## 2016-01-04 MED ORDER — LIDOCAINE HCL 2 % IJ SOLN *I*
INTRAMUSCULAR | Status: DC | PRN
Start: 2016-01-04 — End: 2016-01-04
  Administered 2016-01-04: 80 mg via INTRAVENOUS

## 2016-01-04 MED ORDER — ASPIRIN 81 MG PO CHEW *I*
81.0000 mg | CHEWABLE_TABLET | Freq: Every day | ORAL | Status: AC
Start: 2016-01-04 — End: 2016-01-05
  Administered 2016-01-04 – 2016-01-05 (×2): 81 mg via ORAL
  Filled 2016-01-04 (×2): qty 1

## 2016-01-04 MED ORDER — HYDROCODONE-ACETAMINOPHEN 5-325 MG PO TABS *I*
2.0000 | ORAL_TABLET | Freq: Four times a day (QID) | ORAL | Status: DC | PRN
Start: 2016-01-04 — End: 2016-01-05

## 2016-01-04 MED ORDER — TIOTROPIUM BROMIDE MONOHYDRATE 18 MCG IN CAPS *I*
18.0000 ug | ORAL_CAPSULE | Freq: Every day | RESPIRATORY_TRACT | Status: DC
Start: 2016-01-04 — End: 2016-01-05
  Administered 2016-01-04 – 2016-01-05 (×2): 18 ug via RESPIRATORY_TRACT
  Filled 2016-01-04: qty 5

## 2016-01-04 MED ORDER — IPRATROPIUM-ALBUTEROL 0.5-2.5 MG/3ML IN SOLN *I*
3.0000 mL | Freq: Once | RESPIRATORY_TRACT | Status: DC
Start: 2016-01-04 — End: 2016-01-04

## 2016-01-04 MED ORDER — IBUPROFEN 600 MG PO TABS *I*
ORAL_TABLET | ORAL | Status: DC
Start: 2016-01-04 — End: 2016-01-05
  Filled 2016-01-04: qty 1

## 2016-01-04 MED ORDER — SERTRALINE HCL 50 MG PO TABS *I*
50.0000 mg | ORAL_TABLET | Freq: Every day | ORAL | Status: DC
Start: 2016-01-04 — End: 2016-01-05
  Administered 2016-01-04 – 2016-01-05 (×2): 50 mg via ORAL
  Filled 2016-01-04 (×3): qty 1

## 2016-01-04 MED ORDER — ARTIFICIAL TEARS OP OINT *WRAPPED* *I*
TOPICAL_OINTMENT | OPHTHALMIC | Status: DC | PRN
Start: 2016-01-04 — End: 2016-01-04
  Administered 2016-01-04: 0.5 [in_us] via OPHTHALMIC

## 2016-01-04 MED ORDER — PHENYLEPHRINE 100 MCG/ML IN NS 10 ML *WRAPPED*
INTRAMUSCULAR | Status: DC | PRN
Start: 2016-01-04 — End: 2016-01-04
  Administered 2016-01-04: 100 ug via INTRAVENOUS
  Administered 2016-01-04: 200 ug via INTRAVENOUS

## 2016-01-04 MED ORDER — DOCUSATE SODIUM 100 MG PO CAPS *I*
200.0000 mg | ORAL_CAPSULE | Freq: Every morning | ORAL | Status: AC
Start: 2016-01-04 — End: 2016-01-05
  Administered 2016-01-04: 200 mg via ORAL

## 2016-01-04 MED ORDER — IBUPROFEN 600 MG PO TABS *I*
600.0000 mg | ORAL_TABLET | Freq: Four times a day (QID) | ORAL | Status: DC
Start: 2016-01-04 — End: 2016-01-05
  Administered 2016-01-04: 600 mg via ORAL

## 2016-01-04 MED ORDER — LIDOCAINE HCL 2 % (PF) IJ SOLN *I*
INTRAMUSCULAR | Status: AC
Start: 2016-01-04 — End: 2016-01-04
  Filled 2016-01-04: qty 5

## 2016-01-04 MED ORDER — CLINDAMYCIN PHOSPHATE IN D5W 600 MG/50ML IV SOLN *I*
INTRAVENOUS | Status: AC
Start: 2016-01-04 — End: 2016-01-04
  Filled 2016-01-04: qty 50

## 2016-01-04 MED ORDER — PROPOFOL 10 MG/ML IV EMUL (INTERMITTENT DOSING) WRAPPED *I*
INTRAVENOUS | Status: DC | PRN
Start: 2016-01-04 — End: 2016-01-04
  Administered 2016-01-04: 170 mg via INTRAVENOUS

## 2016-01-04 MED ORDER — MIDAZOLAM HCL 1 MG/ML IJ SOLN *I* WRAPPED
INTRAMUSCULAR | Status: DC | PRN
Start: 2016-01-04 — End: 2016-01-04
  Administered 2016-01-04: 2 mg via INTRAVENOUS

## 2016-01-04 MED ORDER — LORATADINE 10 MG PO TABS *I*
20.0000 mg | ORAL_TABLET | Freq: Every morning | ORAL | Status: DC
Start: 2016-01-04 — End: 2016-01-05
  Administered 2016-01-04 – 2016-01-05 (×2): 20 mg via ORAL
  Filled 2016-01-04 (×3): qty 2

## 2016-01-04 MED ORDER — ONDANSETRON HCL 2 MG/ML IV SOLN *I*
1.0000 mg | Freq: Once | INTRAMUSCULAR | Status: DC | PRN
Start: 2016-01-04 — End: 2016-01-04

## 2016-01-04 MED ORDER — OXYCODONE-ACETAMINOPHEN 5-325 MG PO TABS *I*
1.0000 | ORAL_TABLET | Freq: Four times a day (QID) | ORAL | Status: DC | PRN
Start: 2016-01-04 — End: 2016-01-04

## 2016-01-04 MED ORDER — LACTATED RINGERS IV SOLN *I*
160.0000 mL/h | INTRAVENOUS | Status: DC
Start: 2016-01-04 — End: 2016-01-04
  Administered 2016-01-04: 160 mL/h via INTRAVENOUS

## 2016-01-04 MED ORDER — PANTOPRAZOLE SODIUM 40 MG PO TBEC *I*
40.0000 mg | DELAYED_RELEASE_TABLET | Freq: Every day | ORAL | Status: DC
Start: 2016-01-04 — End: 2016-01-05

## 2016-01-04 MED ORDER — LIDOCAINE-EPINEPHRINE 2 %-1:100000 IJ SOLN *I*
INTRAMUSCULAR | Status: DC | PRN
Start: 2016-01-04 — End: 2016-01-04
  Administered 2016-01-04: 4 mL via SUBCUTANEOUS

## 2016-01-04 MED ORDER — MONTELUKAST SODIUM 10 MG PO TABS *I*
10.0000 mg | ORAL_TABLET | Freq: Every evening | ORAL | Status: DC
Start: 2016-01-04 — End: 2016-01-05
  Administered 2016-01-04: 10 mg via ORAL
  Filled 2016-01-04 (×2): qty 1

## 2016-01-04 MED ORDER — EPHEDRINE 5MG/ML IN NS IV/IJ *WRAPPED*
INTRAMUSCULAR | Status: DC | PRN
Start: 2016-01-04 — End: 2016-01-04
  Administered 2016-01-04 (×3): 5 mg via INTRAVENOUS

## 2016-01-04 MED ORDER — HYDROCODONE-ACETAMINOPHEN 5-325 MG PO TABS *I*
1.0000 | ORAL_TABLET | Freq: Four times a day (QID) | ORAL | 0 refills | Status: DC | PRN
Start: 2016-01-04 — End: 2016-03-21

## 2016-01-04 MED ORDER — ONDANSETRON HCL 2 MG/ML IV SOLN *I*
INTRAMUSCULAR | Status: AC
Start: 2016-01-04 — End: 2016-01-04
  Filled 2016-01-04: qty 2

## 2016-01-04 MED ORDER — ONDANSETRON HCL 2 MG/ML IV SOLN *I*
4.0000 mg | Freq: Four times a day (QID) | INTRAMUSCULAR | Status: DC | PRN
Start: 2016-01-04 — End: 2016-01-04

## 2016-01-04 MED ORDER — HYDROMORPHONE HCL 2 MG/ML IJ SOLN *WRAPPED*
0.4000 mg | INTRAMUSCULAR | Status: DC | PRN
Start: 2016-01-04 — End: 2016-01-04

## 2016-01-04 MED ORDER — ONDANSETRON HCL 2 MG/ML IV SOLN *I*
4.0000 mg | Freq: Three times a day (TID) | INTRAMUSCULAR | Status: DC | PRN
Start: 2016-01-04 — End: 2016-01-05

## 2016-01-04 MED ORDER — ONDANSETRON HCL 2 MG/ML IV SOLN *I*
INTRAMUSCULAR | Status: DC | PRN
Start: 2016-01-04 — End: 2016-01-04
  Administered 2016-01-04: 4 mg via INTRAMUSCULAR

## 2016-01-04 MED ORDER — DEXAMETHASONE SODIUM PHOSPHATE 4 MG/ML INJ SOLN *WRAPPED*
INTRAMUSCULAR | Status: DC | PRN
Start: 2016-01-04 — End: 2016-01-04
  Administered 2016-01-04 (×2): 4 mg via INTRAVENOUS

## 2016-01-04 MED ORDER — SUCCINYLCHOLINE CHLORIDE 20 MG/ML IV/IJ SOLN *WRAPPED*
Status: DC | PRN
Start: 2016-01-04 — End: 2016-01-04
  Administered 2016-01-04: 120 mg via INTRAVENOUS

## 2016-01-04 MED ORDER — HYDROMORPHONE HCL 2 MG/ML IJ SOLN *WRAPPED*
INTRAMUSCULAR | Status: AC
Start: 2016-01-04 — End: 2016-01-04
  Administered 2016-01-04: 0.4 mg via INTRAVENOUS
  Filled 2016-01-04: qty 1

## 2016-01-04 MED ORDER — DOCUSATE SODIUM 100 MG PO CAPS *I*
ORAL_CAPSULE | ORAL | Status: AC
Start: 2016-01-04 — End: 2016-01-05
  Filled 2016-01-04: qty 1

## 2016-01-04 MED ORDER — FENTANYL CITRATE 50 MCG/ML IJ SOLN *WRAPPED*
INTRAMUSCULAR | Status: DC | PRN
Start: 2016-01-04 — End: 2016-01-04
  Administered 2016-01-04: 09:00:00 100 ug via INTRAVENOUS

## 2016-01-04 MED ORDER — LACTATED RINGERS IV SOLN *I*
125.0000 mL/h | INTRAVENOUS | Status: DC
Start: 2016-01-04 — End: 2016-01-05

## 2016-01-04 MED ORDER — DEXAMETHASONE SODIUM PHOSPHATE 4 MG/ML INJ SOLN *WRAPPED*
INTRAMUSCULAR | Status: AC
Start: 2016-01-04 — End: 2016-01-04
  Filled 2016-01-04: qty 1

## 2016-01-04 MED ORDER — MIDAZOLAM HCL 1 MG/ML IJ SOLN *I* WRAPPED
INTRAMUSCULAR | Status: AC
Start: 2016-01-04 — End: 2016-01-04
  Filled 2016-01-04: qty 2

## 2016-01-04 MED ORDER — ALBUTEROL SULFATE HFA 108 (90 BASE) MCG/ACT IN AERS *I*
2.0000 | INHALATION_SPRAY | Freq: Four times a day (QID) | RESPIRATORY_TRACT | Status: DC | PRN
Start: 2016-01-04 — End: 2016-01-05
  Filled 2016-01-04: qty 8

## 2016-01-04 SURGICAL SUPPLY — 29 items
CORD BIPOLAR DISP (Supply)
DRAPE STERI INST.POUCH (Drape) ×2 IMPLANT
DRAPE SURG INCISE MED 51 X 51IN STER (Drape) ×2 IMPLANT
ELECTRODE BLADE MODIFIED 2.5IN (Supply) ×2 IMPLANT
GLOVE BIOGEL PI MICRO SZ 7 (Glove) ×6 IMPLANT
GLOVE BIOGEL PI ORTHOPRO SZ 7.5 LF (Glove) ×8 IMPLANT
GLOVE SURG BIOGEL PI ULTRATOUCH SZ 7.0 (Glove) ×2 IMPLANT
GOWN SIRIUS RAGLAN NONREINFORCED XL (Gown) ×2 IMPLANT
NEEDLE BOVIE COLORADO (Needle) ×2 IMPLANT
OMFS CUST EAR NOSE AND THRT SUR TY (Pack) ×1 IMPLANT
PACK CUSTOM OMFS (Pack) ×1
PACKS SURGICAL TOWEL (Supply) ×2 IMPLANT
PAD PREP ALCOHOL 2PLY MED (Supply) ×6 IMPLANT
PATTIES SURG 1 X 3IN (Sponge) ×2 IMPLANT
PATTIES SURG 1/2 X 1.5IN (Sponge) IMPLANT
PEN SURG MARKER WITH RULER (Supply) IMPLANT
SET CORD BIPOLAR DISPOSABLE MALIS (Supply) IMPLANT
SHEET MEDPOR ULTRA THIN 38 X 50 X 0.85MM (Implant) ×2 IMPLANT
SHIELD CORNEAL LT 8MM (Supply) ×2 IMPLANT
SPEAR EYE OPTHO PINK (Sponge) ×2 IMPLANT
SPIKE MINI DISPENSING PIN (BBRAUN DP1000) (Supply) ×2 IMPLANT
STAPLER SKIN 35W NL (Supply) ×1
STAPLER SKIN WIDE STPL LEG L3.9MM WIRE DIA0.58MM FIX HD PROX (Supply) ×1 IMPLANT
SUTR PLAIN GUT 6-0 PC-1 18 IN YELLOW (Suture) ×2 IMPLANT
SUTR SILK 2-0 X-1 18 IN BLACK (Suture) ×2 IMPLANT
SYRINGE IRRIG BULB 50CC (Supply) ×4 IMPLANT
SYRINGE LUERLOCK 10ML INDIVIDUAL WRAP (Supply) ×2 IMPLANT
SYRINGE LUERLOCK 30ML INDIVIDUAL WRAP (Supply) ×2 IMPLANT
TAPE DURAPORE 1IN SILK (Dressing) ×2 IMPLANT

## 2016-01-04 NOTE — Progress Notes (Signed)
Arrived to 23 hr unit room 7 from PACU after report received from the PACU RN.  Ambulated to her room with 2 assist.  S/P ORIF of right orbit- periorbital edema, ecchymosis and scant serous drainage.  HOB elevated.  Tol sips of water, denies pain.  Oriented to the unit.  Family visiting, supportive.  Up amb to bathroom with contact guard, has voided twice good amts.  See flows for full assessment.

## 2016-01-04 NOTE — OR Nursing (Signed)
Corneal protractor removed at the end of the case

## 2016-01-04 NOTE — Progress Notes (Signed)
1212- Patient is alert and oriented.  Medicated x1 for right eye pain . Denies nausea or pain at this time. Scant pink drainage from right eye.. Iced eye pads used on right eyelids. Family has been updated. Report given to [facility] RN in  [place] 23. 97 % oxygen saturated on room air. Patient is on a bed/hover mat.

## 2016-01-04 NOTE — Op Note (Addendum)
PATIENTRAMANDA, [name] MR #:  [id number]   ACCOUNT #:  [id number] DOB:  04/04/1946    AGE:  70     SURGEON:  Jannett Celestine, DDS  CO-SURGEON:    ASSISTANT:  1. Resident Randall Hiss Ziah Turvey.   2. Resident Cranston Neighbor.  SURGERY DATE:  01/04/2016    PREOPERATIVE DIAGNOSIS:  Right orbital floor blowout fracture.    POSTOPERATIVE DIAGNOSIS:  Right orbital floor blowout fracture.    OPERATIVE PROCEDURE:  Open placement of right orbital floor implant.    ANESTHESIA:  General.    INDICATIONS FOR THE PROCEDURE:  The patient was referred to the Oral Surgery Clinic at [facility] for evaluation of right orbital floor fracture, status post trauma.  CT revealed right orbital floor and medial wall blowout fractures.    DESCRIPTION OF PROCEDURE:  On the day of procedure, the patient and family were met in the preanesthesia area.  Consent was obtained for placement of orbital floor implant for reduction of her fracture.  The patient was informed of all risks, benefits, and alternatives.  The patient was brought to the operating room by the Anesthesia team where the patient was laid supine on the table.  General anesthesia was induced, and an oral endotracheal tube was placed and secured by the Anesthesia team.  The patient was prepped and draped in the normal sterile fashion.  A corneal shield was placed over the right eye with LubriFresh ointment.  3 cc of 1% lidocaine with 1:100,000 epinephrine was injected to the left lateral canthus and right infraorbital nerve, as well as along the orbital rim.  Attention was then turned to the right orbit where a [place] monopolar was used to make a transconjunctival dissection to the right orbital rim.  The rim was skeletonized, subperiosteal dissection was developed, and this was carried posteriorly to evaluate the orbital floor fracture.  Soft tissue herniation in the maxillary sinus was elevated and relieved.  Solid bone along the lateral, posterior and medial  portions of the fracture were identified.  A Medpor implant was trimmed and shaped to cover the defect.  This was placed over the defect with edges on solid bone.  Soft tissues then were draped over the implant.  The conjunctiva was closed with a running 6-0 fast gut suture.  The corneal shield was then removed.  BSS was used to irrigate the eyes.  The patient's care was then turned back over to Anesthesia team, who awoke and extubated the patient without complication.  The patient was then transferred to the PACU.  Dr. Isidore Moos was present for all critical and key portions of the procedure.       Dictated By:  Kalman Drape, DDS      ______________________________  Jannett Celestine, DDS    EMR/MODL  DD:  01/04/2016 10:52:49  DT:  01/04/2016 12:06:28  Job #:  [id number]    cc:

## 2016-01-04 NOTE — Progress Notes (Signed)
Notified Dr Ringer about sleeping O2 sat on room air= 78%.  Uses CPAP machine at home.  Dr. Tamera Reason by to see patient, would prefer pt stay on O2 tonight as needed due to the sinus/orbit surgery today.  Mosimo safety net sat monitoring on as protocol.  O2 sats on 2L = 95%.

## 2016-01-04 NOTE — Progress Notes (Signed)
Oral and Maxillofacial Surgery Post Procedure Note    PROCEDURE  ORIF right orbital floor with implant placement    SUBJECTIVE  Pt sitting up, eating mac & cheese, tolerating PO intake. Ambulating, voiding freely. Pain well-controlled. Feels vision in right eye may be slightly more blurry than baseline, but she is not wearing her glasses and isn't sure. Endorses numbness to right mid-face.     OBJECTIVE  Physical Exam:  Patient Vitals for the past 4 hrs:   BP Temp Temp src Pulse Resp SpO2   01/04/16 1822 - - - 68 - -   01/04/16 1641 119/78 36.4 C (97.5 F) TEMPORAL 59 16 96 %   01/04/16 1617 - - - - - 94 %   01/04/16 1615 - - - 77 14 (!) 78 %     GEN: no apparent distress  HEENT: Mild right midfacial edema and ecchymosis. EOMI though pain with upward gaze. PERRL. Right periorbital edema and ecchymosis. Right subconjunctival hemorrhage. Nares patent b/l, septum midline. Trachea midline. OP clear, uvula midline, FOM s/nt/ne. No oral trauma.  CHEST: nonlabored respirations  ABD: soft, nondistended, nontender  NEURO/MOTOR: alert  EXTREMITIES: atraumatic  VASCULAR: feet wwp    ASSESSMENT  [name] is a 70 y.o. female with h/o HTN, MI, OSA, pre-DM, asthma who is now s/p ORIF right orbital floor with implant placement. Pt is doing well post operatively.     PLAN   HEENT: HOB elevated, ice to face    Cardiovascular: No concerns. Continue home meds   Pulmonary: Continue 2L O2 via NC. No CPAP   GI: Antiemetics prn,   Fluids/Electrolytes: mIVF 125cc/hr. Encourage PO intake.   Diet: Diet regular   ID/Abx: Clindamycin 600mg  q8h   Analgesia: PO Ibuprofen ATC, Norco 5-325 PRN   Activity:  OOB/Ambulate, encourage   Dispo: D/c to home tomorrow    Please page OMFS resident on call at [phone] [facility] with any questions or concerns.

## 2016-01-04 NOTE — Plan of Care (Signed)
Alteration of skin integrity related to surgical procedure     Maintain skin integrity with incision line approximated Progressing towards goal        Mobility     Functional status is maintained or improved - Geriatric Progressing towards goal        Pain/Comfort     Patient's pain or discomfort is manageable Progressing towards goal        Safety     Patient will remain free of falls Progressing towards goal

## 2016-01-04 NOTE — Anesthesia Procedure Notes (Signed)
---------------------------------------------------------------------------------------------------------------------------------------    AIRWAY   GENERAL INFORMATION AND STAFF    Patient location during procedure: OR  CONDITION PRIOR TO MANIPULATION     Current Airway/Neck Condition:  Normal        For more airway physical exam details, see Anesthesia PreOp Evaluation  AIRWAY METHOD     Patient Position:  Sniffing    Preoxygenated: yes      Induction: IV      Technique Used for Successful ETT Placement:  Direct laryngoscopy    Blade Type:  Macintosh    Laryngoscope Blade/Video laryngoscope Blade Size:  3    Cormack-Lehane Classification:  Grade IIa - partial view of glottis    Placement Verified by: capnometry, auscultation and equal breath sounds      Number of Attempts at Approach:  1    Number of Other Approaches Attempted:  0  FINAL AIRWAY DETAILS    Final Airway Type:  Endotracheal airway    Final Endotracheal Airway:  ETT      Cuffed: cuffed    Insertion Site:  Oral    ETT Size (mm):  7.0    Distance inserted from Lips (cm):  22  ----------------------------------------------------------------------------------------------------------------------------------------

## 2016-01-04 NOTE — INTERIM OP NOTE (Signed)
Interim Op Note (Surgical Log ID: [id number])       Date of Surgery: 01/04/2016       Surgeons: Surgeon(s) and Role:     * Kolokythas, Berta Minor, DDS - Primary     * Ringer, Leighton Ruff, DDS - Resident - Assisting     * Arnold Long, DDS - Resident - Assisting       Pre-op Diagnosis: Pre-Op Diagnosis Codes:     * Orbital floor (blow-out) closed fracture [S02.30XA]       Post-op Diagnosis: Post-Op Diagnosis Codes:     * Orbital floor (blow-out) closed fracture [S02.30XA]       Procedure(s) Performed: Procedure:    ORBIT ORIF  CPT(R) Code:  21386 - PR REPAIR EYE BLOWOUT,PERIORBITAL         Additional CPT Codes: 21386: Pr Repair Eye Blowout,Periorbital;         Anesthesia Type: General        Fluid Totals: I/O this shift:  08/24 0700 - 08/24 1459  In: 2000 (16.8 mL/kg) [I.V.:2000]  Out: 15 (0.1 mL/kg) [Blood:15]  Net: 1985  Weight: 119 kg        Estimated Blood Loss: Blood Loss: 15 mL       Specimens to Pathology:  * No specimens in log *       Temporary Implants: None       Packing:  None               Patient Condition: good       Findings (Including unexpected complications):      Signed:  Arnold Long, DDS  on 01/04/2016 at 10:33 AM

## 2016-01-04 NOTE — Anesthesia Case Conclusion (Signed)
CASE CONCLUSION  Emergence  Actions:  Suctioned and extubated  Criteria Used for Airway Removal:  Adequate Tv & RR, acceptable O2 saturation and following commands  Assessment:  Routine  Transport  Directly to: PACU  Airway:  Nasal cannula  Oxygen Delivery:  2 lpm  Position:  Recumbent  Patient Condition on Handoff  Level of Consciousness:  Mildly sedated  Patient Condition:  Stable  Handoff Report to:  RN

## 2016-01-04 NOTE — Anesthesia Postprocedure Evaluation (Signed)
Anesthesia Post-Op Note    Patient: [name]    Procedure(s) Performed:  Procedure Summary     Date Anesthesia Start Anesthesia Stop Room / Location    01/04/16 0832 [id number] / [facility] MAIN OR       Procedure Diagnosis Surgeon Attending Anesthesia    ORBIT ORIF (Right Face) Orbital floor (blow-out) closed fracture  (right orbital fracture) Jannett Celestine, DDS Nira Retort, MD        Recovery Vitals  BP: 134/72 (01/04/2016 11:47 AM)  Heart Rate: 70 (01/04/2016 11:47 AM)  Heart Rate (via Pulse Ox): 69 (01/04/2016 11:47 AM)  Resp: 16 (01/04/2016 11:47 AM)  Temp: 36.1 C (97 F) (01/04/2016 10:45 AM)  SpO2: 95 % (01/04/2016 11:47 AM)  O2 Device: None (Room air) (01/04/2016 11:30 AM)  O2 Flow Rate: 0 L/min (01/04/2016 11:15 AM)   0-10 Scale: 0 (01/04/2016 11:30 AM)  Anesthesia type:  General  Complications Noted During Procedure or in PACU:  None   Comment:    Patient Location:  PACU  Level of Consciousness:    Recovered to baseline and awake  Patient Participation:     Able to participate  Temperature Status:    Normothermic  Oxygen Saturation:    Within patient's normal range  Cardiac Status:   Within patient's normal range  Fluid Status:    Stable  Airway Patency:     Yes  Pulmonary Status:    Baseline  Pain Management:    Adequate analgesia  Nausea and Vomiting:  None    Post Op Assessment:    Tolerated procedure well   Attending Attestation:  All indicated post anesthesia care provided     -

## 2016-01-04 NOTE — Progress Notes (Signed)
UPDATES TO PATIENT'S CONDITION on the DAY OF SURGERY/PROCEDURE    I. Updates to Patient's Condition (to be completed by a provider privileged to complete a H&P, following reassessment of the patient by the provider):    Day of Surgery/Procedure Update:  History  History reviewed and no change    Physical  Physical exam updated and no change            II. Procedure Readiness   I have reviewed the patient's H&P and updated condition. By completing and signing this form, I attest that this patient is ready for surgery/procedure.    III. Attestation   I have reviewed the updated information regarding the patient's condition and it is appropriate to proceed with the planned surgery/procedure.    [name], DDS as of 8:28 AM 01/04/2016

## 2016-01-04 NOTE — OR Nursing (Signed)
Identified patient in pre an verbally, by id band and chart with her family present. Confirmed surgical site and procedure verbally. Allergic to penicillin and morphine as per patient and chart. SCD placed and turned on prior to induction. lubrifresh placed in corneal protractor and placed over right cornea and eye lid sutured shut by Dr. Tamera Reason

## 2016-01-04 NOTE — Discharge Instructions (Signed)
Oral and Maxillofacial Surgery Orthognathic Surgery  Discharge Instructions      Date: 01/04/2016  Diagnosis: right orbital floor fracture  Attending Surgeon: Dr. Isidore Moos      After receiving sedative and/or general anesthetic medications you may be drowsy for up to 24hrs.  DO NOT drive or operate heavy machinery for 24hrs, DO NOT drink alcoholic beverages for [id number], DO NOT make major decisions, sign contracts, etc. for 24hrs.    DO NOT drive, operate heavy machinery, drink alcoholic beverages or make major decisions or sign important documents while taking narcotic pain medications.     Diet:  Normal diet    Dressing or Incision care: Ice to area for comfort    Activity: Use ice pack x 2days then switch to heat if needed for muscle soreness, rest. No strenuous activity.    Bathing/showering: May shower and bathe.    Med Rec: As prescribed    May return to work/school 3-4 days    Call your doctor if: Uncontrolled pain, Fever of 101 F. or greater, Vomiting, Nausea or Diarrhea or uncontrolled bleeding              These postoperative instructions are to be used as a guideline to help make your  postoperative course uneventful and answer questions you or your caregivers might have.  You have just had a major surgery performed and not all patients respond the same. If you have questions that these instructions do no cover, please call the office to speak to one of our surgeons. If you think you are having a complication please call immediately.    Postoperative Visits:   After discharge from the hospital a postoperative visit should be scheduled usually within two to four days for a visit to the office one week after surgery.    Activity:   Immediately after surgery you will feel lethargic. This is normal and is anticipated after any general anesthesia and major surgery where you may have significant blood loss. Your activity for the first week should be limited to normal non-strenuous activities. If you are  requiring narcotic pain medications for control of discomfort you should not be driving a car or working with any dangerous equipment. The best rule of thumb is to listen to your body: when you feel good perform activities and when you feel tired then rest.    Medication:   Upon discharge from the hospital you will usually be given prescriptions for an antibiotic, anti-inflammatory, and a narcotic pain medicine. Please use these as instructed! If you have questions about the medicines or have a reaction to one the medicines or have a reaction to one the medicines please call the office.    Swelling:   Every patient will experience some degree of swelling. Ice placed on the cheek will aid in keeping the swelling down. Ice is used for the first 48 hours and then moist heat can be used. Sometimes even after 48 hours the ice packs will feel good on your cheek and you can alternate hot and cold packs.    Remember, each person responds differently to surgery and you may have special  circumstances that may necessitate deviation from our guidelines. Your surgeon will  discuss any modifications of your postoperative course but should you have any  questions of concerns please call the office at [phone] for clarification.

## 2016-01-05 ENCOUNTER — Encounter: Payer: Self-pay | Admitting: Oral and Maxillofacial Surgery

## 2016-01-05 MED ORDER — CLINDAMYCIN PHOSPHATE IN D5W 600 MG/50ML IV SOLN *I*
INTRAVENOUS | Status: AC
Start: 2016-01-05 — End: 2016-01-05
  Filled 2016-01-05: qty 50

## 2016-01-05 MED ORDER — METOPROLOL TARTRATE 25 MG PO TABS *I*
ORAL_TABLET | ORAL | Status: AC
Start: 2016-01-05 — End: 2016-01-05
  Administered 2016-01-05: 12.5 mg via ORAL
  Filled 2016-01-05: qty 1

## 2016-01-05 MED ORDER — IBUPROFEN 600 MG PO TABS *I*
ORAL_TABLET | ORAL | Status: AC
Start: 2016-01-05 — End: 2016-01-05
  Administered 2016-01-05: 600 mg via ORAL
  Filled 2016-01-05: qty 1

## 2016-01-05 MED ORDER — DOCUSATE SODIUM 100 MG PO CAPS *I*
ORAL_CAPSULE | ORAL | Status: AC
Start: 2016-01-05 — End: 2016-01-05
  Administered 2016-01-05: 200 mg via ORAL
  Filled 2016-01-05: qty 2

## 2016-01-05 MED ORDER — PANTOPRAZOLE SODIUM 40 MG PO TBEC *I*
DELAYED_RELEASE_TABLET | ORAL | Status: AC
Start: 2016-01-05 — End: 2016-01-05
  Administered 2016-01-05: 40 mg via ORAL
  Filled 2016-01-05: qty 1

## 2016-01-05 NOTE — Progress Notes (Signed)
Patient A+O x3, denies any pain or discomfort. Right eye ecchymotic/swollen with scant serosanguinous drainage noted. Right eye vision slightly blurry, per patient no change from preop. Patient tolerating regular diet and ambulating without difficulty. Call light is within reach. Aurelio Jew, RN

## 2016-01-05 NOTE — Progress Notes (Signed)
Oral and Maxillofacial Surgery Post Procedure Note    PROCEDURE  ORIF right orbital floor with implant placement    SUBJECTIVE  Pt tolerating PO intake. Ambulating, voiding freely. Pain well-controlled. Patient reports no blurry or double vision. Endorses numbness to right mid-face.     OBJECTIVE  Physical Exam:  Patient Vitals for the past 4 hrs:   BP Temp Temp src Pulse Resp SpO2   01/05/16 0747 - - - 64 - -   01/05/16 0742 141/72 36 C (96.8 F) TEMPORAL - 16 96 %   01/05/16 0700 - - - - - 96 %   01/05/16 0418 125/62 36.2 C (97.2 F) TEMPORAL 65 16 96 %     GEN: no apparent distress  HEENT: Mild right midfacial edema and ecchymosis. EOMI. PERRL. Right periorbital edema and ecchymosis. Right subconjunctival hemorrhage.   CHEST: nonlabored respirations  NEURO/MOTOR: alert  EXTREMITIES: atraumatic  VASCULAR: feet wwp    ASSESSMENT  [name] is a 70 y.o. female with h/o HTN, MI, OSA, pre-DM, asthma who is now s/p ORIF right orbital floor with implant placement. Pt is doing well post operatively.     PLAN   HEENT: HOB elevated, ice to face. Sinus precautions on discharge. Recommend patient limit CPAP use at home for one week if possible.   Cardiovascular: No concerns. Continue home meds   Pulmonary: Continue 2L O2 via NC. No CPAP   GI: Antiemetics prn,   Fluids/Electrolytes: mIVF 125cc/hr. Encourage PO intake.   Diet: Diet regular   ID/Abx: Clindamycin 600mg  q8h   Analgesia: PO Ibuprofen ATC, Norco 5-325 PRN   Activity:  OOB/Ambulate, encourage   Dispo: D/c to home today    Please page OMFS resident on call at [phone] [facility] with any questions or concerns.

## 2016-01-05 NOTE — Progress Notes (Signed)
[facility] SOCIAL WORK  PHARMACY FORM     Todays date:  January 05, 2016    Patient Name: [name] [facility]      Medical Record #: [id number]   DOB: Apr 09, 1946  Patients Address: [address], [address]                Social Worker: Vaughan Sine, LMSW       Date of Service: January 05, 2016       Funding Source: [place]  ___________________________________________________________________    Pharmacy Information:  Date/time sent: January 05, 2016     Time needed: ASAP    Patient Location: Inpatient (specify unit)  [id number]    Medication Pick-up Preference: Patient will pick up at the pharmacy    Pharmacy Contact: [place] work signature: Vaughan Sine, LMSW  Supervisor/Manager Approval (if indicated):  Date:  (supervisor signature not required for Medicaid pending)

## 2016-01-05 NOTE — Plan of Care (Signed)
Patient states readiness for discharge.  IV discontinued.  Patient tolerating good oral intake.  Patient denies nausea.  Patient denies pain.  Patient independently out of bed with steady gait.  Patient voided.  Discharge instructions reviewed with patient and husband . Verbalized good understanding.  Dr. Isidore Moos by with instructions to not use CPAP and to use elevation and ice to keep edema down.

## 2016-01-12 ENCOUNTER — Ambulatory Visit: Payer: Self-pay | Admitting: Oral and Maxillofacial Surgery

## 2016-01-12 ENCOUNTER — Encounter: Payer: Self-pay | Admitting: Oral and Maxillofacial Surgery

## 2016-01-12 VITALS — BP 150/65 | HR 70 | Temp 95.0°F | Resp 16 | Ht 67.0 in | Wt 263.0 lb

## 2016-01-12 DIAGNOSIS — S0230XA Fracture of orbital floor, unspecified side, initial encounter for closed fracture: Secondary | ICD-10-CM

## 2016-01-12 NOTE — Progress Notes (Signed)
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, on anticoagulation for ~2 years    PE (pulmonary embolism)     post-op after CABG, on anticoagulation for ~2 years    Prediabetes     Tubular adenoma 03/2015    colonoscopy      Past Surgical Hx:   Past Surgical History:   Procedure Laterality Date    APPENDECTOMY      CHOLECYSTECTOMY      CORONARY ANGIOPLASTY WITH STENT PLACEMENT      CORONARY ARTERY BYPASS GRAFT  2013    triple     CYST REMOVAL      both thumbs    PR REPAIR EYE BLOWOUT,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    Procedure: ORBIT ORIF;  Surgeon: Jannett Celestine, DDS;  Location: [facility] MAIN OR;  Service: OMFS    TONSILLECTOMY AND ADENOIDECTOMY         Meds:   Prior to Admission medications    Medication Sig Start Date End Date Taking? Authorizing Provider   HYDROcodone-acetaminophen (NORCO) 5-325 MG per tablet Take 1 tablet by mouth every 6 hours as needed   Max daily dose: 4 tablets 01/04/16   Grasiela Jonsson, Leighton Ruff, DDS   albuterol HFA 108 (90 BASE) MCG/ACT inhaler Inhale 2 puffs into the lungs every 4-6 hours as needed       [provider]   sodium chloride (OCEAN) 0.65 % nasal spray 1 spray by Each Nare route as needed for Congestion 12/28/15   Loura Halt, DMD   SYMBICORT 160-4.5 MCG/ACT inhaler INHALE TWO PUFFS INTO THE LUNGS TWICE DAILY ... SHAKE WELL BEFORE EACH USE. 12/11/15   Verdene Lennert, MD   sertraline (ZOLOFT) 50 MG tablet TAKE ONE TABLET BY MOUTH ONCE DAILY 11/07/15   Verdene Lennert, MD   montelukast (SINGULAIR) 10 MG tablet Take 1 tablet (10 mg total) by mouth nightly 09/25/15   Petronaci, Sharene Butters, MD   tiotropium (SPIRIVA HANDIHALER) 18 MCG inhalation capsule Inhale 1 capsule (18 mcg total) into the lungs daily 09/25/15   Petronaci, Sharene Butters, MD    pantoprazole (PROTONIX) 40 MG EC tablet Take 1 tablet (40 mg total) by mouth daily   Swallow whole. Do not crush, break, or chew.  Patient taking differently: Take 40 mg by mouth every morning   Swallow whole. Do not crush, break, or chew.  09/14/15   Verdene Lennert, MD   metoprolol (LOPRESSOR) 25 MG tablet Take 0.5 tablets (12.5 mg total) by mouth 2 times daily 09/04/15   Verdene Lennert, MD   fluticasone [facility]) 50 MCG/ACT nasal spray INHALE 2 SPRAYS INTO EACH NOSTRIL EVERY DAY 01/09/15   Petronaci, Sharene Butters, MD   atorvastatin (LIPITOR) 40 MG tablet Take 40 mg by mouth daily (with dinner)    [provider]   azelastine (ASTELIN) 0.1 % nasal spray 1 spray by Nasal route 2 times daily 11/18/14   Nance Pear, NP   Sodium Chloride-Sodium Bicarb (AYR SALINE NASAL NETI RINSE) 1.57 G PACK 1 packet by Nasal route daily 11/18/14   Nance Pear, NP   Cholecalciferol (VITAMIN D3) 5000 UNITS TABS Take 5,000 Units  by mouth every morning       [provider]   ipratropium-albuterol (DUONEB) 0.5-2.5 (3) MG/3ML nebulizer solution Take 3 mLs by nebulization every 6 hours 02/21/14   Pete Glatter, NP   aspirin 81 MG tablet Take 81 mg by mouth every morning       [provider]   loratadine (KLS ALLERCLEAR) 10 MG tablet Take 20 mg by mouth every morning       [provider]   docusate sodium (COLACE) 100 MG capsule Take 200 mg by mouth every morning       [provider]   clopidogrel (PLAVIX) 75 MG tablet Take 75 mg by mouth every morning       [provider]      (home meds)  Current Outpatient Prescriptions   Medication    HYDROcodone-acetaminophen (NORCO) 5-325 MG per tablet    albuterol HFA 108 (90 BASE) MCG/ACT inhaler    sodium chloride (OCEAN) 0.65 % nasal spray    SYMBICORT 160-4.5 MCG/ACT inhaler    sertraline (ZOLOFT) 50 MG tablet    montelukast (SINGULAIR) 10 MG tablet    tiotropium (SPIRIVA HANDIHALER) 18 MCG inhalation capsule     pantoprazole (PROTONIX) 40 MG EC tablet    metoprolol (LOPRESSOR) 25 MG tablet    fluticasone (FLONASE) 50 MCG/ACT nasal spray    atorvastatin (LIPITOR) 40 MG tablet    azelastine (ASTELIN) 0.1 % nasal spray    Sodium Chloride-Sodium Bicarb (AYR SALINE NASAL NETI RINSE) 1.57 G PACK    Cholecalciferol (VITAMIN D3) 5000 UNITS TABS    ipratropium-albuterol (DUONEB) 0.5-2.5 (3) MG/3ML nebulizer solution    aspirin 81 MG tablet    loratadine (KLS ALLERCLEAR) 10 MG tablet    docusate sodium (COLACE) 100 MG capsule    clopidogrel (PLAVIX) 75 MG tablet     No current facility-administered medications for this visit.       (current meds)  Allergies:  Morphine and Penicillins  Social:  reports that she has never smoked. She has never used smokeless tobacco. She reports that she does not drink alcohol or use illicit drugs.   Family Hx:  Family History   Problem Relation Age of Onset    Other Mother      "rare lung disease"    Aneurysm Mother      abdominal aortic aneurysm    Diabetes Type 2 Mother     Breast cancer Mother     Stroke Father     Stroke Brother      x2       VS: Blood pressure 150/65, pulse 70, temperature 35 C (95 F), resp. rate 16, height 1.702 m (5\' 7" ), weight 119.3 kg (263 lb), SpO2 96 %.    Pt reports persistent right V2 hypoesthesia to the lateral nose, upper lip and a few maxillary teeth. Says her eye still "leaks" a little. Pt reports compliance with sinus precautions and not wearing her CPAP. Denies major changes to vision. Says she sometimes has blurred vision when she is reading on the computer for an extended period of time. Has follow-up with her ophthalmologist.    Exam: AAOx3, NAD, No LAD, Facial asymmetry due to right periorbital edema/ecchymosis. CN VII intact. Right V2 hypoesthesia. PERRL. EOM intact and without pain at extremes.    A/P [name] is a 70 y.o. female approx 1 week s/p right orbital floor implant placement via transconjunctival approach, healing  and progressing well. Explained nature of  V2 injury from initial trauma as well as surgery. Pt understands this may never return to normal but may improve over the next 9-12 months. Pt to continue sinus precautions and not using CPAP for at least 1 more week. Pt to f/u with ophthalmologist for visual concerns. F/u with OMFS PRN.    Case discussed with Dr. Raina Mina M Florencia Zaccaro, DDS  01/12/16

## 2016-01-22 ENCOUNTER — Other Ambulatory Visit: Payer: Self-pay | Admitting: Primary Care

## 2016-01-22 DIAGNOSIS — R7303 Prediabetes: Secondary | ICD-10-CM

## 2016-01-22 DIAGNOSIS — K2970 Gastritis, unspecified, without bleeding: Secondary | ICD-10-CM

## 2016-02-02 ENCOUNTER — Telehealth: Payer: Self-pay

## 2016-02-02 NOTE — Telephone Encounter (Signed)
Received approval notification from [employer] regarding CPT RSPLY.     Faxing to [facility] and sending to scanning.

## 2016-03-08 ENCOUNTER — Other Ambulatory Visit: Payer: Self-pay | Admitting: Primary Care

## 2016-03-21 ENCOUNTER — Encounter: Payer: Self-pay | Admitting: Primary Care

## 2016-03-21 ENCOUNTER — Ambulatory Visit: Payer: Self-pay | Admitting: Primary Care

## 2016-03-21 VITALS — BP 132/66 | HR 84 | Ht 67.0 in | Wt 262.8 lb

## 2016-03-21 DIAGNOSIS — R209 Unspecified disturbances of skin sensation: Secondary | ICD-10-CM

## 2016-03-21 DIAGNOSIS — E669 Obesity, unspecified: Secondary | ICD-10-CM

## 2016-03-21 DIAGNOSIS — R7303 Prediabetes: Secondary | ICD-10-CM

## 2016-03-21 DIAGNOSIS — G2581 Restless legs syndrome: Secondary | ICD-10-CM

## 2016-03-21 DIAGNOSIS — K2970 Gastritis, unspecified, without bleeding: Secondary | ICD-10-CM

## 2016-03-21 DIAGNOSIS — Z23 Encounter for immunization: Secondary | ICD-10-CM

## 2016-03-21 DIAGNOSIS — F341 Dysthymic disorder: Secondary | ICD-10-CM

## 2016-03-21 DIAGNOSIS — J45909 Unspecified asthma, uncomplicated: Secondary | ICD-10-CM

## 2016-03-21 LAB — COMPREHENSIVE METABOLIC PANEL
ALT: 25 U/L (ref 0–35)
AST: 21 U/L (ref 0–35)
Albumin: 3.9 g/dL (ref 3.5–5.2)
Alk Phos: 148 U/L — ABNORMAL HIGH (ref 35–105)
Anion Gap: 16 (ref 7–16)
Bilirubin,Total: 0.2 mg/dL (ref 0.0–1.2)
CO2: 24 mmol/L (ref 20–28)
Calcium: 9.3 mg/dL (ref 8.6–10.2)
Chloride: 103 mmol/L (ref 96–108)
Creatinine: 0.81 mg/dL (ref 0.51–0.95)
GFR,Black: 85 *
GFR,Caucasian: 74 *
Glucose: 103 mg/dL — ABNORMAL HIGH (ref 60–99)
Lab: 18 mg/dL (ref 6–20)
Potassium: 5 mmol/L (ref 3.3–5.1)
Sodium: 143 mmol/L (ref 133–145)
Total Protein: 6.6 g/dL (ref 6.3–7.7)

## 2016-03-21 LAB — FERRITIN: Ferritin: 108 ng/mL (ref 10–120)

## 2016-03-21 LAB — CBC AND DIFFERENTIAL
Baso # K/uL: 0.1 10*3/uL (ref 0.0–0.1)
Basophil %: 1.1 %
Eos # K/uL: 0.3 10*3/uL (ref 0.0–0.4)
Eosinophil %: 3.9 %
Hematocrit: 41 % (ref 34–45)
Hemoglobin: 13 g/dL (ref 11.2–15.7)
IMM Granulocytes #: 0 10*3/uL (ref 0.0–0.1)
IMM Granulocytes: 0.4 %
Lymph # K/uL: 1.9 10*3/uL (ref 1.2–3.7)
Lymphocyte %: 24.7 %
MCH: 29 pg/cell (ref 26–32)
MCHC: 32 g/dL (ref 32–36)
MCV: 91 fL (ref 79–95)
Mono # K/uL: 0.6 10*3/uL (ref 0.2–0.9)
Monocyte %: 7.5 %
Neut # K/uL: 4.7 10*3/uL (ref 1.6–6.1)
Nucl RBC # K/uL: 0 10*3/uL (ref 0.0–0.0)
Nucl RBC %: 0 /100 WBC (ref 0.0–0.2)
Platelets: 255 10*3/uL (ref 160–370)
RBC: 4.5 MIL/uL (ref 3.9–5.2)
RDW: 13.3 % (ref 11.7–14.4)
Seg Neut %: 62.4 %
WBC: 7.6 10*3/uL (ref 4.0–10.0)

## 2016-03-21 LAB — FOLATE: Folate: 12.9 ng/mL (ref >=4.6)

## 2016-03-21 LAB — TSH: TSH: 3.38 u[IU]/mL (ref 0.27–4.20)

## 2016-03-21 LAB — VITAMIN B12: Vitamin B12: 413 pg/mL (ref 232–1245)

## 2016-03-21 LAB — MAGNESIUM: Magnesium: 1.8 mEq/L (ref 1.3–2.1)

## 2016-03-21 MED ORDER — PANTOPRAZOLE SODIUM 20 MG PO TBEC *I*
20.0000 mg | DELAYED_RELEASE_TABLET | Freq: Every day | ORAL | 3 refills | Status: DC
Start: 2016-03-21 — End: 2016-12-28

## 2016-03-21 NOTE — Progress Notes (Signed)
Outpatient Progress Note    SUBJECTIVE    [name]  is a 70 y.o. female  here for follow up visit.  Today we discussed:    Notes cold feet -- difficult to keep them warm and difficult to warm them up   No hand issues  Her feet do not turn white or blue  No symptoms with walking   Feels very fidgety, constantly moving her feet  Feet have been numb for a year or so     Notes easy bruising -- no history of this   This is also new for her, over approx the past six months   No change in her BMs but does have chronically alternating diarrhea and constipation. No blood in her stool a    Weight steady, overall has lost 55 pounds with [employer]    Face still doesn't feel normal after her facial fracture. Her R upper lip feels swollen.     Gastritis - feels she can't wean off her PPI completely due to refractory symptoms  Last EGD was years ago -- 8-9 years  On the 40mg  protonix dose for about 4-5 years    Using her inhaler on occasion, worse with the season change     The patient's past medical history, problem list, medications and allergies reviewed in electronic chart today.      OBJECTIVE    Blood pressure 132/66, pulse 84, height 1.702 m (5\' 7" ), weight 119.2 kg (262 lb 12.8 oz).   Body mass index is 41.16 kg/(m^2).   Gen: NAD, appears well, pleasant  Resp: Normal work of breathing, no wheezes, rales, or rhonchi  CV: RRR, nl s1, s2, soft systolic murmur, no JVP elevation  Ext: No edema, WWP, 1+ DP pulses bilaterally   Psych: normal affect    ASSESSMENT & PLAN  1. Cold extremities  She does have good perfusion on exam today. Will plan to check ABI given her CAD hx. This does not sound like Raynaud's per her description. Could be vascular vs rheumatologic vs physiologic changes of aging.  - CBC and differential; Future  - Ferritin; Future  - TSH; Future  - Hemoglobin A1c; Future  - Comprehensive metabolic panel; Future  - Vitamin B12; Future  - Folate; Future    2. Restless legs - this seems like a symptom  rather than qualifying as a full RLS diagnosis. Check ABI as above  Advised stretching prior to bed, maintaining electrolytes. Normal ferritin today   - TSH  - Hemoglobin A1c    3. Dysthymia  Well managed with sertraline   - Ferritin  - TSH    4. Obesity  Encouraged her ongoing efforts at weight loss  - Hemoglobin A1c; Future  - Comprehensive metabolic panel; Future    5. Need for influenza vaccination - Flu vaccine, high dose, greater than or equal to 11 yo, preservative free    6. Gastritis, presence of bleeding unspecified, unspecified chronicity, unspecified gastritis type  Discussed a goal of weaning her PPI - try 40 alternating with 20mg  of protonix  - Magnesium; Future  - pantoprazole (PROTONIX) 20 MG EC tablet; Take 1 tablet (20 mg total) by mouth daily   Swallow whole. Do not crush, break, or chew.  Dispense: 60 tablet; Refill: 3    7. Prediabetes  Stable on today's testing, lifestyle modification as above.     8. Asthma  Well controlled, no changes today     Updating her echo with Dr. Kerin Ransom  Jan 4     PVX due in 2018     Pt agrees with and understands plan.    Return in about 6 months (around 09/18/2016) for CPE. Return to office sooner prn.     Verdene Lennert, MD

## 2016-03-22 LAB — HEMOGLOBIN A1C: Hemoglobin A1C: 6.1 % — ABNORMAL HIGH (ref 4.0–6.0)

## 2016-03-23 ENCOUNTER — Encounter: Payer: Self-pay | Admitting: Primary Care

## 2016-03-24 ENCOUNTER — Telehealth: Payer: Self-pay | Admitting: Primary Care

## 2016-03-24 DIAGNOSIS — R209 Unspecified disturbances of skin sensation: Secondary | ICD-10-CM

## 2016-03-24 DIAGNOSIS — I2510 Atherosclerotic heart disease of native coronary artery without angina pectoris: Secondary | ICD-10-CM

## 2016-03-24 NOTE — Telephone Encounter (Signed)
Please let her know - her results were normal, including her thyroid and iron levels, but because of her cold feet I do want to check to make sure she isn't having a circulation problem, so I want to do a test to check her blood vessels. Please transfer her up front to schedule ABI as ordered once results reviewed with her. I also a sent a letter with the results if she wants to see them. Thanks

## 2016-03-25 ENCOUNTER — Telehealth: Payer: Self-pay | Admitting: Primary Care

## 2016-03-25 DIAGNOSIS — R209 Unspecified disturbances of skin sensation: Secondary | ICD-10-CM

## 2016-03-25 NOTE — Telephone Encounter (Signed)
Writer called [facility] [phone] at [place] and sched pt for Thursday 11/16 at 1pm. They need a new order placed. CV ankle brachial index or VASC 35. Please place new order and send back to front, writer will call pt with appt info. Thanks

## 2016-03-25 NOTE — Telephone Encounter (Signed)
New order signed.

## 2016-03-25 NOTE — Telephone Encounter (Signed)
Information given to pt, understanding expressed.  [place] desk- please help pt schedule ABI.  She does not have any date or time preferences.

## 2016-03-26 NOTE — Telephone Encounter (Signed)
See other encounter.

## 2016-03-26 NOTE — Telephone Encounter (Signed)
Writer informed patient of appointment information.  Patient expressed understanding.

## 2016-03-28 ENCOUNTER — Ambulatory Visit
Admit: 2016-03-28 | Discharge: 2016-03-28 | Disposition: A | Discharge status: Home / Self Care | Payer: Medicare (Managed Care)

## 2016-03-28 DIAGNOSIS — R209 Unspecified disturbances of skin sensation: Secondary | ICD-10-CM

## 2016-03-30 LAB — CV ANKLE BRACHIAL INDEX
Left Brachial Index: 0.94 ratio
Left Brachial Pressure: 118 mmHg
Left Dorsalis Pedis Index: 1.17 ratio
Left Dorsalis Pedis Pressure: 147 mmHg
Left Posterior Tibial Index: 1.23 ratio
Left Posterior Tibial Pressure: 155 mmHg
Right Brachial Index: 1 ratio
Right Brachial Pressure: 126 mmHg
Right Dorsalis Pedis Index: 1.23 ratio
Right Dorsalis Pedis Pressure: 155 mmHg
Right Posterior Tibial Index: 1.21 ratio
Right Posterior Tibial Pressure: 153 mmHg

## 2016-04-01 ENCOUNTER — Telehealth: Payer: Self-pay | Admitting: Primary Care

## 2016-04-01 NOTE — Telephone Encounter (Signed)
Message left at home number for pt to call back.

## 2016-04-01 NOTE — Telephone Encounter (Signed)
-----   Message from Verdene Lennert, MD sent at 03/30/2016  2:50 PM EST -----  Please let her know, her leg testing showed good circulation. I was concerned about this as a source or her cold feet, but this is reassuring, and she does not need more testing. Thanks

## 2016-04-08 ENCOUNTER — Telehealth: Payer: Self-pay | Admitting: Primary Care

## 2016-04-08 NOTE — Telephone Encounter (Signed)
Pt called back ,gave information per Dr Adella Nissen, pt verbalized understanding.

## 2016-04-08 NOTE — Telephone Encounter (Signed)
See on communication

## 2016-04-10 ENCOUNTER — Other Ambulatory Visit: Payer: Self-pay | Admitting: Gastroenterology

## 2016-04-23 ENCOUNTER — Encounter: Payer: Self-pay | Admitting: Primary Care

## 2016-04-23 MED ORDER — ALBUTEROL SULFATE HFA 108 (90 BASE) MCG/ACT IN AERS *I*
2.0000 | INHALATION_SPRAY | RESPIRATORY_TRACT | 2 refills | Status: DC | PRN
Start: 2016-04-23 — End: 2019-06-01

## 2016-05-07 ENCOUNTER — Other Ambulatory Visit: Payer: Self-pay | Admitting: Primary Care

## 2016-05-07 ENCOUNTER — Other Ambulatory Visit: Payer: Self-pay | Admitting: Pulmonology

## 2016-05-07 DIAGNOSIS — F419 Anxiety disorder, unspecified: Secondary | ICD-10-CM

## 2016-05-07 NOTE — Telephone Encounter (Signed)
Last seen 03/2016, no future appointments

## 2016-05-31 ENCOUNTER — Encounter: Payer: Self-pay | Admitting: Gastroenterology

## 2016-06-05 ENCOUNTER — Encounter: Payer: Self-pay | Admitting: Gastroenterology

## 2016-06-05 ENCOUNTER — Encounter: Payer: Self-pay | Admitting: Pulmonology

## 2016-06-05 ENCOUNTER — Ambulatory Visit: Payer: Self-pay

## 2016-06-05 ENCOUNTER — Other Ambulatory Visit: Payer: Self-pay

## 2016-06-05 ENCOUNTER — Ambulatory Visit: Payer: Self-pay | Admitting: Pulmonology

## 2016-06-05 VITALS — BP 129/59 | HR 63 | Temp 97.5°F | Resp 16 | Ht 66.54 in | Wt 258.5 lb

## 2016-06-05 DIAGNOSIS — G4733 Obstructive sleep apnea (adult) (pediatric): Secondary | ICD-10-CM

## 2016-06-05 DIAGNOSIS — Z9989 Dependence on other enabling machines and devices: Secondary | ICD-10-CM

## 2016-06-05 DIAGNOSIS — J449 Chronic obstructive pulmonary disease, unspecified: Secondary | ICD-10-CM

## 2016-06-05 MED ORDER — TIOTROPIUM BROMIDE MONOHYDRATE 2.5 MCG/ACT IN AERS *I*
1.0000 | INHALATION_SPRAY | Freq: Every day | RESPIRATORY_TRACT | 11 refills | Status: DC
Start: 2016-06-05 — End: 2017-03-29

## 2016-06-05 MED ORDER — CPAP MACHINE *A*
0 refills | Status: AC
Start: 2016-06-05 — End: ?

## 2016-06-05 NOTE — Progress Notes (Signed)
Dear Doctor Verdene Lennert, MD:      [name] was seen in [facility] on 06/05/2016 in follow up for shortness of breath, morbid obesity, asthma, and OSA.   As you know, [name] is a 71 y.o. female patient who was last seen in January 2017.      Since our last correspondence, [name] reports she has been doing well.  She had no interval illnesses.   She is using Symbicort, Spiriva, and albuterol.  She uses the albuterol infrequently.   She is having some trouble with the Handihaler capsule.  She is taking Singulair at night.  She has had the seasonal influenza vaccine.      She did not travel in the past year but she had a good summer at the pool.  She has lost 55 pounds using [employer].  She denies fever, chills, cough, and chest pain.  She is managing her reflux disease.      [name] continues to use CPAP nightly. She sleeps through the night and feels rested during the day.   She had a right orbital fracture back in September.     Medications were reconciled in the EMR.  Pulmonary medications as indicated above.      On my exam,Blood pressure 129/59, pulse 63, temperature 36.4 C (97.5 F), resp. rate 16, height 1.69 m (5' 6.54"), weight 117.3 kg (258 lb 8 oz), SpO2 97 %.  No acute distress. HEENT exam notable for anicteric sclera.  She has an ecchymosis around the right eye.  Oropharynx clear with no evidence of thrush. There was no palpable cervical or supraclavicular adenopathy. Cardiac tones were normal with no murmur, gallop or rub.  Lung exam revealed good air entry bilaterally with no rales, rhonchi or wheezing.  Abdomen was soft and nontender with no palpable masses.  There was no lower extremity edema. She was alert and oriented and had a grossly nonfocal neurological exam.    Spirometry was performed at this visit and revealed a reduced FEV1/FVC ratio of 62, with a moderately reduced FEV1 of 1.56 liters (62% of predicted) and a mildly reduced FVC of 2.50  liters (76% of predicted).  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        Impression and Recommendations: [name] is a 71 y.o. female patient with dyspnea/asthma, morbid obesity, and OSA.   Her spirometry is stable and she is using her CPAP.  [name] has lost weight and continues to exercise.  She is doing quite well.     At this time my recommendations include:    1.  Continue Symbicort, Sprivia, Singulair, albuterol and CPAP.    I changed the Spiriva Handihaler to Respimat and we reviewed inhaler technique.  2.  Continue exercise and weight loss.   3.  I will obtain a CPAP download.   4.  Follow up with me in 1 year with spirometry.  I am happy to see her sooner if necessary.   5.  Influenza vaccine in the fall when available.     Thank you for allowing me to continue in the care of [employer].  Please do not hesitate to contact me if you have any questions or comments.       Sincerely,    Maurine Simmering, MD  Pulmonary and Critical Care Medicine

## 2016-06-05 NOTE — Progress Notes (Signed)
Learning Outcomes: To accurately demonstrate the use of Spiriva Respimat    Current Understanding : None    Readiness to Learn : Asks Questions and Eager to Learn    Person Taught : Patient    Method : Demonstration, One to One and Viewed video instructions    Outcome : Needs Reinforcement                                                                                                                       Comment : Printed instructions provided for home reference    Taught by/Discipline: Respiratory Therapist    Name : Towanda Malkin, RRT

## 2016-06-05 NOTE — Patient Instructions (Addendum)
Your breathing test is better than last year.   I will check a download on your CPAP machine to make sure the settings are okay in light of your weight loss  Continue with the weight loss!  I am very proud of you!!  Continue Symbicort, I substituted a different form of the Spiriva and we reviewed technique   See you in 1 year with a breathing test.  Call if you need to be seen sooner.  Get the flu shot in the fall.         Tiotropium (ty oh TRO pee um)    Brand Names: [place] Spiriva HandiHaler; Spiriva Respimat   Brand Names: [place] Spiriva; Spiriva Respimat   What is this drug used for?    It is used to open the airways in lung diseases where spasm may cause breathing problems.  What do I need to tell my doctor BEFORE I take this drug?    If you have an allergy to tiotropium, atropine, ipratropium, or any other part of this drug.   If you are allergic to any drugs like this one, any other drugs, foods, or other substances. Tell your doctor about the allergy and what signs you had, like rash; hives; itching; shortness of breath; wheezing; cough; swelling of face, lips, tongue, or throat; or any other signs.   If you take other drugs called anticholinergics, like ipratropium or oxybutynin. Ask your doctor if you are not sure if any of your drugs are anticholinergic.   This is not a list of all drugs or health problems that interact with this drug.   Tell your doctor and pharmacist about all of your drugs (prescription or OTC, natural products, vitamins) and health problems. You must check to make sure that it is safe for you to take this drug with all of your drugs and health problems. Do not start, stop, or change the dose of any drug without checking with your doctor.  What are some things I need to know or do while I take this drug?    Tell dentists, surgeons, and other doctors that you use this drug.   Avoid driving and doing other tasks or actions that call for you to be alert until you see how this drug  affects you.   This drug is not to be used to treat intense flare-ups of shortness of breath. Use a rescue inhaler. Talk with the doctor.   Unsafe allergic effects may rarely happen.   Call your doctor right away if your breathing problems get worse, if your rescue inhaler does not work as well, or if you need to use your rescue inhaler more often.   If you are 50 or older, use this drug with care. You could have more side effects.   Tell your doctor if you are pregnant or plan on getting pregnant. You will need to talk about the benefits and risks of using this drug while you are pregnant.   Tell your doctor if you are breast-feeding. You will need to talk about any risks to your baby.  What are some side effects that I need to call my doctor about right away?    WARNING/CAUTION: Even though it may be rare, some people may have very bad and sometimes deadly side effects when taking a drug. Tell your doctor or get medical help right away if you have any of the following signs or symptoms that may be related to a very bad side effect:  Signs of an allergic reaction, like rash; hives; itching; red, swollen, blistered, or peeling skin with or without fever; wheezing; tightness in the chest or throat; trouble breathing or talking; unusual hoarseness; or swelling of the mouth, face, lips, tongue, or throat.   Very bad nose irritation.   Very bad throat irritation.   Change in eyesight, eye pain, or very bad eye irritation.   Eye redness.   Not able to pass urine.   Pain when passing urine.   Passing urine more often.   A heartbeat that does not feel normal.   Chest pain or pressure or a fast heartbeat.   Redness or white patches in mouth or throat.   This drug can cause very bad breathing problems right after you take a dose. Sometimes, this may be life-threatening. If you have trouble breathing, breathing that is worse, wheezing, or coughing after using this drug, use a rescue inhaler and get medical  help right away.  What are some other side effects of this drug?    All drugs may cause side effects. However, many people have no side effects or only have minor side effects. Call your doctor or get medical help if any of these side effects or any other side effects bother you or do not go away:   Hard stools (constipation).   Dry mouth.   Upset stomach.   Nose and throat irritation.   Runny nose.   These are not all of the side effects that may occur. If you have questions about side effects, call your doctor. Call your doctor for medical advice about side effects.   You may report side effects to your national health agency.  How is this drug best taken?    Use this drug as ordered by your doctor. Read all information given to you. Follow all instructions closely.   All products:   To gain the most benefit, do not miss doses.   Use as you have been told, even if you feel well.   Have your puffer (inhaler) use checked with your doctor at each visit. Read and follow facts on how to use the puffer. Make sure you use the puffer the right way.   If using more than 1 type of puffer (inhaler), ask the doctor which puffer to use first.   Do not use a spacer with the puffer (inhaler).   Use new puffer (inhaler) with each refill.   Capsules for breathing in:   Do not swallow capsule. The contents of the capsule will be breathed into the lungs.   Spiriva and Respimat:   Prepare before first use or when puffer has not been used for more than 21 days. Spray towards the ground until mist is seen. Once the mist is seen, repeat 3 more times. If it has been more than 3 days since it has been used, spray once at the ground.   Keep out of your eyes.   Follow how to clean carefully.   After all sprays have been used, the puffer (inhaler) will lock.  What do I do if I miss a dose?    Take a missed dose as soon as you think about it.   If it is close to the time for your next dose, skip the missed dose and  go back to your normal time.   Do not take 2 doses at the same time or extra doses.  How do I store and/or throw out this drug?  All products:   Store at room temperature.   Store in a dry place. Do not store in a bathroom.   Keep all drugs out of the reach of children and pets.   Check with your pharmacist about how to throw out unused drugs.   Capsules for breathing in:   Store capsules in the original container. Use right after opening.   Spiriva and Respimat:   After putting together, throw away the puffer (inhaler) 3 months after first use or when the puffer (inhaler) locks.  General drug facts    If your symptoms or health problems do not get better or if they become worse, call your doctor.   Do not share your drugs with others and do not take anyone else's drugs.   Keep a list of all your drugs (prescription, natural products, vitamins, OTC) with you. Give this list to your doctor.   Talk with the doctor before starting any new drug, including prescription or OTC, natural products, or vitamins.   Some drugs may have another patient information leaflet. If you have any questions about this drug, please talk with your doctor, pharmacist, or other health care provider.   If you think there has been an overdose, call your poison control center or get medical care right away. Be ready to tell or show what was taken, how much, and when it happened.  Consumer Information Use and Disclaimer    This information should not be used to decide whether or not to take this medicine or any other medicine. Only the healthcare provider has the knowledge and training to decide which medicines are right for a specific patient. This information does not endorse any medicine as safe, effective, or approved for treating any patient or health condition. This is only a brief summary of general information about this medicine. It does NOT include all information about the possible uses, directions, warnings,  precautions, interactions, adverse effects, or risks that may apply to this medicine. This information is not specific medical advice and does not replace information you receive from the healthcare provider. You must talk with the healthcare provider for complete information about the risks and benefits of using this medicine.  Last Reviewed Date   2013-02-17  Copyright     2015 [employer] its affiliates and/or licensors. All rights reserved.   Please refer to the printed instructions given to you in clinic for your inhaler use.

## 2016-06-06 ENCOUNTER — Telehealth: Payer: Self-pay

## 2016-06-06 NOTE — Telephone Encounter (Signed)
Coversheet, script and office notes faxed to [facility]. Fax #: [phone]

## 2016-06-06 NOTE — Telephone Encounter (Signed)
Report received. To CLP's bin for review.

## 2016-09-19 ENCOUNTER — Encounter: Payer: Self-pay | Admitting: Primary Care

## 2016-09-20 ENCOUNTER — Encounter: Payer: Self-pay | Admitting: Primary Care

## 2016-09-20 ENCOUNTER — Ambulatory Visit: Payer: Medicare (Managed Care) | Attending: Primary Care | Admitting: Primary Care

## 2016-09-20 VITALS — BP 124/68 | HR 76 | Ht 66.54 in | Wt 263.9 lb

## 2016-09-20 DIAGNOSIS — Z8249 Family history of ischemic heart disease and other diseases of the circulatory system: Secondary | ICD-10-CM

## 2016-09-20 DIAGNOSIS — Z0000 Encounter for general adult medical examination without abnormal findings: Secondary | ICD-10-CM

## 2016-09-20 DIAGNOSIS — R7303 Prediabetes: Secondary | ICD-10-CM

## 2016-09-20 DIAGNOSIS — Z23 Encounter for immunization: Secondary | ICD-10-CM

## 2016-09-20 DIAGNOSIS — G2581 Restless legs syndrome: Secondary | ICD-10-CM | POA: Insufficient documentation

## 2016-09-20 DIAGNOSIS — F341 Dysthymic disorder: Secondary | ICD-10-CM

## 2016-09-20 DIAGNOSIS — K2970 Gastritis, unspecified, without bleeding: Secondary | ICD-10-CM

## 2016-09-20 DIAGNOSIS — N951 Menopausal and female climacteric states: Secondary | ICD-10-CM

## 2016-09-20 DIAGNOSIS — G4733 Obstructive sleep apnea (adult) (pediatric): Secondary | ICD-10-CM

## 2016-09-20 NOTE — Progress Notes (Signed)
Health Maintenance Visit, Female Aged 70+ Years    [name] is a 71 y.o. female here for a HCM Visit    Interval History    Follow-up issues:     Feet still get cold at times  Still getting restless leg symptoms at times - moderately bothersome  Allergies are acting up somewhat  Her face doesn't feel normal - her R upper lip feels sore and swollen and she always feels slightll congestion. She is on multiple allergy medications - claritin, flonase, singulair  She remains on her inhaler regimen. Requiring as needed medications a couple time a week.     Medications, allergies, medical, surgical, family, and social history reviewed today    Patient Active Problem List   Diagnosis Code    Dyspnea on exertion R06.09    Asthma J45.909    Severe Obstructive sleep apnea G47.33    Pulmonary nodule R91.1    Allergic rhinitis J30.9    CAD (coronary artery disease) I25.10    Prediabetes R73.03    Gastritis K29.70    Obesity E66.9    Dysthymia F34.1    Facial fracture due to fall S02.92XA, [id number]    Orbital floor fracture S02.30XA    Restless leg syndrome G25.81     Current Outpatient Prescriptions on File Prior to Visit   Medication Sig Dispense Refill    Misc. Devices (CPAP) machine Download and compliance report 1 each 0    Tiotropium Bromide Monohydrate 2.5 MCG/ACT AERS Inhale 1 puff into the lungs daily 1 Inhaler 11    montelukast (SINGULAIR) 10 MG tablet TAKE ONE TABLET BY MOUTH ONCE NIGHTLY 30 tablet 6    sertraline (ZOLOFT) 50 MG tablet TAKE ONE TABLET BY MOUTH ONCE DAILY 90 tablet 2    albuterol HFA 108 (90 BASE) MCG/ACT inhaler Inhale 2 puffs into the lungs every 4-6 hours as needed 1 Inhaler 2    metoprolol (LOPRESSOR) 25 MG tablet TAKE ONE-HALF TABLET BY MOUTH TWICE DAILY 90 tablet 2    sodium chloride (OCEAN) 0.65 % nasal spray 1 spray by Each Nare route as needed for Congestion 45 mL 3    SYMBICORT 160-4.5 MCG/ACT inhaler INHALE TWO PUFFS INTO THE LUNGS TWICE DAILY ... SHAKE  WELL BEFORE EACH USE. 2 Inhaler 11    fluticasone (FLONASE) 50 MCG/ACT nasal spray INHALE 2 SPRAYS INTO EACH NOSTRIL EVERY DAY 16 g 6    atorvastatin (LIPITOR) 40 MG tablet Take 40 mg by mouth daily (with dinner)      azelastine (ASTELIN) 0.1 % nasal spray 1 spray by Nasal route 2 times daily 30 mL 6    Sodium Chloride-Sodium Bicarb (AYR SALINE NASAL NETI RINSE) 1.57 G PACK 1 packet by Nasal route daily 1 each 0    Cholecalciferol (VITAMIN D3) 5000 UNITS TABS Take 5,000 Units by mouth every morning         ipratropium-albuterol (DUONEB) 0.5-2.5 (3) MG/3ML nebulizer solution Take 3 mLs by nebulization every 6 hours 90 mL 0    aspirin 81 MG tablet Take 81 mg by mouth every morning         loratadine (KLS ALLERCLEAR) 10 MG tablet Take 20 mg by mouth every morning         docusate sodium (COLACE) 100 MG capsule Take 200 mg by mouth every morning         clopidogrel (PLAVIX) 75 MG tablet Take 75 mg by mouth every morning  pantoprazole (PROTONIX) 20 MG EC tablet Take 1 tablet (20 mg total) by mouth daily   Swallow whole. Do not crush, break, or chew. 60 tablet 3     No current facility-administered medications on file prior to visit.        Social History     Social History    Marital status: Married     Spouse name: N/A    Number of children: N/A    Years of education: N/A     Occupational History    Not on file.     Social History Main Topics    Smoking status: Never Smoker    Smokeless tobacco: Never Used    Alcohol use No    Drug use: No    Sexual activity: Not on file     Social History Narrative    May 2016        Lives with her husband in an in law suite at her son's house        Three children, all live locally     Five grandchildren     Retired from school food service       Screening  Domestic violence (HITS)  How often does partner...   Physically hurt? 0   Insult or talk down? 0   Threaten with harm? 0   Scream or curse? 0    Depression (PHQ2)   Often been bothered by feeling  down, depressed or hopeless: 1   Often been bothered by little interest or pleasure in doing things: 0    Alcohol and Drug Use (CAGE-AID)   Tried to Cut down?  No     Annoyed by Criticism?  No     Felt Guilty?  No     Had an Eye opener?  No      Physical Exam   Blood pressure 124/68, pulse 76, height 1.69 m (5' 6.53"), weight 119.7 kg (263 lb 14.4 oz).  Body mass index is 41.91 kg/(m^2).  General Appearance:    Alert, cooperative, no distress, appears stated age   Head:    Normocephalic   Eyes:    Conjunctiva clear and without pallor, sclerae anicteric   Oropharynx:   Buccal mucosa normal; good dentition; oropharynx clear   Neck:   Supple, no thyromegaly   Breasts  Symmetric, no masses or skin changes   Lungs:     Clear to auscultation bilaterally without W/R/R, unlabored respiratory effort    Heart:    Regular rate and rhythm, S1 and S2 normal, stable 7-35 systolic murmur without rub or gallop. Well healed sternotomy scar   Abdomen:     Soft, non-tender, no masses, no hepatosplenomegaly   Extremities:   Warm and well perfused, legs w/o edema   Musculoskeletal:   Palms, fingers, and nails normal to inspection   Skin:   Warm, dry, good turgor, no rashes in visible areas   Lymph nodes:   Cervical and supraclavicular nodes normal   Neurologic:   A&O x 3, normal gait   Psychiatric:   Normal mood and affect   PHQ9: 4    ASSESSMENT & PLAN  Routine Well Adult Visit    Screening checklist (see orders)   Gyn care through [facility]? Yes If so, Pap up to date: N/A (stop screening after age 27 if adequate prior screening/no hx of high grade lesion)   Lipid screening  (USPSTF recommends >45 if risks - smoker, hypertensive, obese, or FH of premature CVD)  Hepatitis C testing (USPSTF recommends testing all patients born between 11-1962.  [place] law requires 1 time testing in same group)   Diabetes screening (USPSTF recommends screening in adults aged 27-70 years who are overweight or obese)   Colon cancer screening: UTD  (USPSTF recommends until age 39, after 2 only if life-expectancy > 10 years)   Lung cancer screening indicated: no  (USPSTF recommends yearly ages 40-80 if >/= 30 pack year history unless no tobacco use in last 15 years)   Osteoporosis discussed.  DEXA scan ordered: Yes (USPSTF recommends age 12 and consider repeat in 2 years to fully assess risk)   Breast Cancer screening discussed  Yes Mammogram: UTD (consider stopping at age 103)    Counseling/Anticipatory Guidance   Discussed importance of adequate Calcium and Vitamin D intake   Recommend Vitamin D supplementation daily for fall prevention (USPSTF recommends for at risk individuals >65)   Nutritional and exercise counseling provided (if CVD risk factors)   Advance directives discussed and HCP form given    Immunizations/Prophylaxis   The vaccination status of the following vaccines were addressed at today's visit:   Prevnar 57 - UTD (age 80+, if no prior Pneumovax give Prevnar 13 then Pneumovax in 8 weeks.  If prior Pneumovax give Prevnar if > 1 year).   Pneumovax - tday   Shingrix (one time dose after age 8) - discussed, will wait for now    Update labs in Nov    Orders Placed This Encounter   Procedures    Pneumococcal polysaccharide vaccine 23-valent greater than or equal to 2yo subcutaneous/IM    Comprehensive metabolic panel    Lipid add Rfx to Drt LDL if Trig >400    Hemoglobin A1c    Hepatitis C antibody    Magnesium      1. Encounter for preventive health examination  Routine care and counseling today   - Comprehensive metabolic panel; Future  - Lipid add Rfx to Drt LDL if Trig >400; Future  - Hemoglobin A1c; Future  - Hepatitis C antibody; Future  - DEXA Scan; Future  - CV AAA Screening; Future  - Magnesium; Future  - Pneumococcal polysaccharide vaccine 23-valent greater than or equal to 2yo subcutaneous/IM    2. Restless leg syndrome  Discussed medication options, pt defers for now    3. Prediabetes  Lifestyle counseling today   -  Magnesium; Future    4. Severe Obstructive sleep apnea  Continues with CPAP    5. Dysthymia  Stable, cont sertraline    6. Menopausal state - DEXA Scan; Future    7. Family history of aortic aneurysm  Mom died of a ruptured AAA - discussed w/pt and she would like to be tested for this  - CV AAA Screening; Future    8. Gastritis, presence of bleeding unspecified, unspecified chronicity, unspecified gastritis type  Discuss PPI wean next visit    Next periodic health supervision visit due in 1 year    Verdene Lennert, MD

## 2016-09-25 ENCOUNTER — Telehealth: Payer: Self-pay | Admitting: Primary Care

## 2016-09-25 NOTE — Telephone Encounter (Signed)
Writer called [facility] [phone] to schedule pt for U/S AAA on Friday May 18th at 9:30am at [address]. Writer called pt and gave her info and pt expressed understanding.

## 2016-09-27 ENCOUNTER — Ambulatory Visit
Admission: RE | Admit: 2016-09-27 | Discharge: 2016-09-27 | Disposition: A | Discharge status: Home / Self Care | Payer: Medicare (Managed Care) | Source: Ambulatory Visit | Attending: Primary Care | Admitting: Primary Care

## 2016-09-27 DIAGNOSIS — Z8249 Family history of ischemic heart disease and other diseases of the circulatory system: Secondary | ICD-10-CM

## 2016-09-27 DIAGNOSIS — Z0000 Encounter for general adult medical examination without abnormal findings: Secondary | ICD-10-CM

## 2016-09-29 ENCOUNTER — Encounter: Payer: Self-pay | Admitting: Primary Care

## 2016-09-29 LAB — CV AAA SCREENING
Aorta Diameter A-P Distal: 1.85 cm
Aorta Diameter A-P Mid: 1.85 cm
Aorta Diameter A-P Prox: 1.59 cm
Aorta Diameter Trans Distal: 2.08 cm
Aorta Diameter Trans Mid: 1.85 cm
Aorta Diameter Trans Prox: 1.8 cm
Aorta EDV Mid: 13.93 cm/s
Aorta PSV Mid: 177.09 cm/s

## 2016-10-30 ENCOUNTER — Ambulatory Visit: Payer: Medicare (Managed Care) | Attending: Primary Care

## 2016-10-30 DIAGNOSIS — Z780 Asymptomatic menopausal state: Secondary | ICD-10-CM

## 2016-10-30 DIAGNOSIS — Z0000 Encounter for general adult medical examination without abnormal findings: Secondary | ICD-10-CM

## 2016-10-30 DIAGNOSIS — N951 Menopausal and female climacteric states: Secondary | ICD-10-CM

## 2016-10-30 DIAGNOSIS — M8589 Other specified disorders of bone density and structure, multiple sites: Secondary | ICD-10-CM

## 2016-10-31 NOTE — Procedures (Signed)
Providers: DXA scan for your review.  Images and report are located under the imaging and/or media tab.  For Mychart active patients, please contact your referring provider for results and/or for  requests for a copy of this result.

## 2016-12-10 ENCOUNTER — Encounter: Payer: Self-pay | Admitting: Primary Care

## 2016-12-10 MED ORDER — METOPROLOL TARTRATE 25 MG PO TABS *I*
12.5000 mg | ORAL_TABLET | Freq: Two times a day (BID) | ORAL | 2 refills | Status: DC
Start: 2016-12-10 — End: 2017-02-28

## 2016-12-10 NOTE — Telephone Encounter (Signed)
Last office visit with [facility]: 09/20/2016  Next appointment at [facility]: 03/28/2017   Future Appointments  Date Time Provider [facility]   03/28/2017 11:10 AM Verdene Lennert, MD [facility] None   04/14/2017 9:20 AM [name], MD [facility] None   06/09/2017 2:45 PM MED [facility] PULMONARY LAB 3 [facility] PULMONAR   06/09/2017 3:30 PM Petronaci, Sharene Butters, MD [facility] PULMONAR

## 2016-12-11 ENCOUNTER — Ambulatory Visit: Payer: Medicare (Managed Care) | Attending: Primary Care | Admitting: Primary Care

## 2016-12-11 ENCOUNTER — Encounter: Payer: Self-pay | Admitting: Primary Care

## 2016-12-11 VITALS — BP 112/70 | HR 89 | Temp 98.6°F | Ht 66.54 in | Wt 265.0 lb

## 2016-12-11 DIAGNOSIS — Z880 Allergy status to penicillin: Secondary | ICD-10-CM

## 2016-12-11 DIAGNOSIS — H6120 Impacted cerumen, unspecified ear: Secondary | ICD-10-CM

## 2016-12-11 DIAGNOSIS — J0110 Acute frontal sinusitis, unspecified: Secondary | ICD-10-CM

## 2016-12-11 MED ORDER — IPRATROPIUM BROMIDE 0.06 % NA SOLN *I*
2.0000 | Freq: Three times a day (TID) | NASAL | 0 refills | Status: DC
Start: 2016-12-11 — End: 2020-06-15

## 2016-12-11 MED ORDER — DOXYCYCLINE HYCLATE 100 MG PO CAPS *I*
100.0000 mg | ORAL_CAPSULE | Freq: Two times a day (BID) | ORAL | 0 refills | Status: AC
Start: 2016-12-11 — End: 2016-12-18

## 2016-12-11 NOTE — Patient Instructions (Signed)
Sinusitis Discharge Instructions, Adult    About this topic   The sinuses are air-filled spaces inside the head. They are found behind your forehead, nose, cheeks, and eyes. The spaces are lined with small hairs that clean the sinuses.  Sinusitis means that your sinuses are swollen, inflamed, or infected. This happens when the small hairs that clean the sinuses do not work, or when the opening to the sinuses is blocked. Mucus is trapped inside the sinuses and causes pain. The block may be caused by:  · Colds ? This is the most common reason.  · Allergies  · Curving or bending of the wall that separates your nose. This is a deviated septum.  · Extra bony growths inside the nose. These are called nasal bone spurs.  · Chemical irritation from cigarette smoke or other irritating odors  Signs can last for up to 4 weeks or may be long-lasting. They may also appear again in a few months after you feel better. Doctors may treat sinusitis by giving drugs. Surgery may be needed if sinusitis happens again and again.     What care is needed at home?   · Ask your doctor what you need to do when you go home. Make sure you ask questions if you do not understand what you need to do.  · Your doctor may order a saline nose rinse to help clear your sinuses.  · Drink 6 to 8 glasses of water each day to help thin mucus.  · Use two or three pillows under your head and shoulders when you sleep. This will help your sinuses drain.  · Drape a towel over your head as you breathe in the steam from a bowl of warm water. This will help moisturize your sinuses. This may also drain clogged sinuses.  · Use a warm compress to your face to ease facial pain.  What follow-up care is needed?   · Your doctor may ask you to make visits to the office to check on your progress. Be sure to keep your visits.  · Your doctor will tell you if other tests are needed.  · Your doctor may send you for allergy tests or to an allergy expert.   What lifestyle  changes are needed?   · Avoid drinks that contain caffeine or alcohol.  · Try to stop smoking. Avoid being around others who smoke. Smoke can damage the small hairs inside your sinuses.  What drugs may be needed?   The doctor may order drugs to:  · Help with pain and swelling  · Fight an infection  · Control coughing  · Dry up the sinuses  · Help a runny or stuffy nose  Will physical activity be limited?   You do not have to limit your activity. You may want to rest more if you have a fever or headache.   What problems could happen?   · Infections that happen again and again  · Asthma attack  · Coughing  · Loss of voice  What can be done to prevent this health problem?   · Keep your nose as moist as possible. Use saline sprays, washes, and a humidifier often.  · Avoid being around cigarette and cigar smoke or strong odors from chemicals.  · Avoid long periods of swimming in pools treated with chlorine. This can bother the lining of the nose and sinuses.  · Avoid water diving. This forces water into the sinuses from the nasal passages.  ·   Manage your allergies with your doctor's help.  · Use an air conditioner if allergies are a problem.  · Before air travel, use a drug to dry up mucus. As the plane takes off or lands, the pressure can cause sinus pain.  When do I need to call the doctor?   · Signs of infection. These include a fever of 100.4°F (38°C) or higher, chills.  · Sudden breathing problems  · You are not feeling better in 2 or 3 days or you are feeling worse  Teach Back: Helping You Understand   The Teach Back Method helps you understand the information we are giving you. The idea is simple. After talking with the staff, tell them in your own words what you were just told. This helps to make sure the staff has covered each thing clearly. It also helps to explain things that may have been a bit confusing. Before going home, make sure you are able to do these:  · I can tell you about my condition.  · I can  tell you what may help ease my breathing.  · I can tell you what I will do if I have a fever, chills, or trouble breathing.  Where can I learn more?   American Rhinologic Society  http://care.american-rhinologic.org/adult_sinusitis   National Institute of Allergy and Infectious Diseases  http://www.niaid.nih.gov/topics/sinusitis/Pages/index.aspx   Last Reviewed Date   2010-12-18  Consumer Information Use and Disclaimer   This information is not specific medical advice and does not replace information you receive from your health care provider. This is only a brief summary of general information. It does NOT include all information about conditions, illnesses, injuries, tests, procedures, treatments, therapies, discharge instructions or life-style choices that may apply to you. You must talk with your health care provider for complete information about your health and treatment options. This information should not be used to decide whether or not to accept your health care provider’s advice, instructions or recommendations. Only your health care provider has the knowledge and training to provide advice that is right for you.  Copyright   Copyright © 2015 [employer] and its affiliates and/or licensors. All rights reserved.

## 2016-12-11 NOTE — Progress Notes (Signed)
GENERAL: NAD. Conversant. Pleasant.    PRE-PROCEDURE EXAM (RIGHT EAR):  Impacted cerumen obstructs visualization of the TM    PROCEDURE INDICATION: Remove cerumen to visualize ear drum, improve hearing, and relieve discomfort    CONSENT:  Verbal consent for disimpaction of both left and right ear canals obtained.    RIGHT EAR PROCEDURE:  Cerumen boluses with debris were removed via low pressure and low flow warm water irrigation of the ear canal.  Procedure was well tolerated.    POST-PROCEDURE EXAM (RIGHT EAR):  Ear canal now clear of cerumen and debris  (greater than 98%).  TM visualized and found to be intact without erythema or perforation.  Hearing restored.    POST-PROCEDURE INSTRUCTIONS:  Avoid using Q-tips.

## 2016-12-11 NOTE — Progress Notes (Signed)
Outpatient Progress Note    SUBJECTIVE    [name]  is a 71 y.o. female  here for an acute visit.     Over the past month, patient is experiencing nasal congestion, nose pain, and ear pain. She states the symptoms are becoming worse. She denies fever, chills, nasal discharge, and headache. Her current form of treatment has been prescribed allergy medication- with no relief. Pt denies using any OTC preparations for congestion because she is concerned for medication interactions.     Review of Systems   (Positive items noted in bold, otherwise listed items are negative)  General: fatigue, weight change, loss of appetite, fever, night sweats, weakness  ENT: hearing difficulty, ear ringing or pain, sinus problems, nasal congestion, nosebleeds, oral lesions, dental problems, throat pain  CV: irregular heartbeat, racing heart, chest pain, leg swelling, leg pain with walking, DOE  Resp: shortness of breath, prolonged or productive cough, wheezing, pleuritic pain  Allergy and Immunology: itchy or watery eyes, itchy or runny nose, frequent infections    The patient's past medical history, problem list, medications and allergies reviewed in electronic chart today.      OBJECTIVE    Blood pressure 112/70, pulse 89, temperature 37 C (98.6 F), temperature source Temporal, height 1.69 m (5' 6.53"), weight 120.2 kg (265 lb), SpO2 94 %.   Body mass index is 42.09 kg/(m^2).   Gen: NAD, appears well, pleasant  HEENT: Right ear impacted with wax. Wax removed and tympanic membrane intact. Left ear TM intact. Buccal mucosa pink and moist, no exudate. Nasal turbinates pink. Cervical lymphadopathy. No sinus tenderness.   Lungs: clear to auscultation bilaterally with no wheezes, rales, or rhonchi  CV: RRR, normal S1,S2, no M/G/R  Extremities: warm and well perfused, no edema  Psych: normal affect    ASSESSMENT & PLAN  1. Acute non-recurrent frontal sinusitis  Based on the history and exam findings, the leading diagnosis  is sinusitis. Advised patient on the use of Atrovent, Neti pot and doxycycline. Educated patient to follow up if symptoms do not improve.   - ipratropium (ATROVENT) 0.06 % nasal spray; 2 sprays by Each Nare route 3 times daily  Dispense: 15 mL; Refill: 0  - doxycycline hyclate (VIBRAMYCIN) 100 MG capsule; Take 1 capsule (100 mg total) by mouth 2 times daily for 7 days  Dispense: 14 capsule; Refill: 0    2. H/O penicillin-type antibiotic allergy  Patient reports that over 20 years ago she had a severe allergy to PCN- where she broke out in hives. Referral made to allergy to confirm allergy.   - AMB REFERRAL TO ALLERGY/IMMUNOLOGY    Pt agrees with and understands plan.    Return if symptoms worsen or fail to improve. Return to office sooner prn.     Donette Larry, NP

## 2016-12-25 ENCOUNTER — Ambulatory Visit: Payer: Medicare (Managed Care) | Attending: Primary Care

## 2016-12-25 ENCOUNTER — Encounter: Payer: Self-pay | Admitting: Allergy/Immunology/Rheumatology

## 2016-12-25 ENCOUNTER — Ambulatory Visit: Payer: Medicare (Managed Care) | Attending: Primary Care | Admitting: Allergy/Immunology/Rheumatology

## 2016-12-25 DIAGNOSIS — J309 Allergic rhinitis, unspecified: Secondary | ICD-10-CM

## 2016-12-25 DIAGNOSIS — Z880 Allergy status to penicillin: Secondary | ICD-10-CM

## 2016-12-25 MED ORDER — HYDROCORTISONE ACETATE 1 % EX CREAM (MULTIPLE PATIENTS) *I*
1.0000 | TOPICAL_CREAM | Freq: Once | CUTANEOUS | Status: AC
Start: 2016-12-25 — End: 2016-12-26

## 2016-12-25 NOTE — Progress Notes (Signed)
Tolerated penicillin and ampicillin percutaneous and intradermal allergen skin testing without any difficulty.

## 2016-12-25 NOTE — Progress Notes (Signed)
Allergy Follow-Up Note    PRIMARY CARE PHYSICIAN: Verdene Lennert, MD    HPI:   [name] is a 71 y.o. female who is here for evaluation of penicillin allergy status.  She was last seen at our office in October 2016 by Conley Canal, NP for chronic rhinitis.    She reports a history of itchy head to toe rash that occurred on the 10th day of a 10 day course of penicillin. This happened at least 20 years ago. She had no associated angioedema or breathing problems. She believes that the rash was treated with Benadryl. She has been avoiding penicillins since that time. She does not know if she has taken an cephalosporin.     She continues to experience intermittent nasal and sinus congestion. She is using ipratropium nasal spray with good effect.     A recent sinus infection was treated with doxycycline and is now resolved.     Her asthma is well controlled. She is using her rescue inhaler once/week. She used her rescue inhaler once earlier today for chest tightness that she now attributes to anxiety. Her husband has been in and out of the hospital recently.       ROS :   Constitutional: Negative for fever or recent illness  Skin: Negative for rashes or hives  Eyes: Negative for redness or swelling  Ears/Nose: Negative for ear pain. See HPI  Mouth/Throat: Negative for throat pain or oral lesions  Respiratory: Negative for shortness of breath, wheezing or cough  Cardiac: Negative for chest pain  Gastrointestinal: Negative for abdominal pain, dysphagia, vomiting or diarrhea  Allergy/Immunologic: Negative for recent allergic reactions  Neurologic: Negative for headache  Psychiatric: Negative for mood changes    PAST MEDICAL HISTORY: Reviewed and updated as needed.   SOCIAL HISTORY: Reviewed and updated as needed. No relevant changes since last visit.  ALLERGIES: Morphine and Penicillins  IMMUNIZATIONS: up to date and documented including seasonal flu    CURRENT MEDICATIONS:   Current Outpatient  Prescriptions   Medication Sig    metoprolol (LOPRESSOR) 25 MG tablet Take 0.5 tablets (12.5 mg total) by mouth 2 times daily    Misc. Devices (CPAP) machine Download and compliance report    Tiotropium Bromide Monohydrate 2.5 MCG/ACT AERS Inhale 1 puff into the lungs daily    montelukast (SINGULAIR) 10 MG tablet TAKE ONE TABLET BY MOUTH ONCE NIGHTLY    sertraline (ZOLOFT) 50 MG tablet TAKE ONE TABLET BY MOUTH ONCE DAILY    SYMBICORT 160-4.5 MCG/ACT inhaler INHALE TWO PUFFS INTO THE LUNGS TWICE DAILY ... SHAKE WELL BEFORE EACH USE.    atorvastatin (LIPITOR) 40 MG tablet Take 40 mg by mouth daily (with dinner)    Cholecalciferol (VITAMIN D3) 5000 UNITS TABS Take 5,000 Units by mouth every morning       ipratropium-albuterol (DUONEB) 0.5-2.5 (3) MG/3ML nebulizer solution Take 3 mLs by nebulization every 6 hours    aspirin 81 MG tablet Take 81 mg by mouth every morning       loratadine (KLS ALLERCLEAR) 10 MG tablet Take 20 mg by mouth every morning       docusate sodium (COLACE) 100 MG capsule Take 200 mg by mouth every morning       clopidogrel (PLAVIX) 75 MG tablet Take 75 mg by mouth every morning       ipratropium (ATROVENT) 0.06 % nasal spray 2 sprays by Each Nare route 3 times daily    albuterol HFA 108 (90 BASE)  MCG/ACT inhaler Inhale 2 puffs into the lungs every 4-6 hours as needed    pantoprazole (PROTONIX) 20 MG EC tablet Take 1 tablet (20 mg total) by mouth daily   Swallow whole. Do not crush, break, or chew.    sodium chloride (OCEAN) 0.65 % nasal spray 1 spray by Each Nare route as needed for Congestion    fluticasone (FLONASE) 50 MCG/ACT nasal spray INHALE 2 SPRAYS INTO EACH NOSTRIL EVERY DAY    azelastine (ASTELIN) 0.1 % nasal spray 1 spray by Nasal route 2 times daily    Sodium Chloride-Sodium Bicarb (AYR SALINE NASAL NETI RINSE) 1.57 G PACK 1 packet by Nasal route daily        PHYSICAL EXAMINATION:   General:  Well-appearing, alert, and in no acute distress    Psych: Normal affect  for age   Skin: No visible rashes or skin lesions   Eye: No conjunctival erythema, no eyelid or periorbital edema   Ears: Canals normal, TM's visible bilaterally and normal   Nose: No significant discharge, nasal mucosa and turbinates are normal   Mouth/Throat: No oral lesions, posterior oropharynx without edema or erythema   Lungs:  Clear to auscultation bilaterally, no wheezing, breathing comfortably on room air    Chest/CV: Regular rate and rhythm, no appreciable murmurs   Neurologic: Moving all extremities, alert, mental status normal for age    TESTING:    Allergy Medication Testing Review Flowsheet 12/25/2016 12/25/2016   Type of testing preformed: Intradermal Percutaneous   (+)   Histamine 10/12 8/9   (-)   Neg Control 5/nf 0/0   PCN 10,000 units/ml 5/nf 0/0   Pre-pen (BPO):   major determinant 5/nf 0/0   Ampicillin:  1mg /ml 5/nf 0/0       IMPRESSION & PLAN:    [name] is a 71 y.o. female with history of rash associated with a course of penicillin >20 years ago.  Approximately 50-80% of patients with IgE-mediated penicillin allergy lose their sensitivity 5-10 years after reacting.     Skin testing today was negative for penicillin. Given the high sensitivity of allergy skin testing to penicillin (NPV approaches 100%), as well as major determinant of penicillin, for which testing today was also negative, it is highly unlikely that [name] would have an acute IgE-mediated allergic reaction to penicillin (or other beta-lactam agent) should she require this in the furture.     [name] will return at her earliest convenience for an oral challenge to penicillin to confirm tolerance.     Thank you for allowing [place] to participate in the care of your patient. Please do not hesitate to contact us with any questions or concerns.     Sincerely,    Juel Burrow, FNP

## 2016-12-25 NOTE — Patient Instructions (Addendum)
--   Skin testing today was negative for penicillin allergy.     -- Return at your earliest convenience for an oral challenge to penicillin. You will be given a single dose of penicillin, followed by a one-hour period of observation in the office. If you tolerate the penicillin without reaction you will no longer be considered allergic to penicillin.     -- Hold antihistamines (generic Zyrtec) for 5 days prior to oral challenge. You can continue to take all of your asthma inhalers, montelukast/Singulair, and your ipratropium nasal spray.

## 2016-12-28 ENCOUNTER — Other Ambulatory Visit: Payer: Self-pay | Admitting: Pulmonology

## 2016-12-28 ENCOUNTER — Other Ambulatory Visit: Payer: Self-pay | Admitting: Primary Care

## 2016-12-28 DIAGNOSIS — K2970 Gastritis, unspecified, without bleeding: Secondary | ICD-10-CM

## 2016-12-30 ENCOUNTER — Telehealth: Payer: Self-pay

## 2016-12-30 MED ORDER — PANTOPRAZOLE SODIUM 20 MG PO TBEC *I*
20.0000 mg | DELAYED_RELEASE_TABLET | Freq: Every day | ORAL | 3 refills | Status: DC
Start: 2016-12-30 — End: 2017-07-16

## 2016-12-30 MED ORDER — MONTELUKAST SODIUM 10 MG PO TABS *I*
10.0000 mg | ORAL_TABLET | Freq: Every evening | ORAL | 6 refills | Status: DC
Start: 2016-12-30 — End: 2017-08-08

## 2016-12-30 NOTE — Telephone Encounter (Signed)
Call to patient to review med challenge instructions for 8/22  She was just in office 8/15  Denies any medications that would interfere with testing  She is aware to eat breakfast prior to coming  She has no further questions

## 2016-12-30 NOTE — Telephone Encounter (Signed)
Last office visit with [facility]: 12/11/2016  Next appointment at [facility]: 03/28/2017   Future Appointments  Date Time Provider [place]   01/01/2017 8:30 AM Juel Burrow, FNP ALG None   01/01/2017 8:30 AM AIR, NURSE ALG None   03/28/2017 11:10 AM Verdene Lennert, MD NPF None   04/14/2017 9:20 AM [name], MD CAF None   06/09/2017 2:45 PM MED [facility] PULMONARY LAB 3 MPU [place] PULMONAR   06/09/2017 3:30 PM Petronaci, Sharene Butters, MD MPU [facility] PULMONAR

## 2017-01-01 ENCOUNTER — Ambulatory Visit: Payer: Medicare (Managed Care) | Attending: Primary Care | Admitting: Allergy/Immunology/Rheumatology

## 2017-01-01 ENCOUNTER — Encounter: Payer: Self-pay | Admitting: Primary Care

## 2017-01-01 ENCOUNTER — Ambulatory Visit: Payer: Medicare (Managed Care) | Attending: Primary Care

## 2017-01-01 ENCOUNTER — Encounter: Payer: Self-pay | Admitting: Allergy/Immunology/Rheumatology

## 2017-01-01 VITALS — BP 111/55 | HR 59 | Temp 97.5°F | Ht 66.0 in | Wt 262.0 lb

## 2017-01-01 DIAGNOSIS — Z880 Allergy status to penicillin: Secondary | ICD-10-CM

## 2017-01-01 DIAGNOSIS — Z0000 Encounter for general adult medical examination without abnormal findings: Secondary | ICD-10-CM | POA: Insufficient documentation

## 2017-01-01 MED ORDER — PENICILLIN V POTASSIUM 250 MG PO TABS *I*
250.0000 mg | ORAL_TABLET | Freq: Once | ORAL | Status: AC
Start: 2017-01-01 — End: 2017-01-01
  Administered 2017-01-01: 250 mg via ORAL

## 2017-01-01 NOTE — Progress Notes (Signed)
01/01/2017    Dear Dr. Adella Nissen,    The patient,  [name], is an established patient with medication allergy who is here for a drug challenge to penicillin.     History of allergic reactions includes a history of itchy head to toe rash that occurred on the 10th day of a 10 day course of penicillin. This happened at least 20 years ago. She had no associated angioedema or breathing problems. She believes that the rash was treated with Benadryl. She has been avoiding penicillins since that time. Today, patient is well and off antihistamines.     Skin prick testing performed 12/25/16:   Allergy Medication Testing Review Flowsheet 12/25/2016 12/25/2016   Type of testing preformed: Intradermal Percutaneous   (+)   Histamine 10/12 8/9   (-)   Neg Control 5/nf 0/0   PCN 10,000 units/ml 5/nf 0/0   Pre-pen (BPO):   major determinant 5/nf 0/0   Ampicillin:  1mg /ml 5/nf 0/0     Skin prick and intradermal testing were entirely negative in the setting of an appropriate histamine response.    [name]  has a past medical history of Arthritis; Asthma; CAD (coronary artery disease); DVT (deep venous thrombosis); PE (pulmonary embolism); Prediabetes; and Tubular adenoma (03/2015).    Current Outpatient Prescriptions on File Prior to Visit   Medication Sig Dispense Refill    montelukast (SINGULAIR) 10 MG tablet Take 1 tablet (10 mg total) by mouth nightly 30 tablet 6    pantoprazole (PROTONIX) 20 MG EC tablet Take 1 tablet (20 mg total) by mouth daily   Swallow whole. Do not crush, break, or chew. 60 tablet 3    metoprolol (LOPRESSOR) 25 MG tablet Take 0.5 tablets (12.5 mg total) by mouth 2 times daily 90 tablet 2    Misc. Devices (CPAP) machine Download and compliance report 1 each 0    Tiotropium Bromide Monohydrate 2.5 MCG/ACT AERS Inhale 1 puff into the lungs daily 1 Inhaler 11    sertraline (ZOLOFT) 50 MG tablet TAKE ONE TABLET BY MOUTH ONCE DAILY 90 tablet 2    albuterol HFA 108 (90 BASE) MCG/ACT inhaler  Inhale 2 puffs into the lungs every 4-6 hours as needed 1 Inhaler 2    sodium chloride (OCEAN) 0.65 % nasal spray 1 spray by Each Nare route as needed for Congestion 45 mL 3    SYMBICORT 160-4.5 MCG/ACT inhaler INHALE TWO PUFFS INTO THE LUNGS TWICE DAILY ... SHAKE WELL BEFORE EACH USE. 2 Inhaler 11    atorvastatin (LIPITOR) 40 MG tablet Take 40 mg by mouth daily (with dinner)      Cholecalciferol (VITAMIN D3) 5000 UNITS TABS Take 5,000 Units by mouth every morning         ipratropium-albuterol (DUONEB) 0.5-2.5 (3) MG/3ML nebulizer solution Take 3 mLs by nebulization every 6 hours 90 mL 0    aspirin 81 MG tablet Take 81 mg by mouth every morning         loratadine (KLS ALLERCLEAR) 10 MG tablet Take 20 mg by mouth every morning         docusate sodium (COLACE) 100 MG capsule Take 200 mg by mouth every morning         clopidogrel (PLAVIX) 75 MG tablet Take 75 mg by mouth every morning         ipratropium (ATROVENT) 0.06 % nasal spray 2 sprays by Each Nare route 3 times daily 15 mL 0    fluticasone (FLONASE) 50 MCG/ACT nasal  spray INHALE 2 SPRAYS INTO EACH NOSTRIL EVERY DAY 16 g 6    azelastine (ASTELIN) 0.1 % nasal spray 1 spray by Nasal route 2 times daily 30 mL 6    Sodium Chloride-Sodium Bicarb (AYR SALINE NASAL NETI RINSE) 1.57 G PACK 1 packet by Nasal route daily 1 each 0     No current facility-administered medications on file prior to visit.        Review of Systems:   Review of Systems - General ROS: negative for - chills or fever  ENT ROS: negative for - headaches, nasal discharge, sore throat or visual changes  Allergy and Immunology ROS: negative for - hives, nasal congestion or postnasal drip  Hematological and Lymphatic ROS: negative for - bleeding problems, bruising or swollen lymph nodes  Respiratory ROS: no cough, shortness of breath, or wheezing  Cardiovascular ROS: no chest pain or dyspnea on exertion  Gastrointestinal ROS: no abdominal pain, change in bowel habits, or black or bloody  stools  Musculoskeletal ROS: negative for - joint swelling or muscular weakness  Neurological ROS: no TIA or stroke symptoms  Dermatological ROS: negative for rash and changing skin lesions    Physical Examination: BP 111/55   Pulse 59   Temp 36.4 C (97.5 F) (Temporal)    Ht 1.676 m (5\' 6" )   Wt 118.8 kg (262 lb)   LMP  (LMP Unknown)   SpO2 95%   BMI 42.29 kg/m2;   General: Well-appearing, in no acute distress  Nose: nares patent, normal turbinates, no significant discharge  Pharynx: no erythema or lesions  Eyes: conjunctivae non-injected   Chest: breath sounds clear, equal, no adventitious sounds, no retration/flaring  Heart: regular rate and no murmurs, normal S1-S2 split, normal pulses  Skin: Clear, no excoriations or exanthems, no active eczematous patches      Procedure:  After obtaining verbal informed consent for oral ingestion challenge, the patient underwent a medication challenge for which she was given penicillin 250mg  PO x1 and was observed in the office for one hour post-administration. I periodically examined the patient and the exam was unchanged throughout the challenge. The challenge was negative and the patient was discharged home well.     Assessment/Plan:  Todays medication challenge to penicillin was negative. Penicillin will be removed from her allergy list. The implications of a negative drug challenge were discussed with the patient.  We discussed that the patient is at no greater risk than anyone in the general population for an allergic (IgE mediated hypersensitivity) reaction to this medication. It was reviewed that although it is unlikely that allergic symptoms develop after a negative challenge, the patient should be monitored at home and should be in contact with [place] if any symptoms develop.  We also discussed at this evaluation does not rule out to delayed reactions that can occur with any medication.      Thank you for the opportunity to participate in the care of your delightful  patient, [name].  Should there be any questions or concerns, please do not hesitate to contact me.    Sincerely,    Juel Burrow, NP

## 2017-01-01 NOTE — Patient Instructions (Signed)
--   You passed the oral challenge to penicillin today. You are no longer considered to be allergic to penicillin.   -- Penicillin will be removed from your list of allergies today. Please update all of your health care providers about this change.

## 2017-01-01 NOTE — Progress Notes (Signed)
Pt arrived for medication challenge to Penicillin.  Pt has a history of hives breakout "from head to toes" from penicillin.  Pt denies any allergy symptoms at present.  Feels well.  Skin tested: 12/25/16.  Medication Challenge risks discussed and consent signed.  Pt is not currently taking any antihistamines or other histamine blocking medications.    1st dose Penicillin 250 mg administered PO at 0827.   Pt observed for 1 hour.  Vital signs stable.   Denies any allergic symptoms  Provider in to see pt to discuss prior to discharge

## 2017-01-08 ENCOUNTER — Encounter: Payer: Self-pay | Admitting: Gastroenterology

## 2017-02-19 ENCOUNTER — Other Ambulatory Visit: Payer: Self-pay | Admitting: Primary Care

## 2017-02-19 DIAGNOSIS — F419 Anxiety disorder, unspecified: Secondary | ICD-10-CM

## 2017-02-19 NOTE — Telephone Encounter (Signed)
LOV:12/11/16  RTO:03/28/17    Labs are ordered to be done

## 2017-02-24 ENCOUNTER — Ambulatory Visit
Admit: 2017-02-24 | Discharge: 2017-02-24 | Disposition: A | Discharge status: Home / Self Care | Payer: Medicare (Managed Care)

## 2017-02-24 ENCOUNTER — Ambulatory Visit
Admission: AD | Admit: 2017-02-24 | Discharge: 2017-02-24 | Disposition: A | Discharge status: Home / Self Care | Payer: Medicare (Managed Care) | Source: Ambulatory Visit | Attending: Emergency Medicine | Admitting: Emergency Medicine

## 2017-02-24 DIAGNOSIS — J984 Other disorders of lung: Secondary | ICD-10-CM | POA: Insufficient documentation

## 2017-02-24 DIAGNOSIS — J9811 Atelectasis: Secondary | ICD-10-CM | POA: Insufficient documentation

## 2017-02-24 DIAGNOSIS — R05 Cough: Secondary | ICD-10-CM

## 2017-02-24 DIAGNOSIS — J45901 Unspecified asthma with (acute) exacerbation: Secondary | ICD-10-CM | POA: Insufficient documentation

## 2017-02-24 MED ORDER — ALBUTEROL SULFATE (2.5 MG/3ML) 0.083% IN NEBU *I*
2.5000 mg | INHALATION_SOLUTION | Freq: Once | RESPIRATORY_TRACT | Status: DC
Start: 2017-02-24 — End: 2017-02-24

## 2017-02-24 MED ORDER — PREDNISONE 20 MG PO TABS *I*
20.0000 mg | ORAL_TABLET | Freq: Every day | ORAL | 0 refills | Status: DC
Start: 2017-02-24 — End: 2017-02-28

## 2017-02-24 MED ORDER — IPRATROPIUM BROMIDE 0.02 % IN SOLN *I*
500.0000 ug | Freq: Once | RESPIRATORY_TRACT | Status: DC
Start: 2017-02-24 — End: 2017-02-24

## 2017-02-24 NOTE — UC Provider Note (Signed)
History     Chief Complaint   Patient presents with    Cough     cough, congestion, wheezing x 2 weeks     HPI Comments: 71yo female with a significant PMH for asthma, CAD, OSA, DOE, prediabetes, and obesity presenting with cough and wheezing x 2 weeks. Pt states it started off as a cold and now has settled in her chest. She states the cough has been worsening and is productive at times. She states she has been using her albuterol, spiriva, and symbicort with short term relief. She has not taken the Symbicort in the last few days because she has not picked it up from the pharmacy and she has not taken any of her inhalers today. She reports wheezing and shortness of breath with walking short distances. She has not noted any fevers, but gets sweaty at times.      History provided by:  Patient      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, on anticoagulation for ~2 years    PE (pulmonary embolism)     post-op after CABG, on anticoagulation for ~2 years    Prediabetes     Tubular adenoma 03/2015    colonoscopy         Patient Active Problem List   Diagnosis Code    Dyspnea on exertion R06.09    Asthma J45.909    Severe Obstructive sleep apnea G47.33    Pulmonary nodule R91.1    Allergic rhinitis J30.9    CAD (coronary artery disease) I25.10    Prediabetes R73.03    Gastritis K29.70    Obesity E66.9    Dysthymia F34.1    Facial fracture due to fall S02.92XA, [id number]    Orbital floor fracture S02.30XA    Restless leg syndrome G25.81    Health care maintenance Z00.00            Past Surgical History:   Procedure Laterality Date    APPENDECTOMY      CHOLECYSTECTOMY      CORONARY ANGIOPLASTY WITH STENT PLACEMENT      CORONARY ARTERY BYPASS GRAFT  2013    triple     CYST REMOVAL      both thumbs    PR REPAIR EYE BLOWOUT,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     Procedure: ORBIT ORIF;  Surgeon: Jannett Celestine, DDS;  Location: [facility] MAIN OR;  Service: OMFS    TONSILLECTOMY AND ADENOIDECTOMY       Family History   Problem Relation Age of Onset    Other Mother      "rare lung disease"    Aneurysm Mother      abdominal aortic aneurysm    Diabetes Type 2 Mother     Breast cancer Mother     Stroke Father     Stroke Brother      x2    Thyroid cancer Daughter           Social History   Substance Use Topics    Smoking status: Never Smoker    Smokeless tobacco: Never Used    Alcohol use No     Living Situation     Questions Responses    Patient lives with Spouse    Homeless No    Caregiver  for other family member No    External Services None    Employment Retired    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   HENT: Positive for congestion and rhinorrhea. Negative for ear pain, sinus pain and sinus pressure.    Respiratory: Positive for cough, shortness of breath and wheezing. Negative for chest tightness.    Neurological: Negative for headaches.       Physical Exam   Triage Vitals  Triage Start: Start, (02/24/17 1447)   First Recorded BP: 140/72, Temp: 36.4 C (97.6 F), Temp src: TEMPORAL Oxygen Therapy SpO2: 96 %, Oximetry Source: Rt Hand, O2 Device: None (Room air), Heart Rate: 83, (02/24/17 1432)  .      Physical Exam   Constitutional: She appears well-developed and well-nourished.   HENT:   Right Ear: Tympanic membrane and ear canal normal.   Left Ear: Tympanic membrane and ear canal normal.   Mouth/Throat: Uvula is midline, oropharynx is clear and moist and mucous membranes are normal.   Eyes: Pupils are equal, round, and reactive to light.   Neck: Normal range of motion.   Cardiovascular: Normal rate and regular rhythm.    Pulmonary/Chest: Effort normal. She has decreased breath sounds. She has no wheezes.   Lymphadenopathy:     She has no cervical adenopathy.    Nursing note and vitals reviewed.       Medical Decision Making        Initial Evaluation:  ED First Provider Contact     Date/Time Event User Comments    02/24/17 1433 ED First Provider Contact Rhodie Cienfuegos C Initial Face to Face Provider Contact          Patient was seen on: 02/24/2017        Assessment:  71 y.o.female comes to the Ur[facility] [place] with cough and wheezing x 2 weeks.    Differential Diagnosis includes:  Viral Bronchitis  Asthmatic bronchitis  Nasopharyngitis   Rhinosinusitis  Viral URI  Post Nasal Drip  Sinusitis   CAP      Plan:     Orders Placed This Encounter    *Chest standard frontal and lateral views    albuterol (PROVENTIL) nebulization 2.5 mg    ipratropium (ATROVENT) 0.02 % nebulizer solution 500 mcg    predniSONE (DELTASONE) 20 MG tablet       No results found for this or any previous visit (from the past 24 hour(s)).    Xray reviewed and no acute infiltrate noted.  S/p neb pt feeling a lot better and lungs CTAB  Pt afebrile and VSS. No indication for antibiotics at this time.      Final Diagnosis    ICD-10-CM ICD-9-CM   1. Asthmatic bronchitis with exacerbation, unspecified asthma severity, unspecified whether persistent J45.901 493.92       Continue your inhalers as directed. Try to pick up your symbicort within the next few days because this will help a lot.  Use your albuterol every 4-6 hours.  Take the prednisone as directed over the next 5 days  Follow up if symptoms worsen such as worsening shortness of breath or persistent fevers.    Current Discharge Medication List      New Medications    Details Last Dose Given Next Dose Due Script Given?   predniSONE (DELTASONE)  20 mg Dose: 20 mg  Take 20 mg by mouth daily    Quantity 5 tablet, Refill 0  Start date: 02/24/2017, End date: 03/01/2017                       Thank you Demetria Pore Nill for coming to [facility] for your health care concerns.    If your condition changes and/or worsens please follow up with her  primary doctor and/or return to the urgent care center.    If short of breath, chest pains or any other concerns please report to the emergency room.    In the event of an Emergency dial 911.      Pearlie Oyster, [place]    Supervising physician Foye Spurling, MD was immediately available          Pearlie Oyster, [place]  02/24/17 [phone]

## 2017-02-24 NOTE — ED Triage Notes (Signed)
cough, congestion, wheezing x 2 weeks    Triage Note   Liana Gerold, RN

## 2017-02-24 NOTE — Discharge Instructions (Signed)
Continue your inhalers as directed. Try to pick up your symbicort within the next few days because this will help a lot.  Use your albuterol every 4-6 hours.  Take the prednisone as directed over the next 5 days  Follow up if symptoms worsen such as worsening shortness of breath or persistent fevers.

## 2017-02-27 ENCOUNTER — Other Ambulatory Visit: Payer: Self-pay | Admitting: Primary Care

## 2017-02-27 DIAGNOSIS — J4540 Moderate persistent asthma, uncomplicated: Secondary | ICD-10-CM

## 2017-02-27 NOTE — Telephone Encounter (Signed)
Last office visit with [facility]: 12/11/2016  Next appointment at [facility]: 03/28/2017   Future Appointments  Date Time Provider [place]   03/28/2017 11:10 AM Verdene Lennert, MD NPF None   04/14/2017 9:20 AM [name], MD CAF None   06/09/2017 2:45 PM MED [facility] PULMONARY LAB 3 MPU [facility] PULMONAR   06/09/2017 3:30 PM Petronaci, Sharene Butters, MD MPU [facility] PULMONAR

## 2017-02-27 NOTE — Telephone Encounter (Signed)
[facility] fax over new rx for symbicort 160/4.5. Pt wanted [place] to xfer a rx from [place] for her symbicort 160/4.5 but that rx has expired.We will need a new rx sent to [facility]. Thank you.

## 2017-02-28 ENCOUNTER — Encounter: Payer: Self-pay | Admitting: Primary Care

## 2017-02-28 ENCOUNTER — Ambulatory Visit: Payer: Medicare (Managed Care) | Attending: Primary Care | Admitting: Primary Care

## 2017-02-28 ENCOUNTER — Telehealth: Payer: Self-pay | Admitting: Primary Care

## 2017-02-28 VITALS — BP 122/74 | HR 67 | Temp 96.1°F | Ht 66.0 in | Wt 264.0 lb

## 2017-02-28 DIAGNOSIS — Z23 Encounter for immunization: Secondary | ICD-10-CM

## 2017-02-28 DIAGNOSIS — J45909 Unspecified asthma, uncomplicated: Secondary | ICD-10-CM

## 2017-02-28 MED ORDER — IPRATROPIUM-ALBUTEROL 0.5-2.5 MG/3ML IN SOLN *I*
3.0000 mL | Freq: Four times a day (QID) | RESPIRATORY_TRACT | 1 refills | Status: DC
Start: 2017-02-28 — End: 2017-08-26

## 2017-02-28 MED ORDER — PREDNISONE 10 MG PO TABS *I*
ORAL_TABLET | ORAL | 0 refills | Status: DC
Start: 2017-02-28 — End: 2017-04-14

## 2017-02-28 MED ORDER — BUDESONIDE-FORMOTEROL FUMARATE 160-4.5 MCG/ACT IN AERO *I*
2.0000 | INHALATION_SPRAY | Freq: Two times a day (BID) | RESPIRATORY_TRACT | 11 refills | Status: DC
Start: 2017-02-28 — End: 2018-04-14

## 2017-02-28 NOTE — Telephone Encounter (Signed)
Left message to call office back

## 2017-02-28 NOTE — Progress Notes (Signed)
Outpatient Progress Note    SUBJECTIVE    [name]  is a 71 y.o. female  here for follow up visit.  Today we discussed:    F/u asthma exacerbation   Seen at [facility] 10/15 for asthma exacerbation after 2 weeks of coughing and wheezing. She had been using albuterol, spiriva, and symbicort but had run out of the symbicort. CXR was clear aside from a chronic area of scarring. Felt better with a neb. Treated with prednisone.     Feels slightly better but still wheezing at times, particular with exertion. No fevers - reports not typically running a fever.     Taking claritin daily - 20mg     The patient's past medical history, problem list, medications and allergies reviewed in electronic chart today.      OBJECTIVE    Blood pressure 122/74, pulse 67, temperature 35.6 C (96.1 F), temperature source Temporal, height 1.676 m (5\' 6" ), weight 119.7 kg (264 lb), SpO2 95 %.   Body mass index is 42.61 kg/(m^2).   Gen: NAD, appears well, pleasant  HEENT: PERRL, conjunctiva without injection, sclerae anicteric, oropharynx clear. Ear canals clear, TMs grey with +LR without erythema or retraction   Lymph: No cervical lymphadenopathy  Lungs: speaks comfortably in full sentences. Air movement is diminished throughout with no distinct wheezing. No rales or rhonchi  CV: RRR, normal S1,S2, no M/G/R  Extremities: warm and well perfused, no edema  Psych: normal affect    ASSESSMENT & PLAN  1. Asthma exacerbation slowly resolving   Needs to pick up her symbicort inhaler, which should now be available - rx sent this morning. Will plan to extend the prednisone course given diminished air movement on exam today. Cont 20mg  claritin daily. She will call if her symptoms persist.   - predniSONE (DELTASONE) 10 MG tablet; Take 3 pills daily for 4 days, then 2 pills daily for 4 days, then 1 pill daily for 4 days  Dispense: 24 tablet; Refill: 0  - ipratropium-albuterol (DUONEB) 0.5-2.5 MG/3ML nebulizer solution; Take 3 mLs by nebulization  every 6 hours  Dispense: 90 mL; Refill: 1    2. Need for immunization against influenza  - Flu vaccine, high dose, greater than or equal to 62 yo, preservative free    Pt agrees with and understands plan.    Transferring care to [place] - Dr. Laurance Flatten - as they have moved. She will see me one more time if needed in Nov depending on her recovery.     Verdene Lennert, MD     Current Outpatient Prescriptions on File Prior to Visit   Medication Sig Dispense Refill    budesonide-formoterol (SYMBICORT) 160-4.5 MCG/ACT inhaler Inhale 2 puffs into the lungs 2 times daily   Shake well before each use. 2 Inhaler 11    sertraline (ZOLOFT) 50 MG tablet TAKE 1 TABLET BY MOUTH EVERY DAY 90 tablet 1    montelukast (SINGULAIR) 10 MG tablet Take 1 tablet (10 mg total) by mouth nightly 30 tablet 6    pantoprazole (PROTONIX) 20 MG EC tablet Take 1 tablet (20 mg total) by mouth daily   Swallow whole. Do not crush, break, or chew. 60 tablet 3    Misc. Devices (CPAP) machine Download and compliance report 1 each 0    Tiotropium Bromide Monohydrate 2.5 MCG/ACT AERS Inhale 1 puff into the lungs daily 1 Inhaler 11    albuterol HFA 108 (90 BASE) MCG/ACT inhaler Inhale 2 puffs into the lungs every 4-6  hours as needed 1 Inhaler 2    sodium chloride (OCEAN) 0.65 % nasal spray 1 spray by Each Nare route as needed for Congestion 45 mL 3    fluticasone (FLONASE) 50 MCG/ACT nasal spray INHALE 2 SPRAYS INTO EACH NOSTRIL EVERY DAY 16 g 6    atorvastatin (LIPITOR) 40 MG tablet Take 40 mg by mouth daily (with dinner)      azelastine (ASTELIN) 0.1 % nasal spray 1 spray by Nasal route 2 times daily 30 mL 6    Sodium Chloride-Sodium Bicarb (AYR SALINE NASAL NETI RINSE) 1.57 G PACK 1 packet by Nasal route daily 1 each 0    Cholecalciferol (VITAMIN D3) 5000 UNITS TABS Take 5,000 Units by mouth every morning         aspirin 81 MG tablet Take 81 mg by mouth every morning         loratadine (KLS ALLERCLEAR) 10 MG tablet Take 20 mg by mouth every  morning         docusate sodium (COLACE) 100 MG capsule Take 200 mg by mouth every morning         clopidogrel (PLAVIX) 75 MG tablet Take 75 mg by mouth every morning         ipratropium (ATROVENT) 0.06 % nasal spray 2 sprays by Each Nare route 3 times daily 15 mL 0     No current facility-administered medications on file prior to visit.

## 2017-02-28 NOTE — Telephone Encounter (Signed)
Pt still coughing, pt scheduled at 1pm with Dr.Eisenberg for re-evaluation

## 2017-03-28 ENCOUNTER — Ambulatory Visit: Payer: Medicare (Managed Care) | Admitting: Primary Care

## 2017-03-29 ENCOUNTER — Other Ambulatory Visit: Payer: Self-pay | Admitting: Pulmonology

## 2017-04-09 LAB — HM MAMMOGRAPHY

## 2017-04-10 ENCOUNTER — Other Ambulatory Visit: Payer: Self-pay | Admitting: Gastroenterology

## 2017-04-14 ENCOUNTER — Other Ambulatory Visit
Admission: RE | Admit: 2017-04-14 | Discharge: 2017-04-14 | Disposition: A | Discharge status: Home / Self Care | Payer: Medicare (Managed Care) | Source: Ambulatory Visit | Attending: Primary Care | Admitting: Primary Care

## 2017-04-14 ENCOUNTER — Encounter: Payer: Self-pay | Admitting: Primary Care

## 2017-04-14 ENCOUNTER — Ambulatory Visit
Admission: RE | Admit: 2017-04-14 | Discharge: 2017-04-14 | Disposition: A | Discharge status: Home / Self Care | Payer: Medicare (Managed Care) | Source: Ambulatory Visit | Attending: Primary Care | Admitting: Primary Care

## 2017-04-14 ENCOUNTER — Ambulatory Visit: Payer: Medicare (Managed Care) | Admitting: Primary Care

## 2017-04-14 VITALS — BP 124/82 | HR 76 | Temp 97.4°F | Resp 18 | Ht 66.5 in | Wt 269.0 lb

## 2017-04-14 DIAGNOSIS — K219 Gastro-esophageal reflux disease without esophagitis: Secondary | ICD-10-CM

## 2017-04-14 DIAGNOSIS — M25561 Pain in right knee: Secondary | ICD-10-CM

## 2017-04-14 DIAGNOSIS — K130 Diseases of lips: Secondary | ICD-10-CM

## 2017-04-14 DIAGNOSIS — I350 Nonrheumatic aortic (valve) stenosis: Secondary | ICD-10-CM

## 2017-04-14 DIAGNOSIS — Z0000 Encounter for general adult medical examination without abnormal findings: Secondary | ICD-10-CM | POA: Insufficient documentation

## 2017-04-14 DIAGNOSIS — R911 Solitary pulmonary nodule: Secondary | ICD-10-CM

## 2017-04-14 DIAGNOSIS — I2510 Atherosclerotic heart disease of native coronary artery without angina pectoris: Secondary | ICD-10-CM

## 2017-04-14 DIAGNOSIS — R7303 Prediabetes: Secondary | ICD-10-CM | POA: Insufficient documentation

## 2017-04-14 DIAGNOSIS — J45909 Unspecified asthma, uncomplicated: Secondary | ICD-10-CM

## 2017-04-14 DIAGNOSIS — G4733 Obstructive sleep apnea (adult) (pediatric): Secondary | ICD-10-CM

## 2017-04-14 HISTORY — DX: Nonrheumatic aortic (valve) stenosis: I35.0

## 2017-04-14 LAB — CBC
Hematocrit: 43 % (ref 34–45)
Hemoglobin: 13.6 g/dL (ref 11.2–15.7)
MCH: 29 pg/cell (ref 26–32)
MCHC: 32 g/dL (ref 32–36)
MCV: 94 fL (ref 79–95)
Platelets: 283 10*3/uL (ref 160–370)
RBC: 4.6 MIL/uL (ref 3.9–5.2)
RDW: 13.3 % (ref 11.7–14.4)
WBC: 7.9 10*3/uL (ref 4.0–10.0)

## 2017-04-14 LAB — LIPID PANEL
Chol/HDL Ratio: 4
Cholesterol: 184 mg/dL
HDL: 46 mg/dL
LDL Calculated: 102 mg/dL
Non HDL Cholesterol: 138 mg/dL
Triglycerides: 178 mg/dL — AB

## 2017-04-14 LAB — COMPREHENSIVE METABOLIC PANEL
ALT: 36 U/L — ABNORMAL HIGH (ref 0–35)
AST: 34 U/L (ref 0–35)
Albumin: 4 g/dL (ref 3.5–5.2)
Alk Phos: 134 U/L — ABNORMAL HIGH (ref 35–105)
Anion Gap: 14 (ref 7–16)
Bilirubin,Total: 0.3 mg/dL (ref 0.0–1.2)
CO2: 23 mmol/L (ref 20–28)
Calcium: 9.6 mg/dL (ref 8.6–10.2)
Chloride: 105 mmol/L (ref 96–108)
Creatinine: 0.75 mg/dL (ref 0.51–0.95)
GFR,Black: 93 *
GFR,Caucasian: 81 *
Glucose: 98 mg/dL (ref 60–99)
Lab: 22 mg/dL — ABNORMAL HIGH (ref 6–20)
Potassium: 4.9 mmol/L (ref 3.3–5.1)
Sodium: 142 mmol/L (ref 133–145)
Total Protein: 6.6 g/dL (ref 6.3–7.7)

## 2017-04-14 LAB — VITAMIN B12: Vitamin B12: 313 pg/mL (ref 232–1245)

## 2017-04-14 LAB — MAGNESIUM: Magnesium: 1.8 mEq/L (ref 1.3–2.1)

## 2017-04-14 LAB — HEMOGLOBIN A1C: Hemoglobin A1C: 5.8 % — ABNORMAL HIGH

## 2017-04-14 LAB — MULTIPLE ORDERING DOCS

## 2017-04-14 MED ORDER — RANITIDINE HCL 150 MG PO TABS *I*
150.0000 mg | ORAL_TABLET | Freq: Two times a day (BID) | ORAL | 5 refills | Status: DC
Start: 2017-04-14 — End: 2017-11-26

## 2017-04-14 NOTE — Progress Notes (Signed)
[name] - Outpatient Progress Note    SUBJECTIVE    [name] is a 71 y.o. female here to establish care. She is transferring from Dr. Adella Nissen. She has the following concerns today, including:    1. R  knee pain - her R knee has been "giving out on her" x couple weeks. No injury or swelling. Pain when ambulating or standing up quickly.    2. Asthma - recent flare is resolved. She continues to cough, says this is not her baseline, but it does not bother her. She uses her albuterol inhaler 1-2x weekly for flares, and is adherent with Symbicort and Spiriva and montelukast. H/o pulm nodule in 2016, has not had repeat f/u since.     3. CAD - s/p CABG. followed by Dr. Kerin Ransom. Adherent with Plavix and aspirin, BB and statin..     4. GERD - heartburn managed with pantoprazole 20mg  daily, recently reduced from 40mg  daily. Pt says she experiences heartburn and nausea when she does not take this.    5. R upper lip discomfort - since she fell and fractured her face last year. Upon clarification, pt says her lip does not hurt, but it feels different than the rest of her lips. This discomfort with mild swelling comes and goes. Better in the morning after sleeping and not using her lips, exacerbated by talking or using her lips. Pt says that the R side of her face was numb for a while after her fall.          The patient's past medical history, past surgical history, family history, medications, and allergies were reviewed and updated and are as follows:  Patient Active Problem List     Patient Active Problem List    Diagnosis Date Noted    Pulmonary nodule 09/30/2014     Priority: High     CT 02/20/15:  Stable left lower lung 6 mm nodule.   No further imaging indicated.         CAD (coronary artery disease) 09/30/2014     Priority: Medium     Coronary artery disease   CABG 2013.    PCI to LAD in 8/13.    Coronary angiogram February of 2015 at [facility] after she had chest  pain and an abnormal stress test that revealed patent stent and 3/3 grafts patent. The ejection fraction was 60%.    Episode of atypical chest pain 04/2015, negative troponins, negative nuclear stress test.         Prediabetes 09/30/2014     Priority: Medium    Obesity 09/30/2014     Priority: Medium    Severe Obstructive sleep apnea 11/11/2013     Priority: Medium     Adherent with CPAP      Asthma 06/16/2013     Priority: Medium    Dyspnea on exertion 06/13/2013     Priority: Medium    Allergic rhinitis 09/30/2014     Priority: Low    Gastritis 09/30/2014     Priority: Low    Aortic stenosis 04/14/2017     Mild on echo 05/2016, plan to repeat in 8/2-019      Health care maintenance 01/01/2017     PCN allergy removed after negative allergy testing.       Restless leg syndrome 09/20/2016    Orbital floor fracture 01/04/2016    Facial fracture due to fall 12/28/2015    Dysthymia 09/14/2015  Past Medical History:   Diagnosis Date    Arthritis     Asthma     CAD (coronary artery disease)     DVT (deep venous thrombosis)     post-op after CABG, on anticoagulation for ~2 years    PE (pulmonary embolism)     post-op after CABG, on anticoagulation for ~2 years    Prediabetes     Tubular adenoma 03/2015    colonoscopy      Past Surgical History:   Procedure Laterality Date    APPENDECTOMY      CHOLECYSTECTOMY      CORONARY ANGIOPLASTY WITH STENT PLACEMENT      CORONARY ARTERY BYPASS GRAFT  2013    triple     CYST REMOVAL      both thumbs    PR REPAIR EYE BLOWOUT,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    Procedure: ORBIT ORIF;  Surgeon: Jannett Celestine, DDS;  Location: [facility] MAIN OR;  Service: OMFS    TONSILLECTOMY AND ADENOIDECTOMY       Family History   Problem Relation Age of Onset    Other Mother         "rare lung disease"    Aneurysm Mother         abdominal aortic aneurysm    Diabetes Type 2 Mother     Breast cancer Mother     Stroke Father     Stroke Brother         x2    Thyroid cancer  Daughter      Social History     Social History    Marital status: Married     Spouse name: N/A    Number of children: N/A    Years of education: N/A     Occupational History    Not on file.     Social History Main Topics    Smoking status: Never Smoker    Smokeless tobacco: Never Used    Alcohol use No    Drug use: No    Sexual activity: Not on file     Social History Narrative    May 2016        Lives with her husband in an in law suite at her son's house        Three children, all live locally     Five grandchildren     Retired from school food service         ROS  Constitutional--negative for fever and chills,  +10lb weight gain in the last year. +OSA on CPAP  Eyes-- negative for redness/irritation  +wearing glasses  ENMT:   --pos for rhinorrhea or congestion  --pos for hearing loss, neg for ear pain  +R upper lip discomfort, swelling  Respiratory--pos for shortness of breath, wheeze, cough  Cardiovascular--negative for chest pain, palpitations, syncope. +orthopnea  Gastrointestinal--negative for abdominal pain, n/v/ d, bloody stool  +heartburn, on pantoprazole  +constipation  Heme  --pos for easy bruising, neg for bleeding  Skin/Breast--+rash  Psych--negative for depressed mood, anxiety, substance abuse, SI  Endo:  --negative for polyuria/polydispsia  --negative for heat/cold intolerance  MSK--negative for myalgias  +R knee pain x couple weeks  Neuro--negative for chronic h/a's, dizziness, falls, weakness, numbness       Patient's medications, allergies, past medical, surgical, social and/or family histories were reviewed with the patient and updated/populated in eRecord.     Current Outpatient Prescriptions   Medication Sig    SPIRIVA RESPIMAT 2.5 MCG/ACT inhaler INHALE  1 PUFF INTO THE LUNGS ONCE DAILY    budesonide-formoterol (SYMBICORT) 160-4.5 MCG/ACT inhaler Inhale 2 puffs into the lungs 2 times daily   Shake well before each use.    ipratropium-albuterol (DUONEB) 0.5-2.5 MG/3ML nebulizer  solution Take 3 mLs by nebulization every 6 hours    sertraline (ZOLOFT) 50 MG tablet TAKE 1 TABLET BY MOUTH EVERY DAY    montelukast (SINGULAIR) 10 MG tablet Take 1 tablet (10 mg total) by mouth nightly    pantoprazole (PROTONIX) 20 MG EC tablet Take 1 tablet (20 mg total) by mouth daily   Swallow whole. Do not crush, break, or chew.    Misc. Devices (CPAP) machine Download and compliance report    albuterol HFA 108 (90 BASE) MCG/ACT inhaler Inhale 2 puffs into the lungs every 4-6 hours as needed    atorvastatin (LIPITOR) 40 MG tablet Take 40 mg by mouth daily (with dinner)    Cholecalciferol (VITAMIN D3) 5000 UNITS TABS Take 5,000 Units by mouth every morning       aspirin 81 MG tablet Take 81 mg by mouth every morning       loratadine (KLS ALLERCLEAR) 10 MG tablet Take 20 mg by mouth every morning       docusate sodium (COLACE) 100 MG capsule Take 200 mg by mouth every morning       clopidogrel (PLAVIX) 75 MG tablet Take 75 mg by mouth every morning       predniSONE (DELTASONE) 10 MG tablet Take 3 pills daily for 4 days, then 2 pills daily for 4 days, then 1 pill daily for 4 days    ipratropium (ATROVENT) 0.06 % nasal spray 2 sprays by Each Nare route 3 times daily    sodium chloride (OCEAN) 0.65 % nasal spray 1 spray by Each Nare route as needed for Congestion    fluticasone (FLONASE) 50 MCG/ACT nasal spray INHALE 2 SPRAYS INTO EACH NOSTRIL EVERY DAY    azelastine (ASTELIN) 0.1 % nasal spray 1 spray by Nasal route 2 times daily    Sodium Chloride-Sodium Bicarb (AYR SALINE NASAL NETI RINSE) 1.57 G PACK 1 packet by Nasal route daily         OBJECTIVE    Last Filed Vitals    04/14/17 0944   BP: 124/82   Pulse: 76   Resp: 18   Temp: 36.3 C (97.4 F)   SpO2: 97%     Body mass index is 42.77 kg/m.      General: well-appearing morbidly obese Caucasian female, pleasant & conversant, in NAD  Eyes: PERRLA. EOMI. Conjunctiva pink, without swelling or exudate. Lids with R>L dermatochalasis.   ENT: Moist  mucous membranes.  Faint single papule at R upper lip near midline/philtrum. No ulceration or exophytic changes. No underlying inner lip abnormalities or dental abnormalities. B/L TMs grey, without bulging or erythema. R postauricular skin has thickened erythema throughout the crease, without overlying scale or focal lesion.   Neck: Trachea midline. Thyroid nontender, without nodularity or enlargement.   Lymph: No cervical or supraclavicular lymphadenopathy.  Lungs: Normal resp effort. CTAB.  No crackles or wheezes.  Cardiac: Systolic murmur, loudest at RUSB. RRR, no R/G.   Psych: AAOx3, normal affect and mood. Insight and judgement intact.   MSK: b/l crepitus with b/l knee flexion or extension. No joint line tenderness or effusion or ligamentous instability.     PHQ: 2  GAD: 3      ASSESSMENT & PLAN  1. Pain in right knee  Suspect arthritis, will get xray to confirm. Recommended NSAIDs and stretches recommended and handout provided. If this does not offer relief, I will refer to PT or consider steroid injection.  - * Knee RIGHT standard AP, Lateral, Patellar views; Future    2. Asthma  Controlled, continue current regimen. Reviewed if increasing albuterol use >2x weekly , f/u with [place] for step up in controlled meds.     3. Gastroesophageal reflux disease, esophagitis presence not specified  Discussed risks of long term use of PPIs. Will start to transition to H2 antag and wean and eventually d/c pantoprazole today, instructions provided. Advised pt not to d/c immediately.  - Comprehensive metabolic panel; Future  - CBC; Future  - Vitamin B12; Future  - Magnesium; Future    4. Coronary artery disease, angina presence unspecified, unspecified vessel or lesion type, unspecified whether native or transplanted heart  Continue management per cardiology.   - Comprehensive metabolic panel; Future  - Hemoglobin A1c; Future  - Lipid add Rfx to Drt LDL if Trig >400; Future    5. Pulmonary nodule  Repeat CT ordered today. Will  continue to monitor.   - CT chest without contrast; Future    6. Lip lesion  Reassurance provided no evidence of exophytic change, ulceration, or other signs concerning for malignancy or infection. Sx may be numbness from orbital fracture. Will monitor papule on lip. Recommended hydrating lips with vaseline or chapstick. F/u 61mo    7. Healthcare maintenance  - Hepatitis C antibody; Future            Anticipatory/preventative guidance  Sleep: OSA on CPAP  Routine sunscreen use advised/ skin cancer prevention discussed: Y, discussed ABCDEs of moles. Will monitor lip lesion.  Dentist: recommended  Eye exam: UTD, wearing glasses  Mammogram/breast self exam (females): UTD, due 03/2018  Colonoscopy/colon cancer screening: UTD, due 03/2018  STD risk screening, counseling, and prevention as appropriate, safe sex/contraception as appropriate: HIV test offered and declined today.  Depression screening evaluation performed today: Y  Recent Review Flowsheet Data     PHQ-2/9 Scores 04/14/2017 09/20/2016 03/21/2016 04/10/2015    PSQ2 Q1 - Interest/Pleasure N Y - Y    PSQ2 Q2 - Down, Depressed, Hopeless N N - Y    PHQ9 Q9 - Better Off Dead - 0 0 0    PHQ9 Calculated Score - 4 2 4           Cardiac risk factor modification discussed: Y, high risk, continue medical management and following by cardiology. Labs ordered today.  Diabetes prevention and screening discussed: Y. Labs ordered today.      Follow up in 6 months, return to clinic sooner prn.      [name], MD    I Luellen Pucker, am scribing for and in the presence of Dr. [name]. 04/14/2017 9:50 AM.    I, Dr. [name], MD, personally performed the services described in this documentation, as scribed by Luellen Pucker in my presence, and it is accurate and complete.  I have reviewed, added to, and completed this documentation personally and assure that it is complete and accurate. 04/14/2017 5:11 PM

## 2017-04-14 NOTE — Patient Instructions (Addendum)
Today we talked about your heartburn and the risks of long term use of "PPI's" (omeprazole, pantoprazole, esomeprazole).    THere is a safer medication to use every day for months/years - zantac (ranitidine), pepcid (famotidine).    To get you off the PPI we need to do this SLOWLY. If you stop it cold [place] you will have bad heartburn symptoms. Here is a schedule of how to lower the PPI and incorporate the new medicine:    Starting tomorrow, for 2 weeks: Take Rantidine/pepcid twice daily and continue your PPI.  In 2 weeks, continue rantidine/pepcid twice daily, wean your PPI to every other day. Do this for 2 weeks total.  Then continue rantidine/pepcid twice daily, take PPI 2 times a week for 2 weeks.  Then continue rantidine/pepcid, stop PPI.    If anytime during this time you feel bad heartburn, continue your ranitidine/pepcid and take an extra PPI.     [facility]  [phone]  CT  [place], [place] [address]  04/30/17 at 9:40 am  Nothing to eat or drink 2 hours before your exam

## 2017-04-15 ENCOUNTER — Telehealth: Payer: Self-pay | Admitting: Gastroenterology

## 2017-04-15 LAB — HEPATITIS C ANTIBODY: Hep C Ab: NEGATIVE

## 2017-04-15 LAB — UNMAPPED LAB RESULTS: Hep C Ab: NEGATIVE — NL

## 2017-04-15 NOTE — Telephone Encounter (Signed)
[place] fax over a release for records. Release will be place in Dr.Eisenberg's office to be sign off. Thank You.

## 2017-04-16 ENCOUNTER — Telehealth: Payer: Self-pay | Admitting: Primary Care

## 2017-04-16 DIAGNOSIS — M25561 Pain in right knee: Secondary | ICD-10-CM

## 2017-04-16 MED ORDER — VITAMIN B-12 1000 MCG PO TABS *I*
1000.0000 ug | ORAL_TABLET | Freq: Every day | ORAL | 3 refills | Status: AC
Start: 2017-04-16 — End: 2017-10-13

## 2017-04-16 NOTE — Telephone Encounter (Signed)
Please call the pt and let her know that her xray didn't show too bad of arthritis, but I suspect that is still what's causing her pain. Treatment can be physical therapy or seeing a specialist to see if a steroid shot would be helpful.  Please ask if she'd like that referral    Her bloodwork shows low vitamin b12, I sent in script for her to take to increase this. Cholesterol controlled, sugar is improved, liver kidneys ok    [name], MD  [facility]  04/16/2017  4:48 PM

## 2017-04-16 NOTE — Telephone Encounter (Signed)
Let's refer to PT and if pain comes back after that we can send to orthopedic specialist.  pelase offer her PT office downstairs, or external if she has one closer to her home    [name], MD  [place]  04/16/2017  5:21 PM

## 2017-04-16 NOTE — Telephone Encounter (Signed)
Called and spoke to patient and gave her the message below. Patient verbalized standing. She said that she would like a referral but as of right now her pain is controlled.

## 2017-04-17 NOTE — Telephone Encounter (Signed)
I spoke to the patient. She wants to go to PT downstairs.

## 2017-04-18 NOTE — Telephone Encounter (Signed)
Referral done, did you given her that info?    [name], MD  [facility]  04/18/2017  12:27 PM

## 2017-04-21 NOTE — Telephone Encounter (Signed)
Left a message fore patient to contact the office back to give her the scheduling information for PT down stairs, please relay message below.    [employer] [employer] [facility] [employer]  [place] [id number]  [phone]

## 2017-04-30 ENCOUNTER — Ambulatory Visit
Admission: RE | Admit: 2017-04-30 | Discharge: 2017-04-30 | Disposition: A | Discharge status: Home / Self Care | Payer: Medicare (Managed Care) | Source: Ambulatory Visit | Attending: Radiology | Admitting: Radiology

## 2017-04-30 DIAGNOSIS — R911 Solitary pulmonary nodule: Secondary | ICD-10-CM | POA: Insufficient documentation

## 2017-06-04 ENCOUNTER — Other Ambulatory Visit: Payer: Self-pay

## 2017-06-04 ENCOUNTER — Ambulatory Visit: Payer: Self-pay | Admitting: Pulmonology

## 2017-06-09 ENCOUNTER — Ambulatory Visit: Payer: Medicare (Managed Care) | Admitting: Pulmonology

## 2017-06-09 ENCOUNTER — Other Ambulatory Visit: Payer: Medicare (Managed Care)

## 2017-06-12 ENCOUNTER — Encounter: Payer: Self-pay | Admitting: Primary Care

## 2017-06-12 DIAGNOSIS — F32A Depression, unspecified: Secondary | ICD-10-CM | POA: Insufficient documentation

## 2017-06-12 DIAGNOSIS — F419 Anxiety disorder, unspecified: Secondary | ICD-10-CM | POA: Insufficient documentation

## 2017-06-13 NOTE — Progress Notes (Signed)
[facility]      Dear Dr. [name], MD,    I had the pleasure of seeing [name] on 06/13/2017 at the [facility] for  re-evaluation of dyspnea in the setting of asthma, OSA, and morbid obesity.  Please allow me to review the past medical history for my records.    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 At that time, she was continued on Symbicort, Singulair, albuterol and CPAP.  Spiriva HandiHaler was changed to Respimat. CPAP download was obtained which showed compliance.       Since her last visit she has been doing well from a pulmonary standpoint.  She did have an upper respiratory infection which started in her sinus'.  She did not require antibiotics or prednisone.  She used OTC Mucinex and felt better after several weeks.  She has been noticing some difficulty swallowing over the last month.  She has had esophageal dilation approximately 15 years ago. She has some dyspnea with exertion.  She only uses albuterol about twice a week at most.  She continues weight loss efforts with  Weight watchers.  She states she has lost a total of 50 lbs, but has gained some back over the holiday's.     On review of systems, she denies recent fevers, chills, night sweats, weight loss; she denies resting or dyspnea, exertional or pleuritic chest pain, wheezing, chest tightness, orthopnea; cough hemoptysis, cyanosis; she denies nocturnal respiratory symptoms including paroxysmal dyspnea, cough, or wheezing. The rest of the review of systems is  negative.    A complete medication list was reviewed and reconciled.    Pertinent pulmonary medications include: Albuterol, Symbicort, Spiriva, ipratropium nasal spray, Flonase, ranitidine, montelukast, CPAP    Allergic reactions were reviewed and updated.       PHYSICAL EXAM & DATA     Vitals:    06/16/17 1351   BP: 158/81   BP Location: Right arm   Patient Position: Sitting   Cuff Size: adult   Pulse: 76   Resp: 24   Temp: 35.7 C (96.3 F)   SpO2: 96%   Weight: 122.2 kg (269 lb 6.4 oz)   Height: 1.679 m (5' 6.1")       Gen: NAD, speaking in full sentences.  HEENT: MMM, patent nares with non-edematous turbinates and no rhinorrhea, no oropharyngeal lesions, a non-deviated trachea, no stridor, and no cervical or supraclavicular lymphadenopathy.    Resp: No increased WOB, CTAB without crackles or wheezes.    Card: S1S2 without murmurs, normal jugular venous pressure, and no pedal edema.    Abd: Soft, NT, ND, normoactive BS  MSK: No cyanosis, clubbing, or joint tenderness.    Neuro: No focal motor or sensory defects.    Integument: No significant lesions or rashes.       Pulmonary function tests:  Spirometry today reveals an FEV1 of 1.81 liters (74% of predicted),  an FVC of 2.80 liters (86 % of predicted) and a reduced FEV1/FVC ratio of 65 %. These values are consistent with mild obstructive lung disease.  The values have increased.  The FEV1 and FVC increased by 16% and 11%  respectively.    Imaging studies since our last visit:  04/30/17 CT Chest  Stable 6 mm left lung nodule.  New findings since the prior exam    Impression:  [name] is a 72 y.o. female  with dyspnea/asthma, morbid obesity, and OSA. She is doing very well from a pulmonary standpoint.  Spirometry is stable and she has been compliant with her CPAP.  She should continue her current inhalers.  I have encouraged her to call her gastroenterologist to further evaluate her swallow difficulty.  She should continue to make efforts to lose  weight.      My recommendations are:   -Continue Symbicort   -Continue Spiriva Respimat   -Continue albuterol as needed   -Continue CPAP  - Follow up with GI    -Follow up in 1 year with spirometry.  She knows to contact us sooner if needed.     Thank you for allowing me to remain involved in the care of [name]. Please don't hesitate to contact me with any questions or concerns.     Sincerely,   Serafina Mitchell.Araceli Arango, NP  2:32 PM  06/16/2017

## 2017-06-16 ENCOUNTER — Encounter: Payer: Self-pay | Admitting: Pulmonology

## 2017-06-16 ENCOUNTER — Ambulatory Visit: Payer: Medicare (Managed Care) | Attending: Pulmonology | Admitting: Pulmonology

## 2017-06-16 ENCOUNTER — Ambulatory Visit: Payer: Medicare (Managed Care) | Attending: Primary Care

## 2017-06-16 VITALS — BP 158/81 | HR 76 | Temp 96.3°F | Resp 24 | Ht 66.1 in | Wt 269.4 lb

## 2017-06-16 DIAGNOSIS — R1310 Dysphagia, unspecified: Secondary | ICD-10-CM | POA: Insufficient documentation

## 2017-06-16 DIAGNOSIS — J449 Chronic obstructive pulmonary disease, unspecified: Secondary | ICD-10-CM

## 2017-06-16 DIAGNOSIS — R0600 Dyspnea, unspecified: Secondary | ICD-10-CM | POA: Insufficient documentation

## 2017-06-16 MED ORDER — TIOTROPIUM BROMIDE MONOHYDRATE 2.5 MCG/ACT IN AERS *I*
1.0000 | INHALATION_SPRAY | Freq: Every day | RESPIRATORY_TRACT | 11 refills | Status: DC
Start: 2017-06-16 — End: 2018-08-05

## 2017-06-16 NOTE — Patient Instructions (Signed)
Continue current inhalers  Follow up with GI about your swallowing.

## 2017-06-27 ENCOUNTER — Encounter: Payer: Self-pay | Admitting: Gastroenterology

## 2017-07-16 ENCOUNTER — Encounter: Payer: Self-pay | Admitting: Primary Care

## 2017-07-16 ENCOUNTER — Ambulatory Visit: Payer: Medicare (Managed Care) | Attending: Primary Care | Admitting: Primary Care

## 2017-07-16 VITALS — BP 120/76 | HR 80 | Temp 95.1°F | Ht 66.1 in | Wt 270.2 lb

## 2017-07-16 DIAGNOSIS — R7303 Prediabetes: Secondary | ICD-10-CM

## 2017-07-16 DIAGNOSIS — M5430 Sciatica, unspecified side: Secondary | ICD-10-CM

## 2017-07-16 DIAGNOSIS — K219 Gastro-esophageal reflux disease without esophagitis: Secondary | ICD-10-CM

## 2017-07-16 DIAGNOSIS — E538 Deficiency of other specified B group vitamins: Secondary | ICD-10-CM

## 2017-07-16 DIAGNOSIS — R200 Anesthesia of skin: Secondary | ICD-10-CM

## 2017-07-16 LAB — PCMH FALL RISK PLAN

## 2017-07-16 LAB — PCMH FALL RISK ASSESSMENT

## 2017-07-16 NOTE — Progress Notes (Signed)
[facility]    SUBJECTIVE    Pt is here to discuss:    Chief Complaint   Patient presents with    Gastrophageal Reflux    Hip Pain     left     1. GERD - pt has successfully transitioned from pantoprazole to ranitidine BID with no breakthrough heartburn. She says "it took a while and I didn't think it would work at first."    2. L gluteal pain - radiates down posterior leg to calf and foot. Started 6d ago. Gradually improving. Exacerbated by sitting on hard surfaces. Pt says she could "hardly walk," and her husband says she was limping. She took Tylenol for relief.     3. Facial discomfort - pt says her nose "always feels like it's dripping, but it's not." She also has chronic R upper lip discomfort, "feels different" than the rest of her lips. She fractured her face in 2017.       PMH / Family Hx / Social Hx  Patient's medications, allergies, problem list, past medical, social histories were reviewed and notable for:    Current Outpatient Prescriptions   Medication Sig    tiotropium (SPIRIVA RESPIMAT) 2.5 MCG/ACT inhaler Inhale 1 puff into the lungs daily    cyanocobalamin (VITAMIN B-12) 1000 MCG tablet Take 1 tablet (1,000 mcg total) by mouth daily    ranitidine (ZANTAC) 150 MG tablet Take 1 tablet (150 mg total) by mouth 2 times daily    budesonide-formoterol (SYMBICORT) 160-4.5 MCG/ACT inhaler Inhale 2 puffs into the lungs 2 times daily   Shake well before each use.    ipratropium-albuterol (DUONEB) 0.5-2.5 MG/3ML nebulizer solution Take 3 mLs by nebulization every 6 hours    sertraline (ZOLOFT) 50 MG tablet TAKE 1 TABLET BY MOUTH EVERY DAY    montelukast (SINGULAIR) 10 MG tablet Take 1 tablet (10 mg total) by mouth nightly    ipratropium (ATROVENT) 0.06 % nasal spray 2 sprays by Each Nare route 3 times daily    Misc. Devices (CPAP) machine Download and compliance report    albuterol HFA 108 (90 BASE) MCG/ACT inhaler Inhale 2 puffs into the lungs every 4-6 hours as needed    sodium  chloride (OCEAN) 0.65 % nasal spray 1 spray by Each Nare route as needed for Congestion    fluticasone (FLONASE) 50 MCG/ACT nasal spray INHALE 2 SPRAYS INTO EACH NOSTRIL EVERY DAY    atorvastatin (LIPITOR) 40 MG tablet Take 40 mg by mouth daily (with dinner)    azelastine (ASTELIN) 0.1 % nasal spray 1 spray by Nasal route 2 times daily    Sodium Chloride-Sodium Bicarb (AYR SALINE NASAL NETI RINSE) 1.57 G PACK 1 packet by Nasal route daily    Cholecalciferol (VITAMIN D3) 5000 UNITS TABS Take 5,000 Units by mouth every morning       aspirin 81 MG tablet Take 81 mg by mouth every morning       loratadine (KLS ALLERCLEAR) 10 MG tablet Take 20 mg by mouth every morning       docusate sodium (COLACE) 100 MG capsule Take 200 mg by mouth every morning       clopidogrel (PLAVIX) 75 MG tablet Take 75 mg by mouth every morning          ROS  No h/o sciatica.   No back pain.   No lifting injury.  No paresthesias, weakness, foot drop.   R knee pain has resolved.      OBJECTIVE  Vitals:    07/16/17 1012   BP: 120/76   Pulse: 80   Temp: 35.1 C (95.1 F)   TempSrc: Temporal   Weight: 122.6 kg (270 lb 3.2 oz)   Height: 1.679 m (5' 6.1")     Body mass index is 43.48 kg/m.      General: well-appearing morbidly obese Caucasian female, pleasant & conversant, in NAD. Here with husband.   Psych: AAOx3, normal affect and mood. Insight and judgement intact.   MSK: no spinal process tenderness. No paravertebral spinal muscle spasms or tenderness. SI notch on L non tender. Negative seated and supine SLR. Negative FABER. Mild tenderness of the L GT. Ischial tuberosity non tender.       ASSESSMENT & PLAN    1. Sciatica  Recommended daily stretches, handout provided.  Advised pt to let me know if pain worsens, if she feels a "pop" in her back, if she experiences weakness, or bowel/bladder incontinence as these are signs of herniated discs.    2. GERD (gastroesophageal reflux disease)  3. B12 deficiency  Pt tolerated the transition from  pantoprazole to ranitidine without breakthrough heartburn.   Labs ordered as below for pt to complete before next visit.  Adherent with b12 supplement. Will update labs prior to next visit and consider d/c of b12 now that she's off PPI, if levels have normalized.   - Vitamin B12; Future  - Comprehensive metabolic panel; Future      4. Prediabetes  Labs ordered as below for pt to complete before next visit.   - Hemoglobin A1c; Future    5. Facial numbness  Likely nerve injury from fracture. Reviewed avail of neuropathic meds (gabapentin or changing SSRI to SNRI). Pt will tolerate sx for time being.     Reminded pt to complete Shingrix when available.     HIV testing offered and declined today.        Follow-up: 6 months for CPE or sooner PRN      [name], MD  [facility]  07/16/2017  10:34 AM      I Luellen Pucker, am scribing for and in the presence of Dr. [name]. 07/16/2017 10:34 AM.    I, Dr. [name], MD, personally performed the services described in this documentation, as scribed by Luellen Pucker in my presence, and it is accurate and complete.  I have reviewed, added to, and completed this documentation personally and assure that it is complete and accurate. 07/16/2017 11:04 AM        ______________________

## 2017-08-08 ENCOUNTER — Other Ambulatory Visit: Payer: Self-pay | Admitting: Pulmonology

## 2017-08-08 ENCOUNTER — Telehealth: Payer: Self-pay

## 2017-08-08 DIAGNOSIS — R0600 Dyspnea, unspecified: Secondary | ICD-10-CM

## 2017-08-08 MED ORDER — MONTELUKAST SODIUM 10 MG PO TABS *I*
10.0000 mg | ORAL_TABLET | Freq: Every evening | ORAL | 6 refills | Status: DC
Start: 2017-08-08 — End: 2018-03-07

## 2017-08-08 NOTE — Telephone Encounter (Signed)
Patient came to the office this afternoon and stated that Dr.Moore wrote a jury letter back in January and stated to come to the office and we can fax it for the patient. The patient was informed that we usually do not fax jury letters, as we have been told Regulatory affairs officer confirmed with office manager as well) that the jury office does not accept it from [place]. The patient was informed, but stated that she was told to fax it directly to the jury office. Patient completed a release of information in the office and writer printed off the letter and fax to the jury office. The release was placed into scanning.

## 2017-08-19 ENCOUNTER — Other Ambulatory Visit: Payer: Self-pay | Admitting: Primary Care

## 2017-08-19 DIAGNOSIS — F419 Anxiety disorder, unspecified: Secondary | ICD-10-CM

## 2017-08-20 MED ORDER — SERTRALINE HCL 50 MG PO TABS *I*
50.0000 mg | ORAL_TABLET | Freq: Every day | ORAL | 3 refills | Status: DC
Start: 2017-08-20 — End: 2018-08-26

## 2017-08-23 ENCOUNTER — Encounter: Payer: Self-pay | Admitting: Family

## 2017-08-23 ENCOUNTER — Ambulatory Visit
Admission: AD | Admit: 2017-08-23 | Discharge: 2017-08-23 | Disposition: A | Discharge status: Home / Self Care | Payer: Medicare (Managed Care) | Source: Ambulatory Visit | Attending: Emergency Medicine | Admitting: Emergency Medicine

## 2017-08-23 DIAGNOSIS — J0100 Acute maxillary sinusitis, unspecified: Secondary | ICD-10-CM | POA: Insufficient documentation

## 2017-08-23 DIAGNOSIS — J45901 Unspecified asthma with (acute) exacerbation: Secondary | ICD-10-CM | POA: Insufficient documentation

## 2017-08-23 MED ORDER — PREDNISONE 10 MG PO TABS *I*
40.0000 mg | ORAL_TABLET | Freq: Once | ORAL | Status: AC
Start: 2017-08-23 — End: 2017-08-23
  Administered 2017-08-23: 40 mg via ORAL

## 2017-08-23 MED ORDER — ALBUTEROL SULFATE (2.5 MG/3ML) 0.083% IN NEBU *I*
2.5000 mg | INHALATION_SOLUTION | Freq: Once | RESPIRATORY_TRACT | Status: AC
Start: 2017-08-23 — End: 2017-08-23
  Administered 2017-08-23: 2.5 mg via RESPIRATORY_TRACT

## 2017-08-23 MED ORDER — PREDNISONE 20 MG PO TABS *I*
40.0000 mg | ORAL_TABLET | Freq: Every day | ORAL | 0 refills | Status: AC
Start: 2017-08-24 — End: 2017-08-28

## 2017-08-23 MED ORDER — ALBUTEROL SULFATE (2.5 MG/3ML) 0.083% IN NEBU *I*
2.5000 mg | INHALATION_SOLUTION | Freq: Four times a day (QID) | RESPIRATORY_TRACT | 0 refills | Status: AC | PRN
Start: 2017-08-23 — End: 2017-09-06

## 2017-08-23 MED ORDER — IPRATROPIUM BROMIDE 0.02 % IN SOLN *I*
500.0000 ug | Freq: Once | RESPIRATORY_TRACT | Status: AC
Start: 2017-08-23 — End: 2017-08-23
  Administered 2017-08-23: 500 ug via RESPIRATORY_TRACT

## 2017-08-23 MED ORDER — DOXYCYCLINE MONOHYDRATE 100 MG PO CAPS *I*
100.0000 mg | ORAL_CAPSULE | Freq: Two times a day (BID) | ORAL | 0 refills | Status: AC
Start: 2017-08-23 — End: 2017-08-30

## 2017-08-23 NOTE — UC Provider Note (Addendum)
History     Chief Complaint   Patient presents with    Cough     Pt comes in with productive cough x1 week. Pt ha sbeen using her inhaler w/o relief. no fevers. Denies any congestion. Says she has seasonall allerges as well. No cough medications. Pt had last nebulizer treatment this am.      72 year old female with a history of asthma, CAD, OSA presents with 1 week of increasing productive cough, wheezing and shortness of breath.  Patient thought she was started feeling better yesterday but then this morning increased wheezing and shortness of breath.  Last nebulizer treatment was earlier this morning.  She denies any fever, nausea/vomiting, diarrhea or known sick contacts or recent travel.            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, on anticoagulation for ~2 years    PE (pulmonary embolism)     post-op after CABG, on anticoagulation for ~2 years    Prediabetes     Tubular adenoma 03/2015    colonoscopy         Patient Active Problem List   Diagnosis Code    Dyspnea on exertion R06.09    Asthma J45.909    Severe Obstructive sleep apnea G47.33    Pulmonary nodule R91.1    Allergic rhinitis J30.9    CAD (coronary artery disease) I25.10    Prediabetes R73.03    Gastritis K29.70    Obesity E66.9    Dysthymia F34.1    Facial fracture due to fall S02.92XA, [id number]    Orbital floor fracture S02.30XA    Restless leg syndrome G25.81    Health care maintenance Z00.00    Aortic stenosis I35.0    Anxiety and depression F41.9, F32.9            Past Surgical History:   Procedure Laterality Date    ACHILLES TENDON SURGERY      APPENDECTOMY      CHOLECYSTECTOMY      CORONARY ANGIOPLASTY WITH STENT PLACEMENT      CORONARY ARTERY BYPASS GRAFT  2013    triple     CYST REMOVAL      both thumbs    PR REPAIR EYE BLOWOUT,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     Procedure: ORBIT ORIF;  Surgeon: Jannett Celestine, DDS;  Location: [facility] MAIN OR;  Service: OMFS    TONSILLECTOMY AND ADENOIDECTOMY       Family History   Problem Relation Age of Onset    Other Mother         "rare lung disease"    Aneurysm Mother         abdominal aortic aneurysm    Diabetes Type 2 Mother     Breast cancer Mother     Blood clots Mother     Stroke Father     Stroke Brother         x2    Thyroid cancer Daughter           Social History   Substance Use Topics    Smoking status: Never Smoker    Smokeless tobacco: Never Used    Alcohol use No     Living Situation     Questions Responses    Patient lives  with Spouse    Homeless No    Caregiver for other family member No    External Services None    Employment Retired    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   HENT: Positive for postnasal drip. Negative for congestion and sore throat.    Eyes: Negative for pain and discharge.   Respiratory: Positive for cough, shortness of breath and wheezing. Negative for chest tightness.    Cardiovascular: Negative for chest pain.   Gastrointestinal: Negative for abdominal pain, diarrhea, nausea and vomiting.   Musculoskeletal: Negative for arthralgias and myalgias.   Skin: Negative for rash.       Physical Exam   Triage Vitals  Triage Start: Start, (08/23/17 5009)   First Recorded BP: 139/77, Resp: 18, Temp: 36 C (96.8 F), Temp src: TEMPORAL Oxygen Therapy SpO2: 96 %, Oximetry Source: Rt Hand, O2 Device: None (Room air), Heart Rate: 84, (08/23/17 0943)  .      Physical Exam   Constitutional: She appears well-developed and well-nourished.   HENT:   Head: Normocephalic and atraumatic.   Right Ear: Hearing, tympanic membrane, external ear and ear canal normal.   Left Ear: Hearing, tympanic membrane, external ear and ear canal normal.   Nose: Mucosal edema (mild) and rhinorrhea present. No nasal deformity.    Mouth/Throat: Oropharynx is clear and moist and mucous membranes are normal. No oral lesions. No uvula swelling. No oropharyngeal exudate, posterior oropharyngeal edema or posterior oropharyngeal erythema.   Eyes: Conjunctivae and EOM are normal.   Neck: Normal range of motion. Neck supple.   Cardiovascular: Normal rate and regular rhythm.  Exam reveals no gallop and no friction rub.    No murmur heard.  Pulmonary/Chest: Effort normal. She has wheezes (chest diffuse expiratory wheeze). She has no rales.   Lymphadenopathy:     She has no cervical adenopathy.   Neurological: She is alert.   Skin: Skin is warm and dry.   Psychiatric: She has a normal mood and affect. Her behavior is normal.   Nursing note and vitals reviewed.     Breath sound globally improved after nebulizer treatment.  Patient reports symptomatic improvement.      Medical Decision Making        Initial Evaluation:  ED First Provider Contact     Date/Time Event User Comments    08/23/17 [phone] ED First Provider Contact Wylene Weissman Initial Face to Face Provider Contact          Patient was seen on: 08/23/2017        Assessment:  72 y.o.female comes to the [facility] with URI symptoms for the past week with increasing cough, wheezing and shortness breath likely viral URI with asthma exacerbation.    Differential Diagnosis includes:  Viral Bronchitis  Nasopharyngitis   Rhinosinusitis  Viral URI  CAP  Asthma exacerbation      Plan:   Orders Placed This Encounter    albuterol (PROVENTIL) nebulization 2.5 mg    ipratropium (ATROVENT) 0.02 % nebulizer solution 500 mcg    predniSONE (DELTASONE) tablet 40 mg    doxycycline monohydrate (MONODOX) 100 MG capsule    predniSONE (DELTASONE) 20 MG tablet    albuterol (PROVENTIL) (2.5 mg/69mL) 0.083% nebulizer solution       No results found for this or any  previous visit (from the past 24 hour(s)).      Final Diagnosis    ICD-10-CM ICD-9-CM   1. Acute non-recurrent maxillary sinusitis J01.00 461.0    2. Exacerbation of asthma, unspecified asthma severity, unspecified whether persistent J45.901 493.92       Encourage fluids, encourage rest, good hand hygiene.    Use over the counter medications as discussed in discharge paperwork.    Please start the new medications as below:    Current Discharge Medication List      New Medications    Details Last Dose Given Next Dose Due Script Given?   doxycycline monohydrate (MONODOX) 100 mg Dose: 100 mg  Take 100 mg by mouth 2 times daily  Quantity 14 capsule, Refill 0  Start date: 08/23/2017, End date: 08/30/2017       Comments: Emergency Encounter            predniSONE (DELTASONE) 40 mg Dose: 40 mg  Take 40 mg by mouth daily    Quantity 8 tablet, Refill 0  Start date: 08/24/2017, End date: 08/28/2017            albuterol (PROVENTIL) 2.5 mg Dose: 2.5 mg  Take 2.5 mg by nebulization every 6 hours as needed for Wheezing    Quantity 75 mL, Refill 0  Start date: 08/23/2017, End date: 09/06/2017       Comments: Emergency Encounter                   Side effects and proper use of medications discussed.      If your condition changes and/or worsens please follow up with your primary doctor and/or return to the urgent care center.    If short of breath, chest pains or any other concerns please report to the emergency room.    In the event of an Emergency dial 911.           Final Diagnosis  Final diagnoses:   [J01.00] Acute non-recurrent maxillary sinusitis (Primary)   [J45.901] Exacerbation of asthma, unspecified asthma severity, unspecified whether persistent         Joseph Pierini, PA              Joseph Pierini, [place]  08/23/17 [place], [place], [place]  08/23/17 1034

## 2017-08-23 NOTE — ED Triage Notes (Signed)
Pt comes in with productive cough x1 week. Pt ha sbeen using her inhaler w/o relief. no fevers. Denies any congestion. Says she has seasonall allerges as well. No cough medications. Pt had last nebulizer treatment this am.        Triage Note   Franne Forts, RN

## 2017-08-26 ENCOUNTER — Ambulatory Visit
Payer: Medicare (Managed Care) | Attending: Pulmonary and Critical Care Medicine | Admitting: Pulmonary and Critical Care Medicine

## 2017-08-26 ENCOUNTER — Encounter: Payer: Self-pay | Admitting: Pulmonary and Critical Care Medicine

## 2017-08-26 VITALS — BP 118/72 | HR 71 | Temp 97.0°F | Ht 66.1 in | Wt 266.0 lb

## 2017-08-26 DIAGNOSIS — J45901 Unspecified asthma with (acute) exacerbation: Secondary | ICD-10-CM

## 2017-08-26 MED ORDER — IPRATROPIUM-ALBUTEROL 0.5-2.5 MG/3ML IN SOLN *I*
3.0000 mL | Freq: Four times a day (QID) | RESPIRATORY_TRACT | 1 refills | Status: AC
Start: 2017-08-26 — End: ?

## 2017-08-26 NOTE — Progress Notes (Signed)
[facility] - Outpatient Progress Note    SUBJECTIVE    [name] is a 72 y.o. female who presents, for Asthma    1. Asthma exacerbation: on Sat went to UC and was started on prednisone-4 days course 40 mg daily currently on day 3, also received Doxy x 7 daysfor asthma exacerbation and she is currently on 3/7 day  -her s/s are not improving as usual, still has wheezing on/off-worse at night, cough, feeling congested  -is using her controller medication as Rx'ed  -needs her albuterol once daily now, last tx right before coming here  -no fever/chills, not worse but also not much better  -no chest x-ray completed  -is going to [place] on Thurs and would like this to be gone      Patient's medications were reviewed and reconciled with the patient at visit today.        ROS: see HPI above    Current Outpatient Prescriptions   Medication Sig    doxycycline monohydrate (MONODOX) 100 MG capsule Take 1 capsule (100 mg total) by mouth 2 times daily for 7 days    predniSONE (DELTASONE) 20 MG tablet Take 2 tablets (40 mg total) by mouth daily    albuterol (PROVENTIL) (2.5 mg/41mL) 0.083% nebulizer solution Take 3 mLs (2.5 mg total) by nebulization every 6 hours as needed for Wheezing    sertraline (ZOLOFT) 50 MG tablet Take 1 tablet (50 mg total) by mouth daily    montelukast (SINGULAIR) 10 MG tablet Take 1 tablet (10 mg total) by mouth nightly    tiotropium (SPIRIVA RESPIMAT) 2.5 MCG/ACT inhaler Inhale 1 puff into the lungs daily    cyanocobalamin (VITAMIN B-12) 1000 MCG tablet Take 1 tablet (1,000 mcg total) by mouth daily    ranitidine (ZANTAC) 150 MG tablet Take 1 tablet (150 mg total) by mouth 2 times daily    budesonide-formoterol (SYMBICORT) 160-4.5 MCG/ACT inhaler Inhale 2 puffs into the lungs 2 times daily   Shake well before each use.    ipratropium-albuterol (DUONEB) 0.5-2.5 MG/3ML nebulizer solution Take 3 mLs by nebulization every 6 hours    ipratropium (ATROVENT) 0.06 % nasal spray  2 sprays by Each Nare route 3 times daily    Misc. Devices (CPAP) machine Download and compliance report    albuterol HFA 108 (90 BASE) MCG/ACT inhaler Inhale 2 puffs into the lungs every 4-6 hours as needed    sodium chloride (OCEAN) 0.65 % nasal spray 1 spray by Each Nare route as needed for Congestion    fluticasone (FLONASE) 50 MCG/ACT nasal spray INHALE 2 SPRAYS INTO EACH NOSTRIL EVERY DAY    atorvastatin (LIPITOR) 40 MG tablet Take 40 mg by mouth daily (with dinner)    azelastine (ASTELIN) 0.1 % nasal spray 1 spray by Nasal route 2 times daily    Sodium Chloride-Sodium Bicarb (AYR SALINE NASAL NETI RINSE) 1.57 G PACK 1 packet by Nasal route daily    Cholecalciferol (VITAMIN D3) 5000 UNITS TABS Take 5,000 Units by mouth every morning       aspirin 81 MG tablet Take 81 mg by mouth every morning       loratadine (KLS ALLERCLEAR) 10 MG tablet Take 20 mg by mouth every morning       docusate sodium (COLACE) 100 MG capsule Take 200 mg by mouth every morning       clopidogrel (PLAVIX) 75 MG tablet Take 75 mg by mouth every morning  OBJECTIVE  Vitals Reviewed:  BP 118/72    Pulse 71    Temp 36.1 C (97 F) (Temporal)    Ht 1.679 m (5' 6.1")    Wt 120.7 kg (266 lb)    LMP  (LMP Unknown)    SpO2 97%    BMI 42.80 kg/m     Wt Readings from Last 3 Encounters:   08/26/17 120.7 kg (266 lb)   07/16/17 122.6 kg (270 lb 3.2 oz)   06/16/17 122.2 kg (269 lb 6.4 oz)        Hemoglobin A1C   Date Value Ref Range Status   04/14/2017 5.8 (H) % Final     Comment:     Ref Range < 5.7  HbA1c values of 5.7-6.4% indicate an increased risk for developing  diabetes mellitus.  HbA1c values greater than or equal to 6.5% are diagnostic of  diabetes mellitus.  For diagnosis of diabetes in individuals without unequivocal  hyperglycemia, results should be confirmed by repeat testing.     03/21/2016 6.1 (H) 4.0 - 6.0 % Final     Comment:                                   NEAR NORMAL GLYCEMIA   09/14/2015 6.2 (H) 4.0 - 6.0 %  Final     Comment:                                   NEAR NORMAL GLYCEMIA   01/03/2015 6.4 (H) 4.0 - 6.0 % Final     Comment:                                   NEAR NORMAL GLYCEMIA   10/01/2014 6.3 (H) 4.0 - 6.0 % Final     Comment:                                   NEAR NORMAL GLYCEMIA          GENERAL: A&Ox3, pleasant, cooperative, well groomed, appropriately dressed, actively engaged in encounter, NAD.  CARDIOVASCULAR: RR, S1 S2, no murmers, gallops, rubs.  RESPIRATORY: RRR, unlabored. Breath sounds clear to auscultation bilaterally, no adventitious breath sounds, full and symmetrical lung expansion.      ASSESSMENT & PLAN    1. Acute asthma exacerbation  -stable  -continue current prednisone 40 mg daily and doxy as rx'ed  -educated on these 2 medication, will not extend prednisone today as she has not finisghed 1st round yet and will continue antibiotic x 4 more days  -continue all asthma maintenance medications as directed  -will renew Duoneb for nebulizer today  -reassurance provided and worsening s/s discussed, pt will call if they occur while out of town  -will f/u right after trip to be reevaluated  - ipratropium-albuterol (DUONEB) 0.5-2.5mg  /7mL nebulizer solution; Take 3 mLs by nebulization every 6 hours  Dispense: 90 mL; Refill: 1    F/u in 10 days, call sooner if needed    Health Maintenance    Health Maintenance   Topic Date Due    HIV TESTING OFFERED  09/26/1958    IMM-ZOSTER (1 of 2) 09/25/1996    COLON CANCER SCREENING  3 YEAR COLONOSCOPY  03/20/2018    Breast Cancer Screening Other  04/10/2018    DEPRESSION SCREEN YEARLY  04/14/2018    Fall Risk Screening  07/17/2018    IMM-INFLUENZA  Completed    IMM-PREVNAR VACCINE 65 + YRS  Completed    IMM-PNEUMOVAX VACCINE 65 + YRS  Completed    HEPATITIS C SCREENING OFFERED  Completed       Patient verbalized understanding of information presented,and confirmed agreement with current plan of care.  Reviewed risks, benefits, and administration of  prescribed/refilled medications. Precautions reviewed.    MyChart:  Activated [1]

## 2017-09-10 ENCOUNTER — Ambulatory Visit: Payer: Medicare (Managed Care) | Admitting: Pulmonary and Critical Care Medicine

## 2017-11-26 ENCOUNTER — Other Ambulatory Visit: Payer: Self-pay | Admitting: Primary Care

## 2017-11-26 DIAGNOSIS — K219 Gastro-esophageal reflux disease without esophagitis: Secondary | ICD-10-CM

## 2017-11-26 MED ORDER — RANITIDINE HCL 150 MG PO TABS *I*
150.0000 mg | ORAL_TABLET | Freq: Two times a day (BID) | ORAL | 5 refills | Status: DC
Start: 2017-11-26 — End: 2018-05-25

## 2017-12-29 ENCOUNTER — Encounter: Payer: Self-pay | Admitting: Primary Care

## 2017-12-29 ENCOUNTER — Ambulatory Visit: Payer: Medicare (Managed Care) | Admitting: Primary Care

## 2017-12-29 ENCOUNTER — Ambulatory Visit
Admission: RE | Admit: 2017-12-29 | Discharge: 2017-12-29 | Disposition: A | Discharge status: Home / Self Care | Payer: Medicare (Managed Care) | Source: Ambulatory Visit | Attending: Primary Care | Admitting: Primary Care

## 2017-12-29 VITALS — BP 108/70 | HR 72 | Temp 96.1°F | Ht 66.0 in | Wt 233.0 lb

## 2017-12-29 DIAGNOSIS — M79644 Pain in right finger(s): Secondary | ICD-10-CM

## 2017-12-29 NOTE — Patient Instructions (Signed)
1. Xray of the thumb  2. Tylenol as needed for pain 1000mg  every 8 hours.  3. Apply heat to affected area.

## 2017-12-29 NOTE — Progress Notes (Signed)
[name] is a 72 y.o. female who presents, for Hand Pain (for a month)      Patient presents to the office with complaints of right thumb pain x1 month.  She denies any known injury, however, has had multiple surgeries to that thumb in the past or cyst removals.  Patient is not taking any OTC medication or home treatment.  Patient is right handed. Denies any numbness or tingling or swelling.  Reports a constant aching that increases with movement.      I personally reviewed the patient's medications, allergies, medical history and updated as appropriate.      Current Outpatient Prescriptions   Medication Sig    ranitidine (ZANTAC) 150 MG tablet Take 1 tablet (150 mg total) by mouth 2 times daily    sertraline (ZOLOFT) 50 MG tablet Take 1 tablet (50 mg total) by mouth daily    montelukast (SINGULAIR) 10 MG tablet Take 1 tablet (10 mg total) by mouth nightly    tiotropium (SPIRIVA RESPIMAT) 2.5 MCG/ACT inhaler Inhale 1 puff into the lungs daily    budesonide-formoterol (SYMBICORT) 160-4.5 MCG/ACT inhaler Inhale 2 puffs into the lungs 2 times daily   Shake well before each use.    Misc. Devices (CPAP) machine Download and compliance report    atorvastatin (LIPITOR) 40 MG tablet Take 40 mg by mouth daily (with dinner)    aspirin 81 MG tablet Take 81 mg by mouth every morning       loratadine (KLS ALLERCLEAR) 10 MG tablet Take 20 mg by mouth every morning       ipratropium-albuterol (DUONEB) 0.5-2.5mg  /75mL nebulizer solution Take 3 mLs by nebulization every 6 hours    ipratropium (ATROVENT) 0.06 % nasal spray 2 sprays by Each Nare route 3 times daily    albuterol HFA 108 (90 BASE) MCG/ACT inhaler Inhale 2 puffs into the lungs every 4-6 hours as needed    sodium chloride (OCEAN) 0.65 % nasal spray 1 spray by Each Nare route as needed for Congestion    fluticasone (FLONASE) 50 MCG/ACT nasal spray INHALE 2 SPRAYS INTO EACH NOSTRIL EVERY DAY     azelastine (ASTELIN) 0.1 % nasal spray 1 spray by Nasal route 2 times daily    Sodium Chloride-Sodium Bicarb (AYR SALINE NASAL NETI RINSE) 1.57 G PACK 1 packet by Nasal route daily    Cholecalciferol (VITAMIN D3) 5000 UNITS TABS Take 5,000 Units by mouth every morning       docusate sodium (COLACE) 100 MG capsule Take 200 mg by mouth every morning       clopidogrel (PLAVIX) 75 MG tablet Take 75 mg by mouth every morning          ROS  See pertinent HPI      OBJECTIVE  Vitals:    12/29/17 1351   BP: 108/70   BP Location: Right arm   Patient Position: Sitting   Cuff Size: large adult   Pulse: 72   Temp: 35.6 C (96.1 F)   TempSrc: Temporal   SpO2: 95%   Weight: 105.7 kg (233 lb)   Height: 1.676 m (5\' 6" )     Body mass index is 37.61 kg/m.      GENERAL: A&Ox3, pleasant, cooperative, well groomed, appropriately dressed, actively engaged in encounter, NAD.  EXTREMITIES: right thumb with LROM d/t pain with flexion.  No increased pain with palpation.  No evidence of infection noted.  PSYCH: AAOx3,  normal affect and mood. Insight and judgement intact.       ASSESSMENT & PLAN    1. Thumb pain, right    - * Finger thumb RIGHT standard AP, Lateral, and Oblique views; Future    Patient Instructions   1. Xray of the thumb  2. Tylenol as needed for pain 1000mg  every 8 hours.  3. Apply heat to affected area.    Communicate via mychart xray results.      Follow-up: Return if symptoms worsen or fail to improve.      Patient verbalized understanding of information presented,and confirmed agreement with current plan of care.  Reviewed risks, benefits, and administration of prescribed/refilled medications. Precautions reviewed.      Graceann Congress, FNP-C  12/29/2017  2:32 PM

## 2018-01-08 ENCOUNTER — Encounter: Payer: Self-pay | Admitting: Primary Care

## 2018-01-09 ENCOUNTER — Ambulatory Visit: Payer: Medicare (Managed Care) | Attending: Primary Care | Admitting: Primary Care

## 2018-01-09 ENCOUNTER — Encounter: Payer: Self-pay | Admitting: Primary Care

## 2018-01-09 VITALS — BP 120/76 | HR 78 | Temp 98.5°F | Wt 273.6 lb

## 2018-01-09 DIAGNOSIS — M654 Radial styloid tenosynovitis [de Quervain]: Secondary | ICD-10-CM

## 2018-01-09 MED ORDER — TRIAMCINOLONE ACETONIDE 40 MG/ML IJ SUSP *I*
20.0000 mg | Freq: Once | INTRAMUSCULAR | Status: AC
Start: 2018-01-09 — End: 2018-01-09
  Administered 2018-01-09: 20 mg

## 2018-01-09 MED ORDER — NON-SYSTEM MEDICATION *A*
0 refills | Status: AC
Start: 2018-01-09 — End: ?

## 2018-01-09 NOTE — Progress Notes (Signed)
[facility]    SUBJECTIVE    Pt is here to discuss:    Chief Complaint   Patient presents with    Hand Pain     R hand          1. Wrist pain - seen by NP 8/19 c/o thumb pain. Xray normal  Today she describes pain more in her wrist, worsening x50mo. Points to dorsal volar area.      PMH / Family Hx / Social Hx  Patient's medications, allergies, problem list, past medical, social histories were reviewed and notable for:    Current Outpatient Prescriptions   Medication Sig    Non-System Medication Medication/Supply: R thumb spica brace  Directions for Use: wear nightly    ranitidine (ZANTAC) 150 MG tablet Take 1 tablet (150 mg total) by mouth 2 times daily    ipratropium-albuterol (DUONEB) 0.5-2.5mg  /22mL nebulizer solution Take 3 mLs by nebulization every 6 hours    sertraline (ZOLOFT) 50 MG tablet Take 1 tablet (50 mg total) by mouth daily    montelukast (SINGULAIR) 10 MG tablet Take 1 tablet (10 mg total) by mouth nightly    tiotropium (SPIRIVA RESPIMAT) 2.5 MCG/ACT inhaler Inhale 1 puff into the lungs daily    budesonide-formoterol (SYMBICORT) 160-4.5 MCG/ACT inhaler Inhale 2 puffs into the lungs 2 times daily   Shake well before each use.    ipratropium (ATROVENT) 0.06 % nasal spray 2 sprays by Each Nare route 3 times daily    Misc. Devices (CPAP) machine Download and compliance report    albuterol HFA 108 (90 BASE) MCG/ACT inhaler Inhale 2 puffs into the lungs every 4-6 hours as needed    sodium chloride (OCEAN) 0.65 % nasal spray 1 spray by Each Nare route as needed for Congestion    fluticasone (FLONASE) 50 MCG/ACT nasal spray INHALE 2 SPRAYS INTO EACH NOSTRIL EVERY DAY    atorvastatin (LIPITOR) 40 MG tablet Take 40 mg by mouth daily (with dinner)    azelastine (ASTELIN) 0.1 % nasal spray 1 spray by Nasal route 2 times daily    Sodium Chloride-Sodium Bicarb (AYR SALINE NASAL NETI RINSE) 1.57 G PACK 1 packet by Nasal route daily    Cholecalciferol (VITAMIN D3) 5000 UNITS TABS Take  5,000 Units by mouth every morning       aspirin 81 MG tablet Take 81 mg by mouth every morning       loratadine (KLS ALLERCLEAR) 10 MG tablet Take 20 mg by mouth every morning       docusate sodium (COLACE) 100 MG capsule Take 200 mg by mouth every morning       clopidogrel (PLAVIX) 75 MG tablet Take 75 mg by mouth every morning             OBJECTIVE  Vitals:    01/09/18 1144   BP: 120/76   Pulse: 78   Temp: 36.9 C (98.5 F)   TempSrc: Temporal   SpO2: 97%   Weight: 124.1 kg (273 lb 9.6 oz)     Body mass index is 44.16 kg/m.      General: well-appearing Caucasian female, in NAD  MSK: +finkelstein's test. Exquisite point tenderness proximal to radial styloid  Psych: AAOx3, normal affect and mood. Insight and judgement intact.           ASSESSMENT & PLAN  1. De Quervain's disease (tenosynovitis)  Offered steroid injection (see PROC note)  Rx for thumb spica brace provided  Home exercises provided  -  Non-System Medication; Medication/Supply: R thumb spica brace  Directions for Use: wear nightly  Dispense: 1 each; Refill: 0  - triamcinolone acetonide (KENALOG-40) injection 20 mg; 0.5 mLs (20 mg total) by Subtenon route once              Follow-up: as otherwise scheduled        [name], MD  [facility]  01/09/2018  12:17 PM        ______________________

## 2018-01-09 NOTE — Procedures (Signed)
De Quervain's Injection Procedure Note  [facility]     Procedure: Tennis Must Quervain's tenosynovitis injection  Location: R dorsal volar area  Indication: Pain    Correct pt (2 identifiers)? Y  Correct procedure/site? Y  Correct materials? Y  Participants? Patient, Latoya Drilling, MD ,  Graceann Congress NP  Time started: 1205pm  Time ended: 1209pm    Consent: Prior to proceeding, the patient was counseled on risks (bleeding, infection, adverse reaction to medication, little relief of pain, tendon injury and possible recurrence, benefits (relieving pain), and alternatives (watching) and agreed to procedure.  Informed consent was signed prior to the procedure being performed.    Procedure: Area of maximal tenderness along dorsal volar area proximal to the radial styloid was identified and marked using a pen.  The area was cleaned with hibaclens, and under sterile conditions, 1 ml of  1% lidocaine without epinephrine and 0.39mL Kenalog (40mg /mL) was injected around the tendon at the marked site. Patient tolerated procedure well with minimal pain or discomfort.    Post Procedure Instructions: Patient was counseled on wound care with bandaging and keeping the area clean.  If the area becomes irritated, ulcerated, red, or developed discharge the patient was instructed to call. Rx for thumb spica brace and PT exercises provided to patient.       Latoya Drilling, MD  [facility]  01/09/2018  12:12 PM

## 2018-01-09 NOTE — Patient Instructions (Signed)
[facility]  [address], [place], [place] [address]  [phone]    [facility]  [address], [address]  [phone]    [facility]  [address]  [phone]    [facility]  [address]  [place], [place] [address]  [phone]  (across the street from [employer], behind the [employer] in [place])

## 2018-01-19 ENCOUNTER — Encounter: Payer: Self-pay | Admitting: Primary Care

## 2018-01-19 ENCOUNTER — Ambulatory Visit: Payer: Medicare (Managed Care) | Attending: Primary Care | Admitting: Primary Care

## 2018-01-19 VITALS — BP 122/80 | HR 89 | Temp 96.8°F | Ht 65.98 in | Wt 271.0 lb

## 2018-01-19 DIAGNOSIS — G2581 Restless legs syndrome: Secondary | ICD-10-CM

## 2018-01-19 DIAGNOSIS — J069 Acute upper respiratory infection, unspecified: Secondary | ICD-10-CM

## 2018-01-19 DIAGNOSIS — Z0000 Encounter for general adult medical examination without abnormal findings: Secondary | ICD-10-CM

## 2018-01-19 LAB — HM HIV SCREENING OFFERED

## 2018-01-19 MED ORDER — GABAPENTIN 300 MG PO CAPSULE *I*
300.0000 mg | ORAL_CAPSULE | Freq: Every evening | ORAL | 5 refills | Status: DC
Start: 2018-01-19 — End: 2018-07-20

## 2018-01-19 NOTE — Patient Instructions (Addendum)
Thank you for completing your Initial Annual Medicare visit and Annual Exam   with [place] today.     The purpose of this visits was to:     Screen for disease   Assess risk of future medical problems   Help develop a healthy lifestyle   Update vaccines   Get to know your doctor in case of an illness    Patient Care Team:  [name], MD as PCP - General (Primary Care)  Verdene Lennert, MD  Petronaci, Sharene Butters, MD as Consulting Provider (Pulmonology)  Trilby Leaver, MD as Consulting Provider (Cardiology)  Sonia Side Cornelious Bryant, MD as Consulting Provider (Otolaryngology)  Suszanne Finch, MD (General Surgery)     Medicare 5 Year Plan    The following items were identified as areas of concern during your screening today:  BMI greater than 25 - This is a risk for Heart Attack, Stroke, High Blood Pressure, Diabetes, High Cholesterol and other complications.       The Health Maintenance table below identifies screening tests and immunizations recommended by your health care team:  Health Maintenance   Topic Date Due    HIV TESTING OFFERED  09/26/1958    IMM-ZOSTER (1 of 2) 09/25/1996    Colon Cancer Screening Other  03/20/2016    IMM-INFLUENZA (1) 01/11/2018    Breast Cancer Screening Other  04/10/2018    DEPRESSION SCREEN YEARLY  04/14/2018    Fall Risk Screening  01/10/2019    IMM-PREVNAR VACCINE 89 + YRS  Completed    IMM-PNEUMOVAX VACCINE 65 + YRS  Completed    HEPATITIS C SCREENING OFFERED  Completed     In addition, goals and orders placed to address these recommendations are listed in the "Today's Visit" section.    We wish you the best of health and look forward to seeing you again next year for your Annual Medicare Wellness Visit.     If you have any health care concerns before then, please do not hesitate to contact us.    Please call borg and Sharol Harness to schedule your dexa scan [phone]

## 2018-01-19 NOTE — Progress Notes (Signed)
Today we reviewed and updated [name] smoking status, activities of daily living, depression screen, fall risk, medications and allergies.   I have counseled the patient in the above areas.     Subjective:     Chief Complaint: [name] is a 72 y.o. female here for a/an Initial Annual Medicare visit and Annual Exam    In general, [name] rates their overall health as:  good      Patient Care Team:  [name], MD as PCP - General (Primary Care)  Petronaci, Sharene Butters, MD as Consulting Provider (Pulmonology)  Trilby Leaver, MD as Consulting Provider (Cardiology)  Sonia Side Cornelious Bryant, MD as Consulting Provider (Otolaryngology)  Suszanne Finch, MD (General Surgery)     Current Outpatient Prescriptions on File Prior to Visit   Medication Sig Dispense Refill    ranitidine (ZANTAC) 150 MG tablet Take 1 tablet (150 mg total) by mouth 2 times daily 60 tablet 5    sertraline (ZOLOFT) 50 MG tablet Take 1 tablet (50 mg total) by mouth daily 90 tablet 3    montelukast (SINGULAIR) 10 MG tablet Take 1 tablet (10 mg total) by mouth nightly 30 tablet 6    tiotropium (SPIRIVA RESPIMAT) 2.5 MCG/ACT inhaler Inhale 1 puff into the lungs daily 1 Inhaler 11    budesonide-formoterol (SYMBICORT) 160-4.5 MCG/ACT inhaler Inhale 2 puffs into the lungs 2 times daily   Shake well before each use. 2 Inhaler 11    atorvastatin (LIPITOR) 40 MG tablet Take 40 mg by mouth daily (with dinner)      aspirin 81 MG tablet Take 81 mg by mouth every morning         loratadine (KLS ALLERCLEAR) 10 MG tablet Take 20 mg by mouth every morning         docusate sodium (COLACE) 100 MG capsule Take 200 mg by mouth every morning         clopidogrel (PLAVIX) 75 MG tablet Take 75 mg by mouth every morning         Non-System Medication Medication/Supply: R thumb spica brace  Directions for Use: wear nightly 1 each 0    ipratropium-albuterol (DUONEB) 0.5-2.5mg  /45mL nebulizer solution Take 3 mLs by  nebulization every 6 hours 90 mL 1    ipratropium (ATROVENT) 0.06 % nasal spray 2 sprays by Each Nare route 3 times daily 15 mL 0    Misc. Devices (CPAP) machine Download and compliance report 1 each 0    albuterol HFA 108 (90 BASE) MCG/ACT inhaler Inhale 2 puffs into the lungs every 4-6 hours as needed 1 Inhaler 2    sodium chloride (OCEAN) 0.65 % nasal spray 1 spray by Each Nare route as needed for Congestion 45 mL 3    fluticasone (FLONASE) 50 MCG/ACT nasal spray INHALE 2 SPRAYS INTO EACH NOSTRIL EVERY DAY 16 g 6    azelastine (ASTELIN) 0.1 % nasal spray 1 spray by Nasal route 2 times daily 30 mL 6    Sodium Chloride-Sodium Bicarb (AYR SALINE NASAL NETI RINSE) 1.57 G PACK 1 packet by Nasal route daily 1 each 0    Cholecalciferol (VITAMIN D3) 5000 UNITS TABS Take 5,000 Units by mouth every morning          No current facility-administered medications on file prior to visit.      Allergies   Allergen Reactions    Morphine Hives     Has tolerated Percocet     Patient Active Problem  List    Diagnosis Date Noted    Pulmonary nodule 09/30/2014     Priority: High     CT 02/20/15:  Stable left lower lung 6 mm nodule.     Repeat 12/18 stable, no furhter imaging indicated      CAD (coronary artery disease) 09/30/2014     Priority: Medium     Coronary artery disease   CABG 2013.    PCI to LAD in 8/13.    Coronary angiogram February of 2015 at [facility] after she had chest pain and an abnormal stress test that revealed patent stent and 3/3 grafts patent. The ejection fraction was 60%.    Episode of atypical chest pain 04/2015, negative troponins, negative nuclear stress test.         Prediabetes 09/30/2014     Priority: Medium    Obesity 09/30/2014     Priority: Medium    Severe Obstructive sleep apnea 11/11/2013     Priority: Medium     Adherent with CPAP      Asthma 06/16/2013     Priority: Medium    Dyspnea on exertion 06/13/2013     Priority: Medium    Allergic rhinitis 09/30/2014      Priority: Low    Gastritis 09/30/2014     Priority: Low    Anxiety and depression 06/12/2017    Aortic stenosis 04/14/2017     Mild on echo 05/2016, plan to repeat in 2019      Health care maintenance 01/01/2017     PCN allergy removed after negative allergy testing.       Restless leg syndrome 09/20/2016    Orbital floor fracture 01/04/2016    Facial fracture due to fall 12/28/2015    Dysthymia 09/14/2015     Past Medical History:   Diagnosis Date    Arthritis     Asthma     CAD (coronary artery disease)     DVT (deep venous thrombosis)     post-op after CABG, on anticoagulation for ~2 years    PE (pulmonary embolism)     post-op after CABG, on anticoagulation for ~2 years    Prediabetes     Tubular adenoma 03/2015    colonoscopy      Past Surgical History:   Procedure Laterality Date    ACHILLES TENDON SURGERY      APPENDECTOMY      CHOLECYSTECTOMY      CORONARY ANGIOPLASTY WITH STENT PLACEMENT      CORONARY ARTERY BYPASS GRAFT  2013    triple     CYST REMOVAL      both thumbs    PR REPAIR EYE BLOWOUT,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    Procedure: ORBIT ORIF;  Surgeon: Jannett Celestine, DDS;  Location: [facility] MAIN OR;  Service: OMFS    TONSILLECTOMY AND ADENOIDECTOMY       Family History   Problem Relation Age of Onset    Other Mother         "rare lung disease"    Aneurysm Mother         abdominal aortic aneurysm    Diabetes Type II Mother     Breast cancer Mother     Blood clots Mother     Stroke Father     Stroke Brother         x2    Thyroid cancer Daughter      Social History     Social History    Marital  status: Married     Spouse name: N/A    Number of children: N/A    Years of education: N/A     Occupational History    Not on file.     Social History Main Topics    Smoking status: Never Smoker    Smokeless tobacco: Never Used    Alcohol use No    Drug use: No    Sexual activity: Not on file     Social History Narrative    May 2016        Lives with her husband in an in law  suite at her son's house        Three children, all live locally     Five grandchildren     Retired from school food service       Objective:     Vital Signs: BP 122/80 (BP Location: Left arm, Patient Position: Sitting)    Pulse 89    Temp 36 C (96.8 F)    Ht 1.676 m (5' 5.98")    Wt 122.9 kg (271 lb)    LMP  (LMP Unknown)    SpO2 95%    BMI 43.76 kg/m    BMI: Body mass index is 43.76 kg/m.    Vision Screening Results (Welcome visit only):  No exam data present    Depression Screening Results:  Recent Review Flowsheet Data     PHQ Scores 01/19/2018 04/14/2017 09/20/2016 03/21/2016 04/10/2015    PSQ2 Q1 - Interest/Pleasure N N Y - Y    PSQ2 Q2 - Down, Depressed, Hopeless N N N - Y    PHQ Q9 - Better Off Dead - - 0 0 0    PHQ Calculated Score - - 4 2 4         Opioid Use/DAST- 10 Screening Results:   How many times in the past year have you used an illegal drug or used a prescription medication for nonmedical reasons?: 0 (01/19/2018  3:48 PM)  Activities of Daily Living/Functional Screening Results:  Is the person deaf or does he/she have serious difficulty hearing?: N  Is this person blind or does he/she have serious difficulty seeing even when wearing glasses?: N  *Vision Status: Visual aid   Does this person have serious difficulty walking or climbing stairs?: N  Does this person have difficulty dressing or bathing?: N  *Shopping: Independent  *House Keeping: Independent  *Managing Own Medications: Independent  *Handling Finances: Independent  Difficulty doing errands due to a physicial, mental or emotional condition: No  Difficulty remembering or making decisions due to a physicial, mental or emotional condition: No    Fall Risk Screening Results:  Have you fallen in the last year?: No  Do you feel you are at risk for falling?: No    Assessment and Plan:      Cognitive Function:  Recall of recent and remote events appears:  Normal      Advanced Care Planning:  was discussed and the paperwork can be found in the  scanned media section     The following health maintenance plan was reviewed with the patient:    Health Maintenance Topics with due status: Overdue       Topic Date Due    HIV TESTING OFFERED 09/26/1958    IMM-ZOSTER 09/25/1996    Colon Cancer Screening Other 03/20/2016    IMM-INFLUENZA 01/11/2018     Health Maintenance Topics with due status: Not Due  Topic Last Completion Date    Breast Cancer Screening Other 04/10/2017    DEPRESSION SCREEN YEARLY 04/14/2017    Fall Risk Screening 01/09/2018     Health Maintenance Topics with due status: Completed / Addressed / Aged Out       Topic Last Completion Date    IMM-PREVNAR VACCINE 65 + YRS 09/14/2015    IMM-PNEUMOVAX VACCINE 65 + YRS 09/20/2016    HEPATITIS C SCREENING OFFERED 04/14/2017     This health maintenance schedule, identified risks, a list of orders placed today and patient goals have been provided to [name] in the after visit summary.     Plan for any concerns identified during screening or risk assessments:  Recommended shingrix at pharmacy  Reminded she's due for colonoscopy  Pt declines HIV  HCP updated    [name], MD  [facility]  01/19/2018  4:30 PM

## 2018-01-19 NOTE — H&P (Signed)
[facility]: Adult Physical    [name] is a 72 y.o.female  here for an annual exam today. She also has the following concerns that were addressed today:    1. Cough, hoarse voice, rhinorrhea - no fever or chills. Her cough is not productive. Everyone in her family is getting a similar illness. Husband seen last week for similar sickness    2. Restless legs - worsening. She can "never stop moving her legs" at night.       The patient's past medical history, past surgical history, family history, medications, and allergies were reviewed and updated and are as follows:  Patient Active Problem List    Diagnosis Date Noted    Pulmonary nodule 09/30/2014     Priority: High     CT 02/20/15:  Stable left lower lung 6 mm nodule.     Repeat 12/18 stable, no furhter imaging indicated      CAD (coronary artery disease) 09/30/2014     Priority: Medium     Coronary artery disease   CABG 2013.    PCI to LAD in 8/13.    Coronary angiogram February of 2015 at [facility] after she had chest pain and an abnormal stress test that revealed patent stent and 3/3 grafts patent. The ejection fraction was 60%.    Episode of atypical chest pain 04/2015, negative troponins, negative nuclear stress test.         Prediabetes 09/30/2014     Priority: Medium    Obesity 09/30/2014     Priority: Medium    Severe Obstructive sleep apnea 11/11/2013     Priority: Medium     Adherent with CPAP      Asthma 06/16/2013     Priority: Medium    Dyspnea on exertion 06/13/2013     Priority: Medium    Allergic rhinitis 09/30/2014     Priority: Low    Gastritis 09/30/2014     Priority: Low    Anxiety and depression 06/12/2017    Aortic stenosis 04/14/2017     Mild on echo 05/2016, plan to repeat in 2019      Health care maintenance 01/01/2017     PCN allergy removed after negative allergy testing.       Restless leg syndrome 09/20/2016    Orbital floor fracture 01/04/2016    Facial fracture due to  fall 12/28/2015    Dysthymia 09/14/2015     Past Medical History:   Diagnosis Date    Arthritis     Asthma     CAD (coronary artery disease)     DVT (deep venous thrombosis)     post-op after CABG, on anticoagulation for ~2 years    PE (pulmonary embolism)     post-op after CABG, on anticoagulation for ~2 years    Prediabetes     Tubular adenoma 03/2015    colonoscopy      Past Surgical History:   Procedure Laterality Date    ACHILLES TENDON SURGERY      APPENDECTOMY      CHOLECYSTECTOMY      CORONARY ANGIOPLASTY WITH STENT PLACEMENT      CORONARY ARTERY BYPASS GRAFT  2013    triple     CYST REMOVAL      both thumbs    PR REPAIR EYE BLOWOUT,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    Procedure: ORBIT ORIF;  Surgeon: Jannett Celestine, DDS;  [facility] MAIN OR;  Service: OMFS    TONSILLECTOMY  AND ADENOIDECTOMY       Family History   Problem Relation Age of Onset    Other Mother         "rare lung disease"    Aneurysm Mother         abdominal aortic aneurysm    Diabetes Type II Mother     Breast cancer Mother     Blood clots Mother     Stroke Father     Stroke Brother         x2    Thyroid cancer Daughter      Social History     Social History    Marital status: Married     Spouse name: N/A    Number of children: N/A    Years of education: N/A     Occupational History    Not on file.     Social History Main Topics    Smoking status: Never Smoker    Smokeless tobacco: Never Used    Alcohol use No    Drug use: No    Sexual activity: Not on file     Social History Narrative    May 2016        Lives with her husband in an in law suite at her son's house        Three children, all live locally     Five grandchildren     Retired from [employer] Maintenance Topics with due status: Overdue       Topic Date Due    HIV TESTING OFFERED 09/26/1959    IMM-ZOSTER 09/25/1996    IMM-INFLUENZA 01/11/2018     Health Maintenance Topics with due status: Due Soon        Topic Date Due    Colon Cancer Screening Other 03/20/2018     Health Maintenance Topics with due status: Not Due       Topic Last Completion Date    Breast Cancer Screening Other 04/10/2017    DEPRESSION SCREEN YEARLY 04/14/2017    Fall Risk Screening 01/09/2018     Health Maintenance Topics with due status: Completed / Addressed / Aged Out       Topic Last Completion Date    IMM-PREVNAR VACCINE 65 + YRS 09/14/2015    IMM-PNEUMOVAX VACCINE 65 + YRS 09/20/2016    HEPATITIS C SCREENING OFFERED 04/14/2017         ROS  Constitutional--negative for fever and chills. +~9lbs gained in the past year.  Eyes-- negative for blurred vision, redness/irritation  ENMT:   +rhinorrhea/congestion  --negative for ear pain  +difficulty hearing, not limiting  --negative for mouth lesions/sores   +one sensitive tooth on R upper jaw  +ST, hoarse voice  Respiratory--negative for shortness of breath, wheeze  +dry cough  Cardiovascular--negative for palpitations, syncope  +chest pain at her baseline  Gastrointestinal--negative for abdominal pain, n/v/c/d, bloody stool  Genitourinary:  --negative for pain with urination, hematuria, incontinence  Skin/Breast--negative for rashes or concerning mole  Heme:  --negative for easy bruising or bleeding  Psych--negative for depressed mood, anxiety, substance abuse, SI  MSK   +wrist pain, much better now that she is wearing the brace nightly and s/p dequervain's injection  Neuro--negative for chronic h/a's, falls  +restless legs, worse at night, worsening        PHYSICAL EXAM  Vitals:    01/19/18 1542   BP: 122/80  BP Location: Left arm   Patient Position: Sitting   Pulse: 89   Temp: 36 C (96.8 F)   SpO2: 95%   Weight: 122.9 kg (271 lb)   Height: 1.676 m (5' 5.98")     Body mass index is 43.76 kg/m.     General: well-appearing morbidly obese Caucasian female, pleasant & conversant, in NAD. Dry cough during visit. Clammy skin texture.  Eyes: Wearing glasses. PERRLA. EOMI. Conjunctiva pink, without  swelling or exudate. Lids normal.   ENT: Moist mucous membranes. Normal lips and dentition, no obvious dental or gum disease. B/L TMs grey, without bulging or erythema.   Neck: Trachea midline. Thyroid nontender, without nodularity or enlargement.   Lymph: No cervical or supraclavicular lymphadenopathy.  Lungs: Normal resp effort. CTAB.  No crackles or wheezes.  Cardiac: RRR, no M/R/G. No pedal edema. Pulses 2+ b/l.     Abd: S/NT. Central obesity. Normoactive BS.  No masses or organomegaly.  No guarding or rebound tenderness.  Psych: AAOx3, normal affect and mood. Insight and judgement intact.  Breasts: breasts appear normal, no suspicious masses, no skin or nipple changes or axillary nodes.         ASSESSMENT  Karlina Suares is a 72 y.o. female here for annual physical exam.  The following medical problems were also reviewed and addressed today:    1. Annual physical exam  - Comprehensive metabolic panel; Future  - Hemoglobin A1c; Future  - Lipid add Rfx to Drt LDL if Trig >400; Future  - DEXA Scan; Future      2. Restless leg syndrome  Labs ordered to r/o abnormalities. Low dose Gabapentin 300mg  nightly started today.  - TSH; Future  - Ferritin; Future  - Iron; Future    3. Viral URI  Recommended supportive care with rest, fluids, honey, NSAIDs. Reviewed warning s/s of sinusitis and pneumonia and encouraged to call or return if these develop    4. Preventative health care  Medicare wellness visit done today, see progress note.      PLAN  Repeat physical exam in 1 year    Immunizations:   Immunization History   Administered Date(s) Administered    Influenza, High Dose 05/25/2014, 02/28/2015, 03/21/2016, 02/28/2017    Pneumococcal 13-valent Conjugate (Prevnar 13) 09/14/2015    Pneumococcal Polysaccharide (Pneumovax) 09/20/2016       Weight management: Body mass index is 43.76 kg/m.Marland Kitchen    Discussed the patient's BMI with her. The BMI is above average; BMI management plan is completed.       Anticipatory/preventative guidance  Tobacco use and cessation counseling if indicated: N/A  Alcohol use: very rare, has 1-2 drinks <monthly  Drug use: N/A  Routine sunscreen use advised/ skin cancer prevention discussed: Y  Dentist: Overdue, recommended.   Breast exam: updated today.  Marital/relationship history: lives with husband Joe  Diet/body weight discussed: Y  STD risk screening, counseling, and prevention as appropriate, safe sex/contraception as appropriate: HIV testing offered and declined today.  Shingrix vaccine; recommended at pharmacy  Depression screening evaluation performed today: Y    Recent Review Flowsheet Data     PHQ Scores 01/19/2018 04/14/2017 09/20/2016 03/21/2016 04/10/2015    PSQ2 Q1 - Interest/Pleasure N N Y - Y    PSQ2 Q2 - Down, Depressed, Hopeless N N N - Y    PHQ Q9 - Better Off Dead - - 0 0 0    PHQ Calculated Score - - 4 2 4  Cardiac risk factor modification discussed: Y, updated labs requested.   Diabetes prevention and screening discussed: Y, updated labs requested.   Advance directives / HCP discussed: Y, HCP form completed today. Pt lists her husband Wille Glaser as her primary HCP. She lists her children Erma Heritage, and [name] as her secondary HCPs.      Follow-up: 2mo or sooner PRN    Signed:  [name], MD  [place], am scribing for and in the presence of Dr. [name]. 01/19/2018 4:30 PM.    I, Dr. [name], MD, personally performed the services described in this documentation, as scribed by Luellen Pucker in my presence, and it is accurate and complete.  I have reviewed, added to, and completed this documentation personally and assure that it is complete and accurate. 01/20/2018 7:56 AM

## 2018-03-07 ENCOUNTER — Other Ambulatory Visit: Payer: Self-pay | Admitting: Pulmonology

## 2018-03-07 DIAGNOSIS — R0600 Dyspnea, unspecified: Secondary | ICD-10-CM

## 2018-03-16 ENCOUNTER — Ambulatory Visit: Payer: Medicare (Managed Care) | Attending: Primary Care

## 2018-03-16 DIAGNOSIS — Z23 Encounter for immunization: Secondary | ICD-10-CM

## 2018-03-30 ENCOUNTER — Encounter: Payer: Self-pay | Admitting: Primary Care

## 2018-04-03 ENCOUNTER — Encounter: Payer: Self-pay | Admitting: Primary Care

## 2018-04-03 DIAGNOSIS — M8580 Other specified disorders of bone density and structure, unspecified site: Secondary | ICD-10-CM | POA: Insufficient documentation

## 2018-04-05 ENCOUNTER — Encounter: Payer: Self-pay | Admitting: Primary Care

## 2018-04-11 ENCOUNTER — Other Ambulatory Visit: Payer: Self-pay | Admitting: Primary Care

## 2018-04-11 DIAGNOSIS — J4540 Moderate persistent asthma, uncomplicated: Secondary | ICD-10-CM

## 2018-04-13 ENCOUNTER — Other Ambulatory Visit: Payer: Self-pay

## 2018-04-13 DIAGNOSIS — J4540 Moderate persistent asthma, uncomplicated: Secondary | ICD-10-CM

## 2018-04-13 NOTE — Telephone Encounter (Signed)
Last appt: 01/19/2018  Next appt:  07/20/2018    No flowsheet data found.

## 2018-04-14 ENCOUNTER — Other Ambulatory Visit: Payer: Self-pay

## 2018-04-14 DIAGNOSIS — J4540 Moderate persistent asthma, uncomplicated: Secondary | ICD-10-CM

## 2018-04-14 MED ORDER — BUDESONIDE-FORMOTEROL FUMARATE 160-4.5 MCG/ACT IN AERO *I*
2.0000 | INHALATION_SPRAY | Freq: Two times a day (BID) | RESPIRATORY_TRACT | 11 refills | Status: DC
Start: 2018-04-14 — End: 2019-08-09

## 2018-04-14 NOTE — Telephone Encounter (Signed)
Last appt: 01/19/2018  Next appt:  07/20/2018    No flowsheet data found.

## 2018-05-14 ENCOUNTER — Other Ambulatory Visit: Payer: Self-pay | Admitting: Gastroenterology

## 2018-05-14 LAB — HM DEXA SCAN

## 2018-05-15 ENCOUNTER — Telehealth: Payer: Self-pay

## 2018-05-15 ENCOUNTER — Encounter: Payer: Self-pay | Admitting: Primary Care

## 2018-05-15 NOTE — Telephone Encounter (Signed)
I called the patient and left a message asking her to contact our office. The patient had an appointment on 07/20/18 with Dr. Laurance Flatten. There was a change in her schedule. Please reschedule the patient when she calls back.    I have sent the patient a mychart message as well.

## 2018-05-18 NOTE — Telephone Encounter (Signed)
The patient was rescheduled to 07/24/18.

## 2018-05-25 ENCOUNTER — Other Ambulatory Visit: Payer: Self-pay

## 2018-05-25 DIAGNOSIS — K219 Gastro-esophageal reflux disease without esophagitis: Secondary | ICD-10-CM

## 2018-05-25 MED ORDER — RANITIDINE HCL 150 MG PO TABS *I*
150.0000 mg | ORAL_TABLET | Freq: Two times a day (BID) | ORAL | 3 refills | Status: DC
Start: 2018-05-25 — End: 2018-07-15

## 2018-05-25 NOTE — Telephone Encounter (Signed)
Last appt: 01/19/2018     Next appt:  07/24/2018        No flowsheet data found.

## 2018-06-15 ENCOUNTER — Ambulatory Visit: Payer: Medicare (Managed Care) | Admitting: Pulmonology

## 2018-06-15 ENCOUNTER — Other Ambulatory Visit: Payer: Medicare (Managed Care)

## 2018-06-24 ENCOUNTER — Ambulatory Visit: Payer: Medicare (Managed Care) | Attending: Pulmonology | Admitting: Pulmonology

## 2018-06-24 ENCOUNTER — Encounter: Payer: Self-pay | Admitting: Pulmonology

## 2018-06-24 ENCOUNTER — Ambulatory Visit: Payer: Medicare (Managed Care) | Attending: Pulmonology

## 2018-06-24 VITALS — BP 155/74 | HR 83 | Temp 98.2°F | Resp 18 | Ht 66.14 in | Wt 275.1 lb

## 2018-06-24 DIAGNOSIS — J449 Chronic obstructive pulmonary disease, unspecified: Secondary | ICD-10-CM | POA: Insufficient documentation

## 2018-06-24 NOTE — Progress Notes (Signed)
Dear Doctor [name], MD:      Demetria Pore Moe was seen in Pulmonary Clinic on 06/24/2018 in follow up for shortness of breath, morbid obesity, asthma, and OSA.   As you know, Mrs. Bamba is a 73 y.o. female patient who was last seen by me in January 2018.   She was seen by Ricky Ala, NP in February 2019.  At that time we made no changes to her inhalers and she was to continue her CPAP and follow up about her swallowing problems.     Today, Mrs. Kabler reports she has been doing well.  She had no interval respiratory illnesses.   She is using Symbicort, Spiriva, and albuterol.  She uses the albuterol infrequently.  However the past couple weeks she has used it because of some congestion.  She has not had fever or cough.  She is taking Singulair at night.  She has had the seasonal influenza vaccine.      She did not travel in the past year.  Her weight is up and down and she is still going to [employer].  She is managing her reflux disease.      Mrs. Hurd continues to use CPAP nightly. She sleeps through the night and feels rested during the day.       Pulmonary nedications were reconciled in the EMR and reviewed.  Pulmonary medications are Spiriva Respimat, Symbicort, and CPAP, and albuterol.        On my exam,Blood pressure 155/74, pulse 83, temperature 36.8 C (98.2 F), temperature source Temporal, resp. rate 18, height 1.68 m (5' 6.14"), weight 124.8 kg (275 lb 1.6 oz), SpO2 99 %.  No acute distress. HEENT exam notable for anicteric sclera. Oropharynx clear with no evidence of thrush. There was no palpable cervical or supraclavicular adenopathy. Cardiac tones were normal with no murmur, gallop, or rub.  Lung exam revealed good air entry bilaterally with no rales, rhonchi, or wheezing.  Abdomen was soft and nontender with no palpable masses.  There was no lower extremity edema. She was alert and oriented and had a grossly nonfocal neurological exam.    Spirometry was performed at this  visit and revealed a reduced FEV1/FVC ratio of 70, with a moderately reduced FEV1 of 1.71 liters (62% of predicted) and a mildly reduced FVC of 2.45 liters (76% of predicted).  Spirometry is normal today.       Impression and Recommendations: Stepahnie Campo is a 73 y.o. female patient with dyspnea/asthma, morbid obesity, and OSA.  Her spirometry is stable to a bit better  and she is using her CPAP.  Mrs. Volkert is trying to manage her weight.  She is doing quite well.     At this time my recommendations include:    1.  Continue Symbicort, Sprivia Respimat, Singulair, albuterol and CPAP.     2.  Exercise and weight loss.   3.  Continue CPAP.    4.  Follow up in 1 year with spirometry.  We are happy to see her sooner if necessary.   5.  Influenza vaccine in the fall when available.     Thank you for allowing me to continue in the care of [employer].  Please do not hesitate to contact me if you have any questions or comments.       Sincerely,    Maurine Simmering, MD  Pulmonary and Critical Care Medicine

## 2018-06-24 NOTE — Patient Instructions (Addendum)
You are looking well.   Continue Singulair, Spiriva, Symbicort, and albuterol  Continue your CPAP  Weight loss and exercise  Your breathing test was better today  See you in 1 year.  Happy to see you sooner if necessary.    Get the flu shot in the fall!

## 2018-07-13 ENCOUNTER — Encounter: Payer: Self-pay | Admitting: Gastroenterology

## 2018-07-15 ENCOUNTER — Other Ambulatory Visit: Payer: Self-pay

## 2018-07-15 DIAGNOSIS — K219 Gastro-esophageal reflux disease without esophagitis: Secondary | ICD-10-CM

## 2018-07-15 MED ORDER — RANITIDINE HCL 150 MG PO TABS *I*
150.0000 mg | ORAL_TABLET | Freq: Two times a day (BID) | ORAL | 3 refills | Status: DC
Start: 2018-07-15 — End: 2018-09-15

## 2018-07-15 NOTE — Telephone Encounter (Signed)
Please resend. Per pharmacy this script was never received

## 2018-07-15 NOTE — Telephone Encounter (Signed)
Last appt: 01/19/2018     Next appt:  07/24/2018        No flowsheet data found.

## 2018-07-20 ENCOUNTER — Ambulatory Visit: Payer: Medicare (Managed Care) | Admitting: Primary Care

## 2018-07-20 ENCOUNTER — Other Ambulatory Visit: Payer: Self-pay | Admitting: Primary Care

## 2018-07-20 NOTE — Telephone Encounter (Signed)
Last appt: 01/19/2018     Next appt:  07/24/2018        No flowsheet data found.

## 2018-07-24 ENCOUNTER — Ambulatory Visit: Payer: Medicare (Managed Care) | Attending: Primary Care | Admitting: Primary Care

## 2018-07-24 ENCOUNTER — Other Ambulatory Visit
Admission: RE | Admit: 2018-07-24 | Discharge: 2018-07-24 | Disposition: A | Discharge status: Home / Self Care | Payer: Medicare (Managed Care) | Source: Ambulatory Visit | Attending: Primary Care | Admitting: Primary Care

## 2018-07-24 ENCOUNTER — Encounter: Payer: Self-pay | Admitting: Primary Care

## 2018-07-24 VITALS — BP 108/66 | HR 80 | Temp 96.5°F | Ht 66.14 in | Wt 272.0 lb

## 2018-07-24 DIAGNOSIS — Z9109 Other allergy status, other than to drugs and biological substances: Secondary | ICD-10-CM

## 2018-07-24 DIAGNOSIS — R1310 Dysphagia, unspecified: Secondary | ICD-10-CM

## 2018-07-24 DIAGNOSIS — R7303 Prediabetes: Secondary | ICD-10-CM

## 2018-07-24 DIAGNOSIS — G4733 Obstructive sleep apnea (adult) (pediatric): Secondary | ICD-10-CM | POA: Insufficient documentation

## 2018-07-24 DIAGNOSIS — G2581 Restless legs syndrome: Secondary | ICD-10-CM

## 2018-07-24 DIAGNOSIS — I2510 Atherosclerotic heart disease of native coronary artery without angina pectoris: Secondary | ICD-10-CM | POA: Insufficient documentation

## 2018-07-24 DIAGNOSIS — L989 Disorder of the skin and subcutaneous tissue, unspecified: Secondary | ICD-10-CM

## 2018-07-24 DIAGNOSIS — E538 Deficiency of other specified B group vitamins: Secondary | ICD-10-CM | POA: Insufficient documentation

## 2018-07-24 DIAGNOSIS — K130 Diseases of lips: Secondary | ICD-10-CM

## 2018-07-24 DIAGNOSIS — Z7189 Other specified counseling: Secondary | ICD-10-CM

## 2018-07-24 LAB — CBC
Hematocrit: 42 % (ref 34–45)
Hemoglobin: 13.3 g/dL (ref 11.2–15.7)
MCH: 29 pg/cell (ref 26–32)
MCHC: 31 g/dL — ABNORMAL LOW (ref 32–36)
MCV: 93 fL (ref 79–95)
Platelets: 251 10*3/uL (ref 160–370)
RBC: 4.6 MIL/uL (ref 3.9–5.2)
RDW: 13.3 % (ref 11.7–14.4)
WBC: 8 10*3/uL (ref 4.0–10.0)

## 2018-07-24 LAB — TSH: TSH: 2.37 u[IU]/mL (ref 0.27–4.20)

## 2018-07-24 LAB — COMPREHENSIVE METABOLIC PANEL
ALT: 31 U/L (ref 0–35)
AST: 29 U/L (ref 0–35)
Albumin: 3.9 g/dL (ref 3.5–5.2)
Alk Phos: 135 U/L — ABNORMAL HIGH (ref 35–105)
Anion Gap: 9 (ref 7–16)
Bilirubin,Total: 0.3 mg/dL (ref 0.0–1.2)
CO2: 29 mmol/L — ABNORMAL HIGH (ref 20–28)
Calcium: 9.6 mg/dL (ref 8.6–10.2)
Chloride: 104 mmol/L (ref 96–108)
Creatinine: 0.79 mg/dL (ref 0.51–0.95)
GFR,Black: 87 *
GFR,Caucasian: 75 *
Glucose: 102 mg/dL — ABNORMAL HIGH (ref 60–99)
Lab: 19 mg/dL (ref 6–20)
Potassium: 4.6 mmol/L (ref 3.3–5.1)
Sodium: 142 mmol/L (ref 133–145)
Total Protein: 6.5 g/dL (ref 6.3–7.7)

## 2018-07-24 LAB — HEMOGLOBIN A1C: Hemoglobin A1C: 5.9 % — ABNORMAL HIGH

## 2018-07-24 LAB — LIPID PANEL
Chol/HDL Ratio: 4
Cholesterol: 151 mg/dL
HDL: 38 mg/dL — ABNORMAL LOW (ref 40–60)
LDL Calculated: 74 mg/dL
Non HDL Cholesterol: 113 mg/dL
Triglycerides: 197 mg/dL — AB

## 2018-07-24 LAB — IRON: Iron: 50 ug/dL (ref 34–165)

## 2018-07-24 LAB — FERRITIN: Ferritin: 85 ng/mL (ref 10–120)

## 2018-07-24 LAB — VITAMIN B12: Vitamin B12: 1123 pg/mL (ref 232–1245)

## 2018-07-24 MED ORDER — GABAPENTIN 300 MG PO CAPSULE *I*
300.0000 mg | ORAL_CAPSULE | Freq: Two times a day (BID) | ORAL | 2 refills | Status: DC
Start: 2018-07-24 — End: 2018-09-18

## 2018-07-24 NOTE — Progress Notes (Signed)
[facility]    SUBJECTIVE    Pt is here to discuss:    Chief Complaint   Patient presents with    Obesity     6 month follow up labs     1. Sinus congestion and pain, nasal mass? - x 1-51mo. Comes/goes. No fevers or purulent drainage. She has been taking Tylenol with minimal relief. She has already been using Flonase and antihistamine. She did have allergy shots ~10yrs ago. She is having similar sx compared to 14yrs ago, and would like to try shots again. She can feel a bump in her R nare when blowing her nose.     2. Dysphagia - worsening. When swallowing pills or food, sometimes it will get stuck in her throat. She denies liquids getting stuck. She did have esophageal dilation years ago. She has established care with GI Dr. Birder Robson, also due for her colonoscopy.    3. Lesion on R cheek - x 5-29mo. Asking for this to be checked today.     4. Lip soreness/lesion - she reports daily soreness involving R sided upper and lower lip, worsens throughout the day with movement. She denies a mass, but does have a white spot at her upper inner lip that is not new and has not changed, but is sometimes irritating. She uses gabapentin for restless leg symptoms with relief, doesn't think it's helped with lip symptoms yet        PMH / Family Hx / Social Hx  Patient's medications, allergies, problem list, past medical, social histories were reviewed and notable for:    Current Outpatient Medications   Medication Sig    gabapentin (GABAPENTIN) 300 MG capsule Take 1 capsule (300 mg total) by mouth 2 times daily    raNITIdine (ZANTAC) 150 MG tablet Take 1 tablet (150 mg total) by mouth 2 times daily    budesonide-formoterol (SYMBICORT) 160-4.5 MCG/ACT inhaler Inhale 2 puffs into the lungs 2 times daily Shake well before each use.    montelukast (SINGULAIR) 10 MG tablet TAKE 1 TABLET BY MOUTH NIGHTLY    Non-System Medication Medication/Supply: R thumb spica brace  Directions for Use: wear nightly     ipratropium-albuterol (DUONEB) 0.5-2.5mg  /102mL nebulizer solution Take 3 mLs by nebulization every 6 hours    sertraline (ZOLOFT) 50 MG tablet Take 1 tablet (50 mg total) by mouth daily    tiotropium (SPIRIVA RESPIMAT) 2.5 MCG/ACT inhaler Inhale 1 puff into the lungs daily    Misc. Devices (CPAP) machine Download and compliance report    albuterol HFA 108 (90 BASE) MCG/ACT inhaler Inhale 2 puffs into the lungs every 4-6 hours as needed    sodium chloride (OCEAN) 0.65 % nasal spray 1 spray by Each Nare route as needed for Congestion    fluticasone (FLONASE) 50 MCG/ACT nasal spray INHALE 2 SPRAYS INTO EACH NOSTRIL EVERY DAY    atorvastatin (LIPITOR) 40 MG tablet Take 40 mg by mouth daily (with dinner)    azelastine (ASTELIN) 0.1 % nasal spray 1 spray by Nasal route 2 times daily    Sodium Chloride-Sodium Bicarb (AYR SALINE NASAL NETI RINSE) 1.57 G PACK 1 packet by Nasal route daily    Cholecalciferol (VITAMIN D3) 5000 UNITS TABS Take 5,000 Units by mouth every morning       aspirin 81 MG tablet Take 81 mg by mouth every morning       loratadine (KLS ALLERCLEAR) 10 MG tablet Take 20 mg by mouth every morning  docusate sodium (COLACE) 100 MG capsule Take 200 mg by mouth every morning       clopidogrel (PLAVIX) 75 MG tablet Take 75 mg by mouth every morning       ipratropium (ATROVENT) 0.06 % nasal spray 2 sprays by Each Nare route 3 times daily       ROS  Denies fever, tooth pain, purulent drainage  Denies difficulty swallowing liquids  Denies mass on her lip    Mole on back and upper arm, unchanged x years        OBJECTIVE  Vitals:    07/24/18 1130   BP: 108/66   Pulse: 80   Temp: 35.8 C (96.5 F)   Weight: 123.4 kg (272 lb)   Height: 1.68 m (5' 6.14")     Body mass index is 43.72 kg/m.      General: well-appearing morbidly obese Caucasian female, pleasant & conversant, in NAD. Here with husband.   Eyes: PERRLA. EOMI. Conjunctiva pink without swelling or exudate. Infraorbital lid swelling, purplish  erythema and creasing.    ENT: Mild erythema of R septum without discrete mass or ulceration. Turbinates swollen b/l, no mucus visualized, sinuses non tender. Moist mucous membranes. L upper inner lip has 63mm white papule at midline. Outer lips normal appearing.   Neck: Trachea midline. Thyroid nontender, without nodularity or enlargement.   Lymph: No cervical or supraclavicular lymphadenopathy.  Lungs: Normal resp effort. CTAB.  No crackles or wheezes.  Cardiac: RRR, no M/R/G.     Psych: AAOx3, normal affect and mood. Insight and judgement intact.   Skin: flesh colored subcentimeter flat topped papule with dry scale at R TMJ. Wide based flesh colored subcentimeter skin tag at R posterior arm and L bra line. Normal appearing subcentimeter nevus at mid scapular line.         ASSESSMENT & PLAN    1. Environmental allergies  Referred for allergy testing. No nasal lesion on my exam. Will ask allergist to repeat exam.  - AMB REFERRAL TO ALLERGY/IMMUNOLOGY/RHEUMATOLOGY    2. Dysphagia, unspecified type  Will get barium study to evaluate for stricture, and pending results, will refer to Dr. Verner Chol for EGD/dilation and colonoscopy, which was due 03/2018.  - Esophagram/barium swallow; Future    3. Skin lesion of face  Will continue with observation for now. Suspect AK vs SK. Advised to let me know if she notices changes, or if she would like cryotherapy treatment.     4. Lip pain  Recommended doubling gabapentin to BID, suspect lip pain could be neuropathic based on previous description and h/o facial fracture. If no improvement, consider referral to surgeon to evaluate lip mass and consider excision.    5. Severe Obstructive sleep apnea  Updated labs requested  - CBC; Future    6. Prediabetes  Updated labs requested  - Hemoglobin A1c; Future  - Comprehensive metabolic panel; Future    7 Coronary artery disease, angina presence unspecified, unspecified vessel or lesion type, unspecified whether native or transplanted  heart  Updated labs requested  - Lipid Panel (Reflex to Direct  LDL if Triglycerides more than 400); Future    8 Educated About 2019 Novel Coronavirus Infection  We discussed that coronavirus is spread by droplets. Advised to wash hands frequently and avoid touching the face in public. Reviewed S/S of fever, cough, and SOB, and advised to call our office if there are any concerns.         Follow-up: 35mo or sooner PRN  [name], MD  [place]  07/24/2018  12:11 PM      I Luellen Pucker, am scribing for and in the presence of Dr. [name]. 07/24/2018 12:11 PM.    I, Dr. [name], MD, personally performed the services described in this documentation, as scribed by Luellen Pucker in my presence, and it is accurate and complete.  I have reviewed, added to, and completed this documentation personally and assure that it is complete and accurate. 07/24/2018 3:40 PM        ______________________

## 2018-07-24 NOTE — Patient Instructions (Signed)
[name]  [phone] opt #[id number]

## 2018-08-05 ENCOUNTER — Other Ambulatory Visit: Payer: Self-pay | Admitting: Pulmonology

## 2018-08-06 MED ORDER — TIOTROPIUM BROMIDE MONOHYDRATE 2.5 MCG/ACT IN AERS *I*
1.0000 | INHALATION_SPRAY | Freq: Every day | RESPIRATORY_TRACT | 3 refills | Status: DC
Start: 2018-08-06 — End: 2019-08-09

## 2018-08-13 ENCOUNTER — Ambulatory Visit: Payer: Medicare (Managed Care) | Attending: Allergy/Immunology/Rheumatology | Admitting: Allergy/Immunology/Rheumatology

## 2018-08-13 DIAGNOSIS — R05 Cough: Secondary | ICD-10-CM

## 2018-08-13 DIAGNOSIS — J310 Chronic rhinitis: Secondary | ICD-10-CM

## 2018-08-13 DIAGNOSIS — R059 Cough, unspecified: Secondary | ICD-10-CM

## 2018-08-13 MED ORDER — EASIVENT MISC *I*
2 refills | Status: DC
Start: 2018-08-13 — End: 2020-08-25

## 2018-08-13 MED ORDER — FLUTICASONE PROPIONATE 50 MCG/ACT NA SUSP *I*
2.0000 | Freq: Every day | NASAL | 1 refills | Status: AC
Start: 2018-08-13 — End: ?

## 2018-08-13 NOTE — Progress Notes (Signed)
Telephone Visit     This is an established patient visit.    Reason for visit: Follow-up (stuffy head)      HPI:  [name] is a 73yo female who presents to discuss allergy concerns. I last saw [name] in August 2018 for penicillin allergy testing.     For the last 3 months she's had a stuffy head and a constantly runny nose. Nasal discharge is thin and clear. She has a dry cough that hasn't gone away. It's triggered by post nasal drip. Her ears feel clogged at times and she has difficulty hearing. Her eyes are not affected. No headaches. No fevers. Symptoms don't keep her up at night. She is able to use her CPAP machine at night.     She coughs because she feels like there's something in her throat, but when she coughs and nothing comes up. She coughs more if she's talking too much.     Nothing was different around the time that her symptoms started. She had no illnesses around that time.     She is taking two tablets of cetirizine in the morning.   She is taking montelukast at night.   She is not using any nasal sprays right now. Has used Flonase, azelastine, and ipratropium in the past. Most recently she was taking Flonase until she ran out. She ran out a few weeks ago.     Spring/summer is usually the worst time for her allergies.   Skin testing was last done in July 2016. At that time she was positive for sensitivities to birch tree, grasses, and molds.    She is followed by Dr. Tamala Julian (pulmonary) for asthma/COPD. She takes Symbicort 2 puffs BID and Spiriva 1 puff once every evening. She is consistent with using both inhalers as directed. She is using her rescue inhaler every other day for tightness in her chest. She hasn't used the nebulizer in a while.       She takes ranitidine for GERD twice daily. Her GERD is controlled as long as she remains consistent with taking the ranitidine.     She reports there have been no changes in health since last visit.       Patient's problem list, allergies, and  medications were reviewed and updated as appropriate. Please see the EHR for full details.    Assessment Plan:    1. Chronic rhinitis, likely mixed allergic/non-allergic, with sensitivities to birch tree, grasses, and molds.   -- Restart Flonase 2 sprays in each nostril once daily. Nasal spray technique was reviewed.   -- Continue cetirizine and montelukast without change.  -- If symptoms persist would add either azelastine or ipratropium.    2. Cough   -- Note - the patient had a persistent dry cough that was heard through out today's visit.   -- Differential includes: post nasal drip/chronic rhinitis, asthma, laryngeal hypersensitivity, GERD  -- Restart Flonase as above.   -- Continue Symbicort and Spiriva as directed by pulmonary. Use a spacer device with the Symbicort. Spacer/inhaler technique was discussed.   -- Continue ranitidine for GERD management.     Follow up: 1 month telehome visit      The plan was discussed with the patient and the patient/patient rep demonstrated understanding to the provider's satisfaction.    Consent was previously obtained from the patient to complete this telephone consult; including the potential for financial liability.    21+ minutes was spent on the phone with the patient, patient representatives,  and/or other attendees.     Thank you for allowing [place] to participate in the care of your patient. Please do not hesitate to contact us with any questions or concerns.     Sincerely,     Juel Burrow, FNP  Allergy & Immunology  [facility]  [address], [address]  [place], [place] [address]     [place], [place]  [place], [place] [address]  Clinic phone: [phone]

## 2018-08-14 ENCOUNTER — Telehealth: Payer: Self-pay | Admitting: Allergy/Immunology/Rheumatology

## 2018-08-14 ENCOUNTER — Encounter: Payer: Self-pay | Admitting: Allergy/Immunology/Rheumatology

## 2018-08-14 NOTE — Patient Instructions (Signed)
--   Continue the cetirizine (Zyrtec) and montelukast (Singulair).   -- Restart Flonase 2 sprays in each nostril once daily.   -- Nasal spray instructions: Angle spray out towards your eye, don't place tip of spray too far into your nose, don't sniff.     -- Continue on the Symbicort and Spiriva, as instructed by your pulmonologist.   -- Use the Symbicort with a spacer.     We'll do another telephone visit in 1 month to follow up.

## 2018-08-14 NOTE — Telephone Encounter (Signed)
Patient to return in 1 month telehome with Margreta Journey    Thank you  [facility]

## 2018-08-17 NOTE — Telephone Encounter (Signed)
Scheduled on 09/16/18 and sent pt a msg.

## 2018-08-25 ENCOUNTER — Other Ambulatory Visit: Payer: Self-pay | Admitting: Primary Care

## 2018-08-25 DIAGNOSIS — F419 Anxiety disorder, unspecified: Secondary | ICD-10-CM

## 2018-09-02 NOTE — Telephone Encounter (Signed)
Writer called & spoke to patient & we rescheduled her 1 month follow-up with Juel Burrow, FNP to 09/15/18 for a Tele-video app't.  Zoom meeting has been sch'd. (Christina's 09/16/18 is currently not available the morning of 09/16/18).

## 2018-09-15 ENCOUNTER — Ambulatory Visit: Payer: Medicare (Managed Care) | Admitting: Allergy/Immunology/Rheumatology

## 2018-09-15 ENCOUNTER — Encounter: Payer: Self-pay | Admitting: Allergy/Immunology/Rheumatology

## 2018-09-15 DIAGNOSIS — J310 Chronic rhinitis: Secondary | ICD-10-CM

## 2018-09-15 DIAGNOSIS — K219 Gastro-esophageal reflux disease without esophagitis: Secondary | ICD-10-CM

## 2018-09-15 DIAGNOSIS — R05 Cough: Secondary | ICD-10-CM

## 2018-09-15 DIAGNOSIS — J45909 Unspecified asthma, uncomplicated: Secondary | ICD-10-CM

## 2018-09-15 DIAGNOSIS — J309 Allergic rhinitis, unspecified: Secondary | ICD-10-CM

## 2018-09-15 DIAGNOSIS — R059 Cough, unspecified: Secondary | ICD-10-CM

## 2018-09-15 MED ORDER — AZELASTINE HCL 137 MCG/SPRAY NA SOLN *I*
1.0000 | Freq: Two times a day (BID) | NASAL | 3 refills | Status: AC
Start: 2018-09-15 — End: ?

## 2018-09-15 MED ORDER — PANTOPRAZOLE SODIUM 20 MG PO TBEC *I*
20.0000 mg | DELAYED_RELEASE_TABLET | Freq: Every day | ORAL | 3 refills | Status: DC
Start: 2018-09-15 — End: 2020-02-02

## 2018-09-15 NOTE — Patient Instructions (Signed)
--   Your asthma seems to be well controlled. Continue your Symbicort and Spiriva inhalers as prescribed.     -- Allergies may be contributing to your congestion, nasal symptoms. May be contributing to cough some, but I doubt they're the only cause for the cough.     -- Continue Flonase 2 sprays in each nostril once daily in the morning.   -- Add azelastine 2 sprays in each nostril once daily in the evening. If your nose symptoms, congestion, headaches do not improve, please increase the azelastine to 2 sprays in each nostril twice daily.   -- Nasal spray instructions: Angle spray out towards your eye, don't place tip of spray too far into your nose, don't sniff.     -- Continue cetirizine 2 tablets daily in the morning  -- Continue montelukast 1 tablet daily in the evening    -- I think your acid reflux may be contributing to the cough.   -- Stop ranitidine. Start pantoprazole 1 tablet daily in the morning, about 30 minutes before eating breakfast. It will take several weeks for this medication to reach its full effect.     -- We'll plan to follow up in 2 months, but please call sooner if your symptoms are getting worse or if you have any other concerns.

## 2018-09-15 NOTE — Progress Notes (Signed)
Video Visit     Location of Patient: home    Location of Telemedicine Provider: home office    Other participants in telemedicine encounter and roles:  None    Reason for visit: Follow-up and Cough      HPI  [name] is a 73yo female who presents for follow up regarding cough. I last spoke with Elysse 1 month ago via telemedicine visit.     Her symptoms seem to be getting worse, not better. She is still coughing. The cough is dry, nonproductive. Cough has been progressively worse over the last 3 months.   The cough persists through out the day. It is not waking her up at night.   She is still able sleep with the CPAP.   The cough is not worse after eating or when she lays down.   If she walks up or down the stairs she might cough a little more.   If she talks too long it might start to get worse.   Sometimes if she drinks something cold it helps the cough a little.     No chest tightness or wheezing.   She feels short of breath with moderate exertion, resolves with a few minutes of rest. This is her baseline.     She has a constant sinus headache. She points to her forehead, below her eyes as the location for the headache. Tylenol helps with the headache.   She is sneezing, has a runny nose. Sneezing is worst in the morning, improves through out the day. She is blowing her nose frequently. Drainage is clear, thin.   She still has a lot of mucous in her throat. Sometimes it feels like she has something stuck in her throat.   She loses her voice at times.   Her eyes are itchy, but not too bad.   Her ears haven't been bothering her.     She has been using Flonase 2 sprays in each nostril daily in the morning.  She hasn't tried the azelastine spray.  Continues Cetirizine 20mg  daily in the morning.   Continues Montelukast 10mg  daily in the evening.   She remains on the Symbicort and Spiriva.   She used albuterol about 4 times in the last month.     Her acid reflux has been fine.   She is taking ranitidine 150mg  twice  daily. Recently received a letter from her insurance stating that it needs to be changed.   Was on pantoprazole for a long time. Stopped about 1 year ago.       Her husband is admitted to [facility] for CHF. She has been able to speak to him on his cell phone. They're hoping he will be discharged tomorrow.       ROS  Except as per HPI, 10 systems were reviewed and were normal.    Patient's problem list, allergies, and medications were reviewed and updated as appropriate.  Please see the EHR for full details.    Exam and data reviewed:  Constitutional: Alert, appears well, and in no acute distress  Eyes: No eyelid or periorbital edema  Nose: No significant discharge. No sneezing or nose blowing during visit.  Respiratory: Breathing comfortably on room air. She is speaking clearly in full sentences. Dry cough noted. No audible wheezing.   Skin: No visible rash (face only seen)  Neurologic: Moving all extremities, mental status normal for age  Psychiatric: Normal affect for age      Assessment & Plan:  Leone is a 73yo female presenting for evaluation of cough and chronic rhinitis symptoms.     1. Cough   -- Differential includes: GERD, laryngeal hypersensitivity, post nasal drip/chronic rhinitis. Her asthma seems to be controlled.   -- Stop ranitidine. Start pantoprazole 20mg  daily (taken 30 min before breakfast).   -- Continue Symbicort and Spiriva as directed by pulmonary.   -- Albuterol for acute symptoms.    2. Chronic rhinitis, likely mixed allergic/non-allergic, with sensitivities to birch tree, grasses, and molds.   -- Continue Flonase 2 sprays in each nostril once daily in the morning.   -- Add azelastine 2 sprays in each nostril once daily in the evening.  -- Continue cetirizine and montelukast without change.      Follow up: 2 months. Sooner if needed.       Consent was obtained from the patient to complete this video visit; including the potential for financial liability.    Thank you for allowing [place] to  participate in the care of your patient. Please do not hesitate to contact us with any questions or concerns.     Sincerely,     Juel Burrow, FNP  Allergy & Immunology  [facility]  [address]  [place], [place] [address]    [place], [place]  [place], [place] [address]  Clinic phone: [phone]

## 2018-09-16 ENCOUNTER — Telehealth: Payer: Medicare (Managed Care) | Admitting: Allergy/Immunology/Rheumatology

## 2018-09-18 ENCOUNTER — Other Ambulatory Visit: Payer: Self-pay | Admitting: Primary Care

## 2018-09-18 MED ORDER — GABAPENTIN 300 MG PO CAPSULE *I*
300.0000 mg | ORAL_CAPSULE | Freq: Two times a day (BID) | ORAL | 2 refills | Status: DC
Start: 2018-09-18 — End: 2019-02-03

## 2018-10-13 ENCOUNTER — Other Ambulatory Visit: Payer: Self-pay | Admitting: Pulmonology

## 2018-10-13 DIAGNOSIS — R0600 Dyspnea, unspecified: Secondary | ICD-10-CM

## 2018-10-13 MED ORDER — MONTELUKAST SODIUM 10 MG PO TABS *I*
10.0000 mg | ORAL_TABLET | Freq: Every evening | ORAL | 11 refills | Status: DC
Start: 2018-10-13 — End: 2019-10-12

## 2018-10-14 ENCOUNTER — Encounter: Payer: Self-pay | Admitting: Primary Care

## 2018-10-15 NOTE — Telephone Encounter (Signed)
Please schedule in office with me where able re: Dequervain's injection PROC/ AWV (82min)    [name], MD  [place]  10/15/2018  4:57 PM

## 2018-10-15 NOTE — Telephone Encounter (Signed)
Can we offer [name] an appointment for another injection? Looks like her last one was August 2019.

## 2018-10-16 NOTE — Telephone Encounter (Signed)
The patient has been scheduled

## 2018-10-19 NOTE — Progress Notes (Deleted)
Pre-Visit Planning    Health Maintenance Due   Topic Date Due    IMM-ZOSTER (1 of 2) 09/25/1996    Colon Cancer Screening Other  03/20/2018       Notes:  - Last Mammogram: 05/14/2018 Completed @: [facility]     - Colonoscopy - Birder Robson     Completed on 10/19/18 by Ollen Gross

## 2018-10-22 ENCOUNTER — Encounter: Payer: Self-pay | Admitting: Primary Care

## 2018-10-22 ENCOUNTER — Telehealth: Payer: Self-pay

## 2018-10-22 ENCOUNTER — Ambulatory Visit: Payer: Medicare (Managed Care) | Admitting: Primary Care

## 2018-10-22 VITALS — BP 122/71 | HR 80 | Temp 98.2°F | Ht 66.14 in | Wt 281.4 lb

## 2018-10-22 DIAGNOSIS — M654 Radial styloid tenosynovitis [de Quervain]: Secondary | ICD-10-CM

## 2018-10-22 DIAGNOSIS — Z0000 Encounter for general adult medical examination without abnormal findings: Secondary | ICD-10-CM

## 2018-10-22 MED ORDER — TRIAMCINOLONE ACETONIDE 40 MG/ML IJ SUSP *I*
20.0000 mg | Freq: Once | INTRAMUSCULAR | Status: AC
Start: 2018-10-22 — End: 2018-10-22
  Administered 2018-10-22: 20 mg

## 2018-10-22 NOTE — Telephone Encounter (Signed)
Received request for for medical records and form to be filled out given to Dr, Laurance Flatten once finched pkease sent out

## 2018-10-22 NOTE — Patient Instructions (Signed)
Thank you for completing your Subsequent Annual Medicare Visit and Procedure   with [place] today.     The purpose of this visits was to:     Screen for disease   Assess risk of future medical problems   Help develop a healthy lifestyle   Update vaccines   Get to know your doctor in case of an illness    Patient Care Team:  [name], MD as PCP - General (Primary Care)  Tamala Julian, Sharene Butters, MD as Consulting Provider (Pulmonology)  Trilby Leaver, MD as Consulting Provider (Cardiology)  Sonia Side, Cornelious Bryant, MD as Consulting Provider (Otolaryngology)  Suszanne Finch, MD (General Surgery)     Medicare 5 Year Plan    The following items were identified as areas of concern during your screening today:  BMI greater than 25 - This is a risk for Heart Attack, Stroke, High Blood Pressure, Diabetes, High Cholesterol and other complications.       The Health Maintenance table below identifies screening tests and immunizations recommended by your health care team:  Health Maintenance   Topic Date Due    Shingles Vaccine (1 of 2) 09/25/1996    Colon Cancer Screening  03/20/2018    DEPRESSION SCREEN YEARLY  01/20/2019    Fall Risk Screening  07/24/2019    Breast Cancer Screening  05/14/2020    Bone density screening  05/14/2021    Osteoporosis Screening  05/15/2023    HIV TESTING OFFERED  Completed    Flu Shot  Completed    Adult Prevnar Vaccination  Completed    Adult Pneumovax Vaccine  Completed    Hepatitis C Screening Offered  Completed     In addition, goals and orders placed to address these recommendations are listed in the "Today's Visit" section.    We wish you the best of health and look forward to seeing you again next year for your Annual Medicare Wellness Visit.     If you have any health care concerns before then, please do not hesitate to contact us.

## 2018-10-22 NOTE — Procedures (Signed)
De Quervain's Injection Procedure Note  [facility]     Procedure: Tennis Must Quervain's tenosynovitis injection  Location: R dorsal compartment of wrist  Indication: Pain    Correct pt (2 identifiers)? Y  Correct procedure/site? Y  Correct materials? Y  Participants? Patient, Latoya Drilling, MD   Time started: 11:40am  Time ended: 11:51am    Consent: Prior to proceeding, the patient was counseled on risks (bleeding, infection, adverse reaction to medication, little relief of pain, tendon injury and possible recurrence, benefits (relieving pain), and alternatives (watching) and agreed to procedure.  Informed consent was signed prior to the procedure being performed.    Procedure: Area of maximal tenderness along dorsal volar area proximal to the radial styloid was identified and marked using a pen.  The area was cleaned with hibaclens, and under sterile conditions, 0.5 ml of  1% lidocaine without epinephrine and 0.31mL Kenalog (40mg /mL) was injected around the tendon at the marked site. Patient tolerated procedure well with minimal pain or discomfort.    Post Procedure Instructions: Patient was counseled on wound care with bandaging and keeping the area clean.  If the area becomes irritated, ulcerated, red, or developed discharge the patient was instructed to call. Rx for thumb spica brace and PT exercises provided to patient.       Latoya Drilling, MD  [facility]  10/22/2018  9:13 PM

## 2018-10-22 NOTE — Progress Notes (Signed)
Due to pandemic event, telehome visit preformed via:             Office Visit, met with patient in person    Today we reviewed and updated [name] smoking status, activities of daily living, depression screen, fall risk, medications and allergies.   I have counseled the patient in the above areas.     Subjective:     Chief Complaint: [name] is a 73 y.o. female here for a/an Subsequent Annual Medicare Visit and Procedure    In general, [name] rates their overall health as:  fair      Patient Care Team:  [name], MD as PCP - General (Primary Care)  Petronaci, Sharene Butters, MD as Consulting Provider (Pulmonology)  Trilby Leaver, MD as Consulting Provider (Cardiology)  Sonia Side Cornelious Bryant, MD as Consulting Provider (Otolaryngology)  Suszanne Finch, MD (General Surgery)     Current Outpatient Medications on File Prior to Visit   Medication Sig Dispense Refill    montelukast (SINGULAIR) 10 MG tablet Take 1 tablet (10 mg total) by mouth nightly 30 tablet 11    gabapentin (GABAPENTIN) 300 MG capsule Take 1 capsule (300 mg total) by mouth 2 times daily 180 capsule 2    pantoprazole (PROTONIX) 20 MG EC tablet Take 1 tablet (20 mg total) by mouth daily 30 min before breakfast Swallow whole. Do not crush, break, or chew. 90 tablet 3    azelastine (ASTELIN) 0.1 % nasal spray 1-2 sprays by Nasal route 2 times daily 90 mL 3    sertraline (ZOLOFT) 50 MG tablet TAKE 1 TABLET BY MOUTH EVERY DAY 90 tablet 3    fluticasone (FLONASE) 50 MCG/ACT nasal spray 2 sprays by Nasal route daily 48 g 1    tiotropium (SPIRIVA RESPIMAT) 2.5 MCG/ACT inhaler Inhale 1 puff into the lungs daily 3 each 3    budesonide-formoterol (SYMBICORT) 160-4.5 MCG/ACT inhaler Inhale 2 puffs into the lungs 2 times daily Shake well before each use. 2 Inhaler 11    atorvastatin (LIPITOR) 40 MG tablet Take 40 mg by mouth daily (with dinner)      aspirin 81 MG tablet Take 81 mg by mouth every morning          loratadine (KLS ALLERCLEAR) 10 MG tablet Take 20 mg by mouth every morning         docusate sodium (COLACE) 100 MG capsule Take 200 mg by mouth every morning         clopidogrel (PLAVIX) 75 MG tablet Take 75 mg by mouth every morning         Spacer/Aero-Holding Chambers (EASIVENT) spacer Use twice daily with Symbicort inhaler. Breathe in slowly. 1 each 2    Non-System Medication Medication/Supply: R thumb spica brace  Directions for Use: wear nightly 1 each 0    ipratropium-albuterol (DUONEB) 0.5-2.'5mg'$  /70m nebulizer solution Take 3 mLs by nebulization every 6 hours 90 mL 1    ipratropium (ATROVENT) 0.06 % nasal spray 2 sprays by Each Nare route 3 times daily 15 mL 0    Misc. Devices (CPAP) machine Download and compliance report 1 each 0    albuterol HFA 108 (90 BASE) MCG/ACT inhaler Inhale 2 puffs into the lungs every 4-6 hours as needed 1 Inhaler 2    sodium chloride (OCEAN) 0.65 % nasal spray 1 spray by Each Nare route as needed for Congestion 45 mL 3    Sodium Chloride-Sodium Bicarb (AYR SALINE NASAL NETI RINSE)  1.57 G PACK 1 packet by Nasal route daily 1 each 0    Cholecalciferol (VITAMIN D3) 5000 UNITS TABS Take 5,000 Units by mouth every morning          No current facility-administered medications on file prior to visit.      Allergies   Allergen Reactions    Morphine Hives     Has tolerated Percocet     Patient Active Problem List    Diagnosis Date Noted    Pulmonary nodule 09/30/2014     Priority: High     CT 02/20/15:  Stable left lower lung 6 mm nodule.     Repeat 12/18 stable, no furhter imaging indicated      CAD (coronary artery disease) 09/30/2014     Priority: Medium     Coronary artery disease   CABG 2013.    PCI to LAD in 8/13.    Coronary angiogram February of 2015 at [facility] after she had chest pain and an abnormal stress test that revealed patent stent and 3/3 grafts patent. The ejection fraction was 60%.    Episode of atypical chest pain 04/2015,  negative troponins, negative nuclear stress test.         Prediabetes 09/30/2014     Priority: Medium    Obesity 09/30/2014     Priority: Medium    Severe Obstructive sleep apnea 11/11/2013     Priority: Medium     Adherent with CPAP      Gastritis 09/30/2014     Priority: Low    Environmental allergies 07/24/2018     Immunotherapy ~2010      Osteopenia 04/03/2018     DXA 10/2016 AP spine -0.2, L neck 1.1, R -1.3, total L -1.0, total R -0.9  05/2018 AP spine -0.2, L neck -2.1, total -1.3      COPD (chronic obstructive pulmonary disease) 03/30/2018     PFTs 2/19: FEV1/FVC 0.65, FEV1 74%      Anxiety and depression 06/12/2017    Aortic stenosis 04/14/2017     Mild on echo 05/2016, plan to repeat in 2019      Health care maintenance 01/01/2017     PCN allergy removed after negative allergy testing.       Restless leg syndrome 09/20/2016    Orbital floor fracture 01/04/2016    Facial fracture due to fall 12/28/2015    Dysthymia 09/14/2015     Past Medical History:   Diagnosis Date    Allergic rhinitis 09/30/2014    Arthritis     Asthma     CAD (coronary artery disease)     DVT (deep venous thrombosis)     post-op after CABG, on anticoagulation for ~2 years    PE (pulmonary embolism)     post-op after CABG, on anticoagulation for ~2 years    Prediabetes     Tubular adenoma 03/2015    colonoscopy      Past Surgical History:   Procedure Laterality Date    ACHILLES TENDON SURGERY      APPENDECTOMY      CHOLECYSTECTOMY      CORONARY ANGIOPLASTY WITH STENT PLACEMENT      CORONARY ARTERY BYPASS GRAFT  2013    triple     CYST REMOVAL      both thumbs    PR REPAIR EYE BLOWOUT,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    Procedure: ORBIT ORIF;  Surgeon: Jannett Celestine, DDS;  Location: [facility] MAIN OR;  Service: OMFS  TONSILLECTOMY AND ADENOIDECTOMY       Family History   Problem Relation Age of Onset    Other Mother         "rare lung disease"    Aneurysm Mother         abdominal aortic aneurysm    Diabetes Type  II Mother     Breast cancer Mother     Blood clots Mother     Stroke Father     Stroke Brother         x2    Thyroid cancer Daughter      Social History     Socioeconomic History    Marital status: Married     Spouse name: Not on file    Number of children: Not on file    Years of education: Not on file    Highest education level: Not on file   Occupational History    Not on file   Tobacco Use    Smoking status: Never Smoker    Smokeless tobacco: Never Used   Substance and Sexual Activity    Alcohol use: No    Drug use: No    Sexual activity: Not on file   Social History Narrative    May 2016        Lives with her husband in an in law suite at her son's house        Three children, all live locally     Five grandchildren     Retired from school food service       Objective:     Vital Signs: BP 122/71 (BP Location: Right arm, Patient Position: Sitting)    Pulse 80    Temp 36.8 C (98.2 F)    Ht 1.68 m (5' 6.14")    Wt 127.6 kg (281 lb 6.4 oz)    LMP  (LMP Unknown)    SpO2 98%    BMI 45.22 kg/m    BMI: Body mass index is 45.22 kg/m.    Vision Screening Results (Welcome visit only):  No exam data present    Depression Screening Results:  Recent Review Flowsheet Data     PHQ Scores 10/22/2018 01/19/2018 04/14/2017 09/20/2016 03/21/2016 04/10/2015    PSQ2 Q1 - Interest/Pleasure N N N Y - Y    PSQ2 Q2 - Down, Depressed, Hopeless N N N N - Y    PHQ Q9 - Better Off Dead - - - 0 0 0    PHQ Calculated Score - - - _0 Opioid Use/DAST- 10 Screening Results:   How many times in the past year have you used an illegal drug or used a prescription medication for nonmedical reasons?: 0 (10/22/2018 11:27 AM)    Activities of Daily Living/Functional Screening Results:  Is the person deaf or does he/she have serious difficulty hearing?: N  Is this person blind or does he/she have serious difficulty seeing even when wearing glasses?: N  *Vision Status: Visual aid   Does this person have serious difficulty walking or  climbing stairs?: Y  *Walks in Home: Independent  *Climbing Stairs: Needs Assistance (uses handrail)  Does this person have difficulty dressing or bathing?: N  *Shopping: Independent  *House Keeping: Independent  *Managing Own Medications: Independent  *Handling Finances: Independent  Difficulty doing errands due to a physicial, mental or emotional condition: No  Difficulty remembering or making decisions due to a physicial, mental or emotional condition:  No      Fall Risk Screening Results:  Have you fallen in the last year?: No  Do you feel you are at risk for falling?: No      Assessment and Plan:      Cognitive Function:  Recall of recent and remote events appears:  Normal      Advanced Care Planning:  was discussed and the paperwork can be found in the scanned media section   Confirmed husband as primary, 3 children as secondary  Full code/trial of intubation    Discussed with pt that s/he at increased risk for severe COVID infection and complication, including respiratory compromise requiring the need for ventilator support. Reviewed the realistic fatal risk of this possible outcome. Recommended aggressive social isolation as above to prevent this, but also discussed his/her wishes if this unfortunate situation were to occur.    We reviewed the high mortality rate of 50-90% if ventilator required support during COVID infection, prolonged intubation/ventilator requirement and complications of prolonged ventilation including need for tracheostomy and ventilator associated pneumonia. Reviewed current visitor restrictions during hospitalization. Encouraged a discussion b/t patient and HCP to review wishes in the event of  COVID infection and respiratory compromise. We discussed that patient will likely be part of the discussion/decision of the initiation of ventilator support, but then HCP will likely be required to voice medical decisions thereafter.       The following health maintenance plan was reviewed with  the patient:    Health Maintenance Topics with due status: Overdue       Topic Date Due    IMM-ZOSTER 09/25/1996    Colon Cancer Screening Other 03/20/2018     Health Maintenance Topics with due status: Not Due       Topic Last Completion Date    DEPRESSION SCREEN YEARLY 01/19/2018    Breast Cancer Screening 05/14/2018    Osteoporosis Screening Other 05/14/2018    Bone Density Screening-Every 3 Years 05/14/2018    Fall Risk Screening 07/24/2018     Health Maintenance Topics with due status: Completed       Topic Last Completion Date    IMM-PREVNAR VACCINE 65 + YRS 09/14/2015    IMM-PNEUMOVAX VACCINE 65 + YRS 09/20/2016    HEPATITIS C SCREENING OFFERED 04/14/2017    HIV TESTING OFFERED 01/19/2018    IMM-INFLUENZA 03/16/2018     This health maintenance schedule, identified risks, a list of orders placed today and patient goals have been provided to [employer] in the after visit summary.     Plan for any concerns identified during screening or risk assessments:   REminded to return to GI for EGD for her dysphagia and get colonoscopy (overdue)  Recommended shingrix at [facility]  10/22/2018  9:16 PM

## 2018-10-22 NOTE — Progress Notes (Deleted)
[facility] AMB COVID-19 TELE Visit Type Add-On (Optional):29499}            SUBJECTIVE    Pt is here to discuss:    Chief Complaint   Patient presents with    Subsequent Annual Medicare Visit    Procedure         1. ***    2. ***     3. ***      PMH / Family Hx / Social Hx  Patient's medications, allergies, problem list, past medical, social histories were reviewed and notable for:    ***    Current Outpatient Medications   Medication Sig    montelukast (SINGULAIR) 10 MG tablet Take 1 tablet (10 mg total) by mouth nightly    gabapentin (GABAPENTIN) 300 MG capsule Take 1 capsule (300 mg total) by mouth 2 times daily    pantoprazole (PROTONIX) 20 MG EC tablet Take 1 tablet (20 mg total) by mouth daily 30 min before breakfast Swallow whole. Do not crush, break, or chew.    azelastine (ASTELIN) 0.1 % nasal spray 1-2 sprays by Nasal route 2 times daily    sertraline (ZOLOFT) 50 MG tablet TAKE 1 TABLET BY MOUTH EVERY DAY    fluticasone (FLONASE) 50 MCG/ACT nasal spray 2 sprays by Nasal route daily    tiotropium (SPIRIVA RESPIMAT) 2.5 MCG/ACT inhaler Inhale 1 puff into the lungs daily    budesonide-formoterol (SYMBICORT) 160-4.5 MCG/ACT inhaler Inhale 2 puffs into the lungs 2 times daily Shake well before each use.    atorvastatin (LIPITOR) 40 MG tablet Take 40 mg by mouth daily (with dinner)    aspirin 81 MG tablet Take 81 mg by mouth every morning       loratadine (KLS ALLERCLEAR) 10 MG tablet Take 20 mg by mouth every morning       docusate sodium (COLACE) 100 MG capsule Take 200 mg by mouth every morning       clopidogrel (PLAVIX) 75 MG tablet Take 75 mg by mouth every morning       Spacer/Aero-Holding Chambers (EASIVENT) spacer Use twice daily with Symbicort inhaler. Breathe in slowly.    Non-System Medication Medication/Supply: R thumb spica brace  Directions for Use: wear nightly    ipratropium-albuterol (DUONEB) 0.5-2.5mg  /4mL nebulizer solution Take 3 mLs by nebulization  every 6 hours    ipratropium (ATROVENT) 0.06 % nasal spray 2 sprays by Each Nare route 3 times daily    Misc. Devices (CPAP) machine Download and compliance report    albuterol HFA 108 (90 BASE) MCG/ACT inhaler Inhale 2 puffs into the lungs every 4-6 hours as needed    sodium chloride (OCEAN) 0.65 % nasal spray 1 spray by Each Nare route as needed for Congestion    Sodium Chloride-Sodium Bicarb (AYR SALINE NASAL NETI RINSE) 1.57 G PACK 1 packet by Nasal route daily    Cholecalciferol (VITAMIN D3) 5000 UNITS TABS Take 5,000 Units by mouth every morning          ROS  ***      OBJECTIVE  Vitals:    10/22/18 1122   BP: 122/71   BP Location: Right arm   Patient Position: Sitting   Pulse: 80   Temp: 36.8 C (98.2 F)   SpO2: 98%   Weight: 127.6 kg (281 lb 6.4 oz)   Height: 1.68 m (5' 6.14")     Body mass index is 45.22 kg/m.      General:  well-appearing {RACE/ETHNICITY:21408} {female/female:29594}, pleasant & conversant, in NAD  Eyes:. PERRLA. EOMI. Conjunctiva pink, without swelling or exudate. Lids normal. ENT: Moist mucous membranes. Normal lips and dentition. B/L TMs grey, without bulging or erythema.   Neck: Trachea midline. Thyroid nontender, without nodularity or enlargement.   Lymph: No cervical or supraclavicular lymphadenopathy.  Lungs: Normal resp effort. CTAB.  No crackles or wheezes.  Cardiac: RRR, no M/R/G. No pedal edema. Pulses 2+ b/l.     Abd: S/NT/ND.  Normoactive BS.  No masses or organomegaly.  No guarding or rebound tenderness.  Psych: AAOx3, normal affect and mood. Insight and judgement intact.           ASSESSMENT & PLAN  1. Preventative health care  ***    2. Dysphagia, unspecified type  ***    3. De Quervain's tenosynovitis, right  ***  - triamcinolone acetonide (KENALOG-40) injection 20 mg        ***    Follow-up: ***      [name], MD  [facility]  10/22/2018  9:17 PM        ______________________

## 2018-10-23 ENCOUNTER — Ambulatory Visit: Payer: Medicare (Managed Care) | Admitting: Primary Care

## 2018-10-23 NOTE — Telephone Encounter (Signed)
The form has been completed. A release was attached. I have faxed the form. A copy was put into scanning.

## 2018-11-06 NOTE — Telephone Encounter (Signed)
We got a request for medical records from the [place] state department of health. The request has been sent to [employer].

## 2018-11-09 ENCOUNTER — Ambulatory Visit: Payer: Medicare (Managed Care) | Admitting: Primary Care

## 2018-11-09 ENCOUNTER — Encounter: Payer: Self-pay | Admitting: Allergy/Immunology/Rheumatology

## 2018-11-09 ENCOUNTER — Ambulatory Visit: Payer: Medicare (Managed Care) | Admitting: Allergy/Immunology/Rheumatology

## 2018-11-09 DIAGNOSIS — J309 Allergic rhinitis, unspecified: Secondary | ICD-10-CM

## 2018-11-09 DIAGNOSIS — R059 Cough, unspecified: Secondary | ICD-10-CM

## 2018-11-09 DIAGNOSIS — R05 Cough: Secondary | ICD-10-CM

## 2018-11-09 DIAGNOSIS — J310 Chronic rhinitis: Secondary | ICD-10-CM

## 2018-11-09 DIAGNOSIS — J45909 Unspecified asthma, uncomplicated: Secondary | ICD-10-CM

## 2018-11-09 DIAGNOSIS — K219 Gastro-esophageal reflux disease without esophagitis: Secondary | ICD-10-CM

## 2018-11-09 NOTE — Patient Instructions (Signed)
--   Continue to take pantoprazole 1 tablet in the morning, about 30 minutes before eating breakfast.     -- Make sure you schedule an appointment to see GI about getting your esophagus stretched.     -- Continue Flonase 2 sprays in each nostril every morning.     -- Continue azelastine 2 sprays in each nostril every evening.     -- Continue Zyrtec (cetirizine) 1 tablet every day.    -- Continue Singulair (montelukast) 1 tablet every day.     -- Let us know if the cough returns or if your allergy symptoms are not controlled.

## 2018-11-09 NOTE — Progress Notes (Signed)
Video Visit     Location of Patient: home    Location of Telemedicine Provider: home office    Other participants in telemedicine encounter and roles:  None    This is an established patient visit.    Reason for visit: Follow-up      HPI  Lisamarie is a 73yo female presenting for follow up regarding a cough and chronic rhinitis symptoms. I last saw Genita 8 weeks ago via telemedicine.     She has been doing pretty good since her last visit. She is only coughing "every once in a while," usually triggered by a tickle in her throat.     She hasn't had any recent episodes of chest tightness or wheezing. Continues to have some shortness of breath with exertion that resolves with rest (this is her baseline). She has not had to use albuterol recently. No changes with inhalers, remains on Symbicort and Spiriva.    She remains on pantoprazole daily, which we started at her last visit. She can't say if the pantoprazole is helping the cough. She is due to go in to have her esophagus stretched. It's been a long time since her last procedure, at least 10 years. She is having some dysphagia with pills.     Still has a stuffy nose, post nasal drip. The post nasal drip will occasionally trigger a cough. She continues with Flonase in the morning and azelastine in the evening with good effect. Remains on cetirizine and montelukast.    She has had no recent infections. She is careful to follow COVID-19 precautions. Is staying home. Her husband has remained out of the hospital, gets visiting nurse services.       ROS  Constitutional: Negative for fever or recent illness  Eyes: Negative for redness, swelling, itching  Ears/Nose: Negative for ear pain, see HPI  Mouth/Throat: Negative for throat pain. + post nasal drip  Cardiovascular: Negative for chest pain  Respiratory: see HPI  Gastrointestinal: + dysphagia, no reflux    Patient's problem list, allergies, and medications were reviewed and updated as appropriate.  Please see the EHR for  full details.    Exam and data reviewed:  Constitutional: Alert, appears well, and in no acute distress  Eyes: No eyelid or periorbital edema  Nose: No significant discharge. No sneezing or nose blowing during visit.  Respiratory: Breathing comfortably on room air. She is speaking clearly in full sentences. No cough or audible wheezing.   Skin: No visible rash on face  Neurologic: Moving all extremities, mental status normal for age  Psychiatric: Normal affect for age      Assessment & Plan:  Shaunika is a 73yo female presenting for evaluation of cough and chronic rhinitis symptoms.     1. Cough   -- Differential includes: GERD, laryngeal hypersensitivity, post nasal drip/chronic rhinitis. Her asthma seems to be controlled.   -- The cough has improved since starting pantoprazole 20mg  daily (taken 30 min before breakfast). Will continue without change.   -- Continue Symbicort and Spiriva as directed by pulmonary.   -- Albuterol for acute symptoms.  -- Schedule visit with GI regarding dysphagia.    2. Chronic rhinitis, likely mixed allergic/non-allergic, with sensitivities to birch tree, grasses, and molds.   -- Continue Flonase 2 sprays in each nostril once daily in the morning.   -- Continue azelastine 2 sprays in each nostril once daily in the evening.  -- Continue cetirizine and montelukast without change.    Follow up:  6-8 months      Consent was obtained from the patient to complete this video visit; including the potential for financial liability.      Thank you for allowing [place] to participate in the care of your patient. Please do not hesitate to contact us with any questions or concerns.     Sincerely,     Juel Burrow, FNP  Allergy & Immunology  [facility]  [address], [address]  [place], [place] [address]    [place], [place]  [place], [place] [address]  Clinic phone: [phone]

## 2018-11-12 NOTE — Telephone Encounter (Signed)
This was processed by [facility] on 11/10/18 reference number [id number] A copy was placed into scanning.

## 2018-12-10 ENCOUNTER — Telehealth: Payer: Self-pay

## 2018-12-10 NOTE — Telephone Encounter (Signed)
Received   CDPAS  form from [facility]  For nurses to review and Doctor to Fill out and sign Placed in nurses Bin. Please fax completed form to  [phone] .

## 2018-12-15 NOTE — Telephone Encounter (Signed)
Placed in providers bin for completion

## 2018-12-16 ENCOUNTER — Encounter: Payer: Self-pay | Admitting: Gastroenterology

## 2018-12-16 NOTE — Telephone Encounter (Signed)
The form has been faxed and placed into scanning.

## 2018-12-31 ENCOUNTER — Encounter: Payer: Self-pay | Admitting: Gastroenterology

## 2019-01-21 ENCOUNTER — Encounter: Payer: Self-pay | Admitting: Gastroenterology

## 2019-01-22 ENCOUNTER — Ambulatory Visit: Payer: Medicare (Managed Care) | Admitting: Pulmonary and Critical Care Medicine

## 2019-01-27 NOTE — Progress Notes (Signed)
Pre-Visit Planning    Health Maintenance Due   Topic Date Due    HIV Screening USPSTF/[place]  09/26/1958    IMM-ZOSTER (1 of 2) 09/25/1996    Colon Cancer Screening Other  03/20/2018    IMM-INFLUENZA (1) 01/12/2019       Notes:  - Colonoscopy - DUE COMPLETED BY: Birder Robson      Completed on 01/27/19 by Ollen Gross

## 2019-02-03 ENCOUNTER — Ambulatory Visit: Payer: Medicare (Managed Care) | Admitting: Primary Care

## 2019-02-03 VITALS — BP 124/72 | HR 81 | Temp 97.4°F | Ht 66.14 in | Wt 282.6 lb

## 2019-02-03 DIAGNOSIS — Z23 Encounter for immunization: Secondary | ICD-10-CM

## 2019-02-03 DIAGNOSIS — Z0000 Encounter for general adult medical examination without abnormal findings: Secondary | ICD-10-CM

## 2019-02-03 DIAGNOSIS — S0230XA Fracture of orbital floor, unspecified side, initial encounter for closed fracture: Secondary | ICD-10-CM

## 2019-02-03 MED ORDER — GABAPENTIN 300 MG PO CAPSULE *I*
300.0000 mg | ORAL_CAPSULE | Freq: Three times a day (TID) | ORAL | 2 refills | Status: DC
Start: 2019-02-03 — End: 2020-03-12

## 2019-02-03 NOTE — Patient Instructions (Signed)
1. Increase gabapentin to 3 times daily  2. Increase water intake  3. Increase exercise and make healthy diet choices.  4. Follow up in 1 year for CPE

## 2019-02-03 NOTE — H&P (Signed)
[facility]: Adult Physical    HPI  The patient reports that she continues to have numbness in her upper lip on the right side.  She notes that the gabapentin helped somewhat but maybe increasing the dose would help a little more.  She notes that there are times when it does interfere in her ability to eat or drink certain things due to sensitivity or hot or cold exposure, other than that she doesn't have any issues, and reports more of an annoyance than anything.    Patient Active Problem List   Diagnosis Code    Severe Obstructive sleep apnea G47.33    Pulmonary nodule R91.1    CAD (coronary artery disease) I25.10    Prediabetes R73.03    Gastritis K29.70    Obesity E66.9    Dysthymia F34.1    Facial fracture due to fall S02.92XA, [id number]    Orbital floor fracture S02.30XA    Restless leg syndrome G25.81    Health care maintenance Z00.00    Aortic stenosis I35.0    Anxiety and depression F41.9, F32.9    COPD (chronic obstructive pulmonary disease) J44.9    Osteopenia M85.80    Environmental allergies Z91.09      Past Medical History:   Diagnosis Date    Allergic rhinitis 09/30/2014    Arthritis     Asthma     CAD (coronary artery disease)     DVT (deep venous thrombosis)     post-op after CABG, on anticoagulation for ~2 years    PE (pulmonary embolism)     post-op after CABG, on anticoagulation for ~2 years    Prediabetes     Tubular adenoma 03/2015    colonoscopy       Past Surgical History:   Procedure Laterality Date    ACHILLES TENDON SURGERY      APPENDECTOMY      CHOLECYSTECTOMY      CORONARY ANGIOPLASTY WITH STENT PLACEMENT      CORONARY ARTERY BYPASS GRAFT  2013    triple     CYST REMOVAL      both thumbs    PR REPAIR EYE BLOWOUT,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    Procedure: ORBIT ORIF;  Surgeon: Jannett Celestine, DDS;  Location: [facility];  Service: OMFS    TONSILLECTOMY AND ADENOIDECTOMY       Current Outpatient Medications   Medication    cetirizine  (ZYRTEC) 10 MG tablet    montelukast (SINGULAIR) 10 MG tablet    gabapentin (GABAPENTIN) 300 MG capsule    pantoprazole (PROTONIX) 20 MG EC tablet    azelastine (ASTELIN) 0.1 % nasal spray    sertraline (ZOLOFT) 50 MG tablet    fluticasone (FLONASE) 50 MCG/ACT nasal spray    tiotropium (SPIRIVA RESPIMAT) 2.5 MCG/ACT inhaler    budesonide-formoterol (SYMBICORT) 160-4.5 MCG/ACT inhaler    atorvastatin (LIPITOR) 40 MG tablet    docusate sodium (COLACE) 100 MG capsule    clopidogrel (PLAVIX) 75 MG tablet    Spacer/Aero-Holding Chambers (EASIVENT) spacer    Non-System Medication    ipratropium-albuterol (DUONEB) 0.5-2.5mg  /54mL nebulizer solution    ipratropium (ATROVENT) 0.06 % nasal spray    Misc. Devices (CPAP) machine    albuterol HFA 108 (90 BASE) MCG/ACT inhaler    sodium chloride (OCEAN) 0.65 % nasal spray    Sodium Chloride-Sodium Bicarb (AYR SALINE NASAL NETI RINSE) 1.57 G PACK    Cholecalciferol (VITAMIN D3) 5000 UNITS TABS    aspirin 81 MG tablet  No current facility-administered medications for this visit.      Allergies   Allergen Reactions    Morphine Hives     Has tolerated Percocet     Family History   Problem Relation Age of Onset    Other Mother         "rare lung disease"    Aneurysm Mother         abdominal aortic aneurysm    Diabetes Type II Mother     Breast cancer Mother     Blood clots Mother     Stroke Father     Stroke Brother         x2    Thyroid cancer Daughter      Social History     Socioeconomic History    Marital status: Married     Spouse name: Not on file    Number of children: Not on file    Years of education: Not on file    Highest education level: Not on file   Occupational History    Not on file   Tobacco Use    Smoking status: Never Smoker    Smokeless tobacco: Never Used   Substance and Sexual Activity    Alcohol use: No    Drug use: No    Sexual activity: Not on file   Social History Narrative    May 2016        Lives with her husband in  an in law suite at her son's house        Three children, all live locally     Five grandchildren     Retired from Information systems manager  Wears seatbelt? yes  Smoke detectors? yes  Carbon monoxide detectors? yes  Uses sunscreen? yes  Adequate housing? yes  Feel safe at home? yes  Occupation: Retired    Mudlogger care: No  Eye exam:Yes and due  Hearing screening:Yes and ringing in the ears.  Lipid panel:Yes  Diabetes screening:Yes  Colon cancer screening:Yes and need to schedule  Mammography:Yes and normal in 2020  DEXA scan:Yes and completed in 05/2018  Pap smear:N/A  Exercise:  No exercise  Diet: used to do weight watchers, terrible, haven't been watching what she eats.  No ETOH, diet soda daily.  Sleep: denies any difficulty falling or staying asleep, averaging about 6-8 hours.  Continues to wear CPAP nightly.      ROS  GENERAL: Appetite normal.  Denies fevers, night sweats, fatigue, or weight loss   HEENT: No vision changes, ear pain, congestion, sore throat, swollen glands  CV: No chest pain, palpitations, shortness of breath or peripheral edema   RESPIRATORY: No cough, wheezing or dyspnea   GI: No heartburn, abdominal pain, nausea, vomiting, diarrhea, constipation, hemorrhoids, blood in stool.  GU: No dysuria, urgency, frequency, hematuria, or incontinence   NEURO: No dizziness, weakness, MS changes, no motor weakness, no sensory changes   SKIN: No rashes, lumps, itching, or color changes  HEMATOLOGIC: No easy bruising/bleeding  ENDOCRINE: No heat/cold intolerance, frequent urination, excessive thirst  PSYCHIATRIC: No depression or memory loss    PHYSICAL EXAM  BP 124/72    Pulse 81    Temp 36.3 C (97.4 F)    Ht 1.68 m (5' 6.14") Comment: actual   Wt 128.2 kg (282 lb 9.6 oz)    LMP  (LMP Unknown)    SpO2 98%  BMI 45.42 kg/m   GENERAL: A&Ox3, pleasant, cooperative, well groomed, appropriately dressed, actively engaged in encounter, NAD.  HEAD: atraumatic, normocephalic  EYES:  PERRL, EOM grossly intact, anicteric sclera, pink conjuntiva   ENT: TMs and canals clear bilat, nasal mucosa clear, oropharynx moist with no oropharyngeal edema or erythema present; no oral lesions, exudate, or tonsillar enlargement. No frontal or maxillary sinus discomfort or pain with palpation.   NECK: supple, no lymphadenopathy, no thyromegaly, no palpable thyroid nodules  CARDIOVASCULAR: RR, S1 S2, no murmers, gallops, rubs.  RESPIRATORY: RRR, unlabored. Breath sounds clear to auscultation bilaterally, no adventitious breath sounds, full and symmetrical lung expansion.  ABDOMEN: Abdomen non-distended,soft, no palpable masses, BS active x 4, negative tenderness, rebound, or guarding, no CVAT   SKIN: pink, warm and dry, no rash, lesions or scars noted. Nails: no clubbing  EXTREMITIES: Warm, grossly normal strength 5/5, sensation, movement, and reflexes 2+, normal gait,no edema.  NEURO: CN1-12 intact   PSYCH: AAOx3, normal affect and mood. Insight and judgement intact.    ASSESSMENT   73 y.o. year old       PLAN  Repeat physical exam in 1 years    Immunizations:   Immunization History   Administered Date(s) Administered    Influenza Quadrivalent 0.25mL prefilled syringe/single dose vial(FluLaval,Fluzone,Afluria,Fluarix) 03/16/2018    Influenza, High Dose 05/25/2014, 02/28/2015, 03/21/2016, 02/28/2017    Pneumococcal 13-valent Conjugate (Prevnar 13) 09/14/2015    Pneumococcal Polysaccharide (Pneumovax) 09/20/2016       Weight management: Body mass index is 45.42 kg/m.Marland Kitchen    Discussed the patient's BMI with her.   Anticipatory/preventative guidance  Diet/body weight discussed: Continue to make healthy diet choices and increase exercise/activity  Zostavax discussed esp those over age 62: not interested currently.  Depression screening evaluation performed today: Over the last two weeks, have you often been bothered by feeling down, depressed or hopeless?: N (02/03/2019  2:58 PM)  Over the last two weeks, have you  often had little interest or pleasure in doing things?: N (02/03/2019  2:58 PM)  Cardiac risk factor modification discussed: Yes  Diabetes prevention and screening discussed: Yes    1. Annual physical exam  2. Flu vaccine need  immunized  - Flu vaccine, High Dose Quad, greater than or equal to 78 yo, preservative free  Pt denies current illness/fever, recent vaccine in last 4 weeks, allergy to egg or flu vaccine component, h/o Guillain-Barre or current pregnancy. I have educated the patient and/or parent/guardian/designee about each component in all the immunizations and toxoids that are being given today, have reviewed potential vaccine side effects and have answered their questions about the vaccinations.     3. Orbital floor fracture    Likely this is the reasoning behind her persistent numbness of the upper lip, damage from the fall and fracture.  Will trial an increase in gabapentin to 3 times daily. To see if there is any improvement in that.      Signed: Daine Floras, NP on 02/03/2019 at 2:05 PM

## 2019-03-23 ENCOUNTER — Encounter: Payer: Self-pay | Admitting: Gastroenterology

## 2019-03-30 ENCOUNTER — Encounter: Payer: Self-pay | Admitting: Gastroenterology

## 2019-06-01 ENCOUNTER — Other Ambulatory Visit: Payer: Self-pay

## 2019-06-01 MED ORDER — ALBUTEROL SULFATE HFA 108 (90 BASE) MCG/ACT IN AERS *I*
2.0000 | INHALATION_SPRAY | RESPIRATORY_TRACT | 6 refills | Status: DC | PRN
Start: 2019-06-01 — End: 2020-09-27

## 2019-06-01 NOTE — Telephone Encounter (Signed)
Last appt: 02/03/2019     Next appt:  08/06/2019        No flowsheet data found.

## 2019-06-02 ENCOUNTER — Other Ambulatory Visit: Payer: Self-pay | Admitting: Gastroenterology

## 2019-06-04 ENCOUNTER — Other Ambulatory Visit: Payer: Self-pay | Admitting: Pulmonary Disease

## 2019-06-04 DIAGNOSIS — Z23 Encounter for immunization: Secondary | ICD-10-CM

## 2019-06-07 ENCOUNTER — Telehealth: Payer: Self-pay

## 2019-06-07 DIAGNOSIS — F419 Anxiety disorder, unspecified: Secondary | ICD-10-CM

## 2019-06-07 MED ORDER — SERTRALINE HCL 50 MG PO TABS *I*
50.0000 mg | ORAL_TABLET | Freq: Every day | ORAL | 3 refills | Status: DC
Start: 2019-06-07 — End: 2019-09-06

## 2019-06-07 NOTE — Telephone Encounter (Signed)
Please call and verify that pt's med was filled  Per pt and her daughter, there may be an issue that she's technically not due for refill.   She just lost her husband this weekend, please assure she can get this filled    [name], MD  [facility]  06/07/2019  6:31 PM

## 2019-06-07 NOTE — Telephone Encounter (Signed)
Last appt: 02/03/2019     Next appt:  08/06/2019        No flowsheet data found.    Please complete I-stop and forward to the doctor.

## 2019-06-07 NOTE — Telephone Encounter (Signed)
Confidential Drug Report  Search Terms: Avriel Wujcik, [birth year]   Search Date: 06/07/2019 12:45:04 PM   Searching on behalf of: [id number] - [name]   The Drug Utilization Report below displays all of the controlled substance prescriptions, if any, that your patient has filled in the last twelve months. The information displayed on this report is compiled from pharmacy submissions to the Department, and accurately reflects the information as submitted by the pharmacies.  This report was requested by: Maretta Bees   Reference #: [id number]   There are no results for the search terms that you entered.

## 2019-06-08 NOTE — Telephone Encounter (Signed)
Spoke with pharmacy, patient picked up script

## 2019-06-11 ENCOUNTER — Telehealth: Payer: Self-pay

## 2019-06-11 NOTE — Telephone Encounter (Signed)
Writer spoke with Briyana, her husband just passed away and would like to reschedule her appointment for 2/8.Elonda Husky, RN

## 2019-06-21 ENCOUNTER — Other Ambulatory Visit: Payer: Medicare (Managed Care)

## 2019-06-21 ENCOUNTER — Ambulatory Visit: Payer: Medicare (Managed Care) | Admitting: Pulmonology

## 2019-07-13 ENCOUNTER — Encounter: Payer: Medicare (Managed Care) | Admitting: Primary Care

## 2019-07-13 DIAGNOSIS — I2510 Atherosclerotic heart disease of native coronary artery without angina pectoris: Secondary | ICD-10-CM

## 2019-07-21 ENCOUNTER — Encounter: Payer: Self-pay | Admitting: Gastroenterology

## 2019-07-23 ENCOUNTER — Encounter: Payer: Self-pay | Admitting: Gastroenterology

## 2019-07-29 NOTE — Progress Notes (Signed)
Pre-Visit Planning    Health Maintenance Due   Topic Date Due    HIV Screening USPSTF/[place]  09/26/1958    IMM-ZOSTER (1 of 2) 09/25/1996    Colon Cancer Screening Other  03/20/2018       Notes:  - Colonoscopy - Birder Robson     -  Completed on 07/29/19 by Ollen Gross

## 2019-08-02 ENCOUNTER — Telehealth: Payer: Self-pay

## 2019-08-02 DIAGNOSIS — Z01812 Encounter for preprocedural laboratory examination: Secondary | ICD-10-CM

## 2019-08-02 NOTE — Telephone Encounter (Signed)
Writer spoke with [place] regarding Covid screen prior to appointment on 3/29. Information provided for [place] to be screened between 3/24 through 3/25.  Instructed to call clinic if unable to be screened so appointment can be rescheduled.   Dyke Maes, RN

## 2019-08-04 ENCOUNTER — Ambulatory Visit: Payer: Medicare (Managed Care) | Attending: Pulmonology

## 2019-08-04 DIAGNOSIS — Z20822 Contact with and (suspected) exposure to covid-19: Secondary | ICD-10-CM | POA: Insufficient documentation

## 2019-08-04 DIAGNOSIS — Z20828 Contact with and (suspected) exposure to other viral communicable diseases: Secondary | ICD-10-CM | POA: Insufficient documentation

## 2019-08-04 DIAGNOSIS — Z01812 Encounter for preprocedural laboratory examination: Secondary | ICD-10-CM | POA: Insufficient documentation

## 2019-08-05 LAB — COVID-19 PCR

## 2019-08-05 LAB — COVID-19 NAAT (PCR): COVID-19 NAAT (PCR): NEGATIVE

## 2019-08-06 ENCOUNTER — Encounter: Payer: Self-pay | Admitting: Primary Care

## 2019-08-06 ENCOUNTER — Ambulatory Visit: Payer: Medicare (Managed Care) | Admitting: Primary Care

## 2019-08-06 VITALS — BP 138/74 | HR 91 | Temp 97.5°F | Ht 66.14 in | Wt 281.0 lb

## 2019-08-06 DIAGNOSIS — G2581 Restless legs syndrome: Secondary | ICD-10-CM

## 2019-08-06 DIAGNOSIS — Z634 Disappearance and death of family member: Secondary | ICD-10-CM

## 2019-08-06 DIAGNOSIS — E669 Obesity, unspecified: Secondary | ICD-10-CM

## 2019-08-06 DIAGNOSIS — J310 Chronic rhinitis: Secondary | ICD-10-CM

## 2019-08-06 DIAGNOSIS — R1310 Dysphagia, unspecified: Secondary | ICD-10-CM

## 2019-08-06 DIAGNOSIS — I2510 Atherosclerotic heart disease of native coronary artery without angina pectoris: Secondary | ICD-10-CM

## 2019-08-06 MED ORDER — FEXOFENADINE HCL 180 MG PO TABS *I*
180.0000 mg | ORAL_TABLET | Freq: Every day | ORAL | 1 refills | Status: DC
Start: 2019-08-06 — End: 2019-09-06

## 2019-08-06 NOTE — Progress Notes (Signed)
[facility]          SUBJECTIVE    Pt is here to discuss:    Chief Complaint   Patient presents with    Peripheral Neuropathy     patient here for follow     Grief     patient here for follow up          1. Restless leg vs neuropathy - persistent. Feels best when her feet are elevated. As soon as she gets up in the morning and hangs her feet over, can feel a cold sensation. No wounds.  Improved with gabapentin but still quite bothersome. Usually takes two doses daily, forgets third dose.     2. Obesity - had gained 25lb or so, joined [employer] 6 weeks ago and already has lost 15lb.      3. Allergies - recurrent, severe. Uses azelastine spray and flonase, zyrtec and singulair. Asking for anything else. H/o immunotherapy    4. Dysphagia - admits to some difficulty swallowing pills. H/o stretching procedure years ago prior to moving to [place]. Also due for colonoscopy.     5. Grief- thinks she's doing as well as can be expected after husband passed away 62mo ago. Got into senior living facility and planning on moving in a month or so. Ready to prioritize her health now too; put a lot of things on the back burner due to her husband's illnesses and frequent hospitalizations over the last year.       PMH / Family Hx / Social Hx  Patient's medications, allergies, problem list, past medical, social histories were reviewed and notable for:    Current Outpatient Medications   Medication Sig    sertraline (ZOLOFT) 50 MG tablet Take 1 tablet (50 mg total) by mouth daily    gabapentin (GABAPENTIN) 300 MG capsule Take 1 capsule (300 mg total) by mouth 3 times daily    cetirizine (ZYRTEC) 10 MG tablet Take 10 mg by mouth daily    montelukast (SINGULAIR) 10 MG tablet Take 1 tablet (10 mg total) by mouth nightly    pantoprazole (PROTONIX) 20 MG EC tablet Take 1 tablet (20 mg total) by mouth daily 30 min before breakfast Swallow whole. Do not crush, break, or chew.    azelastine (ASTELIN) 0.1 % nasal spray 1-2 sprays  by Nasal route 2 times daily    fluticasone (FLONASE) 50 MCG/ACT nasal spray 2 sprays by Nasal route daily    tiotropium (SPIRIVA RESPIMAT) 2.5 MCG/ACT inhaler Inhale 1 puff into the lungs daily    budesonide-formoterol (SYMBICORT) 160-4.5 MCG/ACT inhaler Inhale 2 puffs into the lungs 2 times daily Shake well before each use.    ipratropium (ATROVENT) 0.06 % nasal spray 2 sprays by Each Nare route 3 times daily    atorvastatin (LIPITOR) 40 MG tablet Take 40 mg by mouth daily (with dinner)    Cholecalciferol (VITAMIN D3) 5000 UNITS TABS Take 5,000 Units by mouth every morning       docusate sodium (COLACE) 100 MG capsule Take 200 mg by mouth every morning       clopidogrel (PLAVIX) 75 MG tablet Take 75 mg by mouth every morning       albuterol HFA (PROVENTIL, VENTOLIN, PROAIR HFA) 108 (90 Base) MCG/ACT inhaler Inhale 2 puffs into the lungs every 4-6 hours as needed    Spacer/Aero-Holding Chambers (EASIVENT) spacer Use twice daily with Symbicort inhaler. Breathe in slowly.    Non-System Medication Medication/Supply: R thumb spica brace  Directions for Use: wear nightly    ipratropium-albuterol (DUONEB) 0.5-2.5mg  /60mL nebulizer solution Take 3 mLs by nebulization every 6 hours    Misc. Devices (CPAP) machine Download and compliance report    sodium chloride (OCEAN) 0.65 % nasal spray 1 spray by Each Nare route as needed for Congestion    Sodium Chloride-Sodium Bicarb (AYR SALINE NASAL NETI RINSE) 1.57 G PACK 1 packet by Nasal route daily       ROS  Denies SOB, cough, chest pain  Recurrent wrist pain, mild. S/p dequervain's injection 8/19 and 6/20      OBJECTIVE  Vitals:    08/06/19 1141   BP: 138/74   BP Location: Right arm   Patient Position: Sitting   Pulse: 91   Temp: 36.4 C (97.5 F)   SpO2: 98%   Weight: 127.5 kg (281 lb)   Height: 1.68 m (5' 6.14")     Body mass index is 45.16 kg/m.      General: well-appearing Caucasian female, pleasant & conversant, in NAD  Lungs: Normal resp effort. CTAB.  No  crackles or wheezes.  Cardiac: RRR, no M/R/G. No pedal edema.   Feet: No evidence of injury, infection or ischemia b/l. DP and TP pulses 2+ b/l. Feet warm, well perfused    Psych: AAOx3, normal affect and mood. Insight and judgement intact.           ASSESSMENT & PLAN   1. Restless leg syndrome  Recommended dose increase of gabapentin at night- 300mg  qAM, 600mg  qhs. Will update CBC to assure no macro or microcytosis.   - CBC; Future    2. Obesity  Congratulated on [employer] involvement and interval weight loss.      3. Rhinitis, unspecified type  Recommended alternative H2 antag; changed from zyrtec to allegra. Monitor for improvement if she moves into alternative housing. Urged to consider air purifier, fewer carpets. F/u with allergist if persistence/worsening.  - fexofenadine (ALLEGRA) 180 MG tablet; Take 1 tablet (180 mg total) by mouth daily  Dispense: 90 tablet; Refill: 1    4. Dysphagia, unspecified type  Encouraged pt to schedule FUV with GI. Reminded she's overdue for colonoscopy    5. Bereavement  Managing well. Declines dose change of sertraline    6. Coronary artery disease, angina presence unspecified, unspecified vessel or lesion type, unspecified whether native or transplanted heart  Updated labs requested  - Comprehensive metabolic panel; Future  - Hemoglobin A1c; Future  - Lipid Panel (Reflex to Direct  LDL if Triglycerides more than 400); Future      Has rec'd 1 out of 2 doses of COVID.    Follow-up: 3-75mo for AWV/dequervain's injection      [name], MD  [facility]  08/06/2019  11:57 AM        ______________________

## 2019-08-09 ENCOUNTER — Ambulatory Visit: Payer: Medicare (Managed Care) | Attending: Pulmonology

## 2019-08-09 ENCOUNTER — Ambulatory Visit: Payer: Medicare (Managed Care) | Admitting: Pulmonology

## 2019-08-09 VITALS — BP 138/72 | HR 82 | Temp 96.4°F | Resp 18 | Ht 65.75 in | Wt 268.2 lb

## 2019-08-09 DIAGNOSIS — J449 Chronic obstructive pulmonary disease, unspecified: Secondary | ICD-10-CM

## 2019-08-09 DIAGNOSIS — G4733 Obstructive sleep apnea (adult) (pediatric): Secondary | ICD-10-CM

## 2019-08-09 LAB — PULMONARY FUNCTION TEST
FEV1 (L): 1.45 L
FEV1 Z-score: -2.08
FEV1/FVC Z-score: -1.53
FEV1/FVC: 65 %
FVC (L): 2.22 L
FVC Z-score: -1.43

## 2019-08-09 MED ORDER — FLUTICASONE-UMECLIDIN-VILANT 200-62.5-25 MCG/INH IN AEPB *I*
1.0000 | INHALATION_SPRAY | Freq: Every day | RESPIRATORY_TRACT | 5 refills | Status: DC
Start: 2019-08-09 — End: 2020-05-31

## 2019-08-09 NOTE — Patient Instructions (Addendum)
Start taking the Trelegy one puff once daily.  Rinse and spit after use.    The Trelegy replaces both the Symbicort and Spiriva.  If you don't like it let me know.    Continue albuterol or the Duoneb when needed.    Continue Singulair.    Continue CPAP.    Continue weight loss efforts.      3 month follow up with breathing tests.

## 2019-08-09 NOTE — Progress Notes (Signed)
[name] of [facility]      Dear Dr. [name], MD,    I had the pleasure of seeing [name] on 08/09/2019 at the [facility] for re-evaluation of shortness of breath, morbid obesity, asthma, and OSA.  Please allow me to review the past medical history for my records.    Past Medical History:   Diagnosis Date    Allergic rhinitis 09/30/2014    Arthritis     Asthma     CAD (coronary artery disease)     DVT (deep venous thrombosis)     post-op after CABG, on anticoagulation for ~2 years    PE (pulmonary embolism)     post-op after CABG, on anticoagulation for ~2 years    Prediabetes     Tubular adenoma 03/2015    colonoscopy       Interim history: She denies any intercurrent respiratory infections.  Her husband who was well known to our office unfortunately passed away in 06/18/22.  She has been adjusting to life on her own after being with Wille Glaser since she was 74 years old.  She's in the middle of moving to a senior living apartment which she feels will be helpful for her breathing - her old place had a damp basement and mold was an issue.  She put on some weight but has recently lost 16lbs since she restarted [employer].  She continues on Symbicort and Spiriva but finds there are days she misses her evening doses.  She has not used the albuterol or neb very frequently.  She continues on CPAP nightly and feels rested most of the time.  No nighttime respiratory symptoms.  She reports allergies are well controlled.  No fevers, chills, chest pain.    A complete medication list was reviewed and reconciled.    Pertinent pulmonary medications include: Symbicort 160, Spiriva respimat, albuterol prn, duoneb prn, CPAP    Allergic reactions were reviewed and updated.     PHYSICAL EXAM & DATA   Blood pressure 138/72, pulse 82, temperature 35.8 C (96.4 F), temperature source Temporal, resp. rate 18, height 1.67  m (5' 5.75"), weight 121.7 kg (268 lb 3.2 oz), SpO2 96 %. on RA    Gen: NAD, speaking in full sentences.  HEENT: MMM, no oropharyngeal lesions, a non-deviated trachea, no stridor, and no cervical or supraclavicular lymphadenopathy.    Resp: No increased WOB, diminished BS but otherwise CTAB without crackles or wheezes.    Card: S1S2, reg, no pedal edema.    Abd: Soft  MSK: No cyanosis, clubbing, or joint tenderness.    Neuro: No focal motor or sensory defects.    Integument: No significant lesions or rashes.     Pulmonary function tests:  Spirometry today reveals an FEV1 of 1.45 liters (64% of predicted), an FVC of 2.22 liters (76% of predicted) and a low-normal FEV1/FVC ratio of 65%.  These results and the appearance of her flow loops support obstruction.  Compared to her last testing FEV1 has decreased by 16% and FVC has decreased by 10%.     Impression:  [name] is a 74 y.o. female with dyspnea/asthma, morbid obesity, and OSA following up.  She reports relatively stable respiratory symptoms but her spirometry has declined some and she misses the evening doses of her inhalers not infrequently.  We'll trial transitioning her to Trelegy for ease of once daily dosing.  I've encouraged her to continue her  weight loss efforts and to continue with the CPAP.  We'll plan to see her back in about 3 months to repeat her breathing tests on the new inhaler.      My recommendations are:  - Stop Symbicort, Spiriva.  - Start Trelegy  - Weight loss  - Continue CPAP  - RTC 70mo    Thank you for allowing me to remain involved in the care of [name]. Please don't hesitate to contact me with any questions or concerns.     Sincerely,   Youlanda Roys, NP  2:30 PM  08/09/2019

## 2019-08-11 ENCOUNTER — Other Ambulatory Visit
Admission: RE | Admit: 2019-08-11 | Discharge: 2019-08-11 | Disposition: A | Discharge status: Home / Self Care | Payer: Medicare (Managed Care) | Source: Ambulatory Visit | Attending: Primary Care | Admitting: Primary Care

## 2019-08-11 DIAGNOSIS — G2581 Restless legs syndrome: Secondary | ICD-10-CM | POA: Insufficient documentation

## 2019-08-11 DIAGNOSIS — I2510 Atherosclerotic heart disease of native coronary artery without angina pectoris: Secondary | ICD-10-CM

## 2019-08-11 LAB — COMPREHENSIVE METABOLIC PANEL
ALT: 23 U/L (ref 0–35)
AST: 35 U/L (ref 0–35)
Albumin: 3.8 g/dL (ref 3.5–5.2)
Alk Phos: 129 U/L — ABNORMAL HIGH (ref 35–105)
Anion Gap: 8 (ref 7–16)
Bilirubin,Total: 0.4 mg/dL (ref 0.0–1.2)
CO2: 30 mmol/L — ABNORMAL HIGH (ref 20–28)
Calcium: 9.5 mg/dL (ref 8.6–10.2)
Chloride: 105 mmol/L (ref 96–108)
Creatinine: 0.77 mg/dL (ref 0.51–0.95)
GFR,Black: 89 *
GFR,Caucasian: 77 *
Glucose: 111 mg/dL — ABNORMAL HIGH (ref 60–99)
Lab: 14 mg/dL (ref 6–20)
Potassium: 4.2 mmol/L (ref 3.3–5.1)
Sodium: 143 mmol/L (ref 133–145)
Total Protein: 6.4 g/dL (ref 6.3–7.7)

## 2019-08-11 LAB — CBC
Hematocrit: 40 % (ref 34–45)
Hemoglobin: 12.6 g/dL (ref 11.2–15.7)
MCH: 29 pg (ref 26–32)
MCHC: 32 g/dL (ref 32–36)
MCV: 90 fL (ref 79–95)
Platelets: 268 10*3/uL (ref 160–370)
RBC: 4.4 MIL/uL (ref 3.9–5.2)
RDW: 13.2 % (ref 11.7–14.4)
WBC: 6.8 10*3/uL (ref 4.0–10.0)

## 2019-08-11 LAB — LIPID PANEL
Chol/HDL Ratio: 3.7
Cholesterol: 132 mg/dL
HDL: 36 mg/dL — ABNORMAL LOW (ref 40–60)
LDL Calculated: 68 mg/dL
Non HDL Cholesterol: 96 mg/dL
Triglycerides: 140 mg/dL

## 2019-08-12 ENCOUNTER — Telehealth: Payer: Self-pay

## 2019-08-12 LAB — HEMOGLOBIN A1C: Hemoglobin A1C: 5.9 % — ABNORMAL HIGH

## 2019-08-12 NOTE — Telephone Encounter (Signed)
Received from Pharmacy Prior authorization    Prescription Information:     Rx: fexofenadine (ALLEGRA) 180 MG TABLET  Qty/Day Supply: 90 Tablet   Sig: Take 1 tablet (180 mg total) by mouth daily  Disp-90 tablet, R-1, Normal

## 2019-08-12 NOTE — Telephone Encounter (Signed)
Per Insurance this is a plan exclusion

## 2019-08-13 NOTE — Telephone Encounter (Signed)
Please see below.

## 2019-08-13 NOTE — Telephone Encounter (Signed)
Is it b/c of the dose?    Is claritin or desloratidine covered?    [name], MD  [facility]  08/13/2019  9:42 PM

## 2019-08-16 ENCOUNTER — Telehealth: Payer: Self-pay

## 2019-08-16 NOTE — Telephone Encounter (Signed)
Those are considered OTC also

## 2019-08-16 NOTE — Telephone Encounter (Signed)
Please let pt know she will have to purchase her allergy med OTC based on her insurance company    I would recommend she switch to allegra, as we discussed at her appt. Insurance would not cover the prescription I sent however    [name], MD  [facility]  08/16/2019  2:54 PM

## 2019-08-16 NOTE — Telephone Encounter (Signed)
Writer placed call to patient to communicate provider notes. Patient is going to [employer], and contact office back if needed.

## 2019-08-16 NOTE — Telephone Encounter (Signed)
Please see the message below. Please advise.

## 2019-08-18 NOTE — Telephone Encounter (Signed)
Received from Pharmacy Prior authorization    Prescription Information:     Rx: Fexofenadine HCl  Qty/Day Supply:90 tabs  Sig: Take 1 tablet (180 mg total) by mouth daily

## 2019-08-19 NOTE — Telephone Encounter (Signed)
Fexofenadine - This medication is excluded from the patient's benefit plan.

## 2019-08-19 NOTE — Telephone Encounter (Signed)
Please see the message below. Please advise.

## 2019-08-19 NOTE — Telephone Encounter (Signed)
Already addressed 4/1  Pt aware  No further action needed    [name], MD  [facility]  08/19/2019  11:22 AM

## 2019-08-30 ENCOUNTER — Encounter: Payer: Self-pay | Admitting: Primary Care

## 2019-08-30 NOTE — Telephone Encounter (Signed)
Ok to schedule next Monday at 11:40am    [name], MD  [facility]  08/30/2019  10:17 AM

## 2019-08-30 NOTE — Telephone Encounter (Signed)
The patient has been scheduled

## 2019-08-30 NOTE — Telephone Encounter (Signed)
Writer placed call to patient who states that she would like to schedule an appointment with Dr. Laurance Flatten only, in person preferably. Writer attempted to ask patient about feelings she has been having, but patient stated that she only wants to speak to Dr. Laurance Flatten. Patient states that she feels safe, and is not in danger. Writer to send to PCP as soonest availability is 4/30.

## 2019-09-06 ENCOUNTER — Ambulatory Visit: Payer: Medicare (Managed Care) | Admitting: Primary Care

## 2019-09-06 ENCOUNTER — Encounter: Payer: Self-pay | Admitting: Primary Care

## 2019-09-06 VITALS — BP 136/82 | HR 91 | Temp 97.5°F | Ht 65.75 in | Wt 263.0 lb

## 2019-09-06 DIAGNOSIS — F419 Anxiety disorder, unspecified: Secondary | ICD-10-CM

## 2019-09-06 DIAGNOSIS — M26629 Arthralgia of temporomandibular joint, unspecified side: Secondary | ICD-10-CM

## 2019-09-06 DIAGNOSIS — F329 Major depressive disorder, single episode, unspecified: Secondary | ICD-10-CM

## 2019-09-06 DIAGNOSIS — F32A Depression, unspecified: Secondary | ICD-10-CM

## 2019-09-06 DIAGNOSIS — Z634 Disappearance and death of family member: Secondary | ICD-10-CM

## 2019-09-06 DIAGNOSIS — F4320 Adjustment disorder, unspecified: Secondary | ICD-10-CM

## 2019-09-06 MED ORDER — SERTRALINE HCL 100 MG PO TABS *I*
100.0000 mg | ORAL_TABLET | Freq: Every day | ORAL | 3 refills | Status: DC
Start: 2019-09-06 — End: 2020-03-13

## 2019-09-06 NOTE — Patient Instructions (Signed)
It Helps to Talk  [facility] provides an extensive menu of support groups for caregivers and for the bereaved. Trained bereavement staff and volunteers are also available for one-to-one conversations, in person or on the telephone. Please call [phone] for more information or to make an appointment.        NOTE: As part of our effort to limit the spread of COVID-19 in our area, [facility] is postponing all wellness classes and support groups at our [facility], [place] and other community locations. Remote support via phone or Skype will be offered whenever possible. Please call [phone] with any questions and continue to check our social media channels for updates. Thank you for your cooperation.    For further information on Bereavement and Support Groups in [facility] call [phone]. For programs in [place] and [place], call [phone].

## 2019-09-06 NOTE — Progress Notes (Signed)
[facility]         SUBJECTIVE    Pt is here to discuss:    Chief Complaint   Patient presents with    Grief     patient here for follow up     Depression         1. Fu grief - poor energy and anehdonia, having hard time getting out of bed in AM. Feeling lonely in her new apartment, saddened by upcoming bday of deceased husband on 08-20-2022. Falls asleep fine    2. R ear pain - asking for assessment         PMH / Family Hx / Social Hx  Patient's medications, allergies, problem list, past medical, social histories were reviewed and notable for:      Current Outpatient Medications   Medication Sig    sertraline (ZOLOFT) 100 MG tablet Take 1 tablet (100 mg total) by mouth daily Replacing 50mg  dose, please cancel other refills    fluticasone-umeclidinium-vilanterol (TRELEGY ELLIPTA) 200-62.5-25 MCG/INH diskus inhaler Inhale 1 puff into the lungs daily    gabapentin (GABAPENTIN) 300 MG capsule Take 1 capsule (300 mg total) by mouth 3 times daily    montelukast (SINGULAIR) 10 MG tablet Take 1 tablet (10 mg total) by mouth nightly    pantoprazole (PROTONIX) 20 MG EC tablet Take 1 tablet (20 mg total) by mouth daily 30 min before breakfast Swallow whole. Do not crush, break, or chew.    albuterol HFA (PROVENTIL, VENTOLIN, PROAIR HFA) 108 (90 Base) MCG/ACT inhaler Inhale 2 puffs into the lungs every 4-6 hours as needed    azelastine (ASTELIN) 0.1 % nasal spray 1-2 sprays by Nasal route 2 times daily    Spacer/Aero-Holding Chambers (EASIVENT) spacer Use twice daily with Symbicort inhaler. Breathe in slowly.    fluticasone (FLONASE) 50 MCG/ACT nasal spray 2 sprays by Nasal route daily    Non-System Medication Medication/Supply: R thumb spica brace  Directions for Use: wear nightly    ipratropium-albuterol (DUONEB) 0.5-2.5mg  /80mL nebulizer solution Take 3 mLs by nebulization every 6 hours    ipratropium (ATROVENT) 0.06 % nasal spray 2 sprays by Each Nare route 3 times daily    Misc. Devices (CPAP)  machine Download and compliance report    atorvastatin (LIPITOR) 40 MG tablet Take 40 mg by mouth daily (with dinner)    Cholecalciferol (VITAMIN D3) 5000 UNITS TABS Take 5,000 Units by mouth every morning       docusate sodium (COLACE) 100 MG capsule Take 200 mg by mouth every morning       clopidogrel (PLAVIX) 75 MG tablet Take 75 mg by mouth every morning             OBJECTIVE  Vitals:    09/06/19 1152   BP: 136/82   BP Location: Left arm   Pulse: 91   Temp: 36.4 C (97.5 F)   SpO2: 98%   Weight: 119.3 kg (263 lb)   Height: 1.67 m (5' 5.75")     Body mass index is 42.78 kg/m.      General: well-appearing Caucasian female, pleasant & conversant, in NAD   . ENT: R TM obscured by cerumen, removed easily and subsequently visualized to be grey, without bulging or erythema.  +crepitus and audible cracking with open/closing of jaw and hypertrophy/tendernss of R masseter muscle   Psych: AAOx3, normal affect and mood. Insight and judgement intact.       Recent Review Flowsheet Data  PHQ Scores 09/06/2019 02/03/2019 10/22/2018 01/19/2018 04/14/2017 09/20/2016 03/21/2016    PSQ2 Q1 - Interest/Pleasure Y N N N N Y -    PSQ2 Q2 - Down, Depressed, Hopeless Y N N N N N -    PHQ Q9 - Better Off Dead 1 - - - - 0 0    PHQ Calculated Score 16 - - - - 4 2        GAD-7 Dates 09/06/2019 04/14/2017 03/21/2016 05/16/2015   Total Score 14 3 2 16          ASSESSMENT & PLAN  1. Anxiety and depression  - sertraline (ZOLOFT) 100 MG tablet; Take 1 tablet (100 mg total) by mouth daily Replacing 50mg  dose, please cancel other refills  Dispense: 90 tablet; Refill: 3  2. Bereavement reaction    Worsening depression symptoms in the setting of losing her husband due to chronic medical illness. Recommended dose increase of sertraline from 50 to 100mg . F/u 1 mo to review effect via mychart. Given list of grief resources (lifetime care counseling services).    3. TMJ arthralgia  Recommended mouth guard, nighttime stretches, handout provided      Follow-up:  as scheduled in june      [name], MD  [facility]  09/06/2019  12:06 PM        ______________________

## 2019-10-04 ENCOUNTER — Encounter: Payer: Self-pay | Admitting: Gastroenterology

## 2019-10-12 ENCOUNTER — Other Ambulatory Visit: Payer: Self-pay | Admitting: Pulmonology

## 2019-10-12 ENCOUNTER — Encounter: Payer: Self-pay | Admitting: Primary Care

## 2019-10-12 DIAGNOSIS — R0600 Dyspnea, unspecified: Secondary | ICD-10-CM

## 2019-10-12 MED ORDER — MONTELUKAST SODIUM 10 MG PO TABS *I*
10.0000 mg | ORAL_TABLET | Freq: Every evening | ORAL | 11 refills | Status: DC
Start: 2019-10-12 — End: 2020-02-28

## 2019-10-18 ENCOUNTER — Encounter: Payer: Self-pay | Admitting: Primary Care

## 2019-10-25 ENCOUNTER — Inpatient Hospital Stay
Admission: EM | Admit: 2019-10-25 | Discharge: 2019-11-09 | DRG: 964 | Disposition: A | Discharge status: Home / Self Care | Payer: Medicare (Managed Care) | Source: Ambulatory Visit | Attending: Surgery | Admitting: Surgery

## 2019-10-25 ENCOUNTER — Emergency Department: Payer: Medicare (Managed Care)

## 2019-10-25 DIAGNOSIS — Z7901 Long term (current) use of anticoagulants: Secondary | ICD-10-CM

## 2019-10-25 DIAGNOSIS — Z86718 Personal history of other venous thrombosis and embolism: Secondary | ICD-10-CM

## 2019-10-25 DIAGNOSIS — S12120A Other displaced dens fracture, initial encounter for closed fracture: Secondary | ICD-10-CM

## 2019-10-25 DIAGNOSIS — S36029A Unspecified contusion of spleen, initial encounter: Secondary | ICD-10-CM

## 2019-10-25 DIAGNOSIS — S8992XA Unspecified injury of left lower leg, initial encounter: Secondary | ICD-10-CM

## 2019-10-25 DIAGNOSIS — W108XXA Fall (on) (from) other stairs and steps, initial encounter: Secondary | ICD-10-CM | POA: Diagnosis present

## 2019-10-25 DIAGNOSIS — Z955 Presence of coronary angioplasty implant and graft: Secondary | ICD-10-CM

## 2019-10-25 DIAGNOSIS — R7303 Prediabetes: Secondary | ICD-10-CM | POA: Diagnosis present

## 2019-10-25 DIAGNOSIS — S12110A Anterior displaced Type II dens fracture, initial encounter for closed fracture: Principal | ICD-10-CM | POA: Diagnosis present

## 2019-10-25 DIAGNOSIS — S3992XA Unspecified injury of lower back, initial encounter: Secondary | ICD-10-CM

## 2019-10-25 DIAGNOSIS — R0902 Hypoxemia: Secondary | ICD-10-CM | POA: Diagnosis not present

## 2019-10-25 DIAGNOSIS — Z951 Presence of aortocoronary bypass graft: Secondary | ICD-10-CM

## 2019-10-25 DIAGNOSIS — G4733 Obstructive sleep apnea (adult) (pediatric): Secondary | ICD-10-CM | POA: Diagnosis present

## 2019-10-25 DIAGNOSIS — D62 Acute posthemorrhagic anemia: Secondary | ICD-10-CM | POA: Diagnosis present

## 2019-10-25 DIAGNOSIS — J984 Other disorders of lung: Secondary | ICD-10-CM | POA: Diagnosis present

## 2019-10-25 DIAGNOSIS — E669 Obesity, unspecified: Secondary | ICD-10-CM | POA: Diagnosis present

## 2019-10-25 DIAGNOSIS — S299XXA Unspecified injury of thorax, initial encounter: Secondary | ICD-10-CM

## 2019-10-25 DIAGNOSIS — S2232XA Fracture of one rib, left side, initial encounter for closed fracture: Secondary | ICD-10-CM

## 2019-10-25 DIAGNOSIS — Y9389 Activity, other specified: Secondary | ICD-10-CM

## 2019-10-25 DIAGNOSIS — S27321A Contusion of lung, unilateral, initial encounter: Secondary | ICD-10-CM | POA: Diagnosis present

## 2019-10-25 DIAGNOSIS — S36030A Superficial (capsular) laceration of spleen, initial encounter: Secondary | ICD-10-CM | POA: Diagnosis present

## 2019-10-25 DIAGNOSIS — R6510 Systemic inflammatory response syndrome (SIRS) of non-infectious origin without acute organ dysfunction: Secondary | ICD-10-CM | POA: Diagnosis present

## 2019-10-25 DIAGNOSIS — F329 Major depressive disorder, single episode, unspecified: Secondary | ICD-10-CM | POA: Diagnosis present

## 2019-10-25 DIAGNOSIS — Z86711 Personal history of pulmonary embolism: Secondary | ICD-10-CM

## 2019-10-25 DIAGNOSIS — S0101XA Laceration without foreign body of scalp, initial encounter: Secondary | ICD-10-CM | POA: Diagnosis present

## 2019-10-25 DIAGNOSIS — W19XXXA Unspecified fall, initial encounter: Secondary | ICD-10-CM

## 2019-10-25 DIAGNOSIS — Y9289 Other specified places as the place of occurrence of the external cause: Secondary | ICD-10-CM

## 2019-10-25 DIAGNOSIS — S301XXA Contusion of abdominal wall, initial encounter: Secondary | ICD-10-CM | POA: Diagnosis present

## 2019-10-25 DIAGNOSIS — G2581 Restless legs syndrome: Secondary | ICD-10-CM | POA: Diagnosis present

## 2019-10-25 DIAGNOSIS — K661 Hemoperitoneum: Secondary | ICD-10-CM

## 2019-10-25 DIAGNOSIS — G8911 Acute pain due to trauma: Secondary | ICD-10-CM | POA: Diagnosis present

## 2019-10-25 DIAGNOSIS — S12120D Other displaced dens fracture, subsequent encounter for fracture with routine healing: Secondary | ICD-10-CM

## 2019-10-25 DIAGNOSIS — S0011XA Contusion of right eyelid and periocular area, initial encounter: Secondary | ICD-10-CM | POA: Diagnosis present

## 2019-10-25 DIAGNOSIS — I2510 Atherosclerotic heart disease of native coronary artery without angina pectoris: Secondary | ICD-10-CM | POA: Diagnosis present

## 2019-10-25 DIAGNOSIS — W109XXA Fall (on) (from) unspecified stairs and steps, initial encounter: Secondary | ICD-10-CM

## 2019-10-25 DIAGNOSIS — S7011XA Contusion of right thigh, initial encounter: Secondary | ICD-10-CM | POA: Diagnosis present

## 2019-10-25 DIAGNOSIS — S2242XA Multiple fractures of ribs, left side, initial encounter for closed fracture: Secondary | ICD-10-CM | POA: Diagnosis present

## 2019-10-25 DIAGNOSIS — Y999 Unspecified external cause status: Secondary | ICD-10-CM

## 2019-10-25 DIAGNOSIS — F419 Anxiety disorder, unspecified: Secondary | ICD-10-CM | POA: Diagnosis present

## 2019-10-25 DIAGNOSIS — Z20822 Contact with and (suspected) exposure to covid-19: Secondary | ICD-10-CM | POA: Diagnosis present

## 2019-10-25 DIAGNOSIS — T148XXA Other injury of unspecified body region, initial encounter: Secondary | ICD-10-CM | POA: Diagnosis present

## 2019-10-25 DIAGNOSIS — S0003XA Contusion of scalp, initial encounter: Secondary | ICD-10-CM

## 2019-10-25 DIAGNOSIS — S36202A Unspecified injury of tail of pancreas, initial encounter: Secondary | ICD-10-CM | POA: Diagnosis present

## 2019-10-25 DIAGNOSIS — S36039A Unspecified laceration of spleen, initial encounter: Secondary | ICD-10-CM

## 2019-10-25 DIAGNOSIS — I959 Hypotension, unspecified: Secondary | ICD-10-CM | POA: Diagnosis not present

## 2019-10-25 DIAGNOSIS — S3993XA Unspecified injury of pelvis, initial encounter: Secondary | ICD-10-CM

## 2019-10-25 DIAGNOSIS — S199XXA Unspecified injury of neck, initial encounter: Secondary | ICD-10-CM

## 2019-10-25 DIAGNOSIS — S0083XA Contusion of other part of head, initial encounter: Secondary | ICD-10-CM | POA: Diagnosis present

## 2019-10-25 DIAGNOSIS — J449 Chronic obstructive pulmonary disease, unspecified: Secondary | ICD-10-CM | POA: Diagnosis present

## 2019-10-25 DIAGNOSIS — S2769XA Other injury of pleura, initial encounter: Secondary | ICD-10-CM | POA: Diagnosis present

## 2019-10-25 DIAGNOSIS — S6991XA Unspecified injury of right wrist, hand and finger(s), initial encounter: Secondary | ICD-10-CM

## 2019-10-25 DIAGNOSIS — S36892A Contusion of other intra-abdominal organs, initial encounter: Secondary | ICD-10-CM | POA: Diagnosis present

## 2019-10-25 DIAGNOSIS — S36229A Contusion of unspecified part of pancreas, initial encounter: Secondary | ICD-10-CM

## 2019-10-25 DIAGNOSIS — J309 Allergic rhinitis, unspecified: Secondary | ICD-10-CM | POA: Diagnosis present

## 2019-10-25 LAB — PLASMA PROF 7 (ED ONLY)
Anion Gap,PL: 9 (ref 7–16)
CO2,Plasma: 28 mmol/L (ref 20–28)
Chloride,Plasma: 105 mmol/L (ref 96–108)
Creatinine: 0.98 mg/dL — ABNORMAL HIGH (ref 0.51–0.95)
GFR,Black: 66 *
GFR,Caucasian: 57 * — AB
Glucose,Plasma: 151 mg/dL — ABNORMAL HIGH (ref 60–99)
Potassium,Plasma: 4.6 mmol/L (ref 3.3–4.6)
Sodium,Plasma: 142 mmol/L (ref 133–145)
UN,Plasma: 18 mg/dL (ref 6–20)

## 2019-10-25 LAB — CBC AND DIFFERENTIAL
Baso # K/uL: 0.1 10*3/uL (ref 0.0–0.1)
Basophil %: 0.6 %
Eos # K/uL: 0.3 10*3/uL (ref 0.0–0.4)
Eosinophil %: 1.3 %
Hematocrit: 41 % (ref 34–45)
Hemoglobin: 12.3 g/dL (ref 11.2–15.7)
IMM Granulocytes #: 0.2 10*3/uL — ABNORMAL HIGH (ref 0.0–0.0)
IMM Granulocytes: 1.2 %
Lymph # K/uL: 2.6 10*3/uL (ref 1.2–3.7)
Lymphocyte %: 14.1 %
MCH: 28 pg (ref 26–32)
MCHC: 30 g/dL — ABNORMAL LOW (ref 32–36)
MCV: 93 fL (ref 79–95)
Mono # K/uL: 1 10*3/uL — ABNORMAL HIGH (ref 0.2–0.9)
Monocyte %: 5.5 %
Neut # K/uL: 14.4 10*3/uL — ABNORMAL HIGH (ref 1.6–6.1)
Nucl RBC # K/uL: 0 10*3/uL (ref 0.0–0.0)
Nucl RBC %: 0 /100 WBC (ref 0.0–0.2)
Platelets: 281 10*3/uL (ref 160–370)
RBC: 4.4 MIL/uL (ref 3.9–5.2)
RDW: 13.2 % (ref 11.7–14.4)
Seg Neut %: 77.3 %
WBC: 18.6 10*3/uL — ABNORMAL HIGH (ref 4.0–10.0)

## 2019-10-25 LAB — TYPE AND SCREEN
ABO RH Blood Type: O POS
Antibody Screen: NEGATIVE

## 2019-10-25 LAB — PROTIME-INR
INR: 1 (ref 0.9–1.1)
Protime: 11.6 s (ref 10.0–12.9)

## 2019-10-25 LAB — RUQ PANEL (ED ONLY)
ALT: 39 U/L — ABNORMAL HIGH (ref 0–35)
AST: 57 U/L — ABNORMAL HIGH (ref 0–35)
Albumin: 3.8 g/dL (ref 3.5–5.2)
Alk Phos: 157 U/L — ABNORMAL HIGH (ref 35–105)
Amylase: 50 U/L (ref 28–100)
Bilirubin,Direct: <0.2 mg/dL (ref 0.0–0.3)
Bilirubin,Total: 0.2 mg/dL (ref 0.0–1.2)
Lipase: 59 U/L (ref 13–60)
Total Protein: 6.5 g/dL (ref 6.3–7.7)

## 2019-10-25 LAB — ETHANOL: Ethanol: <10 mg/dL (ref 0–9)

## 2019-10-25 LAB — LACTATE, PLASMA: Lactate: 1.4 mmol/L (ref 0.5–2.2)

## 2019-10-25 LAB — APTT: aPTT: 22.4 s — ABNORMAL LOW (ref 25.8–37.9)

## 2019-10-25 MED ORDER — HYDROMORPHONE HCL PF 1 MG/ML IJ SOLN *WRAPPED*
0.2500 mg | INTRAMUSCULAR | Status: DC | PRN
Start: 2019-10-25 — End: 2019-10-26
  Administered 2019-10-26 (×4): 0.25 mg via INTRAVENOUS
  Filled 2019-10-25 (×4): qty 0.5

## 2019-10-25 MED ORDER — ALBUTEROL SULFATE HFA 108 (90 BASE) MCG/ACT IN AERS *I*
2.0000 | INHALATION_SPRAY | RESPIRATORY_TRACT | Status: DC | PRN
Start: 2019-10-25 — End: 2019-10-26
  Filled 2019-10-25: qty 6.7

## 2019-10-25 MED ORDER — ACETAMINOPHEN 10 MG/ML IV SOLN *I*
1000.0000 mg | Freq: Three times a day (TID) | INTRAVENOUS | Status: DC
Start: 2019-10-26 — End: 2019-10-27
  Administered 2019-10-26 – 2019-10-27 (×4): 1000 mg via INTRAVENOUS
  Filled 2019-10-25 (×5): qty 100

## 2019-10-25 MED ORDER — FENTANYL CITRATE 50 MCG/ML IJ SOLN *WRAPPED*
INTRAMUSCULAR | Status: DC
Start: 2019-10-25 — End: 2019-10-25
  Filled 2019-10-25: qty 2

## 2019-10-25 MED ORDER — LIDOCAINE-EPINEPHRINE 1 %-1:100000 IJ SOLN *I*
10.0000 mL | Freq: Once | INTRAMUSCULAR | Status: AC
Start: 2019-10-25 — End: 2019-10-25
  Administered 2019-10-25: 10 mL via INTRADERMAL
  Filled 2019-10-25: qty 20

## 2019-10-25 MED ORDER — FENTANYL CITRATE 50 MCG/ML IJ SOLN *WRAPPED*
INTRAMUSCULAR | Status: AC | PRN
Start: 2019-10-25 — End: 2019-10-25
  Administered 2019-10-25: 50 ug via INTRAVENOUS

## 2019-10-25 MED ORDER — TETANUS-DIPHTH-ACELL PERT 5-2.5-18.5 LF-MCG/0.5 IM SUSP *WRAPPED*
0.5000 mL | Freq: Once | INTRAMUSCULAR | Status: AC
Start: 2019-10-25 — End: 2019-10-25
  Administered 2019-10-25: 0.5 mL via INTRAMUSCULAR

## 2019-10-25 MED ORDER — PLASMA-LYTE IV SOLN *WRAPPED*
100.0000 mL/h | Status: DC
Start: 2019-10-26 — End: 2019-10-27
  Administered 2019-10-26 (×11): 100 mL/h
  Administered 2019-10-26: 100 mL/h via INTRAVENOUS
  Administered 2019-10-26 (×4): 100 mL/h
  Administered 2019-10-26: 100 mL/h via INTRAVENOUS
  Administered 2019-10-26 (×3): 100 mL/h
  Administered 2019-10-26: 100 mL/h via INTRAVENOUS
  Administered 2019-10-27 (×13): 100 mL/h
  Administered 2019-10-27: 100 mL/h via INTRAVENOUS
  Administered 2019-10-27 (×3): 100 mL/h

## 2019-10-25 MED ORDER — LIDOCAINE-EPINEPHRINE 1 %-1:100000 IJ SOLN *I*
20.0000 mL | Freq: Once | INTRAMUSCULAR | Status: DC
Start: 2019-10-26 — End: 2019-10-26

## 2019-10-25 MED ORDER — PANTOPRAZOLE SODIUM 40 MG IV SOLR *I*
40.0000 mg | INTRAVENOUS | Status: DC
Start: 2019-10-25 — End: 2019-10-26
  Administered 2019-10-25: 40 mg via INTRAVENOUS
  Filled 2019-10-25: qty 10

## 2019-10-25 MED ORDER — FLUTICASONE PROPIONATE 50 MCG/ACT NA SUSP *I*
2.0000 | Freq: Every day | NASAL | Status: DC
Start: 2019-10-26 — End: 2019-10-26
  Filled 2019-10-25: qty 16

## 2019-10-25 MED ORDER — IPRATROPIUM-ALBUTEROL 0.5-2.5 MG/3ML IN SOLN *I*
3.0000 mL | Freq: Four times a day (QID) | RESPIRATORY_TRACT | Status: DC
Start: 2019-10-25 — End: 2019-11-09
  Administered 2019-10-26 – 2019-11-09 (×52): 3 mL via RESPIRATORY_TRACT
  Filled 2019-10-25 (×55): qty 3

## 2019-10-25 MED ORDER — IOHEXOL 350 MG/ML (OMNIPAQUE) IV SOLN *I*
1.0000 mL | Freq: Once | INTRAVENOUS | Status: AC
Start: 2019-10-25 — End: 2019-10-25
  Administered 2019-10-25: 147 mL via INTRAVENOUS

## 2019-10-25 MED ORDER — SODIUM CHLORIDE 0.9 % FLUSH FOR PUMPS *I*
0.0000 mL/h | INTRAVENOUS | Status: DC | PRN
Start: 2019-10-25 — End: 2019-11-09

## 2019-10-25 MED ORDER — ONDANSETRON HCL 2 MG/ML IV SOLN *I*
INTRAMUSCULAR | Status: AC
Start: 2019-10-25 — End: 2019-10-25
  Administered 2019-10-25: 4 mg
  Filled 2019-10-25: qty 2

## 2019-10-25 MED ORDER — TETANUS-DIPHTH-ACELL PERT 5-2.5-18.5 LF-MCG/0.5 IM SUSP *WRAPPED*
INTRAMUSCULAR | Status: DC
Start: 2019-10-25 — End: 2019-10-25
  Filled 2019-10-25: qty 0.5

## 2019-10-25 MED ORDER — DEXTROSE 5 % FLUSH FOR PUMPS *I*
0.0000 mL/h | INTRAVENOUS | Status: DC | PRN
Start: 2019-10-25 — End: 2019-11-09

## 2019-10-25 MED ORDER — CEFAZOLIN 2000 MG IN STERILE WATER 20ML SYRINGE *I*
2000.0000 mg | PREFILLED_SYRINGE | Freq: Once | INTRAVENOUS | Status: AC
Start: 2019-10-25 — End: 2019-10-26
  Administered 2019-10-26: 2000 mg via INTRAVENOUS
  Filled 2019-10-25: qty 20

## 2019-10-25 NOTE — Provider Consult (Addendum)
[name]  Time of trauma activation/consult: 20:28  Time of resident response: 20:30    History   Pre-Hospital  Mechanism of Injury: fall down 12-15 stairs  Treatment: c-collar placed     [name] is a 74 y.o. unknown sex presents after fall down 12-15 stairs. Complains of pain in left chest, left thigh.  PMH: CAD, asthma   PSHx : 3vCABG  Meds include plavix.   NKDA    Review of Systems  Review of Systems   Unable to perform ROS: Acuity of condition       Not on File  No past surgical history on file.   No past medical history on file.   Social History     Socioeconomic History    Marital status: Not on file     Spouse name: Not on file    Number of children: Not on file    Years of education: Not on file    Highest education level: Not on file   Tobacco Use    Smoking status: Not on file   Substance and Sexual Activity    Alcohol use: Not on file    Drug use: Not on file    Sexual activity: Not on file   Other Topics Concern    Not on file   Social History Narrative    Not on file     No family history on file.    Primary Survey   HR 66, SaO2 100%   Airway: patent  Breathing: bilateral breath sounds  Circulation: intact  Pulses: Ped: 2+ Rad: 2+ Fem: 2+ Capillary Refill: brisk  Disability: Eye (4): 4 Verbal (5):5 Motor (6): 6 GCS (15): 15; PERRL     Secondary Survey  Physical Exam  Swelling over right eye with ecchymosis  Laceration above left eye with no active bleeding  C-collar in place  Prior sternotomy scar  abd soft ntnd obese  + T spine tenderness, no cervical or lumbar spinal tenderness, rectal tone intact, no gross blood  Tender to palpation over R hand  Abrasion and swelling over Left knee  Moving all extremities     Secondary Data  X-rays:  C-Spine: none  Chest: pending  Pelvis: pending  T/L/S Spine: none  Extremities: none  CAT  Scan:  Head: pending  Spine: pending  Chest: pending  Abd/Pelvis: pending    ECG: none  FAST: none    Laboratory Data  CBC:No results for input(s): WBC, RBC, HGB, HCT, MCV, RDW, PLT in the last 24 hours.    CMP:No results for input(s): NA, K, CL, CO2, GAP, UN, CREAT, GFRC, GFRB, GLU, CA, TP, ALB, TB, AST, ALT, ALKT, AMY, LIP in the last 24 hours.    ABG:No results for input(s): APH, APCO2, APO2, AHCO3, ABE, AOSAT in the last 24 hours.    Urine Drugs:No results for input(s): AMPU, BEU, BZDU, OPSU, THCU in the last 24 hours.    UA:No results for input(s): UCOL, UAPP, UAGLU, USG, UBLD, UPRO, UNITR, ULEU, URBC, UWBC, [place], [place], [place], [place] in the last 24 hours.    No components found with this basename: UKET, UPH    Clinical Management  There is no problem list on file for this patient.       Management Plan  CXR, pelvic XR, [facility]  Ortho requesting r hand and l knee x-rays  Disposition: pending work up  Time Trauma Attending Notified: 20:40    Consultants  Orthopedics: pending imaging    Marijean Bravo, MD as of 8:37 PM, 10/25/2019     R4 addendum  85F L2T fall from 15 stairs; Unclear mechanism; Hx of CABGx3 9y ago (per pt report) on plavix; Primary survey intact. Secondary w/ large R forehead hematoma with periorbital swelling. T spine tenderness. L knee hematoma.    CXR w/o obvious PTX. PXR w/o obvious fx.    Pan CT  CT head R scalp hematoma, L parietal scalp lac; Partly visualized likely type III odontoid fracture  CT C spine Acute mildly displaced type III odontoid fracture without narrowing of the central spinal canal  CTA neck no acute findings  CT max-face no acute findings  CT chest L 2, 5-6 rib fx, small extrapleural hematoma  CT AP possible L lumbar vein extav, periaortic fat stranding, small splenic lac, 3cm hematoma adjacent to the pancreas ?pancreatic injury  CT T spine no acute findings  CT L spine no acute findings    Plan  Admit to [place], Dr. Maura Crandall  NPO, IVF  Q6H H/H  EKG to r/o arrhythmia given unclear  circumstances of pt's fall  F/U final read of CTs  Spine consult; Maintain c-collar, spine precautions  Vascular consult  TEG w/ PLT mapping  SCDs    Discussed w/ attending on-call Dr. Philippa Chester, MD   Surgery Resident

## 2019-10-25 NOTE — Provider Consult (Addendum)
Vascular Surgery Consult H & P    Consult Reason:   Concern for aortic injury in trauma patient    Requested By:   [facility] Trauma service     HPI:   [name] is a 74 y.o. female w/pmhx CAD s/p CABG 2012 in [place] and recent stenting angio 2015 on plavix, COPD, OSA, DM who presented as level II Trauma after fall down 12 stairs at nephew's house during family party. Trauma evaluation revealed odontoid fracture, splenic lac, rib fractures, possible pancreatic injury, and concern for periaortic fat stranding possible aortic injury. Vascular surgery consulted for assessment and management.     [name] denies back or abdominal pain. She admits to some numbness of the LE.   Normotensive in ED  WBC 18   H/H 12/41    REVIEW OF SYSTEMS  ROS- pertinent reviewed in HPI above     MEDICAL HISTORY  Past Medical History:   Diagnosis Date    Allergic rhinitis 09/30/2014    Arthritis     Asthma     CAD (coronary artery disease)     DVT (deep venous thrombosis)     post-op after CABG, on anticoagulation for ~2 years    PE (pulmonary embolism)     post-op after CABG, on anticoagulation for ~2 years    Prediabetes     Tubular adenoma 03/2015    colonoscopy      SURGICAL HISTORY  Past Surgical History:   Procedure Laterality Date    ACHILLES TENDON SURGERY      APPENDECTOMY      CHOLECYSTECTOMY      CORONARY ANGIOPLASTY WITH STENT PLACEMENT      CORONARY ARTERY BYPASS GRAFT  2013    triple     CYST REMOVAL      both thumbs    PR REPAIR EYE BLOWOUT,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 MAIN OR;  Service: OMFS    TONSILLECTOMY AND ADENOIDECTOMY       FAMILY HISTORY  Family History   Problem Relation Age of Onset    Other Mother         "rare lung disease"    Aneurysm Mother         abdominal aortic aneurysm    Diabetes Type II Mother     Breast cancer Mother     Blood clots Mother     Stroke Father     Stroke Brother          x2    Thyroid cancer Daughter       SOCIAL HISTORY   reports that she has never smoked. She has never used smokeless tobacco. She reports that she does not drink alcohol and does not use drugs.     ALLERGIES  Morphine     MEDICATIONS  Current Facility-Administered Medications   Medication    albuterol HFA (PROVENTIL, VENTOLIN, PROAIR HFA) inhaler 2 puff    [START ON 10/26/2019] fluticasone (FLONASE) 50 MCG/ACT nasal spray 2 spray    ipratropium-albuterol (DUONEB) 0.5-2.5mg  /81mL nebulization solution 3 mL    pantoprazole (PROTONIX) 4 mg/ml injection 40 mg    ceFAZolin (ANCEF) 2,000 mg in sterile water (PF) IV syringe 20 mL     Current Outpatient Medications   Medication Sig    montelukast (SINGULAIR) 10 MG tablet Take 1 tablet (10 mg total) by mouth nightly    sertraline (ZOLOFT) 100 MG tablet Take 1  tablet (100 mg total) by mouth daily Replacing 50mg  dose, please cancel other refills    fluticasone-umeclidinium-vilanterol (TRELEGY ELLIPTA) 200-62.5-25 MCG/INH diskus inhaler Inhale 1 puff into the lungs daily    albuterol HFA (PROVENTIL, VENTOLIN, PROAIR HFA) 108 (90 Base) MCG/ACT inhaler Inhale 2 puffs into the lungs every 4-6 hours as needed    gabapentin (GABAPENTIN) 300 MG capsule Take 1 capsule (300 mg total) by mouth 3 times daily    pantoprazole (PROTONIX) 20 MG EC tablet Take 1 tablet (20 mg total) by mouth daily 30 min before breakfast Swallow whole. Do not crush, break, or chew.    azelastine (ASTELIN) 0.1 % nasal spray 1-2 sprays by Nasal route 2 times daily    Spacer/Aero-Holding Chambers (EASIVENT) spacer Use twice daily with Symbicort inhaler. Breathe in slowly.    fluticasone (FLONASE) 50 MCG/ACT nasal spray 2 sprays by Nasal route daily    Non-System Medication Medication/Supply: R thumb spica brace  Directions for Use: wear nightly    ipratropium-albuterol (DUONEB) 0.5-2.5mg  /67mL nebulizer solution Take 3 mLs by nebulization every 6 hours    ipratropium (ATROVENT) 0.06 % nasal  spray 2 sprays by Each Nare route 3 times daily    Misc. Devices (CPAP) machine Download and compliance report    atorvastatin (LIPITOR) 40 MG tablet Take 40 mg by mouth daily (with dinner)    Cholecalciferol (VITAMIN D3) 5000 UNITS TABS Take 5,000 Units by mouth every morning       docusate sodium (COLACE) 100 MG capsule Take 200 mg by mouth every morning       clopidogrel (PLAVIX) 75 MG tablet Take 75 mg by mouth every morning         Objective:      BP: (101-169)/(68-85)   Temp:  [35.8 C (96.4 F)-36 C (96.8 F)]   Heart Rate:  [57-87]   Resp:  [13-24]   SpO2:  [95 %-100 %]   Height:  [177.8 cm (5\' 10" )]   Weight:  [120.2 kg (265 lb)]     Intake/Output  No intake/output data recorded.    CMP  Recent Labs   Lab 10/25/19  2048   Creatinine 0.98*   ALT 39*   AST 57*   Bilirubin,Total 0.2   Bilirubin,Direct <0.2   Amylase 50   Lipase 59     No components found with this basename: BUN, LABGLOM, GLUCOSE, CALCIUM,  LAC    Coags  Recent Labs   Lab 10/25/19  2048   INR 1.0   aPTT 22.4*     No components found with this basename: APTT    CBC  Recent Labs   Lab 10/25/19  2048   WBC 18.6*   Hemoglobin 12.3   Hematocrit 41   Platelets 281     Imaging:  CT chest/abdomen with IV contrast 10/25/2019      Physical Exam:      General appearance: alert, mild distress and cooperative  HEENT: extra ocular movement intact and neck supple with midline trachea  CV: regular rate and rhythm  Lungs: unlabored respirations on nonrebreather  Abd: soft.   Extremities:  Bilateral brachial pulses palpable 2+  Right radial palpable 2+  Left radial harvested for prior CABG  Bilateral femoral and DP pulses palpable 2+  Upper and lower extremities warm, with cap refill <3 sec   Neuro: normal without focal findings    Assessment:   [name] is a 74 y.o. female  with periaortic enhancement of the descending  thoracic aorta on CT abdomen/chest with IV contrast and L lumbar vein injury after fall down 12 stairs with associated  splenic lac, odontoid fracture, and multiple rib fractures. No aortic intimal injury on this imaging modality, but it is not the modality of choice to assess for blunt traumatic aortic injury--- patient needs dedicated CT angio chest/abdomen/pelvis. She is hemodynamically stable and normotensive with excellent distal perfusion to all 4 extremities. Recommend q1hr pulse checks and hemodynamic monitoring with repeat imaging in 24hours with CT angio to better delineate aorta. No acute vascular surgical intervention at this time.     Plan:     - q1hr pulse checks and hemodynamic monitoring  - CT angio chest/abdomen/pelvis in 24 hours   - If sudden AMS or hypotension, please notify vascular surgery  - Case discussed and imaging reviewed with on-call attending, Dr. Lissa Merlin  - Plan reviewed with Trauma team and patient   - We will continue to follow and review CT angio once obtained     Please page the vascular surgery resident on call for any questions.  Ardelle Balls, MD  Vascular Surgery  10/25/2019 at 11:00 PM

## 2019-10-25 NOTE — ED Triage Notes (Signed)
Fell down about 12-15 ft down stairs. Hit head. Denies LOC. On Plavix. Hematoma to R eye. Also complaints of L eye, L rib pain, neck pain, and L thigh pain. Denies numbness/tingling. EKG in triage. A, [facility] out to assess, level 2 trauma alert called.     Triage Note   Shelah Lewandowsky, RN

## 2019-10-25 NOTE — ED Provider Notes (Addendum)
History     Chief Complaint   Patient presents with    Fall     74 year old female with past medical history of CAD, DVT, on Plavix presents after a fall earlier this evening.  Unclear mechanism, but she felt down approximately 12 to 15 feet of stairs.  Denies LOC.  Has an obvious hematoma above her right eye.  Complaining of left rib, neck pain.  No numbness or tingling reported.  Level 2 trauma called to triage.      History limited by:  Acuity of condition      Medical/Surgical/Family History     Past Medical History:   Diagnosis Date    Allergic rhinitis 09/30/2014    Arthritis     Asthma     CAD (coronary artery disease)     DVT (deep venous thrombosis)     post-op after CABG, on anticoagulation for ~2 years    PE (pulmonary embolism)     post-op after CABG, on anticoagulation for ~2 years    Prediabetes     Tubular adenoma 03/2015    colonoscopy         Patient Active Problem List   Diagnosis Code    Severe Obstructive sleep apnea G47.33    Pulmonary nodule R91.1    CAD (coronary artery disease) I25.10    Prediabetes R73.03    Gastritis K29.70    Obesity E66.9    Dysthymia F34.1    Facial fracture due to fall S02.92XA, [id number]    Orbital floor fracture S02.30XA    Restless leg syndrome G25.81    Health care maintenance Z00.00    Aortic stenosis I35.0    Anxiety and depression F41.9, F32.9    COPD (chronic obstructive pulmonary disease) J44.9    Osteopenia M85.80    Environmental allergies Z91.09            Past Surgical History:   Procedure Laterality Date    ACHILLES TENDON SURGERY      APPENDECTOMY      CHOLECYSTECTOMY      CORONARY ANGIOPLASTY WITH STENT PLACEMENT      CORONARY ARTERY BYPASS GRAFT  2013    triple     CYST REMOVAL      both thumbs    PR REPAIR EYE BLOWOUT,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    Procedure: ORBIT ORIF;  Surgeon: Jannett Celestine, DDS;  Location: [facility] MAIN OR;  Service: OMFS    TONSILLECTOMY AND ADENOIDECTOMY       Family History   Problem Relation  Age of Onset    Other Mother         "rare lung disease"    Aneurysm Mother         abdominal aortic aneurysm    Diabetes Type II Mother     Breast cancer Mother     Blood clots Mother     Stroke Father     Stroke Brother         x2    Thyroid cancer Daughter           Social History     Tobacco Use    Smoking status: Never Smoker    Smokeless tobacco: Never Used   Substance Use Topics    Alcohol use: No    Drug use: No     Living Situation     Questions Responses    Patient lives with Spouse    Homeless No    Caregiver for other family member No  External Services None    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  First Pain Reported  0-10 Scale: 0, (10/25/19 2021)       Physical Exam  Vitals and nursing note reviewed.   HENT:      Head:      Comments: Large hematoma over the right eye.  Laceration to the top left of the head, no active bleeding.  Approximately 8 cm.     Nose: Nose normal.      Mouth/Throat:      Mouth: Mucous membranes are moist.      Pharynx: Oropharynx is clear.   Eyes:      Pupils: Pupils are equal, round, and reactive to light.      Comments: When the right eye is manually open, the pupils appear to be equal, she has full extraocular movements in her bilateral pupils.  Both are reactive to light.  Going from 3 to 2 mm.   Neck:      Comments: In c-collar  Cardiovascular:      Rate and Rhythm: Normal rate and regular rhythm.      Pulses: Normal pulses.   Pulmonary:      Effort: Pulmonary effort is normal. No respiratory distress.   Abdominal:      General: Abdomen is flat. There is no distension.       Palpations: Abdomen is soft.      Tenderness: There is no abdominal tenderness. There is no guarding.   Musculoskeletal:         General: No swelling or deformity.      Cervical back: Tenderness present.      Comments: Tender to palpation on the cervical and thoracic spine.  Tender to palpation of the left chest.  No step-offs.  Also with right hand tenderness   Skin:     General: Skin is warm and dry.   Neurological:      Mental Status: She is alert and oriented to person, place, and time. Mental status is at baseline.      Comments: Patient slightly slowed, but answering questions appropriately, following commands.  GCS 15.  Strength intact in all 4 extremities, sensation to light touch also intact.   Psychiatric:         Mood and Affect: Mood normal.         Medical Decision Making   Patient seen by me on:  10/25/2019    Assessment:  74 year old female presents after fall from approximately 12 feet of stairs.  Neuro intact on presentation but with significant pain in her left side.  Also with obvious large hematoma above her right eye, laceration to her head.  No evidence of injury to the eye itself on exam.  Will perform trauma CT scans, continue to reassess alongside the trauma team.  Hemodynamically stable at presentation, although she was placed on oxygen due to some relative hypoxic readings.    Differential diagnosis:  1.  Intracranial hemorrhage, skull fracture, scalp laceration, closed head injury, facial fractures, ocular injury.  2. Cervical/thoracic/lumbar spine fracture, dislocation  3. Pneumothorax, rib fractures, pulmonary contusions, mediastinal injury, great vessel trauma  4. Intestinal perforation, abdominal solid organ injury  5. Orthopedic injury   6.  Road rash/skin abrasions  7. Further injury may be found with additional scans and tertiary trauma evaluation.       Plan:  Orders Placed This Encounter      Ultrasound - Procedure Guidance      Chest single frontal view      * Pelvis STANDARD AP  view      CT head without contrast for TRAUMA      CT chest with IV contrast      CT abdomen and pelvis with IV contrast      CT angio neck carotid      CT Cervical Spine Reprocess from CT      CT lumbar spine reprocess from CT      CT thoracic spine reprocess from CT      CT maxillofacial without contrast      * Hand RIGHT standard PA, Lateral, and Oblique views      * Knee LEFT standard AP, Lateral, Patellar views      Plasma profile 7 [facility] ED ONLY)      Right upper quadrant panel (RUQ panel)      CBC and differential      Protime-INR      APTT      Ethanol      Lactic acid, plasma      CBC and differential      Basic metabolic panel      Magnesium      Phosphorus      Diet NPO effective now      Vital signs      Mechanical VTE Prophylaxis Application (Meaningful Use)      Intermittent pneumatic compression (IPC) continuous, with qshift assessment      Vital signs      Bedrest      Continuous telemetry, non-protocol      Continuous pulse oximetry      EKG 12 lead      Type and screen      Venipuncture provider      Admit to Hospital Inpatient      Inpatient consult to Social Worker, SBIRT    Medications  lidocaine-EPINEPHrine 1 %-1:100000 injection 10 mL (has no administration in time range)  albuterol HFA (PROVENTIL, VENTOLIN, PROAIR HFA) inhaler 2 puff (has no administration in time range)  fluticasone (FLONASE) 50 MCG/ACT nasal spray 2 spray (has no administration in time range)  ipratropium-albuterol (DUONEB) 0.5-2.5mg  /9mL nebulization solution 3 mL (has no administration in time range)  pantoprazole (PROTONIX) 4 mg/ml injection 40 mg (has no administration in time range)  Tdap (BOOSTRIX): tetanus, diphtheria, and accellar pertussis injection 0.5 mL (0.5 mLs Intramuscular Given 10/25/19 2042)  fentaNYL (SUBLIMAZE) 50 mcg/mL injection ( Intravenous Canceled Entry 10/25/19 2045)  iohexol (OMNIPAQUE) 350 MG/ML injection 1-150 mL (147 mLs Intravenous Given 10/25/19 2059)      ED Course and Disposition:  Patient  found to have type III odontoid fracture on her CT cervical spine.  No evidence of intracranial hemorrhage.  The large laceration to her head was repaired at the bedside with staples.  Also with multiple left-sided rib fractures.  Leukocytosis to 19.    Of note, there is also concern for possible vascular injury  around the aorta.      [facility].  Vascular, neurosurgery recommendations pending at the time of admission.    I have updated the patient's family regarding the cervical spine injury, rib fractures.  The abdominal read was not back at the time of initial family update, thus the aortic findings were not discussed.            Milus Banister, MD      Resident Attestation:    Patient seen by me on 10/25/2019.    I saw and evaluated the patient. I agree with the resident's/fellow's findings and plan of care as documented above.  Critical Care    There is a high probability of imminent or life threatening deterioration due to trauma.    Acute interventions include develop tx plan w/pt or surrogate, discussions w/other provider, documenting the case, eval pt's response to tx, initial hx & physical exam, IV meds (specify below), obtaining hx from pt or surrogate, order & perform tx & interventions, order & review of lab studies, order & review of radiology studies and re-eval of pt's condition.    IV medications given:  Fentanyl    I personally spent 35 cumulative minutes performing critical care interventions to this patient as outlined above. This excludes separately billable procedures.       Author:  Gwenlyn Fudge, MD       Milus Banister, MD  Resident  10/25/19 8299       Gwenlyn Fudge, MD  10/26/19 [phone]

## 2019-10-25 NOTE — ED Procedure Documentation (Signed)
Procedures   Ultrasound - Procedure Guidance  Performed by: Gwenlyn Fudge, MD  Authorized by: Gwenlyn Fudge, MD       Procedure details:     Indications: vascular access      Guidance: dynamic       Exam limitations: body habitus    Impression:     successful procedure guidance         18g IV to L AC, performed by Milus Banister, MD PGY-2     Images were interpreted by me and archived to [facility] PACS.          Gwenlyn Fudge, MD     Gwenlyn Fudge, MD  10/25/19 2053

## 2019-10-25 NOTE — ED Notes (Signed)
Level 2 trauma alert called.    There was pre-hospital notification by EMS. Darci Needle    74 year old female - fall down 12 to 15 stairs, hematoma to right eye, pain in ribs, chest and left thigh. Pt is on Plavix.     Team page @: 2025    Text page @: 2026    Author Alfredo Martinez, RN as of 10/25/2019 at 8:40 PM

## 2019-10-25 NOTE — H&P (Addendum)
Department of Neurosurgery  Consultation Note           Reason for Consult: Odontoid fracture    On-Call Attending: Dr. Marianne Sofia    Paged at [phone], patient evaluated at 2300.    History of Present Illness: [name] is a 74 y.o. female with PMH of OSA, DVT, CAD, CABG, anxiety, depression, obesity, COPD presenting after fall down 1215 stairs.  Patient noted to have right thigh hematoma and left scalp laceration.    GCS 15 on arrival but complained of left chest pain and neck pain.  Denies any numbness or weakness.    Called regarding type III odontoid fracture.  Other injuries include multiple rib fractures, concern for aortic injury, and possible pancreatic injury.      Antiplatelets: Plavix  Anticoagulation: No      Past Medical History:   Past Medical History:   Diagnosis Date    Allergic rhinitis 09/30/2014    Arthritis     Asthma     CAD (coronary artery disease)     DVT (deep venous thrombosis)     post-op after CABG, on anticoagulation for ~2 years    PE (pulmonary embolism)     post-op after CABG, on anticoagulation for ~2 years    Prediabetes     Tubular adenoma 03/2015    colonoscopy        Past Surgical History:   Past Surgical History:   Procedure Laterality Date    ACHILLES TENDON SURGERY      APPENDECTOMY      CHOLECYSTECTOMY      CORONARY ANGIOPLASTY WITH STENT PLACEMENT      CORONARY ARTERY BYPASS GRAFT  2013    triple     CYST REMOVAL      both thumbs    PR REPAIR EYE BLOWOUT,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    Procedure: ORBIT ORIF;  Surgeon: Jannett Celestine, DDS;  Location: [facility] MAIN OR;  Service: OMFS    TONSILLECTOMY AND ADENOIDECTOMY         Family History:   Family History   Problem Relation Age of Onset    Other Mother         "rare lung disease"    Aneurysm Mother         abdominal aortic aneurysm    Diabetes Type II Mother     Breast cancer Mother     Blood clots Mother     Stroke Father     Stroke Brother         x2    Thyroid cancer Daughter               Social History:   Social History     Socioeconomic History    Marital status: Married     Spouse name: Not on file    Number of children: Not on file    Years of education: Not on file    Highest education level: Not on file   Occupational History    Not on file   Tobacco Use    Smoking status: Never Smoker    Smokeless tobacco: Never Used   Substance and Sexual Activity    Alcohol use: No    Drug use: No    Sexual activity: Not on file   Social History Narrative    May 2016        Lives with her husband in an in law suite at her son's house  Three children, all live locally     Five grandchildren     Retired from school food service       Allergies:   Allergies   Allergen Reactions    Morphine Hives     Has tolerated Percocet       Medications:  (Not in a hospital admission)    Current Facility-Administered Medications   Medication Dose Route Frequency    lidocaine-EPINEPHrine 1 %-1:100000 injection 10 mL  10 mL Intradermal Once    albuterol HFA (PROVENTIL, VENTOLIN, PROAIR HFA) inhaler 2 puff  2 puff Inhalation Q4H PRN    [START ON 10/26/2019] fluticasone (FLONASE) 50 MCG/ACT nasal spray 2 spray  2 spray Nasal Daily    ipratropium-albuterol (DUONEB) 0.5-2.5mg  /32mL nebulization solution 3 mL  3 mL Nebulization Q6H    pantoprazole (PROTONIX) 4 mg/ml injection 40 mg  40 mg Intravenous Q24H    ondansetron (ZOFRAN) 4 MG/2ML injection         Current Outpatient Medications   Medication    montelukast (SINGULAIR) 10 MG tablet    sertraline (ZOLOFT) 100 MG tablet    fluticasone-umeclidinium-vilanterol (TRELEGY ELLIPTA) 200-62.5-25 MCG/INH diskus inhaler    albuterol HFA (PROVENTIL, VENTOLIN, PROAIR HFA) 108 (90 Base) MCG/ACT inhaler    gabapentin (GABAPENTIN) 300 MG capsule    pantoprazole (PROTONIX) 20 MG EC tablet    azelastine (ASTELIN) 0.1 % nasal spray    Spacer/Aero-Holding Chambers (EASIVENT) spacer    fluticasone (FLONASE) 50 MCG/ACT nasal spray    Non-System Medication     ipratropium-albuterol (DUONEB) 0.5-2.5mg  /65mL nebulizer solution    ipratropium (ATROVENT) 0.06 % nasal spray    Misc. Devices (CPAP) machine    atorvastatin (LIPITOR) 40 MG tablet    Cholecalciferol (VITAMIN D3) 5000 UNITS TABS    docusate sodium (COLACE) 100 MG capsule    clopidogrel (PLAVIX) 75 MG tablet       Review of Systems:   Pertinent items noted above, otherwise negative    General Physical Examination  General appearance: alert and appears stated age  Head: normocephalic, right orbital hematoma, left scalp laceration  Lungs: nonlabored respirations bilaterally  Heart: regular rate and rhythm  Abdomen: soft, nontender, nondistended  Extremities: extremities normal, atraumatic, no cyanosis or edema  Pulses: 2+ and symmetric    Neurological Examination  GCS: E3V4M6  Mental Status: Awake and oriented x 3  Cranial Nerves: PERRL, EOMI, face symmetric without droop  Motor Exam: Strength 5/5 throughout bilateral upper and lower extremities RUE, RLE, LUE, LLE  Sensory Exam: Sensation intact grossly to light touch throughout bilateral upper and lower extremities  Reflexes: DTR's 2+, right clonus,  negative Hoffman    Lab Results:  Recent Labs     10/25/19  2048   WBC 18.6*   Hematocrit 41   Platelets 281   INR 1.0     No components found with this basename: BUN, CREATININE, GLUCOSE, APTT, OSMOLALITY, GLUCOSE      Imaging Studies (personal reads):  Computed Tomography:  CT Head: No acute hemorrhage or fracture  CT C-Spine: Type III odontoid fracture with extension into right lateral mass  CT T-Spine: No acute fracture  CT L-Spine: No acute fracture  CTA: No vertebral artery injury    - Cerebral edema: No  - Brain compression: No  - Coma: No  - Encephalopathy: No  - Spinal cord injury: No    Currently Active/Followed Hospital Problems:  Active Hospital Problems    Diagnosis     Closed  odontoid fracture with type III morphology, initial encounter        Assessment: 74 y.o. female with history of OSA, DVT, CAD,  CABG and obesity presenting after fall downstairs.  Imaging with type III odontoid fracture with extension into right lateral mass.  Patient complains of neck pain and noted to have clonus on examination otherwise full strength.  On Plavix.    Plan:   -C-collar at all times  -No spine precautions needed  -MRI without contrast of C-spine  -NPO until imaging completed  -Notify the Neurosurgery House Officer of any decline in this patient's neurologic exam.    Recommendations communicated to [place] at 2300.  Thank you for consulting Neurosurgery.    Author: Irven Shelling, MD, 10/25/2019 at 10:42 PM    Please page the Neurosurgery House Officer for any questions regarding this patient's care.      Addendum:    I saw and evaluated the patient. I agree with the resident's/fellow's findings and plan of care as documented above.    C collar management only.      Tillie Rung. Marianne Sofia, [place]  [phone]

## 2019-10-25 NOTE — ED Notes (Signed)
Bed: CC-61L  Expected date:   Expected time:   Means of arrival:   Comments:

## 2019-10-25 NOTE — ED Procedure Documentation (Signed)
Procedures   Venipuncture provider  Performed by: Gwenlyn Fudge, MD  Authorized by: Gwenlyn Fudge, MD     Consent:     Consent obtained:  Emergent situation  Universal protocol:     Required blood products, implants, devices, and special equipment available: yes      Patient identity confirmed:  Arm band and verbally with patient  Indications:     Indications:  IV access  Pre-procedure details:     Skin preparation:  ChloraPrep  Anesthesia (see MAR for exact dosages):     Anesthesia method:  None  Procedure details:     Vein location:  Cephalic    Vein laterality:  Left  Post-procedure details:     Patient tolerance of procedure:  Tolerated well, no immediate complications  Comments:      18g IV to L AC under US guidance, procedure performed by Milus Banister, MD PGY-2 under my direct supervision        Gwenlyn Fudge, MD     Gwenlyn Fudge, MD  10/25/19 2056

## 2019-10-25 NOTE — ED Procedure Documentation (Addendum)
Procedures   Laceration repair  Performed by: Milus Banister, MD  Authorized by: Gwenlyn Fudge, MD     Consent:     Consent obtained:  Verbal    Consent given by:  Patient  Universal protocol:     Procedure explained and questions answered to patient or proxy's satisfaction: yes      Patient identity confirmed:  Arm band and verbally with patient  Anesthesia (see MAR for exact dosages):     Anesthesia method:  Local infiltration    Local anesthetic:  Lidocaine 1% WITH epi  Laceration details:     Location:  Scalp    Scalp location:  Crown    Length (cm):  8  Pre-procedure details:     Preparation:  Patient was prepped and draped in usual sterile fashion  Exploration:     Wound exploration: wound explored through full range of motion    Treatment:     Area cleansed with:  Saline    Amount of cleaning:  Standard    Irrigation solution:  Sterile saline    Debridement:  None    Undermining:  None  Skin repair:     Repair method:  Staples    Number of staples:  13  Approximation:     Approximation:  Close  Post-procedure details:     Dressing:  Open (no dressing)    Patient tolerance of procedure:  Tolerated well, no immediate complications        Milus Banister, MD     Milus Banister, MD  Resident  10/25/19 2330    I was present and participated during the critical and key portions and was immediately available during the remainder of the procedure.    Gwenlyn Fudge, MD       Gwenlyn Fudge, MD  10/26/19 [phone]

## 2019-10-25 NOTE — ED Notes (Signed)
ED Trauma Nursing Note     Pt fell down 12-15 stairs. Pt hit head, on plavix. Pt complaining of HA, hematoma on right side of face. Left rib/thigh pain and neck pain. No LOC.      Donnetta Hutching, RN, 10/25/2019, 8:27 PM

## 2019-10-25 NOTE — ED Notes (Signed)
Bed: CC-61L  Expected date: 10/25/19  Expected time: 8:55 PM  Means of arrival:   Comments:

## 2019-10-25 NOTE — Progress Notes (Signed)
ED Social Work Note:    SW responded to Level TWO trauma alert. Patient presented to [facility] following a fall down 15 steps at her home. Patient alert and oriented upon arrival. Writer met with patient at bedside to inquire who she would like contacted on her behalf.  Patient asks that her daughter, [name] [phone]), be contacted.     Spoke with Joy who states that she is here at the hospital in her parking garage looking for an update. Family directed to come into the ED and placed in the quiet room. Support provided at length. Team made aware and asked to provide family with a medical update.     Emergency/ Family Contacts/Next of Kin:  Daughter, [name] [phone])        Linton Flemings, [place]  Emergency Department   10/25/2019  [phone] or [phone]

## 2019-10-25 NOTE — Provider Consult (Addendum)
Consultation  ________________________________________     Consult Requested by: Alberteen Sam, MD  Consult Attending: Andrew Au, MD  Consult Question: scalp laceration     Chief Complaint: I fell down    History of Present Illness: [name] is a 74 y.o. female pmh significant for CAD (CABG on plavix), COPD, OSA, DM who presents after falling down 12 stairs during a family party. Injuries include type III odontoid fracture, rib fractures, concern for aortic injury (CT angio pending), splenic laceration, concern for pancreatic injury.  Of note, she has a previous R orbital floor fracture that required operative repair in 2017 with Dr. Isidore Moos. Patient found to have scalp lacerations closed in the ED with staples. Plastic Surgery consulted due to concern for deep scalp lacerations needing further intervention.      Review of Systems: Negative except as noted in HPI. Constitutional: negative    Past Medical History:   Past Medical History:   Diagnosis Date    Allergic rhinitis 09/30/2014    Arthritis     Asthma     CAD (coronary artery disease)     DVT (deep venous thrombosis)     post-op after CABG, on anticoagulation for ~2 years    PE (pulmonary embolism)     post-op after CABG, on anticoagulation for ~2 years    Prediabetes     Tubular adenoma 03/2015    colonoscopy      Past Surgical History:   Past Surgical History:   Procedure Laterality Date    ACHILLES TENDON SURGERY      APPENDECTOMY      CHOLECYSTECTOMY      CORONARY ANGIOPLASTY WITH STENT PLACEMENT      CORONARY ARTERY BYPASS GRAFT  2013    triple     CYST REMOVAL      both thumbs    PR REPAIR EYE BLOWOUT,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;  Service: OMFS    TONSILLECTOMY AND ADENOIDECTOMY        Family History:   Family History   Problem Relation Age of Onset    Other Mother         "rare lung disease"    Aneurysm Mother          abdominal aortic aneurysm    Diabetes Type II Mother     Breast cancer Mother     Blood clots Mother     Stroke Father     Stroke Brother         x2    Thyroid cancer Daughter      Social History:   Social History     Tobacco Use    Smoking status: Never Smoker    Smokeless tobacco: Never Used   Substance Use Topics    Alcohol use: No      Medications:    [START ON 10/26/2019] electrolyte        [START ON 10/26/2019] fluticasone  2 spray Nasal Daily    ipratropium-albuterol  3 mL Nebulization Q6H    pantoprazole  40 mg Intravenous Q24H    ceFAZolin IV  2,000 mg Intravenous Once    [START ON 10/26/2019] APAP  1,000 mg Intravenous Q8H    [START ON 10/26/2019] lidocaine-EPINEPHrine  20 mL Other Once      albuterol HFA  2 puff Inhalation Q4H PRN    HYDROmorphone hcl  0.25 mg Intravenous Q2H PRN  sodium chloride  0-500 mL/hr Intravenous PRN    dextrose  0-500 mL/hr Intravenous PRN     Prior to Admission medications    Medication Sig Start Date End Date Taking? Authorizing Provider   montelukast (SINGULAIR) 10 MG tablet Take 1 tablet (10 mg total) by mouth nightly 10/12/19   Parmenter, Serafina Mitchell, NP   sertraline (ZOLOFT) 100 MG tablet Take 1 tablet (100 mg total) by mouth daily Replacing 50mg  dose, please cancel other refills 09/06/19   [name], MD   fluticasone-umeclidinium-vilanterol (TRELEGY ELLIPTA) 200-62.5-25 MCG/INH diskus inhaler Inhale 1 puff into the lungs daily 08/09/19   Brockway, Donnal Debar, NP   albuterol HFA (PROVENTIL, VENTOLIN, PROAIR HFA) 108 (90 Base) MCG/ACT inhaler Inhale 2 puffs into the lungs every 4-6 hours as needed 3/32/95   Reginold Agent, NP   gabapentin (GABAPENTIN) 300 MG capsule Take 1 capsule (300 mg total) by mouth 3 times daily 02/03/19   Daine Floras, NP   pantoprazole (PROTONIX) 20 MG EC tablet Take 1 tablet (20 mg total) by mouth daily 30 min before breakfast Swallow whole. Do not crush, break, or chew. 09/15/18   Juel Burrow, FNP   azelastine (ASTELIN) 0.1 %  nasal spray 1-2 sprays by Nasal route 2 times daily 09/15/18   Juel Burrow, FNP   Spacer/Aero-Holding Chambers (EASIVENT) spacer Use twice daily with Symbicort inhaler. Breathe in slowly. 08/13/18   Juel Burrow, FNP   fluticasone Asencion Islam) 50 MCG/ACT nasal spray 2 sprays by Nasal route daily 08/13/18   Juel Burrow, FNP   Non-System Medication Medication/Supply: R thumb spica brace  Directions for Use: wear nightly 01/09/18   [name], MD   ipratropium-albuterol (DUONEB) 0.5-2.5mg  /75mL nebulizer solution Take 3 mLs by nebulization every 6 hours 08/26/17   Verita Lamb, NP   ipratropium (ATROVENT) 0.06 % nasal spray 2 sprays by Each Nare route 3 times daily 12/11/16 08/06/19  Donette Larry, NP   Misc. Devices (CPAP) machine Download and compliance report 06/05/16   Petronaci, Sharene Butters, MD   atorvastatin (LIPITOR) 40 MG tablet Take 40 mg by mouth daily (with dinner)    [provider]   Cholecalciferol (VITAMIN D3) 5000 UNITS TABS Take 5,000 Units by mouth every morning       [provider]   docusate sodium (COLACE) 100 MG capsule Take 200 mg by mouth every morning       [provider]   clopidogrel (PLAVIX) 75 MG tablet Take 75 mg by mouth every morning       [provider]     Allergies:   Allergies   Allergen Reactions    Morphine Hives     Has tolerated Percocet        Physical Examination:  Patient Vitals for the past 4 hrs:   BP Temp Temp src Pulse Resp SpO2 Height Weight   10/25/19 2346 122/52 37.5 C (99.5 F) Axillary 83 18 91 %     10/25/19 2109 145/85 36 C (96.8 F)  87 18 100 %     10/25/19 2043 140/68   66 14 100 %     10/25/19 2036      100 %     10/25/19 2032 101/81   57 13      10/25/19 2030  35.8 C (96.4 F)         10/25/19 2021 169/70 36 C (96.8 F)  70 24 95 % 1.778 m (5\' 10" ) 120.2 kg (265 lb)  Body mass index is 38.02 kg/m.     General: in c-collar, responding to commands  Neuro: awake and oriented  CV:  RRR  Resp: unlabored breathing, on NC  Abd: soft  Ext: MAE    HEENT/Face/CN: The patient is able to cooperate with exam.  General appearance/lacerations: right periorbital hematoma hematoma, supraorbital epidermal abrasion. Left parietal scalp laceration with staples in place, right frontal scalp hematoma palpable  Cervical collar: yes  Visual acuity: grossly intact  Pupils: equal, round, reactive  EOM: intact without signs of entrapment  CNV: sensation intact in V1-3 bilaterall  CNVII: muscles of facial expression symmetric  Malocclusion: No  Bony stepoffs: No  Other: hearing intact grossly, palate elevates symmetrically, shoulder shrug/SCM symmetric, tongue midline                   Lab Results:  Recent Labs     10/25/19  2048   WBC 18.6*   Hemoglobin 12.3   Hematocrit 41   Platelets 281   aPTT 22.4*   Protime 11.6   INR 1.0    Recent Labs     10/25/19  2048   Creatinine 0.98*    Recent Labs     10/25/19  2048   Albumin 3.8      Imaging: Relevant imaging, where available, was reviewed. Of note, CT max face negative for acute facial fractures.       Impression: [name] is a 74 y.o. female pmh significant for CAD (CABG on plavix), COPD, OSA, DM who presents after falling down 12 stairs during a family party. Of note, she has a previous R orbital floor fracture that required operative repair in 2017 with Dr. Isidore Moos. Patient found to have scalp lacerations closed in the ED with staples. Plastic Surgery consulted due to concern for deep scalp lacerations needing further intervention.     Summary of Facial Fractures/Lacerations:   Right periorbital hematoma   Right supraorbital skin abrasion   Right frontal scalp hematoma   L parietal scalp laceration    Recommendations:   Trauma triage and evaluation per ED/ACS/primary team.   Would recommend against removing staples and reclosing scalp laceration. If scalp laceration breaks down, please let Plastic Surgery know and we can consider  intervention at that time   Keep HOB elevated at least 45 degrees to decrease swelling. No pressure to face.   Local wound care to lacerations with gentle cleansing and antibiotic ointment TID.   Please update tetanus vaccination if indicated.   Follow up with Dr. Waldron Session in 7-10 days after discharge. Call [phone] for an appointment.     Thank you for the opportunity to participate in this patient's care. Please contact the plastic surgery consult resident with questions or concerns.     Wilburn Cornelia, MD   Plastic and Reconstructive Surgery

## 2019-10-25 NOTE — Progress Notes (Signed)
SPIRITUAL ASSESSMENT NOTE    Chaplain Interventions: Silent Prayer and Presence    This visit was in response to an on-call page. I connected with the Education officer, museum. No family was present at this time.    Plan of Care:  There is no plan for spiritual care at this time.    [name]      8:57 PM on 10/25/2019

## 2019-10-26 ENCOUNTER — Inpatient Hospital Stay: Payer: Medicare (Managed Care)

## 2019-10-26 ENCOUNTER — Inpatient Hospital Stay: Payer: Medicare (Managed Care) | Admitting: Student in an Organized Health Care Education/Training Program

## 2019-10-26 ENCOUNTER — Other Ambulatory Visit: Payer: Self-pay | Admitting: Cardiology

## 2019-10-26 ENCOUNTER — Other Ambulatory Visit: Payer: Self-pay | Admitting: Cardiovascular Disease

## 2019-10-26 ENCOUNTER — Encounter: Payer: Self-pay | Admitting: Surgery

## 2019-10-26 ENCOUNTER — Encounter: Payer: Self-pay | Admitting: Primary Care

## 2019-10-26 DIAGNOSIS — S2242XA Multiple fractures of ribs, left side, initial encounter for closed fracture: Secondary | ICD-10-CM

## 2019-10-26 DIAGNOSIS — J948 Other specified pleural conditions: Secondary | ICD-10-CM

## 2019-10-26 DIAGNOSIS — N1330 Unspecified hydronephrosis: Secondary | ICD-10-CM

## 2019-10-26 DIAGNOSIS — I7102 Dissection of abdominal aorta: Secondary | ICD-10-CM

## 2019-10-26 DIAGNOSIS — D62 Acute posthemorrhagic anemia: Secondary | ICD-10-CM

## 2019-10-26 DIAGNOSIS — G8911 Acute pain due to trauma: Secondary | ICD-10-CM

## 2019-10-26 DIAGNOSIS — S36899A Unspecified injury of other intra-abdominal organs, initial encounter: Secondary | ICD-10-CM

## 2019-10-26 DIAGNOSIS — S12120A Other displaced dens fracture, initial encounter for closed fracture: Secondary | ICD-10-CM

## 2019-10-26 DIAGNOSIS — E878 Other disorders of electrolyte and fluid balance, not elsewhere classified: Secondary | ICD-10-CM

## 2019-10-26 DIAGNOSIS — J9811 Atelectasis: Secondary | ICD-10-CM

## 2019-10-26 DIAGNOSIS — R9431 Abnormal electrocardiogram [ECG] [EKG]: Secondary | ICD-10-CM

## 2019-10-26 DIAGNOSIS — S2509XA Other specified injury of thoracic aorta, initial encounter: Secondary | ICD-10-CM

## 2019-10-26 DIAGNOSIS — I2510 Atherosclerotic heart disease of native coronary artery without angina pectoris: Secondary | ICD-10-CM

## 2019-10-26 DIAGNOSIS — I499 Cardiac arrhythmia, unspecified: Secondary | ICD-10-CM

## 2019-10-26 LAB — ECHO COMPLETE
AV Area (LVOT SR Mtd): 0.72 cm2
AV Area (LVOT SV Mtd): 0.8 cm2
AV CWD VTI: 81.1 cm
AV CWD Velocity (Mean): 255.3 cm/s
AV CWD Velocity (Peak): 344.9 cm/s
AV Gradient (peak): 44.4 mmHg
Aortic Diameter (mid tubular): 3.9 cm
Aortic Diameter (sinus of Valsalva): 3.4 cm
BMI: 36.5 kg/m2
BP Diastolic: 68 mmHg
BP Systolic: 148 mmHg
BSA: 2.38 m2
EF: 70 %
Heart Rate: 74 {beats}/min
Height: 70 in
LA Diameter BSA Index: 1.6 cm/m2
LA Diameter Height Index: 2.2 cm/m
LA Diameter: 3.9 cm
LV ASE Mass BSA Index: 76.4 gm/m2
LV ASE Mass Height 2.7 Index: 38.5 gm/m2.7
LV ASE Mass Height Index: 102.3 gm/m
LV ASE Mass: 181.9 gm
LV Posterior Wall Thickness: 1.06 cm
LV Septal Thickness: 1.04 cm
LV wall/cavity ratio: 0.44
LVED Diameter BSA Index: 2.02 cm/m2
LVED Diameter Height Index: 2.7 cm/m
LVED Diameter: 4.8 cm
LVOT + AV Gradient (peak): 47.6 mmHg
LVOT Area (calculated): 2.8 cm2
LVOT Cardiac Index: 2.02 L/min/m2
LVOT Cardiac Output: 4.82 L/min
LVOT Diameter: 1.89 cm
LVOT PWD VTI: 23.22 cm
LVOT PWD Velocity (mean): 63.6 cm/s
LVOT PWD Velocity (peak): 89 cm/s
LVOT SV BSA Index: 27.36 mL/m2
LVOT SV Height Index: 36.6 mL/m
LVOT Stroke Rate (mean): 178.3 mL/s
LVOT Stroke Rate (peak): 249.6 mL/s
LVOT Stroke Volume: 65.11 cc
LVOT/AV Velocity Ratio: 0.26
Mean Gradient: 29.06 mmHg
Peak Gradient - TR: 23.5 mmHg
Peak Velocity - TR: 242.13 cm/s
RA Pressure Estimate: 10 mmHg
RR Interval: 810.81 ms
RV Peak Systolic Pressure: 33.5 mmHg
Weight (lbs): 253.75 [lb_av]
Weight: 4059.99 oz

## 2019-10-26 LAB — CBC AND DIFFERENTIAL
Baso # K/uL: 0 10*3/uL (ref 0.0–0.1)
Baso # K/uL: 0 10*3/uL (ref 0.0–0.1)
Baso # K/uL: 0.1 10*3/uL (ref 0.0–0.1)
Basophil %: 0.3 %
Basophil %: 0.3 %
Basophil %: 0.5 %
Eos # K/uL: 0 10*3/uL (ref 0.0–0.4)
Eos # K/uL: 0 10*3/uL (ref 0.0–0.4)
Eos # K/uL: 0 10*3/uL (ref 0.0–0.4)
Eosinophil %: 0 %
Eosinophil %: 0.1 %
Eosinophil %: 0.2 %
Hematocrit: 33 % — ABNORMAL LOW (ref 34–45)
Hematocrit: 36 % (ref 34–45)
Hematocrit: 43 % (ref 34–45)
Hemoglobin: 10.2 g/dL — ABNORMAL LOW (ref 11.2–15.7)
Hemoglobin: 11.5 g/dL (ref 11.2–15.7)
Hemoglobin: 12.8 g/dL (ref 11.2–15.7)
IMM Granulocytes #: 0.1 10*3/uL — ABNORMAL HIGH (ref 0.0–0.0)
IMM Granulocytes #: 0.1 10*3/uL — ABNORMAL HIGH (ref 0.0–0.0)
IMM Granulocytes #: 0.2 10*3/uL — ABNORMAL HIGH (ref 0.0–0.0)
IMM Granulocytes: 0.5 %
IMM Granulocytes: 0.6 %
IMM Granulocytes: 0.8 %
Lymph # K/uL: 0.5 10*3/uL — ABNORMAL LOW (ref 1.2–3.7)
Lymph # K/uL: 0.9 10*3/uL — ABNORMAL LOW (ref 1.2–3.7)
Lymph # K/uL: 1 10*3/uL — ABNORMAL LOW (ref 1.2–3.7)
Lymphocyte %: 3.3 %
Lymphocyte %: 5.3 %
Lymphocyte %: 5.7 %
MCH: 28 pg (ref 26–32)
MCH: 29 pg (ref 26–32)
MCH: 29 pg (ref 26–32)
MCHC: 30 g/dL — ABNORMAL LOW (ref 32–36)
MCHC: 31 g/dL — ABNORMAL LOW (ref 32–36)
MCHC: 32 g/dL (ref 32–36)
MCV: 92 fL (ref 79–95)
MCV: 93 fL (ref 79–95)
MCV: 93 fL (ref 79–95)
Mono # K/uL: 0.6 10*3/uL (ref 0.2–0.9)
Mono # K/uL: 0.9 10*3/uL (ref 0.2–0.9)
Mono # K/uL: 1.4 10*3/uL — ABNORMAL HIGH (ref 0.2–0.9)
Monocyte %: 4.3 %
Monocyte %: 5.7 %
Monocyte %: 7.1 %
Neut # K/uL: 13.2 10*3/uL — ABNORMAL HIGH (ref 1.6–6.1)
Neut # K/uL: 13.3 10*3/uL — ABNORMAL HIGH (ref 1.6–6.1)
Neut # K/uL: 16.8 10*3/uL — ABNORMAL HIGH (ref 1.6–6.1)
Nucl RBC # K/uL: 0 10*3/uL (ref 0.0–0.0)
Nucl RBC # K/uL: 0 10*3/uL (ref 0.0–0.0)
Nucl RBC # K/uL: 0 10*3/uL (ref 0.0–0.0)
Nucl RBC %: 0 /100 WBC (ref 0.0–0.2)
Nucl RBC %: 0 /100 WBC (ref 0.0–0.2)
Nucl RBC %: 0 /100 WBC (ref 0.0–0.2)
Platelets: 210 10*3/uL (ref 160–370)
Platelets: 220 10*3/uL (ref 160–370)
Platelets: 249 10*3/uL (ref 160–370)
RBC: 3.5 MIL/uL — ABNORMAL LOW (ref 3.9–5.2)
RBC: 3.9 MIL/uL (ref 3.9–5.2)
RBC: 4.6 MIL/uL (ref 3.9–5.2)
RDW: 13.2 % (ref 11.7–14.4)
RDW: 13.2 % (ref 11.7–14.4)
RDW: 13.5 % (ref 11.7–14.4)
Seg Neut %: 86.1 %
Seg Neut %: 87.6 %
Seg Neut %: 91.6 %
WBC: 14.6 10*3/uL — ABNORMAL HIGH (ref 4.0–10.0)
WBC: 15 10*3/uL — ABNORMAL HIGH (ref 4.0–10.0)
WBC: 19.5 10*3/uL — ABNORMAL HIGH (ref 4.0–10.0)

## 2019-10-26 LAB — BASIC METABOLIC PANEL
Anion Gap: 16 (ref 7–16)
Anion Gap: 9 (ref 7–16)
CO2: 18 mmol/L — ABNORMAL LOW (ref 20–28)
CO2: 26 mmol/L (ref 20–28)
Calcium: 9.2 mg/dL (ref 8.6–10.2)
Calcium: 8.3 mg/dL — ABNORMAL LOW (ref 8.6–10.2)
Chloride: 103 mmol/L (ref 96–108)
Chloride: 107 mmol/L (ref 96–108)
Creatinine: 0.84 mg/dL (ref 0.51–0.95)
Creatinine: 0.77 mg/dL (ref 0.51–0.95)
GFR,Black: 80 *
GFR,Black: 89 *
GFR,Caucasian: 69 *
GFR,Caucasian: 77 *
Glucose: 142 mg/dL — ABNORMAL HIGH (ref 60–99)
Glucose: 144 mg/dL — ABNORMAL HIGH (ref 60–99)
Lab: 19 mg/dL (ref 6–20)
Lab: 16 mg/dL (ref 6–20)
Potassium: 4.4 mmol/L (ref 3.3–5.1)
Sodium: 137 mmol/L (ref 133–145)
Sodium: 142 mmol/L (ref 133–145)

## 2019-10-26 LAB — MCHC
MCHC: 30 g/dL — ABNORMAL LOW (ref 32–36)
MCHC: 31 g/dL — ABNORMAL LOW (ref 32–36)

## 2019-10-26 LAB — TROPONIN T 3 HR W/ DELTA HIGH SENSITIVITY (IP/ED ONLY)
HS TROP % Change: -8 % — ABNORMAL LOW (ref 0–20)
TROP T 0-3 HR DELTA High Sensitivity: -2 — ABNORMAL LOW (ref 0–7)
TROP T 3 HR High Sensitivity: 23 ng/L — ABNORMAL HIGH (ref 0–13)

## 2019-10-26 LAB — EKG 12-LEAD
P: 25 deg
PR: 248 ms
QRS: 33 deg
QRSD: 98 ms
QT: 392 ms
QTc: 438 ms
Rate: 75 {beats}/min
T: 53 deg

## 2019-10-26 LAB — PLATELET MAPPING
A Angle: 77.3 deg — ABNORMAL HIGH (ref 53.0–73.0)
Coagulation Index: 4.4 — ABNORMAL HIGH (ref <=3.0)
K Time: 0.8 min — ABNORMAL LOW (ref 1.0–3.0)
LY30: 1.5 % (ref 0.0–8.0)
Maximum Amplitude: 70.7 mm (ref 50.0–72.0)
Platelet Mapping ADP: 31.3 mm
Platelet Mapping Arachadonic Acid: 64.4 mm
R Time: 3.2 min — ABNORMAL LOW (ref 4.0–10.0)

## 2019-10-26 LAB — PERFORMING LAB

## 2019-10-26 LAB — HCT AND HGB
Hematocrit: 38 % (ref 34–45)
Hematocrit: 37 % (ref 34–45)
Hemoglobin: 11.3 g/dL (ref 11.2–15.7)
Hemoglobin: 11.5 g/dL (ref 11.2–15.7)

## 2019-10-26 LAB — TEG REVIEW

## 2019-10-26 LAB — REVIEWD BY:

## 2019-10-26 LAB — PHOSPHORUS
Phosphorus: 4.3 mg/dL (ref 2.7–4.5)
Phosphorus: 3.6 mg/dL (ref 2.7–4.5)

## 2019-10-26 LAB — MAGNESIUM
Magnesium: 2.1 mg/dL (ref 1.6–2.5)
Magnesium: 1.8 mg/dL (ref 1.6–2.5)

## 2019-10-26 LAB — TROPONIN T 0 HR HIGH SENSITIVITY (IP/ED ONLY): TROP T 0 HR High Sensitivity: 25 ng/L — ABNORMAL HIGH (ref 0–13)

## 2019-10-26 LAB — COVID-19 NAAT (PCR): COVID-19 NAAT (PCR): NEGATIVE

## 2019-10-26 LAB — HOLD BLUE, WHOLE BLOOD

## 2019-10-26 LAB — COVID-19 PCR

## 2019-10-26 LAB — INTERPRETATION, TEG

## 2019-10-26 MED ORDER — ONDANSETRON HCL 2 MG/ML IV SOLN *I*
INTRAMUSCULAR | Status: AC
Start: 2019-10-26 — End: 2019-10-26
  Administered 2019-10-26: 4 mg via INTRAVENOUS
  Filled 2019-10-26: qty 2

## 2019-10-26 MED ORDER — PANTOPRAZOLE SODIUM 20 MG PO TBEC *I*
20.0000 mg | DELAYED_RELEASE_TABLET | Freq: Every morning | ORAL | Status: DC
Start: 2019-10-27 — End: 2019-11-09
  Administered 2019-10-27 – 2019-11-09 (×14): 20 mg via ORAL
  Filled 2019-10-26 (×15): qty 1

## 2019-10-26 MED ORDER — BUPIVACAINE HCL 0.25% IJ SOLN (FOR OSM) *I*
Status: DC | PRN
Start: 2019-10-26 — End: 2019-10-26
  Administered 2019-10-26: 15 mL via PERINEURAL

## 2019-10-26 MED ORDER — SODIUM CHLORIDE 0.9 % INJ (FLUSH) WRAPPED *I*
20.0000 mL | Status: DC | PRN
Start: 2019-10-26 — End: 2019-10-28

## 2019-10-26 MED ORDER — HYDRALAZINE HCL 20 MG/ML IJ SOLN *I*
INTRAMUSCULAR | Status: DC
Start: 2019-10-26 — End: 2019-10-26
  Filled 2019-10-26: qty 1

## 2019-10-26 MED ORDER — SERTRALINE HCL 100 MG PO TABS *I*
100.0000 mg | ORAL_TABLET | Freq: Every day | ORAL | Status: DC
Start: 2019-10-26 — End: 2019-11-09
  Administered 2019-10-26 – 2019-11-09 (×15): 100 mg via ORAL
  Filled 2019-10-26 (×16): qty 1

## 2019-10-26 MED ORDER — BUDESONIDE 0.25 MG/2ML IN SUSP *I*
0.5000 mg | Freq: Two times a day (BID) | RESPIRATORY_TRACT | Status: DC
Start: 2019-10-26 — End: 2019-10-29
  Administered 2019-10-26 – 2019-10-29 (×6): 0.5 mg via RESPIRATORY_TRACT
  Filled 2019-10-26 (×8): qty 4

## 2019-10-26 MED ORDER — BISACODYL 10 MG RE SUPP *I*
10.0000 mg | Freq: Every day | RECTAL | Status: DC | PRN
Start: 2019-10-26 — End: 2019-10-28

## 2019-10-26 MED ORDER — ALBUTEROL SULFATE (2.5 MG/3ML) 0.083% IN NEBU *I*
2.5000 mg | INHALATION_SOLUTION | Freq: Four times a day (QID) | RESPIRATORY_TRACT | Status: DC | PRN
Start: 2019-10-26 — End: 2019-11-09

## 2019-10-26 MED ORDER — SODIUM CHLORIDE 0.9 % INJ (FLUSH) WRAPPED *I*
10.0000 mL | Status: DC | PRN
Start: 2019-10-26 — End: 2019-10-28

## 2019-10-26 MED ORDER — HYDRALAZINE HCL 20 MG/ML IJ SOLN *I*
10.0000 mg | Freq: Once | INTRAMUSCULAR | Status: AC
Start: 2019-10-26 — End: 2019-10-26
  Administered 2019-10-26: 10 mg via INTRAVENOUS

## 2019-10-26 MED ORDER — POLYETHYLENE GLYCOL 3350 PO PACK 17 GM *I*
17.0000 g | PACK | Freq: Every day | ORAL | Status: DC
Start: 2019-10-26 — End: 2019-10-28
  Administered 2019-10-26 – 2019-10-27 (×2): 17 g via ORAL
  Filled 2019-10-26 (×2): qty 17

## 2019-10-26 MED ORDER — BUDESONIDE-FORMOTEROL FUMARATE 160-4.5 MCG/ACT IN AERO *I*
2.0000 | INHALATION_SPRAY | Freq: Two times a day (BID) | RESPIRATORY_TRACT | Status: DC
Start: 2019-10-26 — End: 2019-10-26
  Administered 2019-10-26: 2 via RESPIRATORY_TRACT
  Filled 2019-10-26: qty 6

## 2019-10-26 MED ORDER — SODIUM CHLORIDE 0.9 % IV SOLN WRAPPED *I*
3.0000 mL/h | Status: DC
Start: 2019-10-26 — End: 2019-10-26
  Administered 2019-10-26: 3 mL/h via INTRAVENOUS

## 2019-10-26 MED ORDER — DEXAMETHASONE SODIUM PHOSPHATE 4 MG/ML INJ SOLN *WRAPPED*
INTRAMUSCULAR | Status: DC | PRN
Start: 2019-10-26 — End: 2019-10-26
  Administered 2019-10-26: 4 mg via PERINEURAL

## 2019-10-26 MED ORDER — LIDOCAINE 5 % EX PTCH *I*
1.0000 | MEDICATED_PATCH | CUTANEOUS | Status: DC
Start: 2019-10-26 — End: 2019-10-26
  Administered 2019-10-26: 1 via TRANSDERMAL
  Filled 2019-10-26: qty 1

## 2019-10-26 MED ORDER — HYDROMORPHONE HCL PF 1 MG/ML IJ SOLN *WRAPPED*
INTRAMUSCULAR | Status: AC
Start: 2019-10-26 — End: 2019-10-26
  Administered 2019-10-26: 0.25 mg via INTRAVENOUS
  Filled 2019-10-26: qty 0.5

## 2019-10-26 MED ORDER — PERFLUTREN PROTEIN A MICROSPH (OPTISON) IV SUSP *I*
1.5000 mL | INTRAVENOUS | Status: AC | PRN
Start: 2019-10-26 — End: 2019-10-27
  Administered 2019-10-26: 1.5 mL via INTRAVENOUS

## 2019-10-26 MED ORDER — SODIUM CHLORIDE 0.9 % IV SOLN WRAPPED *I*
10.0000 mL/h | INTRAMUSCULAR | Status: DC
Start: 2019-10-26 — End: 2019-10-28
  Administered 2019-10-26 – 2019-10-28 (×4): 10 mL/h via PERINEURAL
  Filled 2019-10-26 (×5): qty 41.67

## 2019-10-26 MED ORDER — OXYCODONE HCL 5 MG PO TABS *I*
5.0000 mg | ORAL_TABLET | ORAL | Status: DC | PRN
Start: 2019-10-26 — End: 2019-10-27
  Administered 2019-10-26 (×2): 5 mg via ORAL
  Filled 2019-10-26 (×2): qty 1

## 2019-10-26 MED ORDER — HYDROMORPHONE HCL PF 1 MG/ML IJ SOLN *WRAPPED*
0.5000 mg | INTRAMUSCULAR | Status: DC | PRN
Start: 2019-10-26 — End: 2019-10-27
  Administered 2019-10-26 – 2019-10-27 (×3): 0.5 mg via INTRAVENOUS
  Filled 2019-10-26 (×3): qty 0.5

## 2019-10-26 MED ORDER — MAGNESIUM SULFATE 2 GM IN 50 ML *WRAPPED*
2000.0000 mg | Freq: Once | INTRAVENOUS | Status: AC
Start: 2019-10-26 — End: 2019-10-26
  Administered 2019-10-26: 2000 mg via INTRAVENOUS
  Filled 2019-10-26: qty 50

## 2019-10-26 MED ORDER — LIDOCAINE HCL 1 % IJ SOLN *I*
INTRAMUSCULAR | Status: DC | PRN
Start: 2019-10-26 — End: 2019-10-26
  Administered 2019-10-26: 3 mL via SUBCUTANEOUS

## 2019-10-26 MED ORDER — ONDANSETRON HCL 2 MG/ML IV SOLN *I*
4.0000 mg | Freq: Four times a day (QID) | INTRAMUSCULAR | Status: DC | PRN
Start: 2019-10-26 — End: 2019-11-09
  Administered 2019-10-26 – 2019-11-08 (×11): 4 mg via INTRAVENOUS
  Filled 2019-10-26 (×12): qty 2

## 2019-10-26 MED ORDER — OXYCODONE HCL 10 MG PO TABS *I*
10.0000 mg | ORAL_TABLET | ORAL | Status: DC | PRN
Start: 2019-10-26 — End: 2019-10-27
  Administered 2019-10-27 (×2): 10 mg via ORAL
  Filled 2019-10-26 (×2): qty 1

## 2019-10-26 MED ORDER — SENNOSIDES 8.6 MG PO TABS *I*
2.0000 | ORAL_TABLET | Freq: Two times a day (BID) | ORAL | Status: DC
Start: 2019-10-26 — End: 2019-11-05
  Administered 2019-10-26 – 2019-11-04 (×18): 2 via ORAL
  Filled 2019-10-26 (×18): qty 2

## 2019-10-26 MED ORDER — IOHEXOL 350 MG/ML (OMNIPAQUE) IV SOLN *I*
1.0000 mL | Freq: Once | INTRAVENOUS | Status: AC
Start: 2019-10-26 — End: 2019-10-26
  Administered 2019-10-26: 145 mL via INTRAVENOUS

## 2019-10-26 MED ORDER — BUPIVACAINE HCL 0.5 % IJ SOLN (OSM PERINEURAL) *I*
INTRAMUSCULAR | Status: DC | PRN
Start: 2019-10-26 — End: 2019-10-26
  Administered 2019-10-26: 15 mL via PERINEURAL

## 2019-10-26 MED ORDER — HYDROMORPHONE HCL PF 1 MG/ML IJ SOLN *WRAPPED*
0.2500 mg | Freq: Once | INTRAMUSCULAR | Status: AC
Start: 2019-10-26 — End: 2019-10-26

## 2019-10-26 MED ORDER — BACITRACIN-POLYMYXIN B 500-10000 UNIT/GM EX OINT *I*
TOPICAL_OINTMENT | Freq: Two times a day (BID) | CUTANEOUS | Status: DC
Start: 2019-10-26 — End: 2019-11-09
  Filled 2019-10-26 (×10): qty 28

## 2019-10-26 MED ORDER — LACTATED RINGERS IV BOLUS *I*
1000.0000 mL | Freq: Once | INTRAVENOUS | Status: AC
Start: 2019-10-26 — End: 2019-10-26
  Administered 2019-10-26: 1000 mL via INTRAVENOUS

## 2019-10-26 NOTE — Comprehensive Assessment (Signed)
Adult Social Work Initial Assessment    Assessment completed with daughter, [name], on phone. Latoya stated the family was at a relative's home for a party and patient lost her footing and fell down the basement stairs. Latoya stated that patient had just moved into a new apartment. Latoya stated that patient's spouse of 32 years passed away in 06-22-22 and she is having difficulty coping. Latoya stated that patient is independent at baseline. Latoya stated it would be helpful for the patient's state of mind to have someone with her overnight and writer stated if patient wants to have longer visiting hours to let either nursing or social know and an exception can be requested.     Demographics:  Religious Beliefs: [place] of Residence: [place]  Marital status: Widowed (spouse of 31 years passed in 2022/06/22)  Primary Language: English  Primary Care Taker of?: No one    Risk Factors:  Risk Factors: Age related issues, Adjustment to Dx/Injury/Illness, Current or planned facility placement    Advance Directive:  *Has patient (or family) completed any of the following? (select all that apply): Health Care Proxy (HCP)  HCP Name: [name]  HCP Phone Number: [phone]  HCP available for inclusion in the chart?: Yes  HCP in chart: Yes  *Would they like to discuss any issues related to MOLST, DNR Order, HCP, Living Will, or POA?: No  *Health Care Directive teaching done: No    Personal Contacts/Support System:  Spokesperson Name: [name]  Relationship to Patient : Daughter  Phone Number(s): [phone]  Has Spokesperson been notified of admission?: Yes  Method of Notification: In Person  Alternate Spokesperson?: Yes  Spokesperson Alternate: [name]  Relationship to Patient : Daughter  Phone Number(s): [phone]  Is spokesperson the patient's caregiver after discharge?: Unsure    Living Situation:  Lives With: Alone  Can they assist patient after discharge?:  (patient has three children who live locally)  Primary Care  Taker of?: No one    Home Geography:  Type of Home: Apartment  # Of Steps In Home: 0  # Steps to Enter Home:  (elevator)  Bedroom: First floor  Bathroom: First floor - full  Utilitites Working: Yes    Psychosocial:  Person assessed: Family  Coping Status: Difficultly  Emotional Impression: Depressed  Current Goal of Care: Rehab  Preferences Regarding Death: Family Presence (comment), Rituals (comment)  Anticipatory Grief / Bereavement Risk: Normal risk    Alcohol Assessment:  > 0 ETOH drinks in past month: No    Substance Abuse (Not including alcohol):  Other Substance Abuse: No  No    Baseline ADL functioning:  Transfers: Independent  Ambulation: Independent  Assistive Device: none  Bathing/Grooming: Independent  Meal Prep: Independent  Able to feed self?: Yes  Household maintenance/chores: Independent  Able to drive?: Yes    Income Information:  Vocational: Retired  Income Situation: Social security retirement  Insurance Information: Clinical cytogeneticist, Medicaid  Prescription Coverage: and has  Served in [place] military: No  Is patient OPWDD connected? : No  Is the patient presumed eligible for OPWDD services?: No    Home Care Services:  Do you currently have home care services?: No (was supposed to have grief counseling - not started yet)     Home Oxygen:  Do you have home oxygen?: No     SW will continue to monitor patient's hospital course and assist with discharge planning as patient progresses medically and with input from PT/OT.  Morene Crocker, LMSW   3:47 PM on 10/26/2019

## 2019-10-26 NOTE — Progress Notes (Signed)
BT[facility] Progress Note     LOS: 2 days     24 hour events:   - Urology consulted: Low concern for ureteral injury or other injury to urinary tract  - C-collar changed to [facility]  - L erector spinae catheter placed by APS, pain improved   - SBP 120s-150s    Objective Section:  Vitals   BP: (85-155)/(42-75)   Temp:  [35.8 C (96.4 F)-37.3 C (99.1 F)]   Temp src: Foley (06/16 0400)  Heart Rate:  [67-83]   Resp:  [11-19]   SpO2:  [81 %-100 %]   Height:  [177.8 cm (5\' 10" )]   Weight:  [115.1 kg (253 lb 12 oz)]   Intake/Output     Intake/Output Summary (Last 24 hours) at 10/27/2019 0454  Last data filed at 10/27/2019 0359  Gross per 24 hour   Intake 2315.85 ml   Output 2975 ml   Net -659.15 ml         Imaging findings last 48 hours:   EKG 12 lead    Result Date: 10/26/2019  Sinus rhythm Prolonged PR interval Nonspecific T abnormalities, diffuse leads    CT angio chest    Addendum Date: 10/26/2019    -------- ADDENDUM #2 -------- Findings discussed by phone with Dr. Melene Muller from vascular surgery 10/26/2019 at approximately 6:25 AM. Follow-up is deferred to the vascular surgery service. [facility] submits this DICOM format image data and final report to the [facility], an independent secure electronic health information exchange, on a reciprocally searchable basis (with patient authorization) for a minimum of 12 months after exam date.-------- ADDENDUM #1 -------- While overall there is less periaortic hematoma, much of the decrease is seen more inferiorly; this hematoma appears slightly increased adjacent to some atelectatic lung on 501-243 which may be due to redistribution. No contrast extravasation. Vascular surgery consultation would be helpful. Would recommend reimaging with CTA at 48 hours from this exam, followed by CTA in one month if findings are stable or improving. Discussed by phone with Glorianne Manchester 6:10 AM 10/26/2019. [facility] submits this DICOM format image data and final report to the [facility], an  independent secure electronic health information exchange, on a reciprocally searchable basis (with patient authorization) for a minimum of 12 months after exam date.    Addendum Date: 10/26/2019    -------- ADDENDUM #1 -------- While overall there is less periaortic hematoma, much of the decrease is seen more inferiorly; this hematoma appears slightly increased adjacent to some atelectatic lung on 501-243 which may be due to redistribution. No contrast extravasation. Vascular surgery consultation would be helpful. Would recommend reimaging with CTA at 48 hours from this exam, followed by CTA in one month if findings are stable or improving. Discussed by phone with Glorianne Manchester 6:10 AM 10/26/2019. [facility] submits this DICOM format image data and final report to the [facility], an independent secure electronic health information exchange, on a reciprocally searchable basis (with patient authorization) for a minimum of 12 months after exam date.    Result Date: 10/26/2019  There is a 5 mm linear filling defect in the aortic arch posterior medially just distal to the left subclavian artery origin without contour deformity or surrounding hematoma, compatible with intimal injury. This is categorized as minimal aortic injury which is amenable to nonoperative or medical management. Generally, suggested follow-up CTA is at 48 to 72 hours, then repeat CTA at 1 month. This finding has not changed significantly in retrospect  compared to 10/25/2019 at 9:02 PM. Note that these injuries are associated with periaortic hematomas. Interval slight decrease in size of the periaortic hematoma involving the mid to lower thoracic and upper abdominal aorta. Aorta at these levels appears normal. No contrast extravasation. Left-sided rib fractures with small extra pleural hematoma. Small focal areas of groundglass opacities in the left lung apex compatible with pulmonary contusion, not significantly changed. END OF IMPRESSION I have  personally reviewed the images and the Resident's/Fellow's interpretation and agree with or edited the findings. [facility] submits this DICOM format image data and final report to the [facility], an independent secure electronic health information exchange, on a reciprocally searchable basis (with patient authorization) for a minimum of 12 months after exam date.    CT angio neck carotid    Result Date: 10/25/2019  No evidence of acute carotid or vertebral artery dissection in the neck. Severe left and moderate-to-severe right internal carotid narrowing just distal to the bifurcation likely due to chronic atherosclerotic changes with greater than 70% stenosis bilaterally. END OF IMPRESSION I have personally reviewed the images and the Resident's/Fellow's interpretation and agree with or edited the findings. [facility] submits this DICOM format image data and final report to the [facility], an independent secure electronic health information exchange, on a reciprocally searchable basis (with patient authorization) for a minimum of 12 months after exam date.    CT abdomen and pelvis with IV contrast    Result Date: 10/25/2019  There is fat stranding and possible extravasation from the left lumbar vein and thickening of the medial crus of the left hemidiaphragm. Adjacent to this site, there is a moderate degree of fat stranding around the aorta. This stranding is likely an extension of the injury to the left lumbar vein with venous bleeding. However, an aortic injury cannot be entirely ruled out. Small splenic laceration and perisplenic hematoma, consistent with a low grade splenic injury. 3 cm hematoma adjacent to the pancreatic tail may represent a low grade pancreatic tail injury. Mild endometrial thickening/low-attenuation material in the endometrial canal, more than expected for age. Recommend further evaluation with pelvic ultrasound on a nonemergent basis. Significant findings were conveyed to Dr.  Marijean Bravo via phone by Dr. Oleta Mouse at 10:03 PM on 10/25/2019. END OF IMPRESSION I have personally reviewed the images and the Resident's/Fellow's interpretation and agree with or edited the findings. [facility] submits this DICOM format image data and final report to the [facility], an independent secure electronic health information exchange, on a reciprocally searchable basis (with patient authorization) for a minimum of 12 months after exam date.    CT chest with IV contrast    Result Date: 10/26/2019  Mildly displaced left posterior second rib fracture with small adjacent extrapleural hematoma. There are additional nondisplaced fractures of the posterior left fifth and sixth ribs. Few small areas of groundglass opacities within the left lung apex, likely representing pulmonary contusion/hemorrhage. There is soft tissue stranding around the mid descending thoracic aorta with extension to the level of the abdominal aorta. This may represent small hematoma formation. No active extravasation is visualized. This can be from a venous injury or adjacent focal diaphragmatic injury. If clinically indicated, a repeat CT can be obtained in 12-24 hours with delayed phase images to evaluate for any persistent venous or occult aortic injury. No contour abnormality in thoracic aorta. No aortic pseudoaneurysm formation. Please see separately dictated CT abdomen and pelvis report for findings below the diaphragm and CT thoracic  spine report for additional details. END OF IMPRESSION I have personally reviewed the images and the Resident's/Fellow's interpretation and agree with or edited the findings. [facility] submits this DICOM format image data and final report to the [facility], an independent secure electronic health information exchange, on a reciprocally searchable basis (with patient authorization) for a minimum of 12 months after exam date.    CT maxillofacial without contrast    Result Date: 10/25/2019  No acute  fracture of the maxillofacial skeleton. Partially visualized type III dens fracture. Please refer to separately dictated CT cervical spine for further description.. END OF IMPRESSION [facility] submits this DICOM format image data and final report to the [facility], an independent secure electronic health information exchange, on a reciprocally searchable basis (with patient authorization) for a minimum of 12 months after exam date.    CT head without contrast for TRAUMA    Result Date: 10/25/2019  No acute intracranial abnormality. No acute intracranial hemorrhage. No displaced calvarial fracture. Partly visualized likely type III odontoid fracture. Please see separately dictated CT cervical spine for further evaluation. Large approximate 9 cm hematoma at the right frontal scalp and a large laceration to the left parietal scalp as described above. Please see additional separately dictated CT reports for further evaluation Findings discussed with covering provider Dr. Ernie Hew via phone at 9:20 PM on 10/25/2019. END OF IMPRESSION I have personally reviewed the images and the Resident's/Fellow's interpretation and agree with or edited the findings. [facility] submits this DICOM format image data and final report to the [facility], an independent secure electronic health information exchange, on a reciprocally searchable basis (with patient authorization) for a minimum of 12 months after exam date.    Chest single frontal view    Result Date: 10/25/2019  Mildly displaced left upper rib fractures, involving at least the second rib. Small adjacent extrapleural hematoma Hypoexpanded lungs. END OF IMPRESSION I have personally reviewed the images and the Resident's/Fellow's interpretation and agree with or edited the findings. [facility] submits this DICOM format image data and final report to the [facility], an independent secure electronic health information exchange, on a reciprocally searchable basis (with  patient authorization) for a minimum of 12 months after exam date.    CT lumbar spine reprocess from CT    Result Date: 10/25/2019  No acute lumbosacral fracture. Please see the concurrent CT abdomen and pelvis report for a complete discussion of intra-abdominal findings. END OF IMPRESSION I have personally reviewed the images and the Resident's/Fellow's interpretation and agree with or edited the findings. [facility] submits this DICOM format image data and final report to the [facility], an independent secure electronic health information exchange, on a reciprocally searchable basis (with patient authorization) for a minimum of 12 months after exam date.    CT thoracic spine reprocess from CT    Result Date: 10/25/2019  Motion artifact degrades image quality and limits sensitivity of the examination. No acute thoracic spine fracture. END OF IMPRESSION [facility] submits this DICOM format image data and final report to the [facility], an independent secure electronic health information exchange, on a reciprocally searchable basis (with patient authorization) for a minimum of 12 months after exam date.    CT angio abdomen and pelvis    Result Date: 10/26/2019  Stable fat stranding/hematoma about the upper abdominal aorta. There is increased hematoma in the left retroperitoneum and abutting the psoas muscle, but this does not appear to be associated  with the abdominal aorta and may be associated with venous bleed. Discussed with Dr. Virl Cagey from vascular surgery by phone 6:25 AM 10/26/2019. There is no evidence of extravasation of contrast from the abdominal aorta . No direct injury seen to the abdominal aorta. There is some wall thickening of the proximal to mid ureter without contrast extravasation, suspicious for possible mural injury to the ureter. There is mild associated left hydronephrosis. Consider short interval follow-up ultrasound rule out increasing  hydronephrosis. Stable 3 cm hematoma adjacent  pancreatic tail. Follow-up of lipase/amylase would be helpful to rule out any posttraumatic pancreatitis involving the pancreatic tail. Small hematoma along left lower abdominal wall laterally. Small amount of blood now in the left anterior pelvis just deep to the ventral abdominal wall. No active bleeding visualized. END OF IMPRESSION I have personally reviewed the images and the Resident's/Fellow's interpretation and agree with or edited the findings. [facility] submits this DICOM format image data and final report to the [facility], an independent secure electronic health information exchange, on a reciprocally searchable basis (with patient authorization) for a minimum of 12 months after exam date.    CT Cervical Spine Reprocess from CT    Result Date: 10/25/2019  Acute mildly displaced type III odontoid fracture without narrowing of the central spinal canal. Please see additional separately dictated CT reports. Findings discussed with covering provider Dr. Ernie Hew via phone at 9:20 PM on 10/25/2019. END OF IMPRESSION I have personally reviewed the images and the Resident's/Fellow's interpretation and agree with or edited the findings. [facility] submits this DICOM format image data and final report to the [facility], an independent secure electronic health information exchange, on a reciprocally searchable basis (with patient authorization) for a minimum of 12 months after exam date.      Scheduled Meds:   budesonide  0.5 mg Nebulization 2 times per day    bacitracin zinc-polymyxin B sulfate   Topical 2 times per day    pantoprazole  20 mg Oral QAM    sertraline  100 mg Oral Daily    senna  2 tablet Oral 2 times per day    polyethylene glycol  17 g Oral Daily    ipratropium-albuterol  3 mL Nebulization Q6H    APAP  1,000 mg Intravenous Q8H      PRN Meds:ondansetron, May use line (invasive line) **AND** sodium chloride **AND** sodium chloride, HYDROmorphone hcl, perflutren protein A microsph,  albuterol, oxyCODONE, oxyCODONE, bisacodyl, sodium chloride, dextrose     Physical Exam by Systems:  Gen: awake and alert, NAD, resting in bed  HEENT: [place] J collar in place  Resp: CTAB, normal effort, saturating well on 2L NC  CV: RRR  Abd: soft, NDNT  Extremities: WWP, no deformities   Neuro: oriented x 3, follows commands, no focal deficits    Assessment: 74 y.o. female L2T fall from 15 stairs 6/15    Type III odontoid fx  Scalp lac   Periaortic fat stranding   ?lumbar vein extrav  Splenic lac,   ?Pancreatic injury,   L 2, 5-6 rib fx   Small extrapleural hematoma    Currently Active/Followed Hospital Problems:  Active Hospital Problems    Diagnosis     *!*Fall from 15 stairs 6/15 type III odontoid fx, scalp lac, ?lumbar vein extrav, periaortic fat stranding, splenic lac, ?pancreatic injury, L 2, 5-6 rib fx, small extrapleural hematoma        PLAN:    Pulm: h/o asthma  - On  4L, wean O2 as needed  - Continue home albuterol, pulmicort, duonebs  - Incentive spirometer    No results for input(s): APH, APCO2, APO2, AHCO3, ABE in the last 168 hours.     CVS: h/o CAD s/p CABG and stent; concern for aortic injury, hypotension - improved, >70% bilateral ICA stenosis  - Vascular signed off   - Hold home atorvastatin and clopidogrel  - Continue L fem a-line  - Hydralazine prn for SBP > 160  - Echo: normal wall motion, EF 70%  - Consider Carotid US for finding of >70% bilateral ICA stenosis      BP  Min: 85/42  Max: 155/68  Pulse  Avg: 73.4  Min: 67  Max: 83     Renal/E/F: Normal renal function   - mIVF Plasmalyte 100 mL/hr  - foley yes   - lytes repleted: none     - 24hr UOP: 1.8 L  - net IO: +491 mL    Recent Labs   Lab 10/26/19  2319 10/26/19  0341   UN 12 16   Creatinine 0.66 0.77   Potassium 4.5 4.4       GI/Nutrition:    - diet: CLD  - bowel reg: yes   - last BM PTA  - PUD ppx: yes, Protonix (home medication)    ID: No current infectious concern   - trend WBC and fever curve   - Indication for antimicrobial therapy ?  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 Is Hct <21? no  last transfused: 6/15  Hct 36 (33)    Recent Labs   Lab 10/26/19  2319 10/26/19  0818 10/26/19  0341   Hematocrit 36   < > 33*   Platelets 220  --  210    < > = values in this interval not displayed.         Endo: No active issue   No results for input(s): PGLU in the last 168 hours.    Neuro/pain:   - Pain: oxycodone 5/10 prn, dilaudid 0.5 q2hr prn  - APS following, peripheral catheter in place  - Hold home gabapentin 300 mg TID and sertraline 100 mg daily    Integument: scalp lac s/p washout and stapled closed in ED  - PRS consulted    Musculoskeletal: bedrest, right hand and left knee pain, type III odontoid fx  - Ortho consulted: f/up R hand and L knee XR  - NSGY consulted for type III odontoid fx: C-collar in place, MRI c-spine w/o contrast    LDA: left radial a-line, R femoral CVC, foley  Ppx: protonix  Consults: NSGY, Urology, Vascular    Author: Maudry Mayhew, MD  as of: 10/27/2019  at: 4:54 AM

## 2019-10-26 NOTE — H&P (Signed)
[facility] Critical Care H&P/Consult    HPI: [name] is a 74 yo F with pmh of CAD s/p CABG 2012 in [place] and stenting angio 2015 on Plavix, asthma, and OSA who presents as level II trauma following fall down 10-12 steps. In the ED, vitals were HR 66, BP 140/68 with GCS 15. Notable injuries included right scalp hematoma, L parietal scalp laceration, type III odontoid fracture, left 2 and 5-6 rib fxs, small splenic laceration, and periaortic fat stranding. She is admitted to the [facility] for close monitoring and serial hct. She endorses left sided chest pain. She denies shortness of breath, fever, chills, nausea, emesis, or abdominal pain.    Patient Active Problem List    Diagnosis Date Noted    Pulmonary nodule 09/30/2014     Priority: High     CT 02/20/15:  Stable left lower lung 6 mm nodule.     Repeat 12/18 stable, no furhter imaging indicated      CAD (coronary artery disease) 09/30/2014     Priority: Medium     Coronary artery disease   CABG 2013.    PCI to LAD in 8/13.    Coronary angiogram February of 2015 at [facility] after she had chest pain and an abnormal stress test that revealed patent stent and 3/3 grafts patent. The ejection fraction was 60%.    Episode of atypical chest pain 04/2015, negative troponins, negative nuclear stress test.         Prediabetes 09/30/2014     Priority: Medium    Obesity 09/30/2014     Priority: Medium    Severe Obstructive sleep apnea 11/11/2013     Priority: Medium     Adherent with CPAP      Gastritis 09/30/2014     Priority: Low    Fall from 15 stairs 6/15 type III odontoid fx, scalp lac, ?lumbar vein extrav, periaortic fat stranding, splenic lac, ?pancreatic injury, L 2, 5-6 rib fx, small extrapleural hematoma 10/25/2019    Environmental allergies 07/24/2018     Immunotherapy ~2010      Osteopenia 04/03/2018     DXA 10/2016 AP spine -0.2, L neck 1.1, R -1.3, total L -1.0, total R -0.9  05/2018 AP spine -0.2, L neck -2.1, total -1.3      COPD  (chronic obstructive pulmonary disease) 03/30/2018     PFTs 2/19: FEV1/FVC 0.65, FEV1 74%  3/21:Fev1/fvc 0.65, FEV1 64%   FEV1 has decreased by 16% and FVC has decreased by 10% . Transitioned to trelegy      Anxiety and depression 06/12/2017    Aortic stenosis 04/14/2017     Mild on echo 3/21, repeat in 34yr     Health care maintenance 01/01/2017     PCN allergy removed after negative allergy testing.       Restless leg syndrome 09/20/2016    Orbital floor fracture 01/04/2016    Facial fracture due to fall 12/28/2015    Dysthymia 09/14/2015     Past Medical History:   Diagnosis Date    Allergic rhinitis 09/30/2014    Arthritis     Asthma     CAD (coronary artery disease)     DVT (deep venous thrombosis)     post-op after CABG, on anticoagulation for ~2 years    PE (pulmonary embolism)     post-op after CABG, on anticoagulation for ~2 years    Prediabetes     Tubular adenoma 03/2015    colonoscopy  Past Surgical History:   Procedure Laterality Date    ACHILLES TENDON SURGERY      APPENDECTOMY      CHOLECYSTECTOMY      CORONARY ANGIOPLASTY WITH STENT PLACEMENT      CORONARY ARTERY BYPASS GRAFT  2013    triple     CYST REMOVAL      both thumbs    PR REPAIR EYE BLOWOUT,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    Procedure: ORBIT ORIF;  Surgeon: Jannett Celestine, DDS;  Location: [facility];  Service: OMFS    TONSILLECTOMY AND ADENOIDECTOMY        Medications Prior to Admission   Medication Sig    atorvastatin (LIPITOR) 40 MG tablet TAKE ONE TABLET BY MOUTH ONCE DAILY    budesonide (PULMICORT) 0.25 MG/2ML nebulizer solution Inhale 0.25 mg into the lungs daily    budesonide-formoterol (SYMBICORT) 160-4.5 MCG/ACT inhaler Inhale 2 puffs into the lungs 2 times daily    albuterol HFA (PROVENTIL, VENTOLIN, PROAIR HFA) 108 (90 Base) MCG/ACT inhaler Inhale 2 puffs into the lungs every 6 hours as needed    montelukast (SINGULAIR) 10 MG tablet Take 1 tablet (10 mg total) by mouth nightly    sertraline  (ZOLOFT) 100 MG tablet Take 1 tablet (100 mg total) by mouth daily Replacing 9m dose, please cancel other refills    fluticasone-umeclidinium-vilanterol (TRELEGY ELLIPTA) 200-62.5-25 MCG/INH diskus inhaler Inhale 1 puff into the lungs daily    albuterol HFA (PROVENTIL, VENTOLIN, PROAIR HFA) 108 (90 Base) MCG/ACT inhaler Inhale 2 puffs into the lungs every 4-6 hours as needed    gabapentin (GABAPENTIN) 300 MG capsule Take 1 capsule (300 mg total) by mouth 3 times daily    pantoprazole (PROTONIX) 20 MG EC tablet Take 1 tablet (20 mg total) by mouth daily 30 min before breakfast Swallow whole. Do not crush, break, or chew.    azelastine (ASTELIN) 0.1 % nasal spray 1-2 sprays by Nasal route 2 times daily    Spacer/Aero-Holding Chambers (EASIVENT) spacer Use twice daily with Symbicort inhaler. Breathe in slowly.    fluticasone (FLONASE) 50 MCG/ACT nasal spray 2 sprays by Nasal route daily    Non-System Medication Medication/Supply: R thumb spica brace  Directions for Use: wear nightly    ipratropium-albuterol (DUONEB) 0.5-2.535m/18m22mebulizer solution Take 3 mLs by nebulization every 6 hours    ipratropium (ATROVENT) 0.06 % nasal spray 2 sprays by Each Nare route 3 times daily    Misc. Devices (CPAP) machine Download and compliance report    atorvastatin (LIPITOR) 40 MG tablet Take 40 mg by mouth daily (with dinner)    Cholecalciferol (VITAMIN D3) 5000 UNITS TABS Take 5,000 Units by mouth every morning       docusate sodium (COLACE) 100 MG capsule Take 200 mg by mouth every morning       clopidogrel (PLAVIX) 75 MG tablet Take 75 mg by mouth every morning        Allergies   Allergen Reactions    Morphine Hives     Has tolerated Percocet      Social History     Tobacco Use    Smoking status: Never Smoker    Smokeless tobacco: Never Used   Substance Use Topics    Alcohol use: No      Family History   Problem Relation Age of Onset    Other Mother         "rare lung disease"    Aneurysm Mother  abdominal aortic aneurysm    Diabetes Type II Mother     Breast cancer Mother     Blood clots Mother     Stroke Father     Stroke Brother         x2    Thyroid cancer Daughter         Review of Systems:  Pertinent items are noted in HPI.    Scheduled Meds:   budesonide-formoterol  2 puff Inhalation Q12H    magnesium sulfate  2,000 mg Intravenous Once    hydrALAZINE  10 mg Intravenous Once    hydrALAZINE        ipratropium-albuterol  3 mL Nebulization Q6H    pantoprazole  40 mg Intravenous Q24H    APAP  1,000 mg Intravenous Q8H    lidocaine-EPINEPHrine  20 mL Other Once     Continuous Infusions:   sodium chloride 3 mL/hr (10/26/19 0609)    electrolyte       PRN Meds:ondansetron, May use line (invasive line) **AND** sodium chloride **AND** sodium chloride, albuterol HFA, HYDROmorphone hcl, sodium chloride, dextrose    Objective:    Vital signs in last 24 hours:  Temp:  [35.8 C (96.4 F)-37.5 C (99.5 F)] 37.3 C (99.1 F)  Heart Rate:  [57-88] 71  Resp:  [13-24] 16  BP: (64-169)/(33-85) 127/59  Arterial Line BP: (146-155)/(65-70) 146/65    Intake/Output last 3 shifts:  No intake/output data recorded.  Intake/Output this shift:  I/O this shift:  06/14 2300 - 06/15 0659  In: 0 (0 mL/kg)   Out: 1150 (10 mL/kg) [Urine:1150]  Net: -1150  Weight: 115.1 kg     Vent settings for last 24 hours:       Hemodynamic parameters for last 24 hours:       Physical Exam:  Gen: awake and alert, no acute distress  HEENT: c-collar in place, right forehead hematoma with periorbital swelling, left parietal laceration  Resp: CTAB  CV: RRR, 2+ pulses in bilateral radial and DP  Abd: soft, NDNT  Extremities: MAE  Neuro: oriented x 3, follows commands no focal deficits    Labs:   Heme:  Recent Labs   Lab 10/26/19  0341 10/26/19  0042 10/26/19  0042 10/25/19  2048 10/25/19  2048   WBC 15.0*  --  19.5*  --  18.6*   Hemoglobin 10.2*   < > 12.8   < > 12.3   Hematocrit 33*  --  43  --  41   Platelets 210  --  249  --  281   Protime   --   --   --   --  11.6   INR  --   --   --   --  1.0   aPTT  --   --   --   --  22.4*    < > = values in this interval not displayed.   Chem:  Recent Labs   Lab 10/26/19  0341 10/26/19  0042 10/26/19  0042 10/25/19  2048   Sodium 142  --  137  --    Potassium 4.4   < > CANCELED  --    Chloride 107  --  103  --    CO2 26  --  18*  --    UN 16  --  19  --    Creatinine 0.77  --  0.84 0.98*   Glucose 144*  --  142*  --  Calcium 8.3*  --  9.2  --    Magnesium 1.8  --  2.1  --    Phosphorus 3.6  --  4.3  --     < > = values in this interval not displayed.   No results for input(s): PGLU in the last 168 hours.LFT's:  Recent Labs   Lab 10/25/19  2048   Bilirubin,Total 0.2   Bilirubin,Direct <0.2   AST 57*   ALT 39*   Alk Phos 157*   Total Protein 6.5   Albumin 3.8   Amylase 50   Lipase 59   ABG:  Recent Labs   Lab 10/25/19  2048   Lactate 1.4          Imaging:   CT angio chest    Addendum Date: 10/26/2019    -------- ADDENDUM #2 -------- Findings discussed by phone with Dr. Melene Muller from vascular surgery 10/26/2019 at approximately 6:25 AM. Follow-up is deferred to the vascular surgery service. [facility] submits this DICOM format image data and final report to the [facility], an independent secure electronic health information exchange, on a reciprocally searchable basis (with patient authorization) for a minimum of 12 months after exam date.-------- ADDENDUM #1 -------- While overall there is less periaortic hematoma, much of the decrease is seen more inferiorly; this hematoma appears slightly increased adjacent to some atelectatic lung on 501-243 which may be due to redistribution. No contrast extravasation. Vascular surgery consultation would be helpful. Would recommend reimaging with CTA at 48 hours from this exam, followed by CTA in one month if findings are stable or improving. Discussed by phone with Glorianne Manchester 6:10 AM 10/26/2019. [facility] submits this DICOM format image data and final report to the  [facility], an independent secure electronic health information exchange, on a reciprocally searchable basis (with patient authorization) for a minimum of 12 months after exam date.    Addendum Date: 10/26/2019    -------- ADDENDUM #1 -------- While overall there is less periaortic hematoma, much of the decrease is seen more inferiorly; this hematoma appears slightly increased adjacent to some atelectatic lung on 501-243 which may be due to redistribution. No contrast extravasation. Vascular surgery consultation would be helpful. Would recommend reimaging with CTA at 48 hours from this exam, followed by CTA in one month if findings are stable or improving. Discussed by phone with Glorianne Manchester 6:10 AM 10/26/2019. [facility] submits this DICOM format image data and final report to the [facility], an independent secure electronic health information exchange, on a reciprocally searchable basis (with patient authorization) for a minimum of 12 months after exam date.    Result Date: 10/26/2019  There is a 5 mm linear filling defect in the aortic arch posterior medially just distal to the left subclavian artery origin without contour deformity or surrounding hematoma, compatible with intimal injury. This is categorized as minimal aortic injury which is amenable to nonoperative or medical management. Generally, suggested follow-up CTA is at 48 to 72 hours, then repeat CTA at 1 month. This finding has not changed significantly in retrospect compared to 10/25/2019 at 9:02 PM. Note that these injuries are associated with periaortic hematomas. Interval slight decrease in size of the periaortic hematoma involving the mid to lower thoracic and upper abdominal aorta. Aorta at these levels appears normal. No contrast extravasation. Left-sided rib fractures with small extra pleural hematoma. Small focal areas of groundglass opacities in the left lung apex compatible with pulmonary contusion, not significantly changed. END  OF  IMPRESSION I have personally reviewed the images and the Resident's/Fellow's interpretation and agree with or edited the findings. [facility] submits this DICOM format image data and final report to the [facility], an independent secure electronic health information exchange, on a reciprocally searchable basis (with patient authorization) for a minimum of 12 months after exam date.    CT angio neck carotid    Result Date: 10/25/2019  No evidence of acute carotid or vertebral artery dissection in the neck. Severe left and moderate-to-severe right internal carotid narrowing just distal to the bifurcation likely due to chronic atherosclerotic changes with greater than 70% stenosis bilaterally. END OF IMPRESSION I have personally reviewed the images and the Resident's/Fellow's interpretation and agree with or edited the findings. [facility] submits this DICOM format image data and final report to the [facility], an independent secure electronic health information exchange, on a reciprocally searchable basis (with patient authorization) for a minimum of 12 months after exam date.    CT abdomen and pelvis with IV contrast    Result Date: 10/25/2019  There is fat stranding and possible extravasation from the left lumbar vein and thickening of the medial crus of the left hemidiaphragm. Adjacent to this site, there is a moderate degree of fat stranding around the aorta. This stranding is likely an extension of the injury to the left lumbar vein with venous bleeding. However, an aortic injury cannot be entirely ruled out. Small splenic laceration and perisplenic hematoma, consistent with a low grade splenic injury. 3 cm hematoma adjacent to the pancreatic tail may represent a low grade pancreatic tail injury. Mild endometrial thickening/low-attenuation material in the endometrial canal, more than expected for age. Recommend further evaluation with pelvic ultrasound on a nonemergent basis. Significant findings were  conveyed to Dr. Marijean Bravo via phone by Dr. Oleta Mouse at 10:03 PM on 10/25/2019. END OF IMPRESSION I have personally reviewed the images and the Resident's/Fellow's interpretation and agree with or edited the findings. [facility] submits this DICOM format image data and final report to the [facility], an independent secure electronic health information exchange, on a reciprocally searchable basis (with patient authorization) for a minimum of 12 months after exam date.    CT chest with IV contrast    Result Date: 10/25/2019  Mildly displaced left posterior second rib fracture with small adjacent extrapleural hematoma. There are additional nondisplaced fractures of the posterior left fifth and sixth ribs. Few small areas of groundglass opacities within the left lung apex, likely representing pulmonary contusion/hemorrhage. There is soft tissue stranding around the mid descending thoracic aorta with extension to the level of the abdominal aorta. This may represent small hematoma formation. No active extravasation is visualized. This can be from a venous injury or adjacent focal diaphragmatic injury. If clinically indicated, a repeat CT can be obtained in 12-24 hours with delayed phase images to evaluate for any persistent venous or occult aortic injury. No contour abnormality in thoracic aorta, no intimal flap or intramural thrombus. No aortic pseudoaneurysm formation. Please see separately dictated CT abdomen and pelvis report for findings below the diaphragm and CT thoracic spine report for additional details. END OF IMPRESSION    CT maxillofacial without contrast    Result Date: 10/25/2019  No acute fracture of the maxillofacial skeleton. Partially visualized type III dens fracture. Please refer to separately dictated CT cervical spine for further description.. END OF IMPRESSION [facility] submits this DICOM format image data and final report to the [facility],  an independent secure electronic health information  exchange, on a reciprocally searchable basis (with patient authorization) for a minimum of 12 months after exam date.    CT head without contrast for TRAUMA    Result Date: 10/25/2019  No acute intracranial abnormality. No acute intracranial hemorrhage. No displaced calvarial fracture. Partly visualized likely type III odontoid fracture. Please see separately dictated CT cervical spine for further evaluation. Large approximate 9 cm hematoma at the right frontal scalp and a large laceration to the left parietal scalp as described above. Please see additional separately dictated CT reports for further evaluation Findings discussed with covering provider Dr. Ernie Hew via phone at 9:20 PM on 10/25/2019. END OF IMPRESSION I have personally reviewed the images and the Resident's/Fellow's interpretation and agree with or edited the findings. [facility] submits this DICOM format image data and final report to the [facility], an independent secure electronic health information exchange, on a reciprocally searchable basis (with patient authorization) for a minimum of 12 months after exam date.    Chest single frontal view    Result Date: 10/25/2019  Mildly displaced left upper rib fractures, involving at least the second rib. Small adjacent extrapleural hematoma Hypoexpanded lungs. END OF IMPRESSION I have personally reviewed the images and the Resident's/Fellow's interpretation and agree with or edited the findings. [facility] submits this DICOM format image data and final report to the [facility], an independent secure electronic health information exchange, on a reciprocally searchable basis (with patient authorization) for a minimum of 12 months after exam date.    CT lumbar spine reprocess from CT    Result Date: 10/25/2019  No acute lumbosacral fracture. Please see the concurrent CT abdomen and pelvis report for a complete discussion of intra-abdominal findings. END OF IMPRESSION I have personally reviewed the images  and the Resident's/Fellow's interpretation and agree with or edited the findings. [facility] submits this DICOM format image data and final report to the [facility], an independent secure electronic health information exchange, on a reciprocally searchable basis (with patient authorization) for a minimum of 12 months after exam date.    CT thoracic spine reprocess from CT    Result Date: 10/25/2019  Motion artifact degrades image quality and limits sensitivity of the examination. No acute thoracic spine fracture. END OF IMPRESSION [facility] submits this DICOM format image data and final report to the [facility], an independent secure electronic health information exchange, on a reciprocally searchable basis (with patient authorization) for a minimum of 12 months after exam date.    CT angio abdomen and pelvis    Result Date: 10/26/2019  Stable fat stranding/hematoma about the upper abdominal aorta. There is increased hematoma in the left retroperitoneum and abutting the psoas muscle, but this does not appear to be associated with the abdominal aorta and may be associated with venous bleed. Discussed with Dr. Virl Cagey from vascular surgery by phone 6:25 AM 10/26/2019. There is no evidence of extravasation of contrast from the abdominal aorta . No direct injury seen to the abdominal aorta. There is some wall thickening of the proximal to mid ureter without contrast extravasation, suspicious for possible mural injury to the ureter. There is mild associated left hydronephrosis. Consider short interval follow-up ultrasound rule out increasing  hydronephrosis. Stable 3 cm hematoma adjacent pancreatic tail. Follow-up of lipase/amylase would be helpful to rule out any posttraumatic pancreatitis involving the pancreatic tail. Small hematoma along left lower abdominal wall laterally. Small amount of blood now in the  left anterior pelvis just deep to the ventral abdominal wall. No active bleeding visualized. END OF  IMPRESSION I have personally reviewed the images and the Resident's/Fellow's interpretation and agree with or edited the findings. [facility] submits this DICOM format image data and final report to the [facility], an independent secure electronic health information exchange, on a reciprocally searchable basis (with patient authorization) for a minimum of 12 months after exam date.    CT Cervical Spine Reprocess from CT    Result Date: 10/25/2019  Acute mildly displaced type III odontoid fracture without narrowing of the central spinal canal. Please see additional separately dictated CT reports. Findings discussed with covering provider Dr. Ernie Hew via phone at 9:20 PM on 10/25/2019. END OF IMPRESSION I have personally reviewed the images and the Resident's/Fellow's interpretation and agree with or edited the findings. [facility] submits this DICOM format image data and final report to the [facility], an independent secure electronic health information exchange, on a reciprocally searchable basis (with patient authorization) for a minimum of 12 months after exam date.      Assessment/Plan:  Admit to: [facility], Dr. Maura Crandall    Principal Problem:    Fall from 15 stairs 6/15 type III odontoid fx, scalp lac, ?lumbar vein extrav, periaortic fat stranding, splenic lac, ?pancreatic injury, L 2, 5-6 rib fx, small extrapleural hematoma      Pulmonary: h/o asthma  - On 2L, wean O2 as needed  - Continue home albuterol, fluticasone-umeclidin-vilant (trelegy ellipta), motelukast, tiotropium bromide monohydrate    Cardiovascular: h/o CAD s/p CABG and stent; concern for aortic injury, hypotension, >70% bilateral ICA stenosis  - Vascular consulted: stat repeat CTA chest/abdomen/pelvis for hypotension and lethargy. Q1 doppler pulse and CMS checks   CTA chest: 5 mm linear filling defect in the aortic arch posterior medially just distal to the left subclavian artery origin without contour deformity or surrounding hematoma, compatible with  intimal injury     CTA abdomen/pelvis: Stable to slightly decreased region of inflammatory fat stranding surrounding the descending thoracic aorta with proximal abdominal aorta. This may represent a small posttraumatic hematoma from venous injury from a lumbar vein or adjacent focal diaphragmatic injury. There is no evidence of extravasation of contrast from the aorta . No contour abnormality of the abdominal aorta, intimal flap or intramural thrombus. Stable small splenic laceration perisplenic hematoma. Stable 3 cm hematoma adjacent pancreatic tail, which may represents a low-grade pancreatic tail.     - Repeat CTA chest in 48 hours; Vascular and trauma aware  - Hold home atorvastatin and clopidogrel  - L femoral a-line, ~20-30 mmHg discrepancy between cuff and a-line (a-line running higher)  - Hydralazine prn for SBP > 160  - Echo ordered  - Consider Carotid US for finding of >70% bilateral ICA stenosis      Troponin I       Lab results: 10/26/19  0514   TROP T 3 HR High Sensitivity 25*       Renal/E/F: Normal renal function  - Foley  - Monitor UOP  - BMP, Mg, Phos daily  - UOP: 1.2 L/24 hours    GI:   - NPO, IVF  - PUD ppx: Protonix (home medication)    Endo: No active issue  - Monitor BG        Lab results: 10/26/19  0341 10/26/19  0042 08/11/19  1557   Glucose 144* 142* 111*       ID: No current infectious concern  Trend  WBC and fever curve   COVID neg 6/14  Ancef and Tdap given in [facility]        Lab results: 10/26/19  0341 10/26/19  0042 10/25/19  2048 08/11/19  1557   WBC 15.0* 19.5* 18.6* 6.8       Heme: 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 )    TEG; R time 3.2, K time 0.8, A angle 77.3, MA 70.7, LY30 1.5, Coag index 4.4        Lab results: 10/26/19  0341 10/26/19  0042 10/25/19  2048 08/11/19  1557   Hematocrit 33* 43 41 40       Neuro/Sedation:   - Pain: IV tylenol 1000 mg q8 hr, dilaudid 0.25 mg q2hr prn  - Hold home gabapentin 300 mg TID and sertraline 100 mg daily    Derm: scalp lac s/p washout and stapled closed in ED  - PRS consulted    MSK: bedrest, right hand and left knee pain, type III odontoid fx  - Ortho consulted: f/up R hand and L knee XR  - NSGY consulted for type III odontoid fx: C-collar in place, MRI c-spine w/o contrast    CODE STATUS: Full    Signed by: ElaaMaudry Mayhew as of 10/26/2019 at 6:59 AM

## 2019-10-26 NOTE — Progress Notes (Addendum)
Trauma & Acute Care Surgery Progress Note     LOS: 1 day     Subjective:    Admitted to the [facility]  Seen by spine, vascular  Hypotensive overnight, Stat CTA CAP ordered  Objective:    Vitals Sign Ranges for Past 24 Hours:  BP: (101-169)/(50-85)   Temp:  [35.8 C (96.4 F)-37.5 C (99.5 F)]   Temp src: Axillary (06/14 2346)  Heart Rate:  [57-88]   Resp:  [13-24]   SpO2:  [86 %-100 %]   Height:  [177.8 cm (5\' 10" )]   Weight:  [120.2 kg (265 lb)]       Physical Exam:    General Appearance: NAD  HEENT: C-Collar in place  Cardiac: RRR  Respiratory: nonlabored  Abdomen: obese, soft, nt  Extremities: WWP    Labs:    CBC:  Recent Labs   Lab 10/26/19  0042 10/25/19  2048 10/25/19  2048   WBC 19.5*  --  18.6*   Hemoglobin 12.8   < > 12.3   Hematocrit 43  --  41   Platelets 249  --  281    < > = values in this interval not displayed.       Metabolic Panel:  Recent Labs   Lab 10/25/19  2048   Creatinine 0.98*          Assessment:    74 y.o. female L2T fall from 15 stairs 6/15    Type III odontoid fx  Scalp lac   Periaortic fat stranding   ?lumbar vein extrav  Splenic lac,   ?Pancreatic injury,   L 2, 5-6 rib fx   Small extrapleural hematoma      Plan:    - NPO,IVF  - Q6H H/H  - Will follow-up CTA; Discussed w/ radiology, will obtain venous phase CT following the angiogram  - Serial abdominal exams  - F/U Spine recs  - F/u Vascular recs  - SCDs  - Appreciate BTICU care      Neta Ehlers, MD  Surgery Resident

## 2019-10-26 NOTE — Progress Notes (Signed)
Report Given To  J. Alexander, RN    Pt arrived to [facility] from [place] s/p mechanical fall down 12-15 stairs, on home Plavix. Pt arrived on unit normotensive, A&O x 3. ~0200 pt became hypotensive 60/30s with AMS and increased lethargy. Provider at bedside. Given 2 x 1 L LR boluses with moderate effect on BP (brought up to 80/40s). 1 unit PRBC and 1 unit platelets given. Patient was ordered STAT CT of head/chest/abdomen, however, scan placed on hold until additional access could be obtained. Triple lumen CVC placed in R fem. Pt then taken down for STAT CT scans. Per vascular, no imminent need for OR at this time. Left femoral a-line place for hemodynamic monitoring. EKG and troponins sent; 3-hour troponin to be drawn at 0815. Bladder scanned for 540 cc, foley placed. COVID sent, negative. Q1 CMS/doppler unchanged; pt with +1 pedal/PT pulses in BLE. ~0530 pt became hypertensive with SBP 170-180s; provider notified. Pt given 10 mg Hydralazine infused over 30 minutes with therapeutic effect; SBP now 150s. Prn dilaudid given x 2 for 8/10 pain; pt became nauseous immediately after requiring prn zofran x 2 with positive effect. Hct 31 (30, 30), Hgb 10.2 (12.8, 12.3), WBC 15.0 (19.). Daughter updated by team. Please refer to MAR/flowsheets for further information regarding patient care.    Neita Goodnight, RN          Descriptive Sentence / Reason for Admission   [name] is a 74 y.o. female w/pmhx CAD s/p CABG 2012 in [place] and recent stenting angio 2015 on plavix, COPD, OSA, DM who presented as level II Trauma after fall down 12 stairs at nephew's house during family party.     Injuries:     odontoid fracture  splenic lac  rib fractures  possible pancreatic injury  concern for periaortic fat stranding possible aortic injury.     Vascular surgery consulted for assessment and management.       Active Issues / Relevant Events   615N: Admit to [facility]. Hypotensive 60/30s with AMS; BP then 80/40s after 2 x 1 L  LR fluid boluses given. Given 1 unit PRBC and 1 unit platelets. EKG with trops sent.  R fem triple lumen CVC placed and L fem a-line placed. STAT CTA head/chest/abdomen completed. COVID NEG.      To Do List  Q1 VS  Q1 doppler/CMS  Q1 I&O  Q4 Neuros    Q6 H/H  Daily labs   COVID NEG    MRI c-spine without contrast and Echo to be completed 6/15      Anticipatory Guidance / Discharge Planning  Transfer to floor when medically stable.

## 2019-10-26 NOTE — Progress Notes (Signed)
Brief Vascular Surgery Progress Note    Reviewed CT angio. No concern for acute aortic injury.     Ardelle Balls, MD  Vascular Surgery

## 2019-10-26 NOTE — Plan of Care (Signed)
Problem: Safety  Goal: Patient will remain free of falls  Outcome: Maintaining  Goal: Prevent any intentional injury  Outcome: Maintaining     Problem: Pain/Comfort  Goal: Patient's pain or discomfort is manageable  Outcome: Maintaining     Problem: Nutrition  Goal: Nutritional status is maintained or improved - Geriatric  Outcome: Maintaining     Problem: Mobility  Goal: Functional status is maintained or improved - Geriatric  Outcome: Maintaining     Problem: Psychosocial  Goal: Demonstrates ability to cope with illness  Outcome: Maintaining     Problem: Cognitive function  Goal: Cognitive function will be maintained or return to baseline  Outcome: Maintaining     Problem: Potential Alteration in Skin Integrity - Pressure Ulcer  Goal: The patient will maintain intact skin free of pressure ulcer  Outcome: Maintaining

## 2019-10-26 NOTE — Procedures (Signed)
Procedure Report      ARTERIAL LINE PROCEDURE NOTE    Time out documentation completed in Procedure navigator prior to procedure:  Yes    Indications  Hemodynamic instablility    Procedure Details  Successful placement: Yes  Side: left  Arterial Site: femoral artery  Attempted Sites: Left femoral artery      1% lidocaine S.Q. 3 mL total  Artery cannulated with 20G catheter  Technique: with wire guide and ultrasound  Correct placement confirmed by: transducer, pulsatile flow  Secured by: suturing and bioocclusive dressing  Guide Wire Removed : yes      Complications  none    Condition  Patient tolerated procedure well. Yes  Patients condition at end of procedure: no change from prior      EBL  3 mL    Disposition  BTICU    Maudry Mayhew, MD  10/26/2019  6:14 AM

## 2019-10-26 NOTE — Progress Notes (Signed)
In-patient Visit  [facility]  [phone]    Patient name: [name]     There were no vitals filed for this visit.    The Patient was provided with the following items:  Device  Side: N/A  Size:: Stout  Model:: [place] J   Manufacturer: [employer]  Part Number: MJR [id number]  Warranty:: 90 Days  Quantity: 1  Status: Delivered    Functional Goals: Spine precautions    ROM settings: N/A    Expected Frequency / Duration of Use:   Daily    Modifications made: No modifications were required at this time    Complexity:   Fitting was routine, requiring little to no adjustments    Yes, Device was inspected for safety/security and found to be functioning properly    Ordered/Delivered: Device Delivered    The patient and caregiver given verbal and written instructions.  The following instructions were reviewed: Purpose of device  Cleaning / Care of device  Potential Risks / Benefits  Frequency / Duration of use  How to report potential failure / malfunctions    Reviewed details of this delivery with [name] 's nurse.    Patient Instructions for [facility] J Cervical Collar    Purpose of device   You have been provided with a [place] J cervical collar (c-collar) that your physician has prescribed for wear.  The purpose of your c-collar is to provide cervical immobilization following injury or fracture.      Wear Instructions   Your Cervical Collar is a 2 part brace (front and back).    The back of the collar fully laps over the front of the brace.     C-collar is appropriate for use directly against the skin.     Place the front of the collar under your chin and attach the Velcro strap from one side of the back of the brace to the side of the front portion of the brace.   Close the brace snug around the neck with the opposite Velcro strap.     Make adjustments to your straps so that your collar is centered on your neck.   It is important to wear your collar as snug as tolerable.   Wearing c-collar too loose with reduce effectiveness.     If blue pads become soiled they can be hand washed in mild detergent and air dried.   Replacement pads are available if pads become excessively soil or ruined.  Contact your Orthotist if you need to obtain a set of replacement pads.    Rigid frame of collar can be hand-wash the brace with mild soap and water.   Velcro straps can be cleaned with a bristle brush.     Cleaning and Care   Keeping your [place] J and the skin beneath clean is an important part of your treatment.    Daily cleaning will help to prevent skin irritation.    When you are at home you should shower or bathe with the collar on.    Change pads while lying down: Lie flat in bed without a pillow. Keep your head in a neutral position.    Peel the soiled, blue pads off. Look carefully at the shape as you remove them so that you can reposition the clean pads properly. The pads attach with velcro.    Wash the pads with mild facial soap and water. DO NOT use bleach or harsh detergents.  Thoroughly rinse the pads with clean water. Wring out the excess water and squeeze in a towel. Lay the pads out flat to air dry. It should take less than 60 minutes for them to dry.    Wipe the white plastic collar shell clean with mild soap and water.    Frequency / Duration of use    Overall benefit of brace wear will depend on compliance.  It will be important to follow-up your physician to determine whether optimal outcomes have been achieved.     Your physician will determine overall length of time you will need to wear your brace.      Potential Risks / Benefits    You must be lying flat to remove the collar unless your doctor gives you permission to do this in a sitting position.    Unless otherwise specified by your doctor: Do not remove the collar except to wash under it and change the pads.    How to report potential failure/malfunction   If your brace begins to no longer function as  originally fit or intended please [facility] at [phone].

## 2019-10-26 NOTE — Progress Notes (Signed)
[facility] / Trauma ICU Attending Plan:    I have seen and evaluated the patient personally on rounds with the resident/Fellow/PA/NP ICU team. I personally examined the patient.  All nursing documentation, laboratory data, test results, and radiographs were reviewed and interpreted by me.I agree with the database, findings, and plan of care recorded in the above note. I have reviewed and edited the above note to reflect my findings as well.  I have established the management plan for this patient's critical illness with the ICU team and have been immediately available to assist with patient care. Additional details are highlighted are within my note below.     Catalogue of Injuries    - right scalp hematoma  - left parietal scalp laceration  - type 3 odontoid fx  - left rib fracture  - splenic laceration    Respiratory: minimal supplemental oxygen needs. Asthma medication resume: albuterol, pulmicort, duonebs. Pulmonary supportive care with IS    Acute Pulmonary Insufficiency -- Yes   Acute Respiratory Failure: No  Ventilator dependent: No    CV: hemodynamically labile overnight - response to transfusion needs [facility] + 1pack platelets). Adequate perfusion     Shock: No Dysrrythmia/Arrhythmia: No    GU/Fluids: Plasmalyte @ 100cc/hr. Adequate urine output > 1L. Stable BUN/Cr. hypoMg replacement. Continue foley catheter for urine output measurement.   Acute renal injury: No Chronic renal failure: No Electrolyte abnormality: No Need to keep foley? yes      GI/Nutrition: NPO.  Resume PPI home regimen.   On GI prophylaxis: Yes, Moderate malnutrition: No     ID: -fever/leukocytosis (15). Ancef + TDAP for laceration management. Right TLC (6/15), Left groin arterial line (6/15)  SIRS: Yes Sepsis: No Severe Sepsis: No  Pneumonia: No  COVID Positive: No    Last COVID test date: 6/14    Heme: acute blood loss anemia serial hct q6h; discontinue Plavix, platelet function TEG - normal. SCD's for mechanical prophylaxis  On DVT  Prophylaxis: No: Acute blood loss anemia: No Coagulopathy: No     Endocrine: no current issues.    Hyperglycemia -- No  Insulin regimen -- No  Synthroid -- No    Neuro/Sedation: poor pain management: consider po Tylenol + po oxycodone, consider catheter based analgesia for pain management. Neurocognitive medication regimen: restart home Sertraline. Hold neuropathic home regimen.   Encephalopathy: No Coma: NoBrain compression: No    Exts/Consults/Other: ICU prophylaxis regimen  - APS evaluation and management  - neurospine evaluation and management.   - trauma sx evaluation and management. Tertiary evaluation pending  - PT/OT evaluation for dispo recommendations     Social/Family/Dispo: family update  Family present at bedside today?  No    Attending Attestation:    This is a high complexity patient.     [X]  This patient is critically ill with at least 1 organ system failure associated with a high probability of life threatening deterioration. The care I have delivered involved high complexity decision making to assess, manipulate, and support vital system function(s), to treat the vital organ system failure and/or prevent further deterioration of the patient's condition. All nursing documentation, laboratory data, test results, and radiographs were reviewed and interpreted by me during multidisciplinary rounds. I have established the management plan for this patient's critical illness and have been immediately available to assist with patient care. My documented total critical care time reflects my own patient care time and does not include teaching or procedure time.     Admission diagnoses for this  admission include:  Acute Blood Loss Anemia, Acute Pulmonary Insufficiency Following Surgery and Electrolyte Imbalance hypoMagnesium    Critical care time since midnight/this 24 hour period: Personal time spent - 35 minutes.  This critical care time is exclusive of any time for separately billable procedures and time  spent teaching    Signed by: Melton Krebs, MD  as of 10/26/2019 at 8:36 AM

## 2019-10-26 NOTE — Progress Notes (Signed)
Brief Vascular Surgery Progress Note    Contacted by [place] team for hypotension, now responsive to IVF and normotensive.     Assessed patient at the bedside. Denies abdominal pain. Admits to continued back pain "all over". Not worse than earlier. She denies shortness of breath and chest pain.     On physical exam  SBP 110s  HR 72  Palpable right radial pulse 2+  Palpable left brachial pulse 2+  Palpable DP bilaterally   Minimal tenderness to palpation of the abdomen, no guarding or rebound tenderness  Remains on 2L NC and Sat >95    Midnight labs H/H 12/43 (12/41)  WBC 19 (18)  Platelets 249 (281)    Reviewed CT chest with contrast --- notes at level of descending thoracic aorta possible concern for diaphragmatic injury and venous injury.       Plan  STAT CT angio abdomen/pelvis to assess for periaortic stranding seen earlier on CT scan    Discussed with on-call attending Dr. Lissa Merlin     Discussed with covering [place] team.     Scan ordered and will follow-up on results.     Ardelle Balls, MD  Vascular Surgery

## 2019-10-26 NOTE — Anesthesia Procedure Notes (Addendum)
----------------------------------------------------------------------------------------------------------------------------------------    Erector Spinae Nerve Block      Date of Procedure: 10/26/2019 4:01 PM    Laterality:  Left    Injection Technique: Catheter    Reason for Block: Post-op pain management and At Surgeon's request        Patient Location: Pre-op  CONSENT AND TIMEOUT     Consent:  Obtained per policy    Timeout: patient identified (name/DOB) , nerve block procedure/site/side verified against block consent form , nerve block procedure/site/side verified by patient or family, proper patient position verified, needed equipment, monitors, medications and access verified as present and functioning , allergies reviewed with patient/record , all members of the block team participated in the timeout and anticoagulation/antiplatelet status reviewed  METHOD     Patient Position:  R lateral    Monitoring:  Blood pressure, Continuous pulse ox with supplemental oxygen and EKG    Sedation Used:  Yes    Level of Sedation: Awake              for meds injected see MAR portion of chart    Prep:  Aseptic technique per protocol and Chloraprep    Approach:  In-plane and Superior and Posterior    Technique: Ultrasound guided       Attempts:  1   NEEDLE     Type:  Short-bevel     Gauge: 22 G     Length: 8 cm     Needle Insertion Depth: 7 cm  CATHETER     Catheter Type:  Open Tip    Catheter in Space:  5 cm    Catheter Mark at Skin:  12 cm    Catheter Aspirate:  Blood    Test Dose Response:  No Test Dose  BLOCK EVENTS      No Paresthesia with needle     No Paresthesia with injection     No significant resistance to injection     No significant pain on injection     No blood aspirated     No intravascular injection     Well Tolerated  STAFF     Performed by: Extender under direct supervision    Attending Attestation: I was present for the entire procedure     Attending: Benjamine Mola, MD  Extender: Shelda Altes,  MD  ----------------------------------------------------------------------------------------------------------------------------------------

## 2019-10-26 NOTE — Telephone (Signed)
BTICU Family Update    Family member updated via phone: Orthoptist #: [phone])  Regarding: patient status and plan of care for the day    Joy was made aware of her mother's low blood pressure and need for CT scans. All questions/concerns addressed and answered to satisfaction, encouraged to call back with any additional questions. She was very grateful for the update.     Levon Hedger, [place]  10/26/19 4:06 AM

## 2019-10-26 NOTE — Progress Notes (Addendum)
Report Given To  Brooke Bonito RN            Descriptive Sentence / Reason for Admission   [name] is a 74 y.o. female w/pmhx CAD s/p CABG 2012 in [place] and recent stenting angio 2015 on plavix, COPD, OSA, DM who presented as level II Trauma after fall down 12 stairs at nephew's house during family party.     Injuries:     odontoid fracture  splenic lac  rib fractures  possible pancreatic injury  concern for periaortic fat stranding possible aortic injury.     Vascular surgery consulted for assessment and management.       Active Issues / Relevant Events   615N: Admit to [facility]. Hypotensive 60/30s with AMS; BP then 80/40s after 2 x 1 L LR fluid boluses given. Given 1 unit PRBC and 1 unit platelets. EKG with trops sent.  R fem triple lumen CVC placed and L fem a-line placed. STAT CTA head/chest/abdomen completed. COVID NEG.  Echo done 1015 Serratus Block placed by APS 1550      To Do List  Q1 VS  Q1 I&O    Q12 H/H  Daily labs   COVID NEG    MRI c-spine without contrast be completed 6/15      Anticipatory Guidance / Discharge Planning  Transfer to floor when medically stable.    Foley Lot# [id number]  Daughter [name] took Gold ring and opal earrings home 6/15   Nursing 7 AM - 730 PM VSS, afebrile Alert and Oriented x 3. Patient placed on Room air at 815, Patient desated to 82 % at 831. Patient using incentive spirometer with encouragement able to do 500 cc. Echocardiogram/EKG done per order. Erector Spinae block placed by Dr Bobby Rumpf at 1550 and infusing per order. Medicated with Dilaudid 0.25 mg IVP at 0744/827, Dilaudid 0.5 mg IVP at 0945/1431  and Oxycodone 5 mg Po at 12 N for complaints of Left rib/chest/back pain with good effect. Tolerating clear liquid diet. No Bowel movement. Daughter [name] too home patients gold ring and opal earrings. Son/Daughter Joy in and updated on patients condition. See Doc flow sheet for complete assessment and VS. Leslye Peer RN

## 2019-10-26 NOTE — Progress Notes (Signed)
Vascular Surgery Daily Progress Note    Subjective:     CT scan overnight with no acute aortic pathology  Resting comfortably this morning. No abd pain      Objective:      BP: (64-169)/(33-85)   Temp:  [35.8 C (96.4 F)-37.5 C (99.5 F)]   Temp src: Foley (06/15 0500)  Heart Rate:  [57-88]   Resp:  [13-24]   SpO2:  [86 %-100 %]   Height:  [177.8 cm (5\' 10" )]   Weight:  [115.1 kg (253 lb 12 oz)-120.2 kg (265 lb)]     Physical Exam:      General appearance: alert, mild distress and cooperative  HEENT: extra ocular movement intact and neck supple with midline trachea  CV: regular rate and rhythm  Lungs: unlabored respirations on nonrebreather  Abd: soft.   Extremities:  Bilateral brachial pulses palpable 2+  Right radial palpable 2+  Left radial harvested for prior CABG  Bilateral femoral and DP pulses palpable 2+  Upper and lower extremities warm, with cap refill <3 sec   Neuro: normal without focal findings    Assessment:   [name] is a 74 y.o. female with periaortic enhancement of the descending thoracic aorta on CT abdomen/chest with IV contrast and L lumbar vein injury after fall down 12 stairs with associated splenic lac, odontoid fracture, and multiple rib fractures.     Follow scan with no acute aortic pathology. Left perinephric stranding of retroperitoneum tracking down psoas. Minimal change.       Plan:     Overall plan for the day: '    No concern for aortic injury  No need for 48 repeat as 24h scan improved.   - Perinephric stranding not from an aortic pathology.   Will repeat scan in 1 month - I will schedule follow up.       Vascular surgery will sign off at this time.       Please page the vascular surgery resident on call for any questions.  Macie Burows, MD   Vascular Surgery   10/26/2019 at 6:56 AM

## 2019-10-26 NOTE — Procedures (Signed)
Procedure Report      CENTRAL LINE INSERTION NOTE     Time out documentation completed in Procedure navigator prior to procedure:  Yes    *See separate Pulmonary Artery Catheter Insertion Procedure Note if also placed*    Indications  Hemodynamic Monitoring, Administration of Medication and Blood Draws    Procedure Details  1% lidocaine S.Q. 3 mL total    Vein initially entered with:  18G needle  Technique:  ultrasound and with wire guide  Correct placement confirmed by: ultrasound  Ectopy: no  Vein cannulated with:   Size: 37F   Length(cm): 20   Lumen: Triple   Coating: Sulfadiazine/Chlorhexidine  Insertion depth: 20 cm  Secured by: suturing and bioocclusive dressing    Successful placement: Yes  Venous Site: femoral  Attempted Sites: Right femoral  Guide Wire Removed Intact : yes    Complications  none    Condition  Pt tolerated procedure well. Yes  Patients condition at end of procedure: no change from prior    Plan/Orders  Chest X-Ray: not indicated    EBL  5 mL    Disposition  BTICU    Maudry Mayhew, MD  10/26/2019  3:56 AM

## 2019-10-26 NOTE — Provider Consult (Addendum)
UROLOGY CONSULT NOTE    Asked by primary team to evaluate patient for concern for ureteral injury found on imaging.    HPI:  [name] is a 74 y.o. female with a history of CAD s/p CABG (2012) and stenting (2015, on plavix), asthma, COPD and OSA     presented as level II trauma following fall down steps yesterday. She was found to have right scalp hematoma, left parietal scalp laceration, type III odontoid fracture, left 2 and 5-6 rib fxs, small splenic laceration, and periaortic fat stranding.     Pt became hypotensive overnight with STAT CT scan due to periaortic fat stranding previously seen. Vascular surgery consulted and determined there was no concern for aortic injury and no need for intervention from their standpoint. Pt received 2 L of IVF bolus and 2 unit RBC. Pt then became hypertensive with systolic BP [id number] and patient received hydralazine. Pt reports having some left sided pain but improved with recent pain medication administration. Denies hematuria or history of kidney/bladder issues.     Afebrile, BP 122/49  Cr 0.77  Hematocrit 38 (33, s/p 2 unit RBC)     CT Angio abdomen and pelvis today shows stable fat stranding/hematoma near the upper abdominal aorta. Increased hematoma in the left retroperitoneum and abutting the psoas muscle. Wall thickening of the proximal to mid ureter without contrast extravasation, mild associated left hydronephrosis. Stable 3 cm hematoma adjacent to pancreatic tail, small hematoma along left lower abdominal wall laterally.     We are consulted for left ureteral finding on CT angio    ROS:  As in HPI    PMH:    Past Medical History:   Diagnosis Date    Allergic rhinitis 09/30/2014    Arthritis     Asthma     CAD (coronary artery disease)     DVT (deep venous thrombosis)     post-op after CABG, on anticoagulation for ~2 years    PE (pulmonary embolism)     post-op after CABG, on anticoagulation for ~2 years    Prediabetes     Tubular adenoma 03/2015     colonoscopy        PSH:  Past Surgical History:   Procedure Laterality Date    ACHILLES TENDON SURGERY      APPENDECTOMY      CHOLECYSTECTOMY      CORONARY ANGIOPLASTY WITH STENT PLACEMENT      CORONARY ARTERY BYPASS GRAFT  2013    triple     CYST REMOVAL      both thumbs    PR REPAIR EYE BLOWOUT,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    Procedure: ORBIT ORIF;  Surgeon: Jannett Celestine, DDS;  Location: [facility] MAIN OR;  Service: OMFS    TONSILLECTOMY AND ADENOIDECTOMY         Allergies:   Allergies   Allergen Reactions    Morphine Hives     Has tolerated Percocet       Home Meds:  Medications Prior to Admission   Medication Sig    atorvastatin (LIPITOR) 40 MG tablet TAKE ONE TABLET BY MOUTH ONCE DAILY    budesonide (PULMICORT) 0.25 MG/2ML nebulizer solution Inhale 0.25 mg into the lungs daily    budesonide-formoterol (SYMBICORT) 160-4.5 MCG/ACT inhaler Inhale 2 puffs into the lungs 2 times daily    albuterol HFA (PROVENTIL, VENTOLIN, PROAIR HFA) 108 (90 Base) MCG/ACT inhaler Inhale 2 puffs into the lungs every 6 hours as needed    montelukast (SINGULAIR) 10  MG tablet Take 1 tablet (10 mg total) by mouth nightly    sertraline (ZOLOFT) 100 MG tablet Take 1 tablet (100 mg total) by mouth daily Replacing 50mg  dose, please cancel other refills    fluticasone-umeclidinium-vilanterol (TRELEGY ELLIPTA) 200-62.5-25 MCG/INH diskus inhaler Inhale 1 puff into the lungs daily    albuterol HFA (PROVENTIL, VENTOLIN, PROAIR HFA) 108 (90 Base) MCG/ACT inhaler Inhale 2 puffs into the lungs every 4-6 hours as needed    gabapentin (GABAPENTIN) 300 MG capsule Take 1 capsule (300 mg total) by mouth 3 times daily    pantoprazole (PROTONIX) 20 MG EC tablet Take 1 tablet (20 mg total) by mouth daily 30 min before breakfast Swallow whole. Do not crush, break, or chew.    azelastine (ASTELIN) 0.1 % nasal spray 1-2 sprays by Nasal route 2 times daily    Spacer/Aero-Holding Chambers (EASIVENT) spacer Use twice daily with Symbicort inhaler. Breathe in  slowly.    fluticasone (FLONASE) 50 MCG/ACT nasal spray 2 sprays by Nasal route daily    Non-System Medication Medication/Supply: R thumb spica brace  Directions for Use: wear nightly    ipratropium-albuterol (DUONEB) 0.5-2.5mg  /70mL nebulizer solution Take 3 mLs by nebulization every 6 hours    ipratropium (ATROVENT) 0.06 % nasal spray 2 sprays by Each Nare route 3 times daily    Misc. Devices (CPAP) machine Download and compliance report    atorvastatin (LIPITOR) 40 MG tablet Take 40 mg by mouth daily (with dinner)    Cholecalciferol (VITAMIN D3) 5000 UNITS TABS Take 5,000 Units by mouth every morning       docusate sodium (COLACE) 100 MG capsule Take 200 mg by mouth every morning       clopidogrel (PLAVIX) 75 MG tablet Take 75 mg by mouth every morning          Current Medications:  Current Facility-Administered Medications   Medication Dose Route Frequency    ondansetron (ZOFRAN) injection 4 mg  4 mg Intravenous Q6H PRN    sodium chloride 0.9 % flush 10 mL  10 mL Intravenous PRN    And    sodium chloride 0.9 % flush 20 mL  20 mL Intravenous PRN    HYDROmorphone PF (DILAUDID) injection 0.5 mg  0.5 mg Intravenous Q2H PRN    perflutren protein A microsph (OPTISON) injection 1.5 mL  1.5 mL Intravenous PRN    budesonide (PULMICORT) nebulizer solution 0.5 mg  0.5 mg Nebulization 2 times per day    albuterol (PROVENTIL) nebulization 2.5 mg  2.5 mg Nebulization Q6H PRN    bacitracin zinc-polymyxin B sulfate (POLYSPORIN) ointment   Topical 2 times per day    [START ON 10/27/2019] pantoprazole (PROTONIX) EC tablet 20 mg  20 mg Oral QAM    sertraline (ZOLOFT) tablet 100 mg  100 mg Oral Daily    lidocaine (LIDODERM) 5 % patch 1 patch  1 patch Transdermal Q24H    oxyCODONE (ROXICODONE) IR tablet 5 mg  5 mg Oral Q4H PRN    oxyCODONE (ROXICODONE) IR tablet 10 mg  10 mg Oral Q4H PRN    senna (SENOKOT) tablet 2 tablet  2 tablet Oral 2 times per day    polyethylene glycol (GLYCOLAX,MIRALAX) powder 17 g  17 g Oral Daily    bisacodyl  (DULCOLAX) suppository 10 mg  10 mg Rectal Daily PRN    ipratropium-albuterol (DUONEB) 0.5-2.5mg  /40mL nebulization solution 3 mL  3 mL Nebulization Q6H    acetaminophen (OFIRMEV) 1,000 mg IVPB 100 mL  1,000 mg Intravenous Q8H    sodium chloride 0.9 % FLUSH REQUIRED IF PATIENT HAS IV  0-500 mL/hr Intravenous PRN    dextrose 5 % FLUSH REQUIRED IF PATIENT HAS IV  0-500 mL/hr Intravenous PRN    electrolyte (PLASMALYTE) solution  100 mL/hr Intravenous Continuous        Social History:   Social History     Socioeconomic History    Marital status: Widowed     Spouse name: Not on file    Number of children: Not on file    Years of education: Not on file    Highest education level: Not on file   Tobacco Use    Smoking status: Never Smoker    Smokeless tobacco: Never Used   Substance and Sexual Activity    Alcohol use: No    Drug use: No    Sexual activity: Not on file   Other Topics Concern    Not on file   Social History Narrative    May 2016        Lives with her husband in an in law suite at her son's house        Three children, all live locally     Five grandchildren     Retired from school food service       Family Hx:  Family History   Problem Relation Age of Onset    Other Mother         "rare lung disease"    Aneurysm Mother         abdominal aortic aneurysm    Diabetes Type II Mother     Breast cancer Mother     Blood clots Mother     Stroke Father     Stroke Brother         x2    Thyroid cancer Daughter        Physical Exam:  24 Hr Vitals Ranges:    BP: (64-169)/(33-85)   Temp:  [35.8 C (96.4 F)-37.5 C (99.5 F)]   Temp src: Foley (06/15 1100)  Heart Rate:  [57-88]   Resp:  [11-24]   SpO2:  [81 %-100 %]   Height:  [177.8 cm (5\' 10" )]   Weight:  [115.1 kg (253 lb 12 oz)-120.2 kg (265 lb)]      Most recent vitals:    Blood pressure (!) 122/49, pulse 72, temperature 35.8 C (96.4 F), resp. rate 15, height 1.778 m (5\' 10" ), weight 115.1 kg (253 lb 12 oz), SpO2 99 %.     General: Alert but sleepy after pain  medication, appears stated age and cooperative, resting comfortably in bed  Chest: Unlabored respirations   Abdomen: Soft, non-distended, non-tender, no suprapubic tenderness, no guarding or rigidity. No ecchymosis noted to abdomen or left flank.   GENITOURINARY: foley in place draining clear yellow urine.  Psych: Affect pleasant, makes eye contact.     Labs:      Lab results: 10/26/19  0818 10/26/19  0341 10/26/19  0042 10/25/19  2048 10/25/19  2048   WBC  --  15.0* 19.5*  --  18.6*   Hemoglobin 11.3 10.2* 12.8   < > 12.3   Hematocrit 38 33* 43   < > 41   RBC  --  3.5* 4.6  --  4.4   Platelets  --  210 249  --  281   Neut # K/uL  --  13.2* 16.8*  --  14.4*  Lymph # K/uL  --  0.9* 1.0*  --  2.6   Mono # K/uL  --  0.9 1.4*  --  1.0*   Eos # K/uL  --  0.0 0.0  --  0.3   Baso # K/uL  --  0.0 0.1  --  0.1   Seg Neut %  --  87.6 86.1  --  77.3   Lymphocyte %  --  5.7 5.3  --  14.1   Monocyte %  --  5.7 7.1  --  5.5   Eosinophil %  --  0.1 0.2  --  1.3   Basophil %  --  0.3 0.5  --  0.6    < > = values in this interval not displayed.               Lab results: 10/26/19  0341 10/26/19  0042 10/25/19  2048 08/11/19  1557   Sodium 142 137  --  143   Potassium 4.4 CANCELED  --  4.2   Chloride 107 103  --  105   CO2 26 18*  --  30*   UN 16 19  --  14   Creatinine 0.77 0.84 0.98* 0.77   GFR,Caucasian 77 69  --  77   GFR,Black 89 80  --  89   Glucose 144* 142*  --  111*   Calcium 8.3* 9.2  --  9.5              Imaging:  EKG 12 lead    Result Date: 10/26/2019  Sinus rhythm Prolonged PR interval Nonspecific T abnormalities, diffuse leads    CT angio chest    Addendum Date: 10/26/2019    -------- ADDENDUM #2 -------- Findings discussed by phone with Dr. Melene Muller from vascular surgery 10/26/2019 at approximately 6:25 AM. Follow-up is deferred to the vascular surgery service. [facility] submits this DICOM format image data and final report to the [facility], an independent secure electronic health information exchange, on a  reciprocally searchable basis (with patient authorization) for a minimum of 12 months after exam date.-------- ADDENDUM #1 -------- While overall there is less periaortic hematoma, much of the decrease is seen more inferiorly; this hematoma appears slightly increased adjacent to some atelectatic lung on 501-243 which may be due to redistribution. No contrast extravasation. Vascular surgery consultation would be helpful. Would recommend reimaging with CTA at 48 hours from this exam, followed by CTA in one month if findings are stable or improving. Discussed by phone with Glorianne Manchester 6:10 AM 10/26/2019. [facility] submits this DICOM format image data and final report to the [facility], an independent secure electronic health information exchange, on a reciprocally searchable basis (with patient authorization) for a minimum of 12 months after exam date.    Addendum Date: 10/26/2019    -------- ADDENDUM #1 -------- While overall there is less periaortic hematoma, much of the decrease is seen more inferiorly; this hematoma appears slightly increased adjacent to some atelectatic lung on 501-243 which may be due to redistribution. No contrast extravasation. Vascular surgery consultation would be helpful. Would recommend reimaging with CTA at 48 hours from this exam, followed by CTA in one month if findings are stable or improving. Discussed by phone with Glorianne Manchester 6:10 AM 10/26/2019. [facility] submits this DICOM format image data and final report to the [facility], an independent secure electronic health information exchange, on a reciprocally searchable basis (with patient authorization) for  a minimum of 12 months after exam date.    Result Date: 10/26/2019  There is a 5 mm linear filling defect in the aortic arch posterior medially just distal to the left subclavian artery origin without contour deformity or surrounding hematoma, compatible with intimal injury. This is categorized as minimal aortic injury which  is amenable to nonoperative or medical management. Generally, suggested follow-up CTA is at 48 to 72 hours, then repeat CTA at 1 month. This finding has not changed significantly in retrospect compared to 10/25/2019 at 9:02 PM. Note that these injuries are associated with periaortic hematomas. Interval slight decrease in size of the periaortic hematoma involving the mid to lower thoracic and upper abdominal aorta. Aorta at these levels appears normal. No contrast extravasation. Left-sided rib fractures with small extra pleural hematoma. Small focal areas of groundglass opacities in the left lung apex compatible with pulmonary contusion, not significantly changed. END OF IMPRESSION I have personally reviewed the images and the Resident's/Fellow's interpretation and agree with or edited the findings. [facility] submits this DICOM format image data and final report to the [facility], an independent secure electronic health information exchange, on a reciprocally searchable basis (with patient authorization) for a minimum of 12 months after exam date.    CT angio neck carotid    Result Date: 10/25/2019  No evidence of acute carotid or vertebral artery dissection in the neck. Severe left and moderate-to-severe right internal carotid narrowing just distal to the bifurcation likely due to chronic atherosclerotic changes with greater than 70% stenosis bilaterally. END OF IMPRESSION I have personally reviewed the images and the Resident's/Fellow's interpretation and agree with or edited the findings. [facility] submits this DICOM format image data and final report to the [facility], an independent secure electronic health information exchange, on a reciprocally searchable basis (with patient authorization) for a minimum of 12 months after exam date.    CT abdomen and pelvis with IV contrast    Result Date: 10/25/2019  There is fat stranding and possible extravasation from the left lumbar vein and thickening of the  medial crus of the left hemidiaphragm. Adjacent to this site, there is a moderate degree of fat stranding around the aorta. This stranding is likely an extension of the injury to the left lumbar vein with venous bleeding. However, an aortic injury cannot be entirely ruled out. Small splenic laceration and perisplenic hematoma, consistent with a low grade splenic injury. 3 cm hematoma adjacent to the pancreatic tail may represent a low grade pancreatic tail injury. Mild endometrial thickening/low-attenuation material in the endometrial canal, more than expected for age. Recommend further evaluation with pelvic ultrasound on a nonemergent basis. Significant findings were conveyed to Dr. Marijean Bravo via phone by Dr. Oleta Mouse at 10:03 PM on 10/25/2019. END OF IMPRESSION I have personally reviewed the images and the Resident's/Fellow's interpretation and agree with or edited the findings. [facility] submits this DICOM format image data and final report to the [facility], an independent secure electronic health information exchange, on a reciprocally searchable basis (with patient authorization) for a minimum of 12 months after exam date.    CT chest with IV contrast    Result Date: 10/25/2019  Mildly displaced left posterior second rib fracture with small adjacent extrapleural hematoma. There are additional nondisplaced fractures of the posterior left fifth and sixth ribs. Few small areas of groundglass opacities within the left lung apex, likely representing pulmonary contusion/hemorrhage. There is soft tissue stranding around the mid descending thoracic  aorta with extension to the level of the abdominal aorta. This may represent small hematoma formation. No active extravasation is visualized. This can be from a venous injury or adjacent focal diaphragmatic injury. If clinically indicated, a repeat CT can be obtained in 12-24 hours with delayed phase images to evaluate for any persistent venous or occult aortic injury.  No contour abnormality in thoracic aorta, no intimal flap or intramural thrombus. No aortic pseudoaneurysm formation. Please see separately dictated CT abdomen and pelvis report for findings below the diaphragm and CT thoracic spine report for additional details. END OF IMPRESSION    CT maxillofacial without contrast    Result Date: 10/25/2019  No acute fracture of the maxillofacial skeleton. Partially visualized type III dens fracture. Please refer to separately dictated CT cervical spine for further description.. END OF IMPRESSION [facility] submits this DICOM format image data and final report to the [facility], an independent secure electronic health information exchange, on a reciprocally searchable basis (with patient authorization) for a minimum of 12 months after exam date.    CT head without contrast for TRAUMA    Result Date: 10/25/2019  No acute intracranial abnormality. No acute intracranial hemorrhage. No displaced calvarial fracture. Partly visualized likely type III odontoid fracture. Please see separately dictated CT cervical spine for further evaluation. Large approximate 9 cm hematoma at the right frontal scalp and a large laceration to the left parietal scalp as described above. Please see additional separately dictated CT reports for further evaluation Findings discussed with covering provider Dr. Ernie Hew via phone at 9:20 PM on 10/25/2019. END OF IMPRESSION I have personally reviewed the images and the Resident's/Fellow's interpretation and agree with or edited the findings. [facility] submits this DICOM format image data and final report to the [facility], an independent secure electronic health information exchange, on a reciprocally searchable basis (with patient authorization) for a minimum of 12 months after exam date.    Chest single frontal view    Result Date: 10/25/2019  Mildly displaced left upper rib fractures, involving at least the second rib. Small adjacent extrapleural hematoma  Hypoexpanded lungs. END OF IMPRESSION I have personally reviewed the images and the Resident's/Fellow's interpretation and agree with or edited the findings. [facility] submits this DICOM format image data and final report to the [facility], an independent secure electronic health information exchange, on a reciprocally searchable basis (with patient authorization) for a minimum of 12 months after exam date.    CT lumbar spine reprocess from CT    Result Date: 10/25/2019  No acute lumbosacral fracture. Please see the concurrent CT abdomen and pelvis report for a complete discussion of intra-abdominal findings. END OF IMPRESSION I have personally reviewed the images and the Resident's/Fellow's interpretation and agree with or edited the findings. [facility] submits this DICOM format image data and final report to the [facility], an independent secure electronic health information exchange, on a reciprocally searchable basis (with patient authorization) for a minimum of 12 months after exam date.    CT thoracic spine reprocess from CT    Result Date: 10/25/2019  Motion artifact degrades image quality and limits sensitivity of the examination. No acute thoracic spine fracture. END OF IMPRESSION [facility] submits this DICOM format image data and final report to the [facility], an independent secure electronic health information exchange, on a reciprocally searchable basis (with patient authorization) for a minimum of 12 months after exam date.    CT angio abdomen and pelvis  Result Date: 10/26/2019  Stable fat stranding/hematoma about the upper abdominal aorta. There is increased hematoma in the left retroperitoneum and abutting the psoas muscle, but this does not appear to be associated with the abdominal aorta and may be associated with venous bleed. Discussed with Dr. Virl Cagey from vascular surgery by phone 6:25 AM 10/26/2019. There is no evidence of extravasation of contrast from the abdominal aorta . No  direct injury seen to the abdominal aorta. There is some wall thickening of the proximal to mid ureter without contrast extravasation, suspicious for possible mural injury to the ureter. There is mild associated left hydronephrosis. Consider short interval follow-up ultrasound rule out increasing  hydronephrosis. Stable 3 cm hematoma adjacent pancreatic tail. Follow-up of lipase/amylase would be helpful to rule out any posttraumatic pancreatitis involving the pancreatic tail. Small hematoma along left lower abdominal wall laterally. Small amount of blood now in the left anterior pelvis just deep to the ventral abdominal wall. No active bleeding visualized. END OF IMPRESSION I have personally reviewed the images and the Resident's/Fellow's interpretation and agree with or edited the findings. [facility] submits this DICOM format image data and final report to the [facility], an independent secure electronic health information exchange, on a reciprocally searchable basis (with patient authorization) for a minimum of 12 months after exam date.    CT Cervical Spine Reprocess from CT    Result Date: 10/25/2019  Acute mildly displaced type III odontoid fracture without narrowing of the central spinal canal. Please see additional separately dictated CT reports. Findings discussed with covering provider Dr. Ernie Hew via phone at 9:20 PM on 10/25/2019. END OF IMPRESSION I have personally reviewed the images and the Resident's/Fellow's interpretation and agree with or edited the findings. [facility] submits this DICOM format image data and final report to the [facility], an independent secure electronic health information exchange, on a reciprocally searchable basis (with patient authorization) for a minimum of 12 months after exam date.       Assessment:  [name] is a 74 y.o. female with a history of CAD s/p CABG (2012) and stenting (2015, on plavix), asthma, COPD and OSA who presented as level II trauma  following fall down steps yesterday.     She was found to have right scalp hematoma, left parietal scalp laceration, type III odontoid fracture, left 2 and 5-6 rib fxs, small splenic laceration, and periaortic fat stranding.     CT Angio abdomen and pelvis today shows stable fat stranding/hematoma near the upper abdominal aorta. Increased hematoma in the left retroperitoneum and abutting the psoas muscle, no injury evident to kidney. Wall thickening of the proximal to mid ureter without contrast extravasation, contrast present in ureter distal to area of thickening. Stable 3 cm hematoma adjacent to pancreatic tail, small hematoma along left lower abdominal wall laterally. Foley catheter draining clear yellow urine and Cr normal.    Plan:  We recommend the following:    - No acute urologic intervention necessary at this time.  - Low concern for ureteral injury or other injury to urinary tract.  - If repeat CT imaging with contrast obtained while inpatient, please get a delayed phase of the abdomen to assess the urinary tract, but no need for further dedicated imaging at this time.  - Recommend outpatient follow up with Renal US in 1 month. Can discuss need for further imaging at that time.   - Please call the urology consult pager with any questions.  Discussed with Dr Dortha Kern    Diagnoses for this hospitalization include:  Left rib fractures, Odontoid fracture, small splenic laceration    Thank you again for asking the Urology Consult Service to participate in your patient's care. Please page the Urology Consult resident/PA with any questions related to this patient Urology Consult Pager.      Park Pope, PA  Urology

## 2019-10-26 NOTE — Progress Notes (Signed)
Respiratory Therapy  Weekly Summary Note    Date: 10/26/2019                         Time: 5:35 AM    Initial Assessment    Subjective   Pt is on room air.     Objective   Patient Vitals for the past 12 hrs:   BP Temp Temp src Pulse Resp SpO2 Height Weight   10/26/19 0508  37.3 C (99.1 F)  71 16 98 %     10/26/19 0502 127/59 37.2 C (99 F)  73 16 98 %     10/26/19 0500  37.3 C (99.1 F) FOLEY 71 14 99 %     10/26/19 0441 122/74 37.3 C (99.1 F)  72 16      10/26/19 0400 122/57   73 15 100 %     10/26/19 0300 (!) 106/45   71 15 98 %     10/26/19 0200 101/53   83 17 93 %     10/26/19 0140 (!) 64/33          10/26/19 0100    88 20 97 %     10/26/19 0000 116/50   84 17 97 %     10/25/19 2350      (!) 86 %  115.1 kg (253 lb 12 oz)   10/25/19 2346 122/52 37.5 C (99.5 F) Axillary 83 18 91 %     10/25/19 2109 145/85 36 C (96.8 F)  87 18 100 %     10/25/19 2043 140/68   66 14 100 %     10/25/19 2036      100 %     10/25/19 2032 101/81   57 13      10/25/19 2030  35.8 C (96.4 F)         10/25/19 2021 169/70 36 C (96.8 F)  70 24 95 % 1.778 m (5\' 10" ) 120.2 kg (265 lb)       Lung Sounds:   Cough present: Weak  Bilateral Breath Sounds: Clear;Diminished     RESPIRATORY / METABOLIC / BLOOD LEVELS:         Blood Levels  Hemoglobin: (!) 10.2  Platelets: 210  Albumin: 3.8                        Ventilator Settings:          Other Relevant Findings:  None    Assessment  Pt is a 74 y/o female s/p fall down stairs. VSS @ this time. Continue to monitor.     Plan  Follow MD plan of care.    Marlyce Huge, RT 5:35 AM 10/26/2019

## 2019-10-27 ENCOUNTER — Telehealth: Payer: Self-pay

## 2019-10-27 ENCOUNTER — Encounter: Payer: Self-pay | Admitting: Anesthesiology

## 2019-10-27 ENCOUNTER — Telehealth: Payer: Self-pay | Admitting: Urology

## 2019-10-27 ENCOUNTER — Other Ambulatory Visit: Payer: Self-pay | Admitting: Cardiovascular Disease

## 2019-10-27 ENCOUNTER — Inpatient Hospital Stay: Payer: Medicare (Managed Care)

## 2019-10-27 DIAGNOSIS — M25562 Pain in left knee: Secondary | ICD-10-CM

## 2019-10-27 DIAGNOSIS — R935 Abnormal findings on diagnostic imaging of other abdominal regions, including retroperitoneum: Secondary | ICD-10-CM

## 2019-10-27 LAB — PLATELETS,PHERESIS
Coded Blood type: 5100
Component blood type: O POS
Dispense status: TRANSFUSED

## 2019-10-27 LAB — BASIC METABOLIC PANEL
Anion Gap: 7 (ref 7–16)
CO2: 30 mmol/L — ABNORMAL HIGH (ref 20–28)
Calcium: 8.7 mg/dL (ref 8.6–10.2)
Chloride: 103 mmol/L (ref 96–108)
Creatinine: 0.66 mg/dL (ref 0.51–0.95)
GFR,Black: 101 *
GFR,Caucasian: 88 *
Glucose: 142 mg/dL — ABNORMAL HIGH (ref 60–99)
Lab: 12 mg/dL (ref 6–20)
Potassium: 4.5 mmol/L (ref 3.3–5.1)
Sodium: 140 mmol/L (ref 133–145)

## 2019-10-27 LAB — MAGNESIUM: Magnesium: 2.4 mg/dL (ref 1.6–2.5)

## 2019-10-27 LAB — HCT AND HGB
Hematocrit: 35 % (ref 34–45)
Hemoglobin: 10.6 g/dL — ABNORMAL LOW (ref 11.2–15.7)

## 2019-10-27 LAB — RED BLOOD CELLS
Coded Blood type: 5100
Component blood type: O POS
Dispense status: TRANSFUSED

## 2019-10-27 LAB — LIPASE: Lipase: 12 U/L — ABNORMAL LOW (ref 13–60)

## 2019-10-27 LAB — MCHC: MCHC: 31 g/dL — ABNORMAL LOW (ref 32–36)

## 2019-10-27 LAB — PHOSPHORUS: Phosphorus: 3.3 mg/dL (ref 2.7–4.5)

## 2019-10-27 LAB — AMYLASE: Amylase: 27 U/L — ABNORMAL LOW (ref 28–100)

## 2019-10-27 MED ORDER — OXYCODONE HCL 5 MG PO TABS *I*
5.0000 mg | ORAL_TABLET | ORAL | Status: DC | PRN
Start: 2019-10-28 — End: 2019-10-28

## 2019-10-27 MED ORDER — HYDROMORPHONE HCL PF 1 MG/ML IJ SOLN *WRAPPED*
0.2500 mg | Freq: Once | INTRAMUSCULAR | Status: AC
Start: 2019-10-27 — End: 2019-10-27
  Administered 2019-10-27: 0.25 mg via INTRAVENOUS
  Filled 2019-10-27: qty 0.5

## 2019-10-27 MED ORDER — OXYCODONE HCL 10 MG PO TABS *I*
10.0000 mg | ORAL_TABLET | Freq: Four times a day (QID) | ORAL | Status: DC | PRN
Start: 2019-10-27 — End: 2019-10-27
  Administered 2019-10-27: 10 mg via ORAL
  Filled 2019-10-27: qty 1

## 2019-10-27 MED ORDER — OXYCODONE HCL 5 MG PO TABS *I*
5.0000 mg | ORAL_TABLET | Freq: Four times a day (QID) | ORAL | Status: DC | PRN
Start: 2019-10-27 — End: 2019-10-27

## 2019-10-27 MED ORDER — ENOXAPARIN SODIUM 30 MG/0.3ML IJ SOSY *I*
30.0000 mg | PREFILLED_SYRINGE | Freq: Two times a day (BID) | INTRAMUSCULAR | Status: DC
Start: 2019-10-27 — End: 2019-10-28
  Administered 2019-10-27 – 2019-10-28 (×2): 30 mg via SUBCUTANEOUS
  Filled 2019-10-27 (×2): qty 0.3

## 2019-10-27 MED ORDER — OXYCODONE HCL 10 MG PO TABS *I*
10.0000 mg | ORAL_TABLET | ORAL | Status: DC | PRN
Start: 2019-10-28 — End: 2019-10-28
  Administered 2019-10-27 – 2019-10-28 (×2): 10 mg via ORAL
  Filled 2019-10-27 (×2): qty 1

## 2019-10-27 MED ORDER — ACETAMINOPHEN 500 MG PO TABS *I*
1000.0000 mg | ORAL_TABLET | Freq: Three times a day (TID) | ORAL | Status: DC
Start: 2019-10-27 — End: 2019-11-09
  Administered 2019-10-27 – 2019-11-08 (×37): 1000 mg via ORAL
  Filled 2019-10-27 (×37): qty 2

## 2019-10-27 NOTE — Progress Notes (Signed)
Report Given To  Luana Shu, RN    6/16 D12 0700-1900:    Afebrile. SBP's ranging from 140-150's; Team aware. PRN Oxy given with no effect on SBP, but good effect on pain. Attempted to wean supplemental O2 to [facility]; pt desaturated to 87-89%. Supplemental O2 currently at [facility], pt tolerating well. Pain moderately controlled with APS block and PRN Oxy given x1. APS at the bedside, pt given bolus dose of bupivacaine; given with positive effect. A-line removed; foley removed: pt due to void by 22:00. No BM. Diet advanced to Regular Diet; adequate PO intake. Family updated at the bedside by nursing and Provider Team. For additional information, please refer to [facility] and Flowsheets.    [name] Latoya Huguenin, RN    Descriptive Sentence / Reason for Admission   [name] is a 74 y.o. female w/pmhx CAD s/p CABG 2012 in [place] and recent stenting angio 2015 on plavix, COPD, OSA, DM who presented as level II Trauma after fall down 12 stairs at nephew's house during family party.     Injuries:     - right scalp hematoma  - left parietal scalp laceration  - type 3 odontoid fx  - left rib fracture  - splenic laceration    Vascular surgery consulted for assessment and management.         Active Issues / Relevant Events   615N: Admit to [place]. Hypotensive 60/30s with AMS; BP then 80/40s after 2 x 1 L LR fluid boluses given. Given 1 unit PRBC and 1 unit platelets. EKG with trops sent.  R fem triple lumen CVC placed and L fem a-line placed. STAT CTA head/chest/abdomen completed. COVID NEG.  6/15D: Echo done 1015 Erector Spinae Block placed by APS 1550  6/16N: Hypertensive SBPs 170-180s. Given prn 0.5 dilaudid x 1 and 5 mg oxy with no therapeutic effect. Provider notified; pt given 1 time dose 0.25 dilaudid, 10 mg oxy and bupivicaine bolus on PCEA. EKG completed.       To Do List  Q1 VS  Q1 I&O  Q4 Neuro checks  Regular Diet  Daily labs     Hold home Gabapentin per providers    COVID NEG 6/14    MRI c-spine without contrast be completed            Anticipatory Guidance / Discharge Planning  Transfer to floor when medically stable.    Foley Lot# [id number]  Daughter [name] took Gold ring and opal earrings home 6/15

## 2019-10-27 NOTE — H&P (Signed)
Trauma Surgery Tertiary Evaluation    Patient: [name]    Date of Injury: 6/14  Date of Tertiary Evaluation: 6/15    HISTORY  [name] is a 74 y.o. female who presented to the hospital as a Level 2 on 10/25/2019 after fall from 15 stairs.    SUBJECTIVE  Is patient able to participate  in evaluation: yes     Patient seen and evaluated in the ICU.     OBJECTIVE  Temp:  [36 C (96.8 F)-36.8 C (98.2 F)] 36.4 C (97.5 F)  Heart Rate:  [62-83] 67  Resp:  [11-19] 12  BP: (85-155)/(42-68) 143/66  Arterial Line BP: (116-181)/(50-71) 163/65    Physical Exam  Constitutional:       General: She is not in acute distress.  HENT:      Head: Normocephalic.      Comments: Hematoma of R frontal forehead, supraorbital     Right Ear: External ear normal.      Left Ear: External ear normal.      Nose: No congestion.   Eyes:      General: No scleral icterus.     Extraocular Movements: Extraocular movements intact.      Pupils: Pupils are equal, round, and reactive to light.   Neck:      Comments: Cervical collar  Cardiovascular:      Rate and Rhythm: Normal rate and regular rhythm.      Pulses: Normal pulses.   Pulmonary:      Effort: Pulmonary effort is normal. No respiratory distress.      Breath sounds: No wheezing.   Chest:      Chest wall: No tenderness.   Abdominal:      General: There is no distension.      Tenderness: There is abdominal tenderness. There is guarding.   Musculoskeletal:         General: Tenderness (L knee) present. No deformity.      Comments: Small abrasion over L knee   Neurological:      General: No focal deficit present.      Mental Status: She is alert and oriented to person, place, and time.      Cranial Nerves: No cranial nerve deficit.      Sensory: No sensory deficit.       Neurologic Exam     Mental Status   Oriented to person, place, and time.     Cranial Nerves     CN III, IV, VI   Pupils are equal, round, and reactive to light.        PSYCHOSOCIAL   Problem  Alcohol Use (CAGE):       Do you consume alcohol? no     If yes, continue screening:   1. Do you feel the need to cut down on drinking? N/A   2. Have you been annoyed by criticism regarding your alcohol use? N/A   3. Have you felt guilty about your drinking?  N/A   4. Have you had an 'eye opener' experience with your alcohol use (ie: drinking first thing in the morning or drink to rid a hangover)? N/A     SCORE: (Sum of Scores #1-4) -- No/NA = 0   CAGE Score >/= 2? no   Intervention and Referral Performed? No       Partner Violence Screen (PVS)   1. Have you been physically hurt by a partner within the past year? no  2. Do you feel unsafe in your current relationships? no   3. Is there anyone from a prior relationship currently making you feel unsafe? no   SCORE: (Sum of Scores #1-3) -- No/NA = 0   For Score > 0: Inpatient Consult to Social Work Ordered? No       Imaging Studies: EKG 12 lead    Result Date: 10/26/2019  Sinus rhythm Prolonged PR interval Nonspecific T abnormalities, diffuse leads    CT angio chest    Addendum Date: 10/26/2019    -------- ADDENDUM #2 -------- Findings discussed by phone with Dr. Melene Muller from vascular surgery 10/26/2019 at approximately 6:25 AM. Follow-up is deferred to the vascular surgery service. [facility] submits this DICOM format image data and final report to the [facility], an independent secure electronic health information exchange, on a reciprocally searchable basis (with patient authorization) for a minimum of 12 months after exam date.-------- ADDENDUM #1 -------- While overall there is less periaortic hematoma, much of the decrease is seen more inferiorly; this hematoma appears slightly increased adjacent to some atelectatic lung on 501-243 which may be due to redistribution. No contrast extravasation. Vascular surgery consultation would be helpful. Would recommend reimaging with CTA at 48 hours from this exam, followed by CTA in one month if findings are stable or  improving. Discussed by phone with Glorianne Manchester 6:10 AM 10/26/2019. [facility] submits this DICOM format image data and final report to the [facility], an independent secure electronic health information exchange, on a reciprocally searchable basis (with patient authorization) for a minimum of 12 months after exam date.    Addendum Date: 10/26/2019    -------- ADDENDUM #1 -------- While overall there is less periaortic hematoma, much of the decrease is seen more inferiorly; this hematoma appears slightly increased adjacent to some atelectatic lung on 501-243 which may be due to redistribution. No contrast extravasation. Vascular surgery consultation would be helpful. Would recommend reimaging with CTA at 48 hours from this exam, followed by CTA in one month if findings are stable or improving. Discussed by phone with Glorianne Manchester 6:10 AM 10/26/2019. [facility] submits this DICOM format image data and final report to the [facility], an independent secure electronic health information exchange, on a reciprocally searchable basis (with patient authorization) for a minimum of 12 months after exam date.    Result Date: 10/26/2019  There is a 5 mm linear filling defect in the aortic arch posterior medially just distal to the left subclavian artery origin without contour deformity or surrounding hematoma, compatible with intimal injury. This is categorized as minimal aortic injury which is amenable to nonoperative or medical management. Generally, suggested follow-up CTA is at 48 to 72 hours, then repeat CTA at 1 month. This finding has not changed significantly in retrospect compared to 10/25/2019 at 9:02 PM. Note that these injuries are associated with periaortic hematomas. Interval slight decrease in size of the periaortic hematoma involving the mid to lower thoracic and upper abdominal aorta. Aorta at these levels appears normal. No contrast extravasation. Left-sided rib fractures with small extra pleural hematoma.  Small focal areas of groundglass opacities in the left lung apex compatible with pulmonary contusion, not significantly changed. END OF IMPRESSION I have personally reviewed the images and the Resident's/Fellow's interpretation and agree with or edited the findings. [facility] submits this DICOM format image data and final report to the [facility], an independent secure electronic health information exchange, on a reciprocally searchable basis (with patient  authorization) for a minimum of 12 months after exam date.    CT angio neck carotid    Result Date: 10/25/2019  No evidence of acute carotid or vertebral artery dissection in the neck. Severe left and moderate-to-severe right internal carotid narrowing just distal to the bifurcation likely due to chronic atherosclerotic changes with greater than 70% stenosis bilaterally. END OF IMPRESSION I have personally reviewed the images and the Resident's/Fellow's interpretation and agree with or edited the findings. [facility] submits this DICOM format image data and final report to the [facility], an independent secure electronic health information exchange, on a reciprocally searchable basis (with patient authorization) for a minimum of 12 months after exam date.    CT abdomen and pelvis with IV contrast    Result Date: 10/25/2019  There is fat stranding and possible extravasation from the left lumbar vein and thickening of the medial crus of the left hemidiaphragm. Adjacent to this site, there is a moderate degree of fat stranding around the aorta. This stranding is likely an extension of the injury to the left lumbar vein with venous bleeding. However, an aortic injury cannot be entirely ruled out. Small splenic laceration and perisplenic hematoma, consistent with a low grade splenic injury. 3 cm hematoma adjacent to the pancreatic tail may represent a low grade pancreatic tail injury. Mild endometrial thickening/low-attenuation material in the endometrial canal,  more than expected for age. Recommend further evaluation with pelvic ultrasound on a nonemergent basis. Significant findings were conveyed to Dr. Marijean Bravo via phone by Dr. Oleta Mouse at 10:03 PM on 10/25/2019. END OF IMPRESSION I have personally reviewed the images and the Resident's/Fellow's interpretation and agree with or edited the findings. [facility] submits this DICOM format image data and final report to the [facility], an independent secure electronic health information exchange, on a reciprocally searchable basis (with patient authorization) for a minimum of 12 months after exam date.    CT chest with IV contrast    Result Date: 10/26/2019  Mildly displaced left posterior second rib fracture with small adjacent extrapleural hematoma. There are additional nondisplaced fractures of the posterior left fifth and sixth ribs. Few small areas of groundglass opacities within the left lung apex, likely representing pulmonary contusion/hemorrhage. There is soft tissue stranding around the mid descending thoracic aorta with extension to the level of the abdominal aorta. This may represent small hematoma formation. No active extravasation is visualized. This can be from a venous injury or adjacent focal diaphragmatic injury. If clinically indicated, a repeat CT can be obtained in 12-24 hours with delayed phase images to evaluate for any persistent venous or occult aortic injury. No contour abnormality in thoracic aorta. No aortic pseudoaneurysm formation. Please see separately dictated CT abdomen and pelvis report for findings below the diaphragm and CT thoracic spine report for additional details. END OF IMPRESSION I have personally reviewed the images and the Resident's/Fellow's interpretation and agree with or edited the findings. [facility] submits this DICOM format image data and final report to the [facility], an independent secure electronic health information exchange, on a reciprocally searchable basis  (with patient authorization) for a minimum of 12 months after exam date.    CT maxillofacial without contrast    Result Date: 10/25/2019  No acute fracture of the maxillofacial skeleton. Partially visualized type III dens fracture. Please refer to separately dictated CT cervical spine for further description.. END OF IMPRESSION [facility] submits this DICOM format image data and final report to the  [facility], an independent secure electronic health information exchange, on a reciprocally searchable basis (with patient authorization) for a minimum of 12 months after exam date.    CT head without contrast for TRAUMA    Result Date: 10/25/2019  No acute intracranial abnormality. No acute intracranial hemorrhage. No displaced calvarial fracture. Partly visualized likely type III odontoid fracture. Please see separately dictated CT cervical spine for further evaluation. Large approximate 9 cm hematoma at the right frontal scalp and a large laceration to the left parietal scalp as described above. Please see additional separately dictated CT reports for further evaluation Findings discussed with covering provider Dr. Ernie Hew via phone at 9:20 PM on 10/25/2019. END OF IMPRESSION I have personally reviewed the images and the Resident's/Fellow's interpretation and agree with or edited the findings. [facility] submits this DICOM format image data and final report to the [facility], an independent secure electronic health information exchange, on a reciprocally searchable basis (with patient authorization) for a minimum of 12 months after exam date.    Chest single frontal view    Result Date: 10/25/2019  Mildly displaced left upper rib fractures, involving at least the second rib. Small adjacent extrapleural hematoma Hypoexpanded lungs. END OF IMPRESSION I have personally reviewed the images and the Resident's/Fellow's interpretation and agree with or edited the findings. [facility] submits this DICOM format image data and  final report to the [facility], an independent secure electronic health information exchange, on a reciprocally searchable basis (with patient authorization) for a minimum of 12 months after exam date.    CT lumbar spine reprocess from CT    Result Date: 10/25/2019  No acute lumbosacral fracture. Please see the concurrent CT abdomen and pelvis report for a complete discussion of intra-abdominal findings. END OF IMPRESSION I have personally reviewed the images and the Resident's/Fellow's interpretation and agree with or edited the findings. [facility] submits this DICOM format image data and final report to the [facility], an independent secure electronic health information exchange, on a reciprocally searchable basis (with patient authorization) for a minimum of 12 months after exam date.    CT thoracic spine reprocess from CT    Result Date: 10/25/2019  Motion artifact degrades image quality and limits sensitivity of the examination. No acute thoracic spine fracture. END OF IMPRESSION [facility] submits this DICOM format image data and final report to the [facility], an independent secure electronic health information exchange, on a reciprocally searchable basis (with patient authorization) for a minimum of 12 months after exam date.    CT angio abdomen and pelvis    Result Date: 10/26/2019  Stable fat stranding/hematoma about the upper abdominal aorta. There is increased hematoma in the left retroperitoneum and abutting the psoas muscle, but this does not appear to be associated with the abdominal aorta and may be associated with venous bleed. Discussed with Dr. Virl Cagey from vascular surgery by phone 6:25 AM 10/26/2019. There is no evidence of extravasation of contrast from the abdominal aorta . No direct injury seen to the abdominal aorta. There is some wall thickening of the proximal to mid ureter without contrast extravasation, suspicious for possible mural injury to the ureter. There is mild associated  left hydronephrosis. Consider short interval follow-up ultrasound rule out increasing  hydronephrosis. Stable 3 cm hematoma adjacent pancreatic tail. Follow-up of lipase/amylase would be helpful to rule out any posttraumatic pancreatitis involving the pancreatic tail. Small hematoma along left lower abdominal wall laterally. Small amount of blood now  in the left anterior pelvis just deep to the ventral abdominal wall. No active bleeding visualized. END OF IMPRESSION I have personally reviewed the images and the Resident's/Fellow's interpretation and agree with or edited the findings. [facility] submits this DICOM format image data and final report to the [facility], an independent secure electronic health information exchange, on a reciprocally searchable basis (with patient authorization) for a minimum of 12 months after exam date.    CT Cervical Spine Reprocess from CT    Result Date: 10/25/2019  Acute mildly displaced type III odontoid fracture without narrowing of the central spinal canal. Please see additional separately dictated CT reports. Findings discussed with covering provider Dr. Ernie Hew via phone at 9:20 PM on 10/25/2019. END OF IMPRESSION I have personally reviewed the images and the Resident's/Fellow's interpretation and agree with or edited the findings. [facility] submits this DICOM format image data and final report to the [facility], an independent secure electronic health information exchange, on a reciprocally searchable basis (with patient authorization) for a minimum of 12 months after exam date.        Imaging Review:  All relevant imaging reviewed:  yes  New Findings on Imaging?  no         OVERVIEW OF TERTIARY EVALUATION      SUMMARY OF INJURIES      Type III odontoid fx   Scalp laceration    9 cm frontal scalp hematoma   Periaortic fat stranding    5 mm linear filling defect in the aortic arch posterior medially just distal to the left subclavian artery origin without contour  deformity or surrounding hematoma, compatible with intimal injury   Hematoma in the left retroperitoneum and abutting the psoas muscle   ? Lumbar vein extravasation   ? Diaphragmatic injury   Splenic laceration with perisplenic hematoma   3 cm hematoma adjacent to the pancreatic tail may represent a low grade pancreatic tail injury   L 2, 5-6 rib fx    Small extrapleural hematoma   Left pulmonary contusion   There is some wall thickening of the proximal to mid ureter without contrast extravasation, suspicious for possible mural injury to the ureter. There is mild associated left hydronephrosis.    INCIDENTAL FINDINGS  Incidental findings noted? Yes          Findings:  - Sequelae of prior CABG surgery. Cardiomegaly. Mild atherosclerotic calcification of the aorta. Soft plaque in descending thoracic aorta   - Stable 6 mm left lower lobe nodule.  - Mild osteopenia  - Severe left and moderate-to-severe right internal carotid narrowing just distal to the bifurcation likely due to chronic atherosclerotic changes with greater than 70% stenosis bilaterally.  - Mild endometrial thickening/low-attenuation material in the endometrial canal, more than expected for age. Recommend further evaluation with pelvic ultrasound on a nonemergent basis  - Mild colonic diverticulosis  - Moderate multilevel degenerative changes of the lumbar spine  - Mild degenerative changes at multiple joints of the hand is prominent at the basal joint and fourth PIP  - Ventral abdominal wall hematoma    Appropriate Service Consulted for Follow Up: Yes   PCP Notified: yes   Comment: RN chart review perform, will send summary of incidentals upon discharge    NEW COMPLAINTS  New Complaints on Tertiary Evaluation:  yes   L knee pain  Follow up:    L knee [place]   Vascular Surgery   Urology  Plastic Surgery   Neurosurgery   APS    PLAN & RECOMMENDATIONS   No new injuries identified   C-Spine: Spine not cleared - known  fracture/tenderness: remains on precautions   DVT Prophylaxis: Held    Weightbearing status: Currently bedrest    Author: Herbert Seta, PA as of: 10/27/2019  at: 12:34 PM

## 2019-10-27 NOTE — Consults (Signed)
Social Work Consult Note    SW met with patient to complete SBIRT consult.   Patient reports that she does not drink or utilize illicit drugs.   No need for intervention noted at this time.    Stevphen Meuse, LMSW  October 27, 2019

## 2019-10-27 NOTE — Telephone Encounter (Signed)
Sp fall.   Abnormal CT left proximal ureter    Needs office visit in 1 month, with renal US prior    Margaretha Glassing, MD

## 2019-10-27 NOTE — Plan of Care (Signed)
Problem: Safety  Goal: Patient will remain free of falls  Outcome: Maintaining  Goal: Prevent any intentional injury  Outcome: Maintaining     Problem: Pain/Comfort  Goal: Patient's pain or discomfort is manageable  Outcome: Progressing towards goal     Problem: Nutrition  Goal: Nutritional status is maintained or improved - Geriatric  Outcome: Progressing towards goal     Problem: Mobility  Goal: Functional status is maintained or improved - Geriatric  Outcome: Progressing towards goal     Problem: Psychosocial  Goal: Demonstrates ability to cope with illness  Outcome: Maintaining     Problem: Cognitive function  Goal: Cognitive function will be maintained or return to baseline  Outcome: Maintaining     Problem: Potential Alteration in Skin Integrity - Pressure Ulcer  Goal: The patient will maintain intact skin free of pressure ulcer  Outcome: Maintaining

## 2019-10-27 NOTE — Progress Notes (Signed)
Trauma & Acute Care Surgery Progress Note     LOS: 3 days     Subjective:    No acute events  Weaning NC  Pain controlled with block  Tolerating diet  Awaiting C spine MRI      Objective:    Vitals Sign Ranges for Past 24 Hours:  BP: (90-155)/(41-68)   Temp:  [36.4 C (97.5 F)-37.2 C (98.9 F)]   Temp src: Axillary (06/16 2000)  Heart Rate:  [62-78]   Resp:  [11-17]   SpO2:  [89 %-99 %]       Physical Exam:    General Appearance: NAD, sleeping comfortably  HEENT: C-Collar in place  Cardiac: RRR  Respiratory: nonlabored  Abdomen: obese, soft, nt  Extremities: WWP    Labs:    CBC:  Recent Labs   Lab 10/27/19  1202 10/26/19  2319 10/26/19  2319 10/26/19  1308 10/26/19  1308 10/26/19  0818 10/26/19  0818 10/26/19  0341 10/26/19  0341 10/26/19  0042 10/26/19  0042 10/25/19  2048 10/25/19  2048   WBC  --   --  14.6*  --   --   --   --   --  15.0*  --  19.5*  --  18.6*   Hemoglobin 10.6*   < > 11.5   < > 11.5   < > 11.3   < > 10.2*   < > 12.8   < > 12.3   Hematocrit 35  --  36  --  37  --  38  --  33*  --  43   < > 41   Platelets  --   --  220  --   --   --   --   --  210  --  249  --  281    < > = values in this interval not displayed.       Metabolic Panel:  Recent Labs   Lab 10/26/19  2319 10/26/19  0341 10/26/19  0042 10/25/19  2048   Sodium 140 142 137  --    Potassium 4.5 4.4 CANCELED  --    Chloride 103 107 103  --    CO2 30* 26 18*  --    UN 12 16 19   --    Creatinine 0.66 0.77 0.84 0.98*   Glucose 142* 144* 142*  --    Calcium 8.7 8.3* 9.2  --    Magnesium 2.4 1.8 2.1  --    Phosphorus 3.3 3.6 4.3  --           Assessment:    74 y.o. female L2T fall from 15 stairs 6/15    Type III odontoid fx  Scalp lac   Periaortic fat stranding   ?lumbar vein extrav  Splenic lac,   ?Pancreatic injury,   L 2, 5-6 rib fx   Small extrapleural hematoma      Plan:    - regular diet  - Appreciate APS assistance for pain control  - Pulm toilet, IS  - SCDs, Lovenox  - Awaiting C spine MRI  - Appreciate BTICU care      Rica Records, MD  Surgery Resident

## 2019-10-27 NOTE — Provider Consult (Addendum)
General Consult H&P for Inpatients    Consult Requested by: [name]   Consult Question: pain control    Chief Complaint: left sided rib pain    History of Present Illness:  [name] s/p fall w/ multiple injuries including left posterior 2, 5, 6 rib fractures.    Past Medical History:   Diagnosis Date    Allergic rhinitis 09/30/2014    Arthritis     Asthma     CAD (coronary artery disease)     DVT (deep venous thrombosis)     post-op after CABG, on anticoagulation for ~2 years    PE (pulmonary embolism)     post-op after CABG, on anticoagulation for ~2 years    Prediabetes     Tubular adenoma 03/2015    colonoscopy      Past Surgical History:   Procedure Laterality Date    ACHILLES TENDON SURGERY      APPENDECTOMY      CHOLECYSTECTOMY      CORONARY ANGIOPLASTY WITH STENT PLACEMENT      CORONARY ARTERY BYPASS GRAFT  2013    triple     CYST REMOVAL      both thumbs    PR REPAIR EYE BLOWOUT,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    Procedure: ORBIT ORIF;  Surgeon: [name], DDS;  Location: [facility] MAIN OR;  Service: OMFS    TONSILLECTOMY AND ADENOIDECTOMY       Family History   Problem Relation Age of Onset    Other Mother         "rare lung disease"    Aneurysm Mother         abdominal aortic aneurysm    Diabetes Type II Mother     Breast cancer Mother     Blood clots Mother     Stroke Father     Stroke Brother         x2    Thyroid cancer Daughter      Social History     Socioeconomic History    Marital status: Widowed     Spouse name: Not on file    Number of children: Not on file    Years of education: Not on file    Highest education level: Not on file   Tobacco Use    Smoking status: Never Smoker    Smokeless tobacco: Never Used   Substance and Sexual Activity    Alcohol use: No    Drug use: No    Sexual activity: Not on file   Other Topics Concern    Not on file   Social History Narrative    May 2016        Lives with her husband in an in law suite at her son's house        Three  children, all live locally     Five grandchildren     Retired from school food service       Allergies:   Allergies   Allergen Reactions    Morphine Hives     Has tolerated Percocet       Prior to Admission Medications:  Medications Prior to Admission   Medication Sig    atorvastatin (LIPITOR) 40 MG tablet TAKE ONE TABLET BY MOUTH ONCE DAILY    budesonide (PULMICORT) 0.25 MG/2ML nebulizer solution Inhale 0.25 mg into the lungs daily    budesonide-formoterol (SYMBICORT) 160-4.5 MCG/ACT inhaler Inhale 2 puffs into the lungs 2 times daily    albuterol HFA (  PROVENTIL, VENTOLIN, PROAIR HFA) 108 (90 Base) MCG/ACT inhaler Inhale 2 puffs into the lungs every 6 hours as needed    montelukast (SINGULAIR) 10 MG tablet Take 1 tablet (10 mg total) by mouth nightly    sertraline (ZOLOFT) 100 MG tablet Take 1 tablet (100 mg total) by mouth daily Replacing 50mg  dose, please cancel other refills    fluticasone-umeclidinium-vilanterol (TRELEGY ELLIPTA) 200-62.5-25 MCG/INH diskus inhaler Inhale 1 puff into the lungs daily    albuterol HFA (PROVENTIL, VENTOLIN, PROAIR HFA) 108 (90 Base) MCG/ACT inhaler Inhale 2 puffs into the lungs every 4-6 hours as needed    gabapentin (GABAPENTIN) 300 MG capsule Take 1 capsule (300 mg total) by mouth 3 times daily    pantoprazole (PROTONIX) 20 MG EC tablet Take 1 tablet (20 mg total) by mouth daily 30 min before breakfast Swallow whole. Do not crush, break, or chew.    azelastine (ASTELIN) 0.1 % nasal spray 1-2 sprays by Nasal route 2 times daily    Spacer/Aero-Holding Chambers (EASIVENT) spacer Use twice daily with Symbicort inhaler. Breathe in slowly.    fluticasone (FLONASE) 50 MCG/ACT nasal spray 2 sprays by Nasal route daily    Non-System Medication Medication/Supply: R thumb spica brace  Directions for Use: wear nightly    ipratropium-albuterol (DUONEB) 0.5-2.5mg  /11mL nebulizer solution Take 3 mLs by nebulization every 6 hours    ipratropium (ATROVENT) 0.06 % nasal spray 2  sprays by Each Nare route 3 times daily    Misc. Devices (CPAP) machine Download and compliance report    atorvastatin (LIPITOR) 40 MG tablet Take 40 mg by mouth daily (with dinner)    Cholecalciferol (VITAMIN D3) 5000 UNITS TABS Take 5,000 Units by mouth every morning       docusate sodium (COLACE) 100 MG capsule Take 200 mg by mouth every morning       clopidogrel (PLAVIX) 75 MG tablet Take 75 mg by mouth every morning          Active Hospital Medications:  Current Facility-Administered Medications   Medication Dose Route Frequency    ondansetron (ZOFRAN) injection 4 mg  4 mg Intravenous Q6H PRN    sodium chloride 0.9 % flush 10 mL  10 mL Intravenous PRN    And    sodium chloride 0.9 % flush 20 mL  20 mL Intravenous PRN    HYDROmorphone PF (DILAUDID) injection 0.5 mg  0.5 mg Intravenous Q2H PRN    perflutren protein A microsph (OPTISON) injection 1.5 mL  1.5 mL Intravenous PRN    budesonide (PULMICORT) nebulizer solution 0.5 mg  0.5 mg Nebulization 2 times per day    albuterol (PROVENTIL) nebulization 2.5 mg  2.5 mg Nebulization Q6H PRN    bacitracin zinc-polymyxin B sulfate (POLYSPORIN) ointment   Topical 2 times per day    pantoprazole (PROTONIX) EC tablet 20 mg  20 mg Oral QAM    sertraline (ZOLOFT) tablet 100 mg  100 mg Oral Daily    oxyCODONE (ROXICODONE) IR tablet 5 mg  5 mg Oral Q4H PRN    oxyCODONE (ROXICODONE) IR tablet 10 mg  10 mg Oral Q4H PRN    senna (SENOKOT) tablet 2 tablet  2 tablet Oral 2 times per day    polyethylene glycol (GLYCOLAX,MIRALAX) powder 17 g  17 g Oral Daily    bisacodyl (DULCOLAX) suppository 10 mg  10 mg Rectal Daily PRN    Bupivacaine 0.125% in sodium chloride 0.9% 250 mL  10 mL/hr via Peripheral Nerve Catheter Continuous  ipratropium-albuterol (DUONEB) 0.5-2.5mg  /29mL nebulization solution 3 mL  3 mL Nebulization Q6H    sodium chloride 0.9 % FLUSH REQUIRED IF PATIENT HAS IV  0-500 mL/hr Intravenous PRN    dextrose 5 % FLUSH REQUIRED IF PATIENT HAS IV   0-500 mL/hr Intravenous PRN    electrolyte (PLASMALYTE) solution  100 mL/hr Intravenous Continuous       Review of Systems:  Review of Systems   Constitutional: Negative for chills and fever.   Respiratory: Positive for shortness of breath.    Cardiovascular: Positive for chest pain (left posterior and lateral chest wall pain).   Gastrointestinal: Positive for heartburn and nausea.   Genitourinary: Positive for flank pain.   Musculoskeletal: Positive for back pain, falls and neck pain.   Neurological: Positive for headaches.   Endo/Heme/Allergies: Bruises/bleeds easily (on plavix- has not taken for several days).   Psychiatric/Behavioral: Positive for depression.       Last Nursing documented pain:  0-10 Scale: 2 (10/27/19 1000)  NVPS (Critical Care) - Score: 0 (10/26/19 2100)      Patient Vitals for the past 24 hrs:   BP Temp Temp src Pulse Resp SpO2   10/27/19 1000 118/52 36.6 C (97.9 F) FOLEY 66 12 96 %   10/27/19 0900 (!) 102/48 36.6 C (97.9 F) FOLEY 68 12 93 %   10/27/19 0800 139/62 36.6 C (97.9 F) FOLEY 68 14 95 %   10/27/19 0700 133/61 36.6 C (97.9 F) FOLEY 68 13 94 %   10/27/19 0600 139/60 36.5 C (97.7 F) FOLEY 66 12 95 %   10/27/19 0500 144/62 36.5 C (97.7 F) FOLEY 67 13 95 %   10/27/19 0400 149/55 36.5 C (97.7 F) FOLEY 67 12 96 %   10/27/19 0300 151/63 36.6 C (97.9 F) FOLEY 67 15 95 %   10/27/19 0256      95 %   10/27/19 0250      (!) 89 %   10/27/19 0200 155/68 36.7 C (98.1 F) FOLEY 68 15 91 %   10/27/19 0100 135/64 36.7 C (98.1 F) FOLEY 70 13 98 %   10/27/19 0000 139/57 36.8 C (98.2 F) FOLEY 73 15 99 %   10/26/19 2300 137/61 36.8 C (98.2 F) FOLEY 69 15 99 %   10/26/19 2200 130/53 36.6 C (97.9 F) FOLEY 70 14 99 %   10/26/19 2100 120/54 36.5 C (97.7 F) FOLEY 76 19 97 %   10/26/19 2000 126/59 36.5 C (97.7 F) FOLEY 68 13 99 %   10/26/19 1900 130/58 36.3 C (97.3 F) FOLEY 68 12 98 %   10/26/19 1800 120/55 36.2 C (97.2 F) FOLEY 69 11 99 %   10/26/19 1733     13     10/26/19 1700 123/53 36.3 C (97.3 F) FOLEY 72 11 100 %   10/26/19 1600 134/62 36.5 C (97.7 F) FOLEY 77 17 99 %   10/26/19 1500 (!) 85/42 36.2 C (97.2 F) FOLEY 83 14 97 %   10/26/19 1431  36.2 C (97.2 F) FOLEY 83 14 98 %   10/26/19 1400 (!) 89/43 36.2 C (97.2 F) FOLEY 79 15 98 %   10/26/19 1300 (!) 92/46 36 C (96.8 F) FOLEY 72 13 97 %   10/26/19 1200 (!) 122/49 35.8 C (96.4 F) FOLEY 72 15 99 %   10/26/19 1100 148/68 35.8 C (96.4 F) FOLEY 74 12 97 %     O2 Flow Rate: 4 L/min (10/27/19  1000)     Physical Examination:  Physical Exam  Constitutional:       Appearance: Normal appearance. She is obese.   HENT:      Head:      Comments: Neck in c-collar, numerous bumps and scrapes     Mouth/Throat:      Mouth: Mucous membranes are moist.   Eyes:      Extraocular Movements: Extraocular movements intact.   Cardiovascular:      Rate and Rhythm: Normal rate and regular rhythm.   Pulmonary:      Comments: Shallow breathing, pain with inspiration  Chest:      Chest wall: Tenderness present.   Musculoskeletal:         General: Tenderness (tenderness along left paraspinal mid thoracic out towards the mid clavicular line) present.   Skin:     General: Skin is warm and dry.   Neurological:      General: No focal deficit present.   Psychiatric:         Mood and Affect: Mood normal.         Behavior: Behavior normal.           Lab Results:   All labs in the last 24 hours:   Recent Results (from the past 24 hour(s))   Echo Complete    Collection Time: 10/26/19 10:45 AM   Result Value Ref Range    BSA 2.38 m2    Height 70 in    BMI 36.5 kg/m2    Weight 4,059.99 oz    Weight (lbs) 253.75 lbs    LV Septal Thickness 1.04 cm    LVED Diameter 4.80 cm    LVOT Diameter 1.89 cm    LV Posterior Wall Thickness 1.06 cm    LVOT PWD Velocity (peak) 89.0 cm/s    LVOT PWD Velocity (mean) 63.6 cm/s    LVOT PWD VTI 23.22 cm    LA Diameter 3.9 cm    Mean Gradient 29.06 mmHg    AV CWD Velocity (Peak) 344.9 cm/s    AV CWD Velocity (Mean)  255.3 cm/s    AV CWD VTI 81.1 cm    Peak Gradient - TR 23.5 mmHg    Peak Velocity - TR 242.13 cm/s    Aortic Diameter (mid tubular) 3.9 cm    Aortic Diameter (sinus of Valsalva) 3.4 cm    Heart Rate 74 bpm    RV Peak Systolic Pressure 32.2 mmHg    LVED Diameter BSA Index 2.02 cm/m2    LVED Diameter Height Index 2.70 cm/m    LVOT Area (calculated) 2.80 cm2    LVOT Stroke Volume 65.11 cc    LVOT SV BSA Index 27.36 mL/m2    LVOT SV Height Index 36.6 mL/m    LVOT Cardiac Output 4.82 L/min    LVOT Cardiac Index 2.02 L/min/m2    LVOT Stroke Rate (peak) 249.6 mL/s    LVOT Stroke Rate (mean) 178.3 mL/s    LV ASE Mass 181.9 gm    LV ASE Mass BSA Index 76.4 gm/m2    LV ASE Mass Height Index 102.3 gm/m    LV ASE Mass Height 2.7 Index 38.5 gm/m2.7    LA Diameter BSA Index 1.6 cm/m2    LA Diameter Height Index 2.2 cm/m    LVOT + AV Gradient (peak) 47.6 mmHg    AV Gradient (peak) 44.4 mmHg    LVOT/AV Velocity Ratio 0.26     AV Area (LVOT SV  Mtd) 0.80 cm2    AV Area (LVOT SR Mtd) 0.72 cm2    RA Pressure Estimate 10 mmHg    RR Interval 621.30 ms    BP Systolic 865 mmHg    BP Diastolic 68 mmHg    LV wall/cavity ratio 0.44     EF 70 %   HCT and Hgb    Collection Time: 10/26/19  1:08 PM   Result Value Ref Range    Hemoglobin 11.5 11.2 - 15.7 g/dL    Hematocrit 37 34.00 - 45.00 %   MCHC    Collection Time: 10/26/19  1:08 PM   Result Value Ref Range    MCHC 31 (L) 32 - 36 g/dL   Amylase    Collection Time: 10/26/19 11:19 PM   Result Value Ref Range    Amylase 27 (L) 28 - 100 U/L   Lipase    Collection Time: 10/26/19 11:19 PM   Result Value Ref Range    Lipase 12 (L) 13 - 60 U/L   Phosphorus    Collection Time: 10/26/19 11:19 PM   Result Value Ref Range    Phosphorus 3.3 2.7 - 4.5 mg/dL   Magnesium    Collection Time: 10/26/19 11:19 PM   Result Value Ref Range    Magnesium 2.4 1.6 - 2.5 mg/dL   Basic metabolic panel    Collection Time: 10/26/19 11:19 PM   Result Value Ref Range    Glucose 142 (H) 60 - 99 mg/dL    Sodium 140 133 - 145  mmol/L    Potassium 4.5 3.3 - 5.1 mmol/L    Chloride 103 96 - 108 mmol/L    CO2 30 (H) 20 - 28 mmol/L    Anion Gap 7 7 - 16    UN 12 6 - 20 mg/dL    Creatinine 0.66 0.51 - 0.95 mg/dL    GFR,Caucasian 88 *    GFR,Black 101 *    Calcium 8.7 8.6 - 10.2 mg/dL   CBC and differential    Collection Time: 10/26/19 11:19 PM   Result Value Ref Range    Hemoglobin 11.5 11.2 - 15.7 g/dL    Hematocrit 36 34.00 - 45.00 %    MCHC 32 32 - 36 g/dL    WBC 14.6 (H) 4.0 - 10.0 THOU/uL    RBC 3.9 3.90 - 5.20 MIL/uL    MCV 92 79.0 - 95.0 fL    MCH 29 26 - 32 pg    RDW 13.5 11.7 - 14.4 %    Platelets 220 160 - 370 THOU/uL    Seg Neut % 91.6 %    Lymphocyte % 3.3 %    Monocyte % 4.3 %    Eosinophil % 0.0 %    Basophil % 0.3 %    Neut # K/uL 13.3 (H) 1.6 - 6.1 THOU/uL    Lymph # K/uL 0.5 (L) 1.2 - 3.7 THOU/uL    Mono # K/uL 0.6 0.2 - 0.9 THOU/uL    Eos # K/uL 0.0 0.0 - 0.4 THOU/uL    Baso # K/uL 0.0 0.0 - 0.1 THOU/uL    Nucl RBC % 0.0 0.0 - 0.2 /100 WBC    Nucl RBC # K/uL 0.0 0.0 - 0.0 THOU/uL    IMM Granulocytes # 0.1 (H) 0.0 - 0.0 THOU/uL    IMM Granulocytes 0.5 %       Radiology impressions (last 3 days):  EKG 12 lead    Result Date:  10/26/2019  Sinus rhythm Prolonged PR interval Nonspecific T abnormalities, diffuse leads    CT angio chest    Addendum Date: 10/26/2019    -------- ADDENDUM #2 -------- Findings discussed by phone with Dr. Melene Muller from vascular surgery 10/26/2019 at approximately 6:25 AM. Follow-up is deferred to the vascular surgery service. [facility] submits this DICOM format image data and final report to the [facility], an independent secure electronic health information exchange, on a reciprocally searchable basis (with patient authorization) for a minimum of 12 months after exam date.-------- ADDENDUM #1 -------- While overall there is less periaortic hematoma, much of the decrease is seen more inferiorly; this hematoma appears slightly increased adjacent to some atelectatic lung on [id number] which may be due to  redistribution. No contrast extravasation. Vascular surgery consultation would be helpful. Would recommend reimaging with CTA at 48 hours from this exam, followed by CTA in one month if findings are stable or improving. Discussed by phone with Glorianne Manchester 6:10 AM 10/26/2019. [facility] submits this DICOM format image data and final report to the [facility], an independent secure electronic health information exchange, on a reciprocally searchable basis (with patient authorization) for a minimum of 12 months after exam date.    Addendum Date: 10/26/2019    -------- ADDENDUM #1 -------- While overall there is less periaortic hematoma, much of the decrease is seen more inferiorly; this hematoma appears slightly increased adjacent to some atelectatic lung on 501-243 which may be due to redistribution. No contrast extravasation. Vascular surgery consultation would be helpful. Would recommend reimaging with CTA at 48 hours from this exam, followed by CTA in one month if findings are stable or improving. Discussed by phone with Glorianne Manchester 6:10 AM 10/26/2019. [facility] submits this DICOM format image data and final report to the [facility], an independent secure electronic health information exchange, on a reciprocally searchable basis (with patient authorization) for a minimum of 12 months after exam date.    Result Date: 10/26/2019  There is a 5 mm linear filling defect in the aortic arch posterior medially just distal to the left subclavian artery origin without contour deformity or surrounding hematoma, compatible with intimal injury. This is categorized as minimal aortic injury which is amenable to nonoperative or medical management. Generally, suggested follow-up CTA is at 48 to 72 hours, then repeat CTA at 1 month. This finding has not changed significantly in retrospect compared to 10/25/2019 at 9:02 PM. Note that these injuries are associated with periaortic hematomas. Interval slight decrease in size of the  periaortic hematoma involving the mid to lower thoracic and upper abdominal aorta. Aorta at these levels appears normal. No contrast extravasation. Left-sided rib fractures with small extra pleural hematoma. Small focal areas of groundglass opacities in the left lung apex compatible with pulmonary contusion, not significantly changed. END OF IMPRESSION I have personally reviewed the images and the Resident's/Fellow's interpretation and agree with or edited the findings. [facility] submits this DICOM format image data and final report to the [facility], an independent secure electronic health information exchange, on a reciprocally searchable basis (with patient authorization) for a minimum of 12 months after exam date.    CT angio neck carotid    Result Date: 10/25/2019  No evidence of acute carotid or vertebral artery dissection in the neck. Severe left and moderate-to-severe right internal carotid narrowing just distal to the bifurcation likely due to chronic atherosclerotic changes with greater than 70% stenosis bilaterally. END OF IMPRESSION I have  personally reviewed the images and the Resident's/Fellow's interpretation and agree with or edited the findings. [facility] submits this DICOM format image data and final report to the [facility], an independent secure electronic health information exchange, on a reciprocally searchable basis (with patient authorization) for a minimum of 12 months after exam date.    CT abdomen and pelvis with IV contrast    Result Date: 10/25/2019  There is fat stranding and possible extravasation from the left lumbar vein and thickening of the medial crus of the left hemidiaphragm. Adjacent to this site, there is a moderate degree of fat stranding around the aorta. This stranding is likely an extension of the injury to the left lumbar vein with venous bleeding. However, an aortic injury cannot be entirely ruled out. Small splenic laceration and perisplenic hematoma, consistent  with a low grade splenic injury. 3 cm hematoma adjacent to the pancreatic tail may represent a low grade pancreatic tail injury. Mild endometrial thickening/low-attenuation material in the endometrial canal, more than expected for age. Recommend further evaluation with pelvic ultrasound on a nonemergent basis. Significant findings were conveyed to Dr. Marijean Bravo via phone by Dr. Oleta Mouse at 10:03 PM on 10/25/2019. END OF IMPRESSION I have personally reviewed the images and the Resident's/Fellow's interpretation and agree with or edited the findings. [facility] submits this DICOM format image data and final report to the [facility], an independent secure electronic health information exchange, on a reciprocally searchable basis (with patient authorization) for a minimum of 12 months after exam date.    CT chest with IV contrast    Result Date: 10/26/2019  Mildly displaced left posterior second rib fracture with small adjacent extrapleural hematoma. There are additional nondisplaced fractures of the posterior left fifth and sixth ribs. Few small areas of groundglass opacities within the left lung apex, likely representing pulmonary contusion/hemorrhage. There is soft tissue stranding around the mid descending thoracic aorta with extension to the level of the abdominal aorta. This may represent small hematoma formation. No active extravasation is visualized. This can be from a venous injury or adjacent focal diaphragmatic injury. If clinically indicated, a repeat CT can be obtained in 12-24 hours with delayed phase images to evaluate for any persistent venous or occult aortic injury. No contour abnormality in thoracic aorta. No aortic pseudoaneurysm formation. Please see separately dictated CT abdomen and pelvis report for findings below the diaphragm and CT thoracic spine report for additional details. END OF IMPRESSION I have personally reviewed the images and the Resident's/Fellow's interpretation and agree with or  edited the findings. [facility] submits this DICOM format image data and final report to the [facility], an independent secure electronic health information exchange, on a reciprocally searchable basis (with patient authorization) for a minimum of 12 months after exam date.    CT maxillofacial without contrast    Result Date: 10/25/2019  No acute fracture of the maxillofacial skeleton. Partially visualized type III dens fracture. Please refer to separately dictated CT cervical spine for further description.. END OF IMPRESSION [facility] submits this DICOM format image data and final report to the [facility], an independent secure electronic health information exchange, on a reciprocally searchable basis (with patient authorization) for a minimum of 12 months after exam date.    CT head without contrast for TRAUMA    Result Date: 10/25/2019  No acute intracranial abnormality. No acute intracranial hemorrhage. No displaced calvarial fracture. Partly visualized likely type III odontoid fracture. Please see separately dictated CT  cervical spine for further evaluation. Large approximate 9 cm hematoma at the right frontal scalp and a large laceration to the left parietal scalp as described above. Please see additional separately dictated CT reports for further evaluation Findings discussed with covering provider Dr. Ernie Hew via phone at 9:20 PM on 10/25/2019. END OF IMPRESSION I have personally reviewed the images and the Resident's/Fellow's interpretation and agree with or edited the findings. [facility] submits this DICOM format image data and final report to the [facility], an independent secure electronic health information exchange, on a reciprocally searchable basis (with patient authorization) for a minimum of 12 months after exam date.    Chest single frontal view    Result Date: 10/25/2019  Mildly displaced left upper rib fractures, involving at least the second rib. Small adjacent extrapleural hematoma  Hypoexpanded lungs. END OF IMPRESSION I have personally reviewed the images and the Resident's/Fellow's interpretation and agree with or edited the findings. [facility] submits this DICOM format image data and final report to the [facility], an independent secure electronic health information exchange, on a reciprocally searchable basis (with patient authorization) for a minimum of 12 months after exam date.    CT lumbar spine reprocess from CT    Result Date: 10/25/2019  No acute lumbosacral fracture. Please see the concurrent CT abdomen and pelvis report for a complete discussion of intra-abdominal findings. END OF IMPRESSION I have personally reviewed the images and the Resident's/Fellow's interpretation and agree with or edited the findings. [facility] submits this DICOM format image data and final report to the [facility], an independent secure electronic health information exchange, on a reciprocally searchable basis (with patient authorization) for a minimum of 12 months after exam date.    CT thoracic spine reprocess from CT    Result Date: 10/25/2019  Motion artifact degrades image quality and limits sensitivity of the examination. No acute thoracic spine fracture. END OF IMPRESSION [facility] submits this DICOM format image data and final report to the [facility], an independent secure electronic health information exchange, on a reciprocally searchable basis (with patient authorization) for a minimum of 12 months after exam date.    CT angio abdomen and pelvis    Result Date: 10/26/2019  Stable fat stranding/hematoma about the upper abdominal aorta. There is increased hematoma in the left retroperitoneum and abutting the psoas muscle, but this does not appear to be associated with the abdominal aorta and may be associated with venous bleed. Discussed with Dr. Virl Cagey from vascular surgery by phone 6:25 AM 10/26/2019. There is no evidence of extravasation of contrast from the abdominal aorta . No  direct injury seen to the abdominal aorta. There is some wall thickening of the proximal to mid ureter without contrast extravasation, suspicious for possible mural injury to the ureter. There is mild associated left hydronephrosis. Consider short interval follow-up ultrasound rule out increasing  hydronephrosis. Stable 3 cm hematoma adjacent pancreatic tail. Follow-up of lipase/amylase would be helpful to rule out any posttraumatic pancreatitis involving the pancreatic tail. Small hematoma along left lower abdominal wall laterally. Small amount of blood now in the left anterior pelvis just deep to the ventral abdominal wall. No active bleeding visualized. END OF IMPRESSION I have personally reviewed the images and the Resident's/Fellow's interpretation and agree with or edited the findings. [facility] submits this DICOM format image data and final report to the [facility], an independent secure electronic health information exchange, on a reciprocally searchable basis (with patient authorization)  for a minimum of 12 months after exam date.    CT Cervical Spine Reprocess from CT    Result Date: 10/25/2019  Acute mildly displaced type III odontoid fracture without narrowing of the central spinal canal. Please see additional separately dictated CT reports. Findings discussed with covering provider Dr. Ernie Hew via phone at 9:20 PM on 10/25/2019. END OF IMPRESSION I have personally reviewed the images and the Resident's/Fellow's interpretation and agree with or edited the findings. [facility] submits this DICOM format image data and final report to the [facility], an independent secure electronic health information exchange, on a reciprocally searchable basis (with patient authorization) for a minimum of 12 months after exam date.        Currently Active/Followed Hospital Problems:  Active Hospital Problems    Diagnosis     *!*Fall from 15 stairs 6/15 type III odontoid fx, scalp lac, ?lumbar vein extrav, periaortic  fat stranding, splenic lac, ?pancreatic injury, L 2, 5-6 rib fx, small extrapleural hematoma        Assessment: 52F w/ numerous injuries including left posterior 2, 5, 6 rib fractures for which we placed a left sided T5 erector spinae catheter 6/17, which is working well. O2 weaned down to 2L NC. IS of 500 but weak effort. Pain scores ranging from ~2-6/10 with appropriate narcotic usage. Has been off IV narcotics for 12 hrs now. 0.25% bupivicaine bolused at bedside this morning.     THIS IS A PERIPHERAL CATHETER, NOT AN EPIDURAL. THIS CATHETER DOES NOT CONTAIN NARCOTIC MEDICATIONS, WILL NOT AFFECT BLOOD PRESSURE, AND IS NOT A CONTRAINDICATION TO PROPHYLACTIC ANTICOAGULATION.    Plan:  Left thoracic erector spinae catheter:  - working well for controlling her rib fracture pain.  - continue bupivicaine 0.125% at 72ml/hr w/ 91ml prn q61m  - daily site checks    Recommendations:  - continue acetaminophen  - restart home gabapentin  - continue oxycodone prn or equivalent  - bowel regimen while taking narcotics  Pain Consult  LOS: [id number]     Responsible Anesthesia Attestation:  I saw and evaluated the patient. I agree with the resident's/fellow's findings and plan of care as documented above.   Tommi Emery, MD  10/27/19, 3:02 PM            Author: Shelda Altes, MD  Note created: 10/27/2019  at: 10:14 AM

## 2019-10-27 NOTE — Progress Notes (Signed)
BTICU Progress Note     LOS: 3 days     Interval History:   - Still complaining of significant pain on the left side  - Awaiting C-spine MRI but unable to complete with left thoracic erector spinae catheter in     Objective Section:  Temp:  [36.4 C (97.5 F)-37.2 C (98.9 F)] 36.8 C (98.2 F)  Heart Rate:  [62-78] 75  Resp:  [11-17] 16  BP: (90-154)/(41-72) 150/72  Arterial Line BP: (121-183)/(50-76) 183/76    Physical Exam by Systems:  General: resting comfortably, in NAD   Heart: RRR, s1s2, no murmurs appreciated   Lungs: clear to auscultation bilaterally   Abdomen: soft, NT/ND, hypoactive bowel sounds   Ext: WWP, no edema appreciated   Neuro: alert, oriented, following commands, no focal deficits noted     Imaging findings last 48 hours:   EKG 12 lead    Result Date: 10/26/2019  Sinus rhythm Prolonged PR interval Nonspecific T abnormalities, diffuse leads    CT angio chest    Addendum Date: 10/26/2019    -------- ADDENDUM #2 -------- Findings discussed by phone with Dr. Melene Muller from vascular surgery 10/26/2019 at approximately 6:25 AM. Follow-up is deferred to the vascular surgery service. [facility] submits this DICOM format image data and final report to the [facility], an independent secure electronic health information exchange, on a reciprocally searchable basis (with patient authorization) for a minimum of 12 months after exam date.-------- ADDENDUM #1 -------- While overall there is less periaortic hematoma, much of the decrease is seen more inferiorly; this hematoma appears slightly increased adjacent to some atelectatic lung on 501-243 which may be due to redistribution. No contrast extravasation. Vascular surgery consultation would be helpful. Would recommend reimaging with CTA at 48 hours from this exam, followed by CTA in one month if findings are stable or improving. Discussed by phone with Glorianne Manchester 6:10 AM 10/26/2019. [facility] submits this DICOM format image data and final report to the  [facility], an independent secure electronic health information exchange, on a reciprocally searchable basis (with patient authorization) for a minimum of 12 months after exam date.    Addendum Date: 10/26/2019    -------- ADDENDUM #1 -------- While overall there is less periaortic hematoma, much of the decrease is seen more inferiorly; this hematoma appears slightly increased adjacent to some atelectatic lung on 501-243 which may be due to redistribution. No contrast extravasation. Vascular surgery consultation would be helpful. Would recommend reimaging with CTA at 48 hours from this exam, followed by CTA in one month if findings are stable or improving. Discussed by phone with Glorianne Manchester 6:10 AM 10/26/2019. [facility] submits this DICOM format image data and final report to the [facility], an independent secure electronic health information exchange, on a reciprocally searchable basis (with patient authorization) for a minimum of 12 months after exam date.    Result Date: 10/26/2019  There is a 5 mm linear filling defect in the aortic arch posterior medially just distal to the left subclavian artery origin without contour deformity or surrounding hematoma, compatible with intimal injury. This is categorized as minimal aortic injury which is amenable to nonoperative or medical management. Generally, suggested follow-up CTA is at 48 to 72 hours, then repeat CTA at 1 month. This finding has not changed significantly in retrospect compared to 10/25/2019 at 9:02 PM. Note that these injuries are associated with periaortic hematomas. Interval slight decrease in size of the periaortic hematoma involving the mid  to lower thoracic and upper abdominal aorta. Aorta at these levels appears normal. No contrast extravasation. Left-sided rib fractures with small extra pleural hematoma. Small focal areas of groundglass opacities in the left lung apex compatible with pulmonary contusion, not significantly changed. END OF  IMPRESSION I have personally reviewed the images and the Resident's/Fellow's interpretation and agree with or edited the findings. [facility] submits this DICOM format image data and final report to the [facility], an independent secure electronic health information exchange, on a reciprocally searchable basis (with patient authorization) for a minimum of 12 months after exam date.    CT angio abdomen and pelvis    Result Date: 10/26/2019  Stable fat stranding/hematoma about the upper abdominal aorta. There is increased hematoma in the left retroperitoneum and abutting the psoas muscle, but this does not appear to be associated with the abdominal aorta and may be associated with venous bleed. Discussed with Dr. Virl Cagey from vascular surgery by phone 6:25 AM 10/26/2019. There is no evidence of extravasation of contrast from the abdominal aorta . No direct injury seen to the abdominal aorta. There is some wall thickening of the proximal to mid ureter without contrast extravasation, suspicious for possible mural injury to the ureter. There is mild associated left hydronephrosis. Consider short interval follow-up ultrasound rule out increasing  hydronephrosis. Stable 3 cm hematoma adjacent pancreatic tail. Follow-up of lipase/amylase would be helpful to rule out any posttraumatic pancreatitis involving the pancreatic tail. Small hematoma along left lower abdominal wall laterally. Small amount of blood now in the left anterior pelvis just deep to the ventral abdominal wall. No active bleeding visualized. END OF IMPRESSION I have personally reviewed the images and the Resident's/Fellow's interpretation and agree with or edited the findings. [facility] submits this DICOM format image data and final report to the [facility], an independent secure electronic health information exchange, on a reciprocally searchable basis (with patient authorization) for a minimum of 12 months after exam date.      Routine Medications:     sodium phosphate IV  15 mmol Intravenous Once    bisacodyl  10 mg Rectal Daily    polyethylene glycol  17 g Oral 2 times per day    enoxaparin  30 mg Subcutaneous 2 times per day    acetaminophen  1,000 mg Oral Q8H    budesonide  0.5 mg Nebulization 2 times per day    bacitracin zinc-polymyxin B sulfate   Topical 2 times per day    pantoprazole  20 mg Oral QAM    sertraline  100 mg Oral Daily    senna  2 tablet Oral 2 times per day    ipratropium-albuterol  3 mL Nebulization Q6H      Bupivacaine 0.125% in sodium chloride 0.9% 250 mL  10 mL/hr via Peripheral Nerve Catheter Continuous       PRN Medications:   oxyCODONE  5 mg Oral Q4H PRN    Or    oxyCODONE  10 mg Oral Q4H PRN    ondansetron  4 mg Intravenous Q6H PRN    sodium chloride  10 mL Intravenous PRN    And    sodium chloride  20 mL Intravenous PRN    albuterol  2.5 mg Nebulization Q6H PRN    sodium chloride  0-500 mL/hr Intravenous PRN    dextrose  0-500 mL/hr Intravenous PRN       Assessment: 74 y.o.femaleL2T fall from 15 stairs on 6/15    Type  III odontoid fx  Scalp lac   Periaortic fat stranding   ?lumbar vein extrav  Splenic lac,   ?Pancreatic injury,    L 2, 5-6 rib fx   Small extrapleural hematoma      Currently Active/Followed Hospital Problems:  Active Hospital Problems    Diagnosis     *!*Fall from 15 stairs 6/15 type III odontoid fx, scalp lac, ?lumbar vein extrav, periaortic fat stranding, splenic lac, ?pancreatic injury, L 2, 5-6 rib fx, small extrapleural hematoma        PLAN:    Pulm: Left rib fractures #2, 5, 6; Hx asthma     - Currently on 3L NC, wean as appropriate   - Pulmonary toilet   - Continue home albuterol, Pulmicort, Duonebs      ABG:  Recent Labs   Lab 10/25/19  2048   Lactate 1.4     Vent settings for last 24 hours:       CVS: Hemodynamically appropriate; h/o CAD s/p CABG and stent; concern for aortic injury, hypotension - improved, >70% bilateral ICA stenosis  - Vascular has signed off   - Holding home  atorvastatin and clopidogrel  - Hydralazine prn for SBP > 160  - Echo: normal wall motion, EF 70%  BP  Min: 90/42  Max: 154/64  Pulse  Avg: 70.5  Min: 62  Max: 78       Renal/E/F: Normal renal function  - Electrolytes replaced: Phos 2.3   - mIVF: Plasmalyte 100 cc/hr   - Incontinent    Intake and Output:     Intake/Output Summary (Last 24 hours) at 10/28/2019 0533  Last data filed at 10/28/2019 0459  Gross per 24 hour   Intake 1392.05 ml   Output 580 ml   Net 812.05 ml     Chem:  Recent Labs   Lab 10/27/19  2336 10/26/19  2319 10/26/19  2319 10/26/19  0341 10/26/19  0341   Sodium 137  --  140  --  142   Potassium 4.3   < > 4.5   < > 4.4   Chloride 100  --  103  --  107   CO2 31*  --  30*  --  26   UN 13  --  12  --  16   Creatinine 0.62  --  0.66  --  0.77   Glucose 113*  --  142*  --  144*   Calcium 8.4*  --  8.7  --  8.3*   Magnesium 2.1  --  2.4  --  1.8   Phosphorus 2.3*  --  3.3  --  3.6    < > = values in this interval not displayed.       Recent Labs   Lab 10/27/19  2336 10/26/19  2319   UN 13 12   Creatinine 0.62 0.66   Potassium 4.3 4.5       GI/Nutrition: Diet regular    - Bowel regimen: Senna / Miralax / Dulcolax   - Zofran prn   - No BMs, increased regimen    - Continue home Protonix   LFT's:  Recent Labs   Lab 10/26/19  2319 10/25/19  2048   Albumin  --  3.8   Total Protein  --  6.5   Bilirubin,Total  --  0.2   Bilirubin,Direct  --  <0.2   AST  --  57*   ALT  --  39*   Alk  Phos  --  157*   Amylase 27* 50   Lipase 12* 59       ID: afebrile, decreasing leukocytosis   - No current antibiotics   - s/p Ancef and Tdap in ED   - COVID negative 6/14  Temp (24hrs), Avg:36.7 C (98 F), Min:36.4 C (97.5 F), Max:37.2 C (98.9 F)      Recent Labs   Lab 10/27/19  2336 10/26/19  2319   WBC 14.1* 14.6*        Heme:  No acute issues; 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; on Plavix for cardiac stent; splenic lac   HCT 36 (35)   - Lovenox for DVT ppx     Heme:  Recent Labs   Lab 10/27/19  2336 10/27/19  1202 10/27/19  1202  10/26/19  2319 10/26/19  2319 10/26/19  0818 10/26/19  0341 10/26/19  0042 10/25/19  2048   WBC 14.1*  --   --   --  14.6*  --  15.0*   < > 18.6*   Hemoglobin 11.3   < > 10.6*   < > 11.5   < > 10.2*   < > 12.3   Hematocrit 36  --  35  --  36   < > 33*   < > 41   Platelets 229  --   --   --  220  --  210   < > 281   Protime  --   --   --   --   --   --   --   --  11.6   INR  --   --   --   --   --   --   --   --  1.0   aPTT  --   --   --   --   --   --   --   --  22.4*    < > = values in this interval not displayed.     Recent Labs   Lab 10/27/19  2336 10/27/19  1202 10/26/19  2319   Hematocrit 36   < > 36   Platelets 229  --  220    < > = values in this interval not displayed.         Endo: No active issue   - BG < 120    Neuro/pain: Acute pain management  - Tylenol ATC   - Oxy 5/10 q4h prn  - APS following, appreciate recs   - Left thoracic erector spinae catheter with bupivacaine at 10 cc/hr  - APS suggests to restart home gabapentin   Nursing pain score: Last Nursing documented pain:  0-10 Scale: 4 (10/28/19 0400)      Integument: scalp lac s/p washout and stapled closed in ED   - PRS consulted     Musculoskeletal: bedrest, right hand and left knee pain, type III odontoid fx  - Ortho consulted: f/up R hand and L knee XR  - NSGY consulted for type III odontoid fx: C-collar in place, MRI c-spine w/o contrast    Prophylaxis: Lovenox     Author: Levon Hedger, PA  as of: 10/28/2019  at: 5:33 AM

## 2019-10-27 NOTE — Telephone Encounter (Signed)
Sent mychart message-  We've scheduled you a Follow up with Dr. Dortha Kern at the [place] office for 7/29 at 1pm. If that appointment doesn't work for you, please call the office at [phone] to reschedule.   Dr. Dortha Kern would like you to get an Ultrasound prior to that appointment.  The number to call Imaging and schedule that is below.     [facility] - [phone]

## 2019-10-27 NOTE — Progress Notes (Signed)
Report Given To  Cuda, RN    Afebrile. Hypertensive SBPs 170-180s, suspected to be due to pain. Pt complaining of 8/10 pain in chest/abdomen that is aching/constant in nature. Given prn 0.5 dilaudid x 1 and 5 mg oxy with no therapeutic effect. Provider notified; pt given 1 time dose 0.25 dilaudid, 10 mg oxy and bupivicaine bolus on PCEA; SBP in 140s following administration and pt reported pain improved to 3-4/10. EKG completed due to chest pain, unremarkable. Desats to mid 80s on 2L NC, now on 4L , satting > 95%. Hct 36 (37). Slept poorly. Daughter called and updated. Adequate UOP per foley. Please refer to MAR/flowsheets for further information regarding patient care.     Neita Goodnight, RN        Descriptive Sentence / Reason for Admission   [name] is a 74 y.o. female w/pmhx CAD s/p CABG 2012 in [place] and recent stenting angio 2015 on plavix, COPD, OSA, DM who presented as level II Trauma after fall down 12 stairs at nephew's house during family party.     Injuries:     - right scalp hematoma  - left parietal scalp laceration  - type 3 odontoid fx  - left rib fracture  - splenic laceration    Vascular surgery consulted for assessment and management.         Active Issues / Relevant Events   615N: Admit to [place]. Hypotensive 60/30s with AMS; BP then 80/40s after 2 x 1 L LR fluid boluses given. Given 1 unit PRBC and 1 unit platelets. EKG with trops sent.  R fem triple lumen CVC placed and L fem a-line placed. STAT CTA head/chest/abdomen completed. COVID NEG.  6/15D: Echo done 1015 Erector Spinae Block placed by APS 1550  6/16N: Hypertensive SBPs 170-180s. Given prn 0.5 dilaudid x 1 and 5 mg oxy with no therapeutic effect. Provider notified; pt given 1 time dose 0.25 dilaudid, 10 mg oxy and bupivicaine bolus on PCEA. EKG completed.       To Do List  Q1 VS  Q1 I&O  Q4 Neuro checks  Clear Liquid   Q12 H/H  Daily labs     Hold home Gabapentin per providers    COVID NEG 6/14    MRI c-spine without  contrast be completed           Anticipatory Guidance / Discharge Planning  Transfer to floor when medically stable.    Foley Lot# [id number]  Daughter [name] took Gold ring and opal earrings home 6/15

## 2019-10-27 NOTE — Progress Notes (Signed)
Trauma & Acute Care Surgery Progress Note     LOS: 2 days     Subjective:  Seen by APS, L erector spinae catheter placed  Seen by Urology  NAEON    Objective:    Vitals Sign Ranges for Past 24 Hours:  BP: (64-148)/(33-75)   Temp:  [35.8 C (96.4 F)-37.3 C (99.1 F)]   Temp src: Foley (06/15 2300)  Heart Rate:  [67-88]   Resp:  [11-20]   SpO2:  [81 %-100 %]   Height:  [177.8 cm (5\' 10" )]   Weight:  [115.1 kg (253 lb 12 oz)]       Physical Exam:    General Appearance: NAD, sleeping comfortably  HEENT: C-Collar in place  Cardiac: RRR  Respiratory: nonlabored  Abdomen: obese, soft, nt  Extremities: WWP    Labs:    CBC:  Recent Labs   Lab 10/26/19  2319 10/26/19  1308 10/26/19  1308 10/26/19  0818 10/26/19  0818 10/26/19  0341 10/26/19  0341 10/26/19  0042 10/26/19  0042 10/25/19  2048 10/25/19  2048   WBC 14.6*  --   --   --   --   --  15.0*  --  19.5*  --  18.6*   Hemoglobin 11.5   < > 11.5   < > 11.3   < > 10.2*   < > 12.8   < > 12.3   Hematocrit 36  --  37  --  38  --  33*  --  43  --  41   Platelets 220  --   --   --   --   --  210  --  249  --  281    < > = values in this interval not displayed.       Metabolic Panel:  Recent Labs   Lab 10/26/19  2319 10/26/19  0341 10/26/19  0042 10/25/19  2048   Sodium 140 142 137  --    Potassium 4.5 4.4 CANCELED  --    Chloride 103 107 103  --    CO2 30* 26 18*  --    UN 12 16 19   --    Creatinine 0.66 0.77 0.84 0.98*   Glucose 142* 144* 142*  --    Calcium 8.7 8.3* 9.2  --    Magnesium 2.4 1.8 2.1  --    Phosphorus 3.3 3.6 4.3  --           Assessment:    74 y.o. female L2T fall from 15 stairs 6/15    Type III odontoid fx  Scalp lac   Periaortic fat stranding   ?lumbar vein extrav  Splenic lac,   ?Pancreatic injury,   L 2, 5-6 rib fx   Small extrapleural hematoma      Plan:    - CLD, IVF  - BID H/H, space out to daily if H/H stable  - Appreciate APS assistance  - Urology: No intervention, If repeat CT imaging with contrast obtained while inpatient, please get a delayed phase  of the abdomen to assess the urinary tract, but no need for further dedicated imaging at this time. Outpatient follow up with Renal US in 1 month.  - Pulm toilet, IS  - Foscoe Diago Haik, MD  Surgery Resident

## 2019-10-27 NOTE — Telephone Encounter (Signed)
Safe Transitions Review of Chart done today.  Risk Score %: 51   Patient is admitted to:   [facility]   [phone]      Being followed by:   Critical Care  Diagnosis:    Fall down 12 steps  Injuries:   - right scalp hematoma  - left parietal scalp laceration  - type 3 odontoid fx  - left rib fracture  - splenic laceration  Est. Discharge Date:   To Be Determined   I will continue to review patient's progress until discharge.  If at discharge PCP feels it is warranted, we can discuss Care Management needs at that time.   Time spent on chart review: 15 minutes    Encarnacion Chu, RN, [place]   [facility] [place]

## 2019-10-27 NOTE — Progress Notes (Signed)
Burn / Trauma ICU Attending Plan:    I have seen and evaluated the patient personally on rounds with the resident/Fellow/PA/NP ICU team. I personally examined the patient.  All nursing documentation, laboratory data, test results, and radiographs were reviewed and interpreted by me.I agree with the database, findings, and plan of care recorded in the above note. I have reviewed and edited the above note to reflect my findings as well.  I have established the management plan for this patient's critical illness with the ICU team and have been immediately available to assist with patient care. Additional details are highlighted are within my note below.     24hrs: erector spinae catheter placement.     Catalogue of Injuries    - right scalp hematoma  - left parietal scalp laceration  - type 3 odontoid fx  - left rib fracture  - splenic laceration    Respiratory: moderate supplemental oxygen needs. Asthma medication resume: albuterol, pulmicort, duonebs. Pulmonary supportive care with IS and wean as tolerated   Acute Pulmonary Insufficiency -- Yes   Acute Respiratory Failure: No  Ventilator dependent: No    CV: hemodynamically apppropriate. Adequate perfusion     Shock: No Dysrrythmia/Arrhythmia: No    GU/Fluids: HLIV. Adequate urine output > 1L. Stable BUN/Cr. Stable lytes. Discontinue foley catheter, with voiding trial.   Acute renal injury: No Chronic renal failure: No Electrolyte abnormality: No Need to keep foley? yes      GI/Nutrition: tolerating clear diet, advance as tolerated. Resume PPI home regimen. Continue current bowel regimen.   On GI prophylaxis: Yes, Moderate malnutrition: No     ID: -fever/ downtrending leukocytosis (14.6). Right TLC (6/15), plan to discontinue left groin arterial line (6/15)  SIRS: Yes Sepsis: No Severe Sepsis: No  Pneumonia: No  COVID Positive: No    Last COVID test date: 6/14    Heme: acute blood loss anemia (09,32,35), start chemoDVT prophylaxis regimen with lovenox. HOLD Plavix  until discharge. serial hct daily.  SCD's for mechanical prophylaxis  On DVT Prophylaxis: No: Acute blood loss anemia: No Coagulopathy: No     Endocrine: no current issues.    Hyperglycemia -- No  Insulin regimen -- No  Synthroid -- No    Neuro/Sedation: improved pain management with catheter based analgesia. Pain management:  po Tylenol + po oxycodone. Neurocognitive medication regimen: restart home Sertraline. Hold neuropathic home regimen.   Encephalopathy: No Coma: NoBrain compression: No    Exts/Consults/Other: ICU prophylaxis regimen  - APS evaluation and management  - neurospine evaluation and management.   - trauma sx evaluation and management. Tertiary evaluation pending  - PT/OT evaluation for dispo recommendations     Social/Family/Dispo: family update  Family present at bedside today?  No    Attending Attestation:    This is a moderate complexity patient.     [X]  This patient is critically ill with at least 1 organ system failure associated with a high probability of life threatening deterioration. The care I have delivered involved high complexity decision making to assess, manipulate, and support vital system function(s), to treat the vital organ system failure and/or prevent further deterioration of the patient's condition. All nursing documentation, laboratory data, test results, and radiographs were reviewed and interpreted by me during multidisciplinary rounds. I have established the management plan for this patient's critical illness and have been immediately available to assist with patient care. My documented total critical care time reflects my own patient care time and does not include teaching or  procedure time.     Admission diagnoses for this admission include:  Acute Blood Loss Anemia, Acute Pulmonary Insufficiency Following Surgery and Electrolyte Imbalance hypoMagnesium    Critical care time since midnight/this 24 hour period: Personal time spent - 20 minutes.  This critical care time is  exclusive of any time for separately billable procedures and time spent teaching    Signed by: Melton Krebs, MD  as of 10/27/2019 at 8:09 AM

## 2019-10-28 ENCOUNTER — Ambulatory Visit: Payer: Medicare (Managed Care) | Admitting: Primary Care

## 2019-10-28 ENCOUNTER — Inpatient Hospital Stay: Payer: Medicare (Managed Care)

## 2019-10-28 DIAGNOSIS — S12111A Posterior displaced Type II dens fracture, initial encounter for closed fracture: Secondary | ICD-10-CM

## 2019-10-28 LAB — EKG 12-LEAD
P: 43 deg
P: 29 deg
PR: 279 ms
PR: 210 ms
QRS: 41 deg
QRS: 33 deg
QRSD: 111 ms
QRSD: 100 ms
QT: 341 ms
QTc: 394 ms
QTc: 0 ms
Rate: 80 {beats}/min
Rate: 67 {beats}/min
T: 46 deg
T: 50 deg

## 2019-10-28 LAB — CBC AND DIFFERENTIAL
Baso # K/uL: 0.1 10*3/uL (ref 0.0–0.1)
Baso # K/uL: 0.1 10*3/uL (ref 0.0–0.1)
Basophil %: 0.3 %
Basophil %: 0.4 %
Eos # K/uL: 0 10*3/uL (ref 0.0–0.4)
Eos # K/uL: 0.1 10*3/uL (ref 0.0–0.4)
Eosinophil %: 0 %
Eosinophil %: 0.9 %
Hematocrit: 36 % (ref 34–45)
Hematocrit: 37 % (ref 34–45)
Hemoglobin: 11.3 g/dL (ref 11.2–15.7)
Hemoglobin: 11.4 g/dL (ref 11.2–15.7)
IMM Granulocytes #: 0.1 10*3/uL — ABNORMAL HIGH (ref 0.0–0.0)
IMM Granulocytes #: 0.1 10*3/uL — ABNORMAL HIGH (ref 0.0–0.0)
IMM Granulocytes: 0.6 %
IMM Granulocytes: 0.7 %
Lymph # K/uL: 0.5 10*3/uL — ABNORMAL LOW (ref 1.2–3.7)
Lymph # K/uL: 1 10*3/uL — ABNORMAL LOW (ref 1.2–3.7)
Lymphocyte %: 2.5 %
Lymphocyte %: 7.4 %
MCH: 29 pg (ref 26–32)
MCH: 29 pg (ref 26–32)
MCHC: 31 g/dL — ABNORMAL LOW (ref 32–36)
MCHC: 32 g/dL (ref 32–36)
MCV: 92 fL (ref 79–95)
MCV: 93 fL (ref 79–95)
Mono # K/uL: 1.2 10*3/uL — ABNORMAL HIGH (ref 0.2–0.9)
Mono # K/uL: 1.3 10*3/uL — ABNORMAL HIGH (ref 0.2–0.9)
Monocyte %: 6.9 %
Monocyte %: 9 %
Neut # K/uL: 11.5 10*3/uL — ABNORMAL HIGH (ref 1.6–6.1)
Neut # K/uL: 16 10*3/uL — ABNORMAL HIGH (ref 1.6–6.1)
Nucl RBC # K/uL: 0 10*3/uL (ref 0.0–0.0)
Nucl RBC # K/uL: 0 10*3/uL (ref 0.0–0.0)
Nucl RBC %: 0 /100 WBC (ref 0.0–0.2)
Nucl RBC %: 0 /100 WBC (ref 0.0–0.2)
Platelets: 229 10*3/uL (ref 160–370)
Platelets: 231 10*3/uL (ref 160–370)
RBC: 3.9 MIL/uL (ref 3.9–5.2)
RBC: 4 MIL/uL (ref 3.9–5.2)
RDW: 13.2 % (ref 11.7–14.4)
RDW: 13.4 % (ref 11.7–14.4)
Seg Neut %: 81.6 %
Seg Neut %: 89.7 %
WBC: 14.1 10*3/uL — ABNORMAL HIGH (ref 4.0–10.0)
WBC: 17.8 10*3/uL — ABNORMAL HIGH (ref 4.0–10.0)

## 2019-10-28 LAB — BASIC METABOLIC PANEL
Anion Gap: 6 — ABNORMAL LOW (ref 7–16)
Anion Gap: 6 — ABNORMAL LOW (ref 7–16)
CO2: 31 mmol/L — ABNORMAL HIGH (ref 20–28)
CO2: 32 mmol/L — ABNORMAL HIGH (ref 20–28)
Calcium: 8.4 mg/dL — ABNORMAL LOW (ref 8.6–10.2)
Calcium: 8.8 mg/dL (ref 8.6–10.2)
Chloride: 100 mmol/L (ref 96–108)
Chloride: 99 mmol/L (ref 96–108)
Creatinine: 0.62 mg/dL (ref 0.51–0.95)
Creatinine: 0.73 mg/dL (ref 0.51–0.95)
GFR,Black: 103 *
GFR,Black: 94 *
GFR,Caucasian: 82 *
GFR,Caucasian: 90 *
Glucose: 113 mg/dL — ABNORMAL HIGH (ref 60–99)
Glucose: 127 mg/dL — ABNORMAL HIGH (ref 60–99)
Lab: 13 mg/dL (ref 6–20)
Lab: 17 mg/dL (ref 6–20)
Potassium: 4.3 mmol/L (ref 3.3–5.1)
Potassium: 4.9 mmol/L (ref 3.3–5.1)
Sodium: 137 mmol/L (ref 133–145)
Sodium: 137 mmol/L (ref 133–145)

## 2019-10-28 LAB — MAGNESIUM
Magnesium: 2.1 mg/dL (ref 1.6–2.5)
Magnesium: 2.4 mg/dL (ref 1.6–2.5)

## 2019-10-28 LAB — PHOSPHORUS
Phosphorus: 2.3 mg/dL — ABNORMAL LOW (ref 2.7–4.5)
Phosphorus: 3.5 mg/dL (ref 2.7–4.5)

## 2019-10-28 MED ORDER — OXYCODONE HCL 10 MG PO TABS *I*
10.0000 mg | ORAL_TABLET | Freq: Four times a day (QID) | ORAL | Status: DC
Start: 2019-10-28 — End: 2019-10-29
  Administered 2019-10-28 – 2019-10-29 (×3): 10 mg via ORAL
  Filled 2019-10-28 (×3): qty 1

## 2019-10-28 MED ORDER — ENOXAPARIN SODIUM 40 MG/0.4ML IJ SOSY *I*
40.0000 mg | PREFILLED_SYRINGE | Freq: Every day | INTRAMUSCULAR | Status: DC
Start: 2019-10-28 — End: 2019-10-28

## 2019-10-28 MED ORDER — ENOXAPARIN SODIUM 30 MG/0.3ML IJ SOSY *I*
30.0000 mg | PREFILLED_SYRINGE | Freq: Once | INTRAMUSCULAR | Status: AC
Start: 2019-10-28 — End: 2019-10-28
  Administered 2019-10-28: 30 mg via SUBCUTANEOUS
  Filled 2019-10-28: qty 0.3

## 2019-10-28 MED ORDER — GABAPENTIN 300 MG PO CAPSULE *I*
300.0000 mg | ORAL_CAPSULE | Freq: Three times a day (TID) | ORAL | Status: DC
Start: 2019-10-28 — End: 2019-10-31
  Administered 2019-10-28 – 2019-10-30 (×9): 300 mg via ORAL
  Filled 2019-10-28 (×14): qty 1

## 2019-10-28 MED ORDER — BISACODYL 10 MG RE SUPP *I*
10.0000 mg | Freq: Every day | RECTAL | Status: DC
Start: 2019-10-28 — End: 2019-11-05
  Administered 2019-10-28 – 2019-11-04 (×5): 10 mg via RECTAL
  Filled 2019-10-28: qty 1

## 2019-10-28 MED ORDER — SODIUM PHOSPHATES 3 MMOLE/ML IV SOLN WRAPPED *I*
15.0000 mmol | Freq: Once | INTRAVENOUS | Status: AC
Start: 2019-10-28 — End: 2019-10-28
  Administered 2019-10-28: 15 mmol via INTRAVENOUS
  Filled 2019-10-28: qty 5

## 2019-10-28 MED ORDER — ENOXAPARIN SODIUM 40 MG/0.4ML IJ SOSY *I*
40.0000 mg | PREFILLED_SYRINGE | Freq: Every day | INTRAMUSCULAR | Status: DC
Start: 2019-10-29 — End: 2019-10-29

## 2019-10-28 MED ORDER — KETOROLAC TROMETHAMINE 30 MG/ML IJ SOLN *I*
15.0000 mg | Freq: Four times a day (QID) | INTRAMUSCULAR | Status: AC
Start: 2019-10-28 — End: 2019-10-29
  Administered 2019-10-28 – 2019-10-29 (×4): 15 mg via INTRAVENOUS
  Filled 2019-10-28 (×5): qty 1

## 2019-10-28 MED ORDER — POLYETHYLENE GLYCOL 3350 PO PACK 17 GM *I*
17.0000 g | PACK | Freq: Two times a day (BID) | ORAL | Status: DC
Start: 2019-10-28 — End: 2019-11-05
  Administered 2019-10-28 – 2019-11-04 (×16): 17 g via ORAL
  Filled 2019-10-28 (×16): qty 17

## 2019-10-28 MED ORDER — LORAZEPAM 2 MG/ML IJ SOLN *I*
1.0000 mg | Freq: Once | INTRAMUSCULAR | Status: AC
Start: 2019-10-28 — End: 2019-10-28
  Administered 2019-10-28: 1 mg via INTRAVENOUS
  Filled 2019-10-28: qty 1

## 2019-10-28 NOTE — Addendum Note (Signed)
Addendum  created 10/28/19 1513 by Roetta Sessions, MD    [facility] properties accepted

## 2019-10-28 NOTE — Progress Notes (Signed)
Trauma & Acute Care Surgery Progress Note     LOS: 4 days     Subjective:    No acute events  C spine MRI showing type III odontoid fx w/ 37mm post displacement      Objective:    Vitals Sign Ranges for Past 24 Hours:  BP: (98-157)/(43-73)   Temp:  [36.5 C (97.7 F)-36.9 C (98.4 F)]   Temp src: Axillary (06/17 2000)  Heart Rate:  [70-85]   Resp:  [13-18]   SpO2:  [90 %-99 %]   Height:  [177.8 cm (5\' 10" )]   Weight:  [115.1 kg (253 lb 12 oz)]       Physical Exam:    General Appearance: NAD, sleeping comfortably  HEENT: C-Collar in place  Cardiac: RRR  Respiratory: nonlabored  Abdomen: obese, soft, nt  Extremities: WWP    Labs:    CBC:  Recent Labs   Lab 10/28/19  2314 10/27/19  2336 10/27/19  2336 10/27/19  1202 10/27/19  1202 10/26/19  2319 10/26/19  2319 10/26/19  1308 10/26/19  1308 10/26/19  0818 10/26/19  0818 10/26/19  0341 10/26/19  0341 10/26/19  0042 10/26/19  0042 10/25/19  2048 10/25/19  2048   WBC 17.8*  --  14.1*  --   --   --  14.6*  --   --   --   --   --  15.0*  --  19.5*  --  18.6*   Hemoglobin 11.4   < > 11.3   < > 10.6*   < > 11.5   < > 11.5   < > 11.3   < > 10.2*   < > 12.8   < > 12.3   Hematocrit 37  --  36  --  35  --  36  --  37  --  38   < > 33*   < > 43   < > 41   Platelets 231  --  229  --   --   --  220  --   --   --   --   --  210  --  249  --  281    < > = values in this interval not displayed.       Metabolic Panel:  Recent Labs   Lab 10/28/19  2314 10/27/19  2336 10/26/19  2319 10/26/19  0341 10/26/19  0042 10/25/19  2048   Sodium 137 137 140 142 137  --    Potassium 4.9 4.3 4.5 4.4 CANCELED  --    Chloride 99 100 103 107 103  --    CO2 32* 31* 30* 26 18*  --    UN 17 13 12 16 19   --    Creatinine 0.73 0.62 0.66 0.77 0.84 0.98*   Glucose 127* 113* 142* 144* 142*  --    Calcium 8.8 8.4* 8.7 8.3* 9.2  --    Magnesium 2.4 2.1 2.4 1.8 2.1  --    Phosphorus 3.5 2.3* 3.3 3.6 4.3  --           Assessment:    74 y.o. female L2T fall from 15 stairs 6/15    Type III odontoid fx  Scalp lac    Periaortic fat stranding   ?lumbar vein extrav  Splenic lac,   ?Pancreatic injury,   L 2, 5-6 rib fx   Small extrapleural hematoma      Plan:    -  regular diet  - Appreciate APS assistance for pain control  - Pulm toilet, IS  - SCDs, Lovenox  - appreciate NSG recs. [place], MD  Surgery Resident

## 2019-10-28 NOTE — Plan of Care (Signed)
Problem: Safety  Goal: Patient will remain free of falls  Outcome: Maintaining  Goal: Prevent any intentional injury  Outcome: Maintaining     Problem: Pain/Comfort  Goal: Patient's pain or discomfort is manageable  Outcome: Progressing towards goal     Problem: Nutrition  Goal: Nutritional status is maintained or improved - Geriatric  Outcome: Progressing towards goal     Problem: Mobility  Goal: Functional status is maintained or improved - Geriatric  Outcome: Progressing towards goal     Problem: Psychosocial  Goal: Demonstrates ability to cope with illness  Outcome: Maintaining     Problem: Cognitive function  Goal: Cognitive function will be maintained or return to baseline  Outcome: Maintaining     Problem: Potential Alteration in Skin Integrity - Pressure Ulcer  Goal: The patient will maintain intact skin free of pressure ulcer  Outcome: Maintaining

## 2019-10-28 NOTE — Progress Notes (Signed)
Report Given   Diagnostic Imaging Nurse Handoff:    Study: MRI  A&O:oriented to person, place, and time  Telemetry:Yes, Unit RN to travel and monitor  IV Access:  PCVC Triple Lumen (percutaneous) 10/26/19 Right Femoral (Active)   Phlebitis Scale Grade 0 10/28/19 1144   Infiltration Scale Grade 0 10/28/19 1144   Proximal Lumen Status Capped;Normal saline locked 10/28/19 1144   Medial Lumen Status Capped;Normal saline locked 10/28/19 1144   Distal Lumen Status Capped;Normal saline locked 10/28/19 1144   Dressing Type Transparent;BioPatch 10/28/19 1144   Dressing Status Clean, dry and intact 10/28/19 1144   Dressing Change Due 11/02/19 10/28/19 1144   Bag to hub changed? No 10/28/19 1144   Next bag to hub change due 11/01/19 10/28/19 1144   Cap(s) Changed? No 10/28/19 1144   Next cap change due 11/01/19 10/28/19 1144   **Need for continuing central line addressed? Yes - central line still necessary 10/28/19 1144   Freq of line accesses and lab draws addressed? Yes 10/28/19 1144   CVP Transduced No 10/28/19 1144       Peripheral IV 10/25/19 2044 Left Antecubital (Active)   Phlebitis Scale Grade 0 10/28/19 1144   Infiltration Scale Grade 0 10/28/19 1144   Line Status Saline locked 10/28/19 1144   Dressing Type Transparent 10/28/19 1144   Dressing Status Clean, dry and intact 10/28/19 1144     Continuous Infusions:No  Transducer Cables Inspected for Damage:N/A  Additional lines and drains: None  Medication Patches/Insulin Pumps:No  [facility] gown:Yes  Transport:Bed  Hover Requested:Yes  Respiratory:O2 Nasal Cannula 4 liters  Sleep Apnea:Yes  Claustrophobic:Yes  Sedation Needed:Yes  Pain Concerns:Yes, PCEA bolus dose prior to pulling epidural.  Able to Lay Flat for the Duration of the Scan:Yes  Code status: Prior    Kathrene Alu, RN Received Handoff Report from [employer] for MRI 1:48 PM

## 2019-10-28 NOTE — Progress Notes (Signed)
Burn / Trauma ICU Attending Plan:    I have seen and evaluated the patient personally on rounds with the resident/Fellow/PA/NP ICU team. I personally examined the patient.  All nursing documentation, laboratory data, test results, and radiographs were reviewed and interpreted by me.I agree with the database, findings, and plan of care recorded in the above note. I have reviewed and edited the above note to reflect my findings as well.  I have established the management plan for this patient's critical illness with the ICU team and have been immediately available to assist with patient care. Additional details are highlighted are within my note below.     24hrs: no acute changes.     Catalogue of Injuries    - right scalp hematoma  - left parietal scalp laceration  - type 3 odontoid fx  - left rib fracture  - splenic laceration    Respiratory: moderate supplemental oxygen needs, aggressive pulmonary toileting with IS. Asthma medication resume: albuterol, pulmicort, duonebs.  Acute Pulmonary Insufficiency -- Yes   Acute Respiratory Failure: No  Ventilator dependent: No    CV: hemodynamically apppropriate. Adequate perfusion     Shock: No Dysrrythmia/Arrhythmia: No    GU/Fluids: HLIV. Adequate urine output > 1L. Stable BUN/Cr. hypophos - replaced. Urinary incontinence - use wick catheter for strict I/O's  Acute renal injury: No Chronic renal failure: No Electrolyte abnormality: No Need to keep foley? No    GI/Nutrition: poor po intake, add protein supplement for support, advance as tolerated. Resume PPI home regimen. Continue current bowel regimen.   On GI prophylaxis: Yes, Moderate malnutrition: No     ID: -fever/ stable leukocytosis (14).  Discontinue Right TLC (6/15)  SIRS: Yes Sepsis: No Severe Sepsis: No  Pneumonia: No  COVID Positive: No    Last COVID test date: 6/14    Heme: acute blood loss anemia, resolved), start chemoDVT prophylaxis regimen with lovenox..  SCD's for mechanical prophylaxis  On DVT  Prophylaxis: No: Acute blood loss anemia: No Coagulopathy: No     Endocrine: no current issues.    Hyperglycemia -- No  Insulin regimen -- No  Synthroid -- No    Neuro/Sedation: poor pain management: continue catheter based analgesia. Change oral supplementation po Tylenol + po oxycodone q6, add Toradol 15mg  q6h x24hrs. . Neurocognitive medication regimen: restart home Sertraline. resume neuropathic home regimen.   Encephalopathy: No Coma: NoBrain compression: No    Exts/Consults/Other: ICU prophylaxis regimen  - APS evaluation and management  - neurospine evaluation and management.   - trauma sx evaluation and management. Tertiary evaluation pending  - PT/OT evaluation for dispo recommendations     Social/Family/Dispo: family update  Family present at bedside today?  No    Attending Attestation:    This is a moderate complexity patient.     [X]  This patient is critically ill with at least 1 organ system failure associated with a high probability of life threatening deterioration. The care I have delivered involved high complexity decision making to assess, manipulate, and support vital system function(s), to treat the vital organ system failure and/or prevent further deterioration of the patient's condition. All nursing documentation, laboratory data, test results, and radiographs were reviewed and interpreted by me during multidisciplinary rounds. I have established the management plan for this patient's critical illness and have been immediately available to assist with patient care. My documented total critical care time reflects my own patient care time and does not include teaching or procedure time.  Admission diagnoses for this admission include:  Acute Blood Loss Anemia, Acute Pulmonary Insufficiency Following Surgery and Electrolyte Imbalance hypoMagnesium    Critical care time since midnight/this 24 hour period: Personal time spent - 20 minutes.  This critical care time is exclusive of any time for  separately billable procedures and time spent teaching    Signed by: Melton Krebs, MD  as of 10/28/2019 at 8:44 AM

## 2019-10-28 NOTE — Progress Notes (Signed)
Report Given To  Luana Shu, RN    6/17 D12 0700-1900:     Afebrile. VS WDL. Unable to wean d/t O2 desaturations to 80-82% while asleep; home CPAP brought in by Son. APS at the bedside to remove Erector Spinae Block for MRI; bolus dose of Bupivacaine given prior to removal. Plans to re-insert APS Block tomorrow morning; APS requesting morning dose of Enoxaparin to be held. Pain more well-controlled with new pain regiment, please see MAR for details. MRI C-spine completed; One-time dose of 1mg  Ativan given prior to scan for pt comfort. Pt voiding spontaneously, 1BM. OOB to chair via hover for two hours, tolerated well.  Family updated at the bedside by nursing and by Provider. For additional information, please refer to [facility] and Flowsheets.    Sena Clouatre Silver Huguenin, RN    Descriptive Sentence / Reason for Admission   [name] is a 75 y.o. female w/pmhx CAD s/p CABG 2012 in [place] and recent stenting angio 2015 on plavix, COPD, OSA, DM who presented as level II Trauma after fall down 12 stairs at nephew's house during family party.     Injuries:     - right scalp hematoma  - left parietal scalp laceration  - type 3 odontoid fx  - left rib fracture  - splenic laceration    Vascular surgery consulted for assessment and management.         Active Issues / Relevant Events   615N: Admit to [place]. Hypotensive 60/30s with AMS; BP then 80/40s after 2 x 1 L LR fluid boluses given. Given 1 unit PRBC and 1 unit platelets. EKG with trops sent.  R fem triple lumen CVC placed and L fem a-line placed. STAT CTA head/chest/abdomen completed. COVID NEG.  6/15D: Echo done 1015 Erector Spinae Block placed by APS 1550  6/16N: Hypertensive SBPs 170-180s. Given prn 0.5 dilaudid x 1 and 5 mg oxy with no therapeutic effect. Provider notified; pt given 1 time dose 0.25 dilaudid, 10 mg oxy and bupivicaine bolus on PCEA. EKG completed.   6/17: APS Block d/c for MRI. MRI C-Spine completed.       To Do List  Q1 VS  Q1 I&O  Q4 Neuro  checks  Regular Diet  Daily labs      Anticipatory Guidance / Discharge Planning  Transfer to floor when medically stable.    Foley Lot# [id number]  Daughter [name] took Gold ring and opal earrings home 6/15

## 2019-10-28 NOTE — Progress Notes (Addendum)
Acute Pain Service Adult Daily Progress Note for Inpatients  Hospital-day:  LOS: 3 days     Closed odontoid fracture with type III morphology, initial encounter    Overnight Issues: NAE. IS ~500     Pain Scores:   Rest:0/10   Cough/Move:10/10   24hr High:10/10    Last Nursing documented pain:  0-10 Scale: 5 (10/28/19 0800)      Significant 24hr Events:  Fever: no   Anticoagulation: holding plavix through discharge, on BID lovenox     Vitals:  Patient Vitals for the past 24 hrs:   BP Temp Temp src Pulse Resp SpO2   10/28/19 0900 (!) 108/47   74 15 98 %   10/28/19 0833      96 %   10/28/19 0800 120/65 36.7 C (98 F) Axillary 81 17 96 %   10/28/19 0700 145/73   82 16 93 %   10/28/19 0600 157/72   81 17 95 %   10/28/19 0500 152/73   76 16 95 %   10/28/19 0400 150/72 36.8 C (98.2 F) Axillary 75 16 94 %   10/28/19 0300 139/68   72 15 90 %   10/28/19 0200 126/65   73 14 91 %   10/28/19 0100 139/60   75 14 93 %   10/28/19 0000 153/66 37 C (98.6 F) Axillary 76 15 93 %   10/27/19 2200 126/54   78 15 92 %   10/27/19 2100 116/55   73 15 99 %   10/27/19 2000 (!) 90/42 37.2 C (98.9 F) Axillary 72 15 95 %   10/27/19 1900 (!) 108/41   72 14 91 %   10/27/19 1800 138/61   76 16 96 %   10/27/19 1700 150/63   71 17 96 %   10/27/19 1600 153/62 36.8 C (98.2 F) FOLEY 69 13 94 %   10/27/19 1500 154/64   70 13 93 %   10/27/19 1400 144/66   66 16 96 %   10/27/19 1300 129/55 36.6 C (97.9 F) FOLEY 67 14 97 %   10/27/19 1200 143/66 36.4 C (97.5 F) FOLEY 67 12 99 %   10/27/19 1100 132/59 36.5 C (97.7 F) FOLEY 62 11 95 %     O2 Flow Rate: 4 L/min (10/28/19 0900)       Mental Status: alert, oriented to person, place, and time    IS Volume: 500    Cough Quality: weak    Intake/Output last 3 shifts:  I/O last 3 completed shifts:  06/16 0700 - 06/17 0659  In: 1252.7 (10.9 mL/kg) [P.O.:435; I.V.:682.5 (0.2 mL/kg/hr); IV Piggyback:135.2]  Out: 400 (3.5 mL/kg) [Urine:400 (0.1 mL/kg/hr)]  Net:  852.7  Weight: 115.1 kg   Intake/Output this shift:  I/O this shift:  06/17 0700 - 06/17 1459  In: 305.8 (2.7 mL/kg) [P.O.:50; IV Piggyback:255.8]  Out: - (0 mL/kg)   Net: 305.8  Weight: 115.1 kg     PERINEURAL CATHETER   Catheter Day #: 3  Location: L ESP   Skin Mark: 10 cm  Site: Erythema:no     Tender:no    Edema:no, the dressing is saturated from infusion.  Bupivacaine 0.125%  Rate: 10 ml/hr + 6 ml Q 60 min PRN  Please refer to pain report for cath meds usage    Covers Pain Area: yes  Motor: Upper L:Grade 5: Normal Strength/5  R:Grade 5: Normal Strength/5       Allergies:  Allergies   Allergen Reactions    Morphine Hives     Has tolerated Percocet       PRN Medications: (*Numbers appearing after the prn medications listed below indicate doses used since the patient was last seen by this service.)   oxyCODONE  5 mg Oral Q4H PRN    Or    oxyCODONE  10 mg Oral Q4H PRN    ondansetron  4 mg Intravenous Q6H PRN    sodium chloride  10 mL Intravenous PRN    And    sodium chloride  20 mL Intravenous PRN    albuterol  2.5 mg Nebulization Q6H PRN    sodium chloride  0-500 mL/hr Intravenous PRN    dextrose  0-500 mL/hr Intravenous PRN       IV Medications:    Bupivacaine 0.125% in sodium chloride 0.9% 250 mL  10 mL/hr via Peripheral Nerve Catheter Continuous       Scheduled Medications:    bisacodyl  10 mg Rectal Daily    polyethylene glycol  17 g Oral 2 times per day    enoxaparin  30 mg Subcutaneous 2 times per day    acetaminophen  1,000 mg Oral Q8H    budesonide  0.5 mg Nebulization 2 times per day    bacitracin zinc-polymyxin B sulfate   Topical 2 times per day    pantoprazole  20 mg Oral QAM    sertraline  100 mg Oral Daily    senna  2 tablet Oral 2 times per day    ipratropium-albuterol  3 mL Nebulization Q6H       Lab Results:   All labs in the last 24 hours:   Recent Results (from the past 24 hour(s))   HCT and Hgb    Collection Time: 10/27/19 12:02 PM   Result Value Ref Range    Hemoglobin  10.6 (L) 11.2 - 15.7 g/dL    Hematocrit 35 34.00 - 45.00 %   MCHC    Collection Time: 10/27/19 12:02 PM   Result Value Ref Range    MCHC 31 (L) 32 - 36 g/dL   Phosphorus    Collection Time: 10/27/19 11:36 PM   Result Value Ref Range    Phosphorus 2.3 (L) 2.7 - 4.5 mg/dL   Magnesium    Collection Time: 10/27/19 11:36 PM   Result Value Ref Range    Magnesium 2.1 1.6 - 2.5 mg/dL   Basic metabolic panel    Collection Time: 10/27/19 11:36 PM   Result Value Ref Range    Glucose 113 (H) 60 - 99 mg/dL    Sodium 137 133 - 145 mmol/L    Potassium 4.3 3.3 - 5.1 mmol/L    Chloride 100 96 - 108 mmol/L    CO2 31 (H) 20 - 28 mmol/L    Anion Gap 6 (L) 7 - 16    UN 13 6 - 20 mg/dL    Creatinine 0.62 0.51 - 0.95 mg/dL    GFR,Caucasian 90 *    GFR,Black 103 *    Calcium 8.4 (L) 8.6 - 10.2 mg/dL   CBC and differential    Collection Time: 10/27/19 11:36 PM   Result Value Ref Range    WBC 14.1 (H) 4.0 - 10.0 THOU/uL    RBC 3.9 3.90 - 5.20 MIL/uL    Hemoglobin 11.3 11.2 - 15.7 g/dL    Hematocrit 36 34.00 - 45.00 %    MCV 92 79.0 - 95.0 fL  MCH 29 26 - 32 pg    MCHC 32 32 - 36 g/dL    RDW 13.4 11.7 - 14.4 %    Platelets 229 160 - 370 THOU/uL    Seg Neut % 81.6 %    Lymphocyte % 7.4 %    Monocyte % 9.0 %    Eosinophil % 0.9 %    Basophil % 0.4 %    Neut # K/uL 11.5 (H) 1.6 - 6.1 THOU/uL    Lymph # K/uL 1.0 (L) 1.2 - 3.7 THOU/uL    Mono # K/uL 1.3 (H) 0.2 - 0.9 THOU/uL    Eos # K/uL 0.1 0.0 - 0.4 THOU/uL    Baso # K/uL 0.1 0.0 - 0.1 THOU/uL    Nucl RBC % 0.0 0.0 - 0.2 /100 WBC    Nucl RBC # K/uL 0.0 0.0 - 0.0 THOU/uL    IMM Granulocytes # 0.1 (H) 0.0 - 0.0 THOU/uL    IMM Granulocytes 0.7 %         Lab results: 10/27/19  2336   WBC 14.1*   Hemoglobin 11.3   Hematocrit 36   RBC 3.9   Platelets 229           Lab results: 10/25/19  2048   Protime 11.6   INR 1.0           Assessment/Plan: 15F w/ numerous injuries including left posterior 2, 5, 6 rib fractures for which we placed a left sided T5 erector spinae catheter 6/15, which is working  well.  .    Plan:  Left thoracic erector spinae catheter (6/15)  - working well for controlling her rib fracture pain.  - continue bupivicaine 0.125% at 46ml/hr w/ 65ml prn q4m  - bolused on rounds this morning with 10cc 0.25% bupi  - we will remove the catheter today prior to 3pm MRI with the plan of replacing tomorrow  -please hold morning lovenox prior to procedure tomorrow 6/18.   -ESP catheters should be on daily ppx dosing of lovenox. Please change to BID --> daily. We are happy to discuss anticoagulation/antiplatelet guidelines prior to initiation.   -per discussion with [facility] will hold off plavix for now. If that changes please let us know.     Recommendations:  - continue acetaminophen  - continue scheduled NSAIDs  - restart home gabapentin  - continue oxycodone prn or equivalent  - bowel regimen while taking narcotics      Pain Consult  LOS: [id number]     Responsible Anesthesia Attestation:  I saw and evaluated the patient. I agree with the resident's/fellow's findings and plan of care as documented above.   Tommi Emery, MD  10/28/19, 12:49 PM            Author: Shelda Altes, MD as of 10/28/2019  at 10:17 AM

## 2019-10-28 NOTE — Progress Notes (Signed)
BTICU Critical Care Progress Note    Interval History:   - MRI c-spine completed  - pain controlled and slept overnight     LOS: 4 days     Current Vitals BP 141/74    Pulse 77    Temp 37 C (98.6 F) (Temporal)    Resp 15    Ht 1.778 m (_0 )    Wt 115.1 kg (253 lb 12 oz)    LMP  (LMP Unknown)    SpO2 94%    BMI 36.41 kg/m    VS Range (24 hrs) BP: (98-145)/(43-74)   Temp:  [36.5 C (97.7 F)-37 C (98.6 F)]   Temp src: Temporal (06/18 0400)  Heart Rate:  [70-86]   Resp:  [13-20]   SpO2:  [92 %-99 %]   Height:  [177.8 cm (_1 )]   Weight:  [115.1 kg (253 lb 12 oz)]             Nursing pain score: Last Nursing documented pain:  0-10 Scale: 0 (10/29/19 0000)      Intake/Output:  Date 10/28/19 0700 - 10/29/19 0659 10/29/19 0700 - 10/30/19 0659   Shift 0700-1459 1500-2259 2300-0659 24 Hour Total 0700-1459 1500-2259 2300-0659 24 Hour Total   INTAKE   P.O. 100  120 220         P.O. 100  120 220       IV Piggyback 255.8   255.8         Volume (mL) (sodium phosphates 15 mmol in sodium chloride 0.9 % 280 mL IVPB) 255.8   255.8       Shift Total(mL/kg) 355.8(3.1)  120(1) 475.8(4.1)       OUTPUT   Urine(mL/kg/hr)  50(0.1)  50         Urine  50  50         Urine Occurrence  1 x  1 x         Pre-Void Bladder Scan Reading 428 ml   428 ml         Post-Void Bladder Scan Reading  26 ml  26 ml       Stool             Stool Occurrence  1 x  1 x       Shift Total(mL/kg)  50(0.4)  50(0.4)       NET 355.8 -50 120 425.8       Weight (kg) 115.1 115.1 115.1 115.1 115.1 115.1 115.1 115.1        Active Hospital Medications:   Scheduled Meds  bisacodyl  10 mg Rectal Daily    polyethylene glycol  17 g Oral 2 times per day    oxyCODONE  10 mg Oral Q6H    gabapentin  300 mg Oral TID    enoxaparin  40 mg Subcutaneous Daily @ 2100    acetaminophen  1,000 mg Oral Q8H    budesonide  0.5 mg Nebulization 2 times per day    bacitracin zinc-polymyxin B sulfate   Topical 2 times per day    pantoprazole  20 mg Oral QAM    sertraline  100 mg  Oral Daily    senna  2 tablet Oral 2 times per day    ipratropium-albuterol  3 mL Nebulization Q6H      Continuous Infusions    PRN Meds ondansetron, albuterol, sodium chloride, dextrose      Physical Exam by Systems:  BP 141/74  Pulse 77    Temp 37 C (98.6 F) (Temporal)    Resp 15    Ht 1.778 m (_0 )    Wt 115.1 kg (253 lb 12 oz)    LMP  (LMP Unknown)    SpO2 94%    BMI 36.41 kg/m     General: NAD   CVS: Regular  PULM: diminished bibasalar  ABD: soft, NT, ND, BS faint  EXT: trace edema, distal pulses 2+  NEURO: AAOx3, grossly normal   C-Collar Cleared: no       LABS   BMP:   Recent Labs   Lab 10/28/19  2314 10/27/19  2336 10/26/19  2319 10/26/19  0042 10/25/19  2048   Sodium 137 137 140   < >  --    Potassium 4.9 4.3 4.5   < >  --    Chloride 99 100 103   < >  --    CO2 32* 31* 30*   < >  --    Anion Gap 6* 6* 7   < >  --    UN _1 < >  --    Creatinine 0.73 0.62 0.66   < > 0.98*   Glucose 127* 113* 142*   < >  --    Calcium 8.8 8.4* 8.7   < >  --    Albumin  --   --   --   --  3.8   Phosphorus 3.5 2.3* 3.3   < >  --    Magnesium 2.4 2.1 2.4   < >  --     < > = values in this interval not displayed.     CBC:   Recent Labs   Lab 10/28/19  2314 10/27/19  2336 10/27/19  1202 10/26/19  2319 10/26/19  2319   WBC 17.8* 14.1*  --   --  14.6*   Hemoglobin 11.4 11.3 10.6*   < > 11.5   Hematocrit 37 36 35   < > 36   Platelets 231 229  --   --  220    < > = values in this interval not displayed.     Liver Panel:   Recent Labs   Lab 10/26/19  2319 10/25/19  2048   Albumin  --  3.8   Total Protein  --  6.5   Bilirubin,Total  --  0.2   Bilirubin,Direct  --  <0.2   AST  --  57*   ALT  --  39*   Alk Phos  --  157*   Amylase 27* 50   Lipase 12* 59     ABG:   Recent Labs   Lab 10/25/19  2048   Lactate 1.4     Other labs: No results for input(s): INR, CRP, ESR, TROP, CK, MCKMB, BNP in the last 72 hours.    No components found with this basename: PT, APT       Radiology results   EKG 12 lead    Result Date:  10/28/2019  Sinus rhythm Prolonged PR interval Low voltage, precordial leads Borderline T abnormalities, anterior leads    MR cervical spine without contrast    Result Date: 10/28/2019  Type III odontoid fracture, with 2 mm posterior displacement of the odontoid fracture fragment, extension into the right C2 lateral mass, and disruption of the posterior longitudinal ligament. Injury to the anterior longitudinal ligament may also be present. END OF IMPRESSION  I have personally reviewed the images and the Resident's/Fellow's interpretation and agree with or edited the findings. [facility] submits this DICOM format image data and final report to the [facility], an independent secure electronic health information exchange, on a reciprocally searchable basis (with patient authorization) for a minimum of 12 months after exam date.       ASSESSMENT: [name] is a 74 y.o. female L2T fall from 15 stairs on 6/15    Type III odontoid fx  Scalp lac   Periaortic fat stranding   ?lumbar vein extrav  Splenic lac,   ?Pancreatic injury,    L 2, 5-6 rib fx   Small extrapleural hematoma       Currently Active/Followed Hospital Problems:  Principal Problem:    Fall from 15 stairs 6/15 type III odontoid fx, scalp lac, ?lumbar vein extrav, periaortic fat stranding, splenic lac, ?pancreatic injury, L 2, 5-6 rib fx, small extrapleural hematoma    Patient Active Problem List   Diagnosis Code    Severe Obstructive sleep apnea G47.33    Pulmonary nodule R91.1    CAD (coronary artery disease) I25.10    Prediabetes R73.03    Gastritis K29.70    Obesity E66.9    Dysthymia F34.1    Facial fracture due to fall S02.92XA, [id number]    Orbital floor fracture S02.30XA    Restless leg syndrome G25.81    Health care maintenance Z00.00    Aortic stenosis I35.0    Anxiety and depression F41.9, F32.9    COPD (chronic obstructive pulmonary disease) J44.9    Osteopenia M85.80    Environmental allergies Z91.09    Fall from 15  stairs 6/15 type III odontoid fx, scalp lac, ?lumbar vein extrav, periaortic fat stranding, splenic lac, ?pancreatic injury, L 2, 5-6 rib fx, small extrapleural hematoma S12.120A       PLAN BY SYSTEMS:   Pulm: rib fx's  - home CPAP brought in for use  - wean NC for sat>92%  - encourage pulm toilet with IS/C/DB  - cont pulmicort and duonebs (in place of home Trelegy Ellipta), and prn albuterol    CVS: grossly normotensive  - consider resuming home statin  BP  Min: 98/43  Max: 145/73  Pulse  Avg: 77.2  Min: 70  Max: 86     Renal/E/F: normal renal function  - purewick in place    Intake/Output Summary (Last 24 hours) at 10/29/2019 0603  Last data filed at 10/28/2019 2359  Gross per 24 hour   Intake 475.75 ml   Output 50 ml   Net 425.75 ml           Recent Labs   Lab 10/28/19  2314 10/27/19  2336   UN 17 13   Creatinine 0.73 0.62   Potassium 4.9 4.3       GI/Nutrition:   - Regular Diet  - bowel reg (miralax, senna and dulcolax scheduled)  - on home PPI    ID: no infectious concerns  - afebrile, leukocytosis  - cx if febrile  - COVID neg 6/14  Temp (24hrs), Avg:36.7 C (98.1 F), Min:36.5 C (97.7 F), Max:37 C (98.6 F)      Recent Labs   Lab 10/28/19  2314 10/27/19  2336 10/26/19  2319   WBC 17.8* 14.1* 14.6*       Heme:   Recent Labs   Lab 10/28/19  2314 10/27/19  2336 10/27/19  1202   Hematocrit 37 36 35   -  Lovenox for DVT prophylaxis, will hold AM dosing for epidural- pt will get 34m this evening   Transfusion? Is Hct <21? no    Endo: euglycemia    Neuro/pain: Acute Pain  - APS removed epidural 6/17 for MRI, will replace today  - Oxy and Toradol ATC for 24 hours while epidural out  - Tylenol ATC  - continue Zoloft and home gabapentin    Integument: Routine wound and ICU care.    Musculoskeletal: Type III Odontoid Fx  - MRI completed yesterday-NSG paged to make aware  - [place] j collar, AAT      Author: JGavin Potters NP  as of: 10/29/2019  at: 6:03 AM

## 2019-10-28 NOTE — Progress Notes (Signed)
Report Given To  Cuda RN      Afebrile, AVSS. O2 briefly dips to 87% at times while sleeping but comes right back up. Wearing nasal cannula but breathes though her mouth. A+Ox3. Follows commands and moves all extremities. X-ray of left knee complete. No fractures. Extra dose of oxy given for severe pain with good effect. No spontaneous void before 10pm. Bladder scanned for 166ml. Provider notified. Voided around 0200. No BM. Called MRI but said nerve block has to be removed prior to MRI. MRI screening form complete. Follow up w/ APS in am. Incentive spirometer encouraged. Pt. Only pulling up to 500. Sodium phosphate given overnight. Slept fairly well. Please see doc flow sheets, MAR and results review for more details. Tresa Moore, RN      Descriptive Sentence / Reason for Admission   [name] is a 74 y.o. female w/pmhx CAD s/p CABG 2012 in [place] and recent stenting angio 2015 on plavix, COPD, OSA, DM who presented as level II Trauma after fall down 12 stairs at nephew's house during family party.     Injuries:     - right scalp hematoma  - left parietal scalp laceration  - type 3 odontoid fx  - left rib fracture  - splenic laceration    Vascular surgery consulted for assessment and management.         Active Issues / Relevant Events   615N: Admit to [place]. Hypotensive 60/30s with AMS; BP then 80/40s after 2 x 1 L LR fluid boluses given. Given 1 unit PRBC and 1 unit platelets. EKG with trops sent.  R fem triple lumen CVC placed and L fem a-line placed. STAT CTA head/chest/abdomen completed. COVID NEG.  6/15D: Echo done 1015 Erector Spinae Block placed by APS 1550  6/16N: Hypertensive SBPs 170-180s. Given prn 0.5 dilaudid x 1 and 5 mg oxy with no therapeutic effect. Provider notified; pt given 1 time dose 0.25 dilaudid, 10 mg oxy and bupivicaine bolus on PCEA. EKG completed.       To Do List  Q1 VS  Q1 I&O  Q4 Neuro checks  Regular Diet  Daily labs    MRI c-spine without contrast be completed        Anticipatory Guidance / Discharge Planning  Transfer to floor when medically stable.    Foley Lot# [id number]  Daughter [name] took Gold ring and opal earrings home 6/15

## 2019-10-29 ENCOUNTER — Inpatient Hospital Stay: Payer: Medicare (Managed Care)

## 2019-10-29 DIAGNOSIS — R0902 Hypoxemia: Secondary | ICD-10-CM

## 2019-10-29 DIAGNOSIS — R918 Other nonspecific abnormal finding of lung field: Secondary | ICD-10-CM

## 2019-10-29 MED ORDER — D5W & 0.45% NACL IV SOLN *I*
100.0000 mL/h | INTRAVENOUS | Status: DC
Start: 2019-10-29 — End: 2019-10-30
  Administered 2019-10-29 (×2): 100 mL/h via INTRAVENOUS
  Administered 2019-10-29 (×10): 100 mL/h
  Administered 2019-10-29: 100 mL/h via INTRAVENOUS
  Administered 2019-10-29 – 2019-10-30 (×20): 100 mL/h
  Administered 2019-10-30: 100 mL/h via INTRAVENOUS

## 2019-10-29 MED ORDER — LIDOCAINE 5 % EX PTCH *I*
1.0000 | MEDICATED_PATCH | Freq: Every day | CUTANEOUS | Status: DC
Start: 2019-10-29 — End: 2019-11-09
  Administered 2019-10-29 – 2019-11-09 (×11): 1 via TRANSDERMAL
  Filled 2019-10-29 (×14): qty 1

## 2019-10-29 MED ORDER — BUDESONIDE 0.5 MG/2ML IN SUSP *I*
0.5000 mg | Freq: Two times a day (BID) | RESPIRATORY_TRACT | Status: DC
Start: 2019-10-29 — End: 2019-11-09
  Administered 2019-10-29 – 2019-11-09 (×21): 0.5 mg via RESPIRATORY_TRACT
  Filled 2019-10-29 (×28): qty 2

## 2019-10-29 MED ORDER — KETOROLAC TROMETHAMINE 30 MG/ML IJ SOLN *I*
15.0000 mg | Freq: Four times a day (QID) | INTRAMUSCULAR | Status: AC
Start: 2019-10-29 — End: 2019-10-30
  Administered 2019-10-29 – 2019-10-30 (×3): 15 mg via INTRAVENOUS
  Filled 2019-10-29 (×3): qty 1

## 2019-10-29 MED ORDER — ENOXAPARIN SODIUM 30 MG/0.3ML IJ SOSY *I*
30.0000 mg | PREFILLED_SYRINGE | Freq: Two times a day (BID) | INTRAMUSCULAR | Status: DC
Start: 2019-10-29 — End: 2019-11-09
  Administered 2019-10-29 – 2019-11-09 (×22): 30 mg via SUBCUTANEOUS
  Filled 2019-10-29 (×23): qty 0.3

## 2019-10-29 MED ORDER — ATORVASTATIN CALCIUM 40 MG PO TABS *I*
40.0000 mg | ORAL_TABLET | Freq: Every day | ORAL | Status: DC
Start: 2019-10-29 — End: 2019-11-09
  Administered 2019-10-29 – 2019-11-09 (×12): 40 mg via ORAL
  Filled 2019-10-29 (×13): qty 1

## 2019-10-29 MED ORDER — OXYCODONE HCL 5 MG PO TABS *I*
5.0000 mg | ORAL_TABLET | Freq: Four times a day (QID) | ORAL | Status: AC
Start: 2019-10-29 — End: 2019-10-31
  Administered 2019-10-30 – 2019-10-31 (×5): 5 mg via ORAL
  Filled 2019-10-29 (×5): qty 1

## 2019-10-29 NOTE — Progress Notes (Signed)
Report Given To  Marina Gravel, RN    Afebrile. Desatting to low 80s while asleep. Chest xray done per order. Pt placed on CPAP early. Started on MIVF D51/2NS at 100/hr per order. Foley placed per order. Pt strongly encouraged to eat and drink more. Pt drank her vanilla scandishake. Daughter brought in flavored seltzer water for pt. Pt drinking fair. Pt started on calorie counts. VS q4 per order. C-collar resized. PT came and worked with pt today. Pt partially stood up from the chair with a heavy 2 assist. Please refer to vital signs, nursing assessment and I&O for further details. Carma Lair, RN             Descriptive Sentence / Reason for Admission   [name] is a 74 y.o. female w/pmhx CAD s/p CABG 2012 in [place] and recent stenting angio 2015 on plavix, COPD, OSA, DM who presented as level II Trauma after fall down 12 stairs at nephew's house during family party.     Injuries:     - right scalp hematoma  - left parietal scalp laceration  - type 3 odontoid fx  - left rib fracture  - splenic laceration    Vascular surgery consulted for assessment and management.         Active Issues / Relevant Events   615N: Admit to [place]. Hypotensive 60/30s with AMS; BP then 80/40s after 2 x 1 L LR fluid boluses given. Given 1 unit PRBC and 1 unit platelets. EKG with trops sent.  R fem triple lumen CVC placed and L fem a-line placed. STAT CTA head/chest/abdomen completed. COVID NEG.  6/15D: Echo done 1015 Erector Spinae Block placed by APS 1550  6/16N: Hypertensive SBPs 170-180s. Given prn 0.5 dilaudid x 1 and 5 mg oxy with no therapeutic effect. Provider notified; pt given 1 time dose 0.25 dilaudid, 10 mg oxy and bupivicaine bolus on PCEA. EKG completed.   6/17: APS Block d/c for MRI. MRI C-Spine completed.   6/18: MIVF. Foley. Desatted x4 high 70s- chest xray and CPAP. 10mg  oxy given- pt lethargic.      To Do List  Q4 VS  Q1 I&O  Q4 Neuro checks  Regular Diet- Calorie count  Daily labs      Anticipatory Guidance /  Discharge Planning  Transfer to floor when medically stable.    Foley Lot# [id number]  Daughter [name] took Gold ring and opal earrings home 6/15

## 2019-10-29 NOTE — Progress Notes (Signed)
Report Given To  [facility] RN    Afebrile, AVSS. A+Ox3. Follows commands and moves all extremities. Very drowsy after receiving ativan for MRI. MRI pending. Urinated small amt. spontaneously in bedpan. Bladder scanned after for 76ml. Urine amber and malodorous. Fem line removed per providers orders. Pain well managed with toradol. Refused oxy at hs. Slept well overnight on home CPAP. Please see doc flow sheets, MAR and results review for more details. Tresa Moore, RN        Descriptive Sentence / Reason for Admission   [name] is a 74 y.o. female w/pmhx CAD s/p CABG 2012 in [place] and recent stenting angio 2015 on plavix, COPD, OSA, DM who presented as level II Trauma after fall down 12 stairs at nephew's house during family party.     Injuries:     - right scalp hematoma  - left parietal scalp laceration  - type 3 odontoid fx  - left rib fracture  - splenic laceration    Vascular surgery consulted for assessment and management.         Active Issues / Relevant Events   615N: Admit to [place]. Hypotensive 60/30s with AMS; BP then 80/40s after 2 x 1 L LR fluid boluses given. Given 1 unit PRBC and 1 unit platelets. EKG with trops sent.  R fem triple lumen CVC placed and L fem a-line placed. STAT CTA head/chest/abdomen completed. COVID NEG.  6/15D: Echo done 1015 Erector Spinae Block placed by APS 1550  6/16N: Hypertensive SBPs 170-180s. Given prn 0.5 dilaudid x 1 and 5 mg oxy with no therapeutic effect. Provider notified; pt given 1 time dose 0.25 dilaudid, 10 mg oxy and bupivicaine bolus on PCEA. EKG completed.   6/17: APS Block d/c for MRI. MRI C-Spine completed.       To Do List  Q2 VS  Q1 I&O  Q4 Neuro checks  Regular Diet  Daily labs      Anticipatory Guidance / Discharge Planning  Transfer to floor when medically stable.    Foley Lot# [id number]  Daughter [name] took Gold ring and opal earrings home 6/15

## 2019-10-29 NOTE — Plan of Care (Signed)
Problem: Impaired Bed Mobility  Goal: STG - IMPROVE BED MOBILITY  Note: Patient will perform bed mobility with rails and the head of bed up with Minimal assist of 1    Time frame: 7-10 days     Problem: Impaired Transfers  Goal: STG - IMPROVE TRANSFERS  Note: Patient will complete Sit to stand transfers using least restrictive assistive device with Minimal assist of 2    Time frame: 7-10 days

## 2019-10-29 NOTE — Telephone Encounter (Signed)
Safe Transitions Review of Chart done today.  Risk Score %: 51   Patient is admitted to:   [facility]   [phone]      Being followed by:   Critical Care  Diagnosis:    Fall down 12 steps  Injuries:   - right scalp hematoma  - left parietal scalp laceration  - type 3 odontoid fx  - left rib fracture  - splenic laceration  Est. Discharge Date:   To Be Determined/continues in ICU   I will continue to review patient's progress until discharge.  If at discharge PCP feels it is warranted, we can discuss Care Management needs at that time.   Time spent on chart review: 15 minutes    Encarnacion Chu, RN, [place]   [facility] [place]

## 2019-10-29 NOTE — Progress Notes (Signed)
Trauma & Acute Care Surgery Progress Note     LOS: 4 days     Subjective:  NUS signed off    Objective:    Vitals Sign Ranges for Past 24 Hours:  BP: (103-141)/(45-74)   Temp:  [36.5 C (97.7 F)-37 C (98.6 F)]   Temp src: Foley (06/18 2000)  Heart Rate:  [68-89]   Resp:  [12-20]   SpO2:  [91 %-100 %]   Weight:  [120 kg (264 lb 8.8 oz)]       Physical Exam:    General Appearance: NAD, sleeping comfortably  HEENT: C-Collar in place  Cardiac: RRR  Respiratory: nonlabored  Abdomen: obese, soft, nt  Extremities: WWP    Labs:    CBC:  Recent Labs   Lab 10/28/19  2314 10/27/19  2336 10/27/19  2336 10/27/19  1202 10/27/19  1202 10/26/19  2319 10/26/19  2319 10/26/19  1308 10/26/19  1308 10/26/19  0818 10/26/19  0818 10/26/19  0341 10/26/19  0341 10/26/19  0042 10/26/19  0042 10/25/19  2048 10/25/19  2048   WBC 17.8*  --  14.1*  --   --   --  14.6*  --   --   --   --   --  15.0*  --  19.5*  --  18.6*   Hemoglobin 11.4   < > 11.3   < > 10.6*   < > 11.5   < > 11.5   < > 11.3   < > 10.2*   < > 12.8   < > 12.3   Hematocrit 37  --  36  --  35  --  36  --  37  --  38   < > 33*   < > 43   < > 41   Platelets 231  --  229  --   --   --  220  --   --   --   --   --  210  --  249  --  281    < > = values in this interval not displayed.       Metabolic Panel:  Recent Labs   Lab 10/28/19  2314 10/27/19  2336 10/26/19  2319 10/26/19  0341 10/26/19  0042 10/25/19  2048   Sodium 137 137 140 142 137  --    Potassium 4.9 4.3 4.5 4.4 CANCELED  --    Chloride 99 100 103 107 103  --    CO2 32* 31* 30* 26 18*  --    UN 17 13 12 16 19   --    Creatinine 0.73 0.62 0.66 0.77 0.84 0.98*   Glucose 127* 113* 142* 144* 142*  --    Calcium 8.8 8.4* 8.7 8.3* 9.2  --    Magnesium 2.4 2.1 2.4 1.8 2.1  --    Phosphorus 3.5 2.3* 3.3 3.6 4.3  --           Assessment:    74 y.o. female L2T fall from 15 stairs 6/15    Type III odontoid fx  Scalp lac   Periaortic fat stranding   ?lumbar vein extrav  Splenic lac,   ?Pancreatic injury,   L 2, 5-6 rib fx   Small  extrapleural hematoma      Plan:    - regular diet  - F/U WBC  - Appreciate APS assistance for pain control  - Pulm toilet, IS  - SCDs, Lovenox  -  appreciate NSG recs. [place] J collar; F/U w/ Dr. Marianne Sofia in 4-6 weeks  - Appreciate BTICU care      Neta Ehlers, MD  Surgery Resident

## 2019-10-29 NOTE — Progress Notes (Signed)
Neurosurgery Brief Note    Continue C-collar for 4-6 weeks and follow up with Dr. Marianne Sofia.    Fayette Pho MD  Neurosurgery Resident

## 2019-10-29 NOTE — Progress Notes (Signed)
Burn / Trauma ICU Attending Plan:    I have seen and evaluated the patient personally on rounds with the resident/Fellow/PA/NP ICU team. I personally examined the patient.  All nursing documentation, laboratory data, test results, and radiographs were reviewed and interpreted by me.I agree with the database, findings, and plan of care recorded in the above note. I have reviewed and edited the above note to reflect my findings as well.  I have established the management plan for this patient's critical illness with the ICU team and have been immediately available to assist with patient care. Additional details are highlighted are within my note below.     24hrs: MRI cspine overnight     Catalogue of Injuries    - right scalp hematoma  - left parietal scalp laceration  - type 3 odontoid fx  - left rib fracture  - splenic laceration    Respiratory: moderate supplemental oxygen needs: home CPAP settings overnight, aggressive pulmonary toileting with IS. Asthma medication resume: albuterol, pulmicort, duonebs.  Acute Pulmonary Insufficiency -- Yes   Acute Respiratory Failure: No  Ventilator dependent: No    CV: hemodynamically apppropriate. Adequate perfusion. Hold Plavix until discharge. q4 vitals.      Shock: No Dysrrythmia/Arrhythmia: No    GU/Fluids: poor urine output, poor enteral intake: start mIVF: D51/2NS. Stable BUN/Cr. Stable lytes. Place foley cathter for adequate urine output measurement.  Acute renal injury: No Chronic renal failure: No Electrolyte abnormality: No Need to keep foley? No    GI/Nutrition: poor enteral intake, add protein supplement for support, possible need for enteral intake. PPI home regimen. Continue current bowel regimen.   On GI prophylaxis: Yes, Moderate malnutrition: No     ID: -fever/uptrending leukocytosis (17.8).   SIRS: Yes Sepsis: No Severe Sepsis: No  Pneumonia: No  COVID Positive: No    Last COVID test date: 6/14    Heme: acute blood loss anemia, resolved), start chemoDVT  prophylaxis regimen with lovenox. SCD's for mechanical prophylaxis  On DVT Prophylaxis: No: Acute blood loss anemia: No Coagulopathy: No     Endocrine: no current issues.    Hyperglycemia -- No  Insulin regimen -- No  Synthroid -- No    Neuro/Sedation: chest wall analgesia: Tylenol 1gm q8 + oxycodone 10mg  po q6hrs + Toradol 15mg  IV q8hr x24hrs. Start lidoderm patch for transdermal analgesic management.  . Neurocognitive medication regimen: continue home Sertraline. Resume home neuropathic regimen.   Encephalopathy: No Coma: NoBrain compression: No    Exts/Consults/Other: ICU prophylaxis regimen  - APS evaluation and management  - neurospine evaluation and management.   - trauma sx evaluation and management. Tertiary evaluation pending  - PT/OT evaluation for dispo recommendations   - neurospine evaluation and recommendations.     Social/Family/Dispo: family update  Family present at bedside today?  No    Attending Attestation:    This is a moderate complexity patient.     [X]  This patient is critically ill with at least 1 organ system failure associated with a high probability of life threatening deterioration. The care I have delivered involved high complexity decision making to assess, manipulate, and support vital system function(s), to treat the vital organ system failure and/or prevent further deterioration of the patient's condition. All nursing documentation, laboratory data, test results, and radiographs were reviewed and interpreted by me during multidisciplinary rounds. I have established the management plan for this patient's critical illness and have been immediately available to assist with patient care. My documented total critical care time  reflects my own patient care time and does not include teaching or procedure time.     Admission diagnoses for this admission include:  Acute Blood Loss Anemia, Acute Pulmonary Insufficiency Following Surgery and Electrolyte Imbalance hypoMagnesium    Critical care  time since midnight/this 24 hour period: Personal time spent - 20 minutes.  This critical care time is exclusive of any time for separately billable procedures and time spent teaching    Signed by: Melton Krebs, MD  as of 10/29/2019 at 8:30 AM

## 2019-10-29 NOTE — Progress Notes (Signed)
[facility] Critical Care Progress Note    Interval History:   -No acute events   Patient laying comfortable in bed w/ c-collar in place, reporting pain well controlled under current regimen    LOS: 5 days     Current Vitals BP 102/50 (BP Location: Right arm)    Pulse 73    Temp 37.2 C (99 F) (Foley)    Resp 14    Ht 1.778 m ('5\' 10"' )    Wt 120 kg (264 lb 8.8 oz)    LMP  (LMP Unknown)    SpO2 95%    BMI 37.96 kg/m    VS Range (24 hrs) BP: (102-129)/(45-74)   Temp:  [36.6 C (97.9 F)-37.7 C (99.9 F)]   Temp src: Foley (06/19 0400)  Heart Rate:  [68-89]   Resp:  [12-18]   SpO2:  [91 %-100 %]   Weight:  [120 kg (264 lb 8.8 oz)]      CPAP: 13.2 cmH2O Comment: auto titrate 10-17cmH20 range  O2 Bleed-in: 6 lpm  NIV Interface : Patient Own  Nasal Barrier Applied : Yes  NIV Barrier Skin Assesement : clean, dry, intact      Nursing pain score: Last Nursing documented pain:  0-10 Scale: 5 (10/30/19 0415)  NVPS (Critical Care) - Score: 0 (10/30/19 0500)      Intake/Output:  Date 10/29/19 0700 - 10/30/19 0659 10/30/19 0700 - 10/31/19 0659   Shift 6789-3810 1500-2259 2300-0659 24 Hour Total 0700-1459 1500-2259 2300-0659 24 Hour Total   INTAKE   P.O. 470 200 240 910         P.O. 470 200 240 910       I.V.(mL/kg/hr) 139.6(0.2) 668(0.7) 657.4 1464.9         Volume (mL) (dextrose 5 % - sodium chloride 0.45 %) 139.6 668 657.4 1464.9       Shift Total(mL/kg) 609.6(5.3) [id number]) 897.4(7.5) 2374.9(19.8)       OUTPUT   Urine(mL/kg/hr) 1730(1.9) 780(0.8) 490 3000         Urine Output (ml) (Urethral Catheter Foley (Temperature probe)) 1730 314 682 8625       Shift Total(mL/kg) 1730(15) 780(6.5) 490(4.1) 3000(25)       NET -1120.5 88 407.4 -625.1       Weight (kg) 115.1 120 120 120 120 120 120 120        Active Hospital Medications:   Scheduled Meds  budesonide  0.5 mg Nebulization 2 times per day    atorvastatin  40 mg Oral Daily    enoxaparin  30 mg Subcutaneous 2 times per day    lidocaine  1 patch Transdermal Daily    ketorolac  15  mg Intravenous Q6H    oxyCODONE  5 mg Oral Q6H    bisacodyl  10 mg Rectal Daily    polyethylene glycol  17 g Oral 2 times per day    gabapentin  300 mg Oral TID    acetaminophen  1,000 mg Oral Q8H    bacitracin zinc-polymyxin B sulfate   Topical 2 times per day    pantoprazole  20 mg Oral QAM    sertraline  100 mg Oral Daily    senna  2 tablet Oral 2 times per day    ipratropium-albuterol  3 mL Nebulization Q6H      Continuous Infusions  dextrose 5 % and 0.45% NaCl 100 mL/hr (10/30/19 0459)      PRN Meds ondansetron, albuterol, sodium chloride, dextrose  Physical Exam by Systems:  BP 102/50 (BP Location: Right arm)    Pulse 73    Temp 37.2 C (99 F) (Foley)    Resp 14    Ht 1.778 m ('5\' 10"' )    Wt 120 kg (264 lb 8.8 oz)    LMP  (LMP Unknown)    SpO2 95%    BMI 37.96 kg/m     General: NAD C collar In place  CVS: RRR  PULM: CTAB  ABD: Soft, NT, ND, +BS   EXT: trace edema, Distal pulses 2+ bilaterally   NEURO AAOX3, grossly normal   C-Collar Cleared:  no       LABS   BMP:   Recent Labs   Lab 10/30/19  0025 10/28/19  2314 10/27/19  2336 10/26/19  0042 10/25/19  2048   Sodium 137 137 137   < >  --    Potassium 4.3 4.9 4.3   < >  --    Chloride 97 99 100   < >  --    CO2 32* 32* 31*   < >  --    Anion Gap 8 6* 6*   < >  --    UN 27* 17 13   < >  --    Creatinine 0.82 0.73 0.62   < > 0.98*   Glucose 128* 127* 113*   < >  --    Calcium 9.0 8.8 8.4*   < >  --    Albumin  --   --   --   --  3.8   Phosphorus 3.4 3.5 2.3*   < >  --    Magnesium 2.5 2.4 2.1   < >  --     < > = values in this interval not displayed.     CBC:   Recent Labs   Lab 10/30/19  0025 10/28/19  2314 10/27/19  2336   WBC 15.8* 17.8* 14.1*   Hemoglobin 10.9* 11.4 11.3   Hematocrit 36 37 36   Platelets 257 231 229     Liver Panel:   Recent Labs   Lab 10/26/19  2319 10/25/19  2048   Albumin  --  3.8   Total Protein  --  6.5   Bilirubin,Total  --  0.2   Bilirubin,Direct  --  <0.2   AST  --  57*   ALT  --  39*   Alk Phos  --  157*   Amylase 27*  50   Lipase 12* 59     ABG:   Recent Labs   Lab 10/25/19  2048   Lactate 1.4     Other labs: No results for input(s): INR, CRP, ESR, TROP, CK, MCKMB, BNP in the last 72 hours.    No components found with this basename: PT, APT       Radiology results   EKG 12 lead    Result Date: 10/28/2019  Sinus rhythm Prolonged PR interval Low voltage, precordial leads Borderline T abnormalities, anterior leads    MR cervical spine without contrast    Result Date: 10/28/2019  Type III odontoid fracture, with 2 mm posterior displacement of the odontoid fracture fragment, extension into the right C2 lateral mass, and disruption of the posterior longitudinal ligament. Injury to the anterior longitudinal ligament may also be present. END OF IMPRESSION I have personally reviewed the images and the Resident's/Fellow's interpretation and agree with or edited the findings. [facility] submits this  DICOM format image data and final report to the [facility], an independent secure electronic health information exchange, on a reciprocally searchable basis (with patient authorization) for a minimum of 12 months after exam date.       ASSESSMENT: [name] is a 75 y.o. female L2T fall from 15 stairs on 6/15    Type III odontoid fx  Scalp lac   Periaortic fat stranding   ?lumbar vein extrav  Splenic lac,   ?Pancreatic injury,    L 2, 5-6 rib fx   Small extrapleural hematoma       Currently Active/Followed Hospital Problems:  Principal Problem:    Fall from 15 stairs 6/15 type III odontoid fx, scalp lac, ?lumbar vein extrav, periaortic fat stranding, splenic lac, ?pancreatic injury, L 2, 5-6 rib fx, small extrapleural hematoma    Patient Active Problem List   Diagnosis Code    Severe Obstructive sleep apnea G47.33    Pulmonary nodule R91.1    CAD (coronary artery disease) I25.10    Prediabetes R73.03    Gastritis K29.70    Obesity E66.9    Dysthymia F34.1    Facial fracture due to fall S02.92XA, [id number]    Orbital floor  fracture S02.30XA    Restless leg syndrome G25.81    Health care maintenance Z00.00    Aortic stenosis I35.0    Anxiety and depression F41.9, F32.9    COPD (chronic obstructive pulmonary disease) J44.9    Osteopenia M85.80    Environmental allergies Z91.09    Fall from 15 stairs 6/15 type III odontoid fx, scalp lac, ?lumbar vein extrav, periaortic fat stranding, splenic lac, ?pancreatic injury, L 2, 5-6 rib fx, small extrapleural hematoma S12.120A       PLAN BY SYSTEMS:   Pulm: Rib Fx's  - Home CPAP brought in for use:   - Wean NC for Sat >92%  - Encourage pulm toilet w/ IS/C/DB  - Continue Pulmicort and Duonebs (in place of home Trelegy Ellipta), and PRN albulterol    CVS:  Hemodynamically appropriate  - consider resuming home statin  -Hold plavix until discharge  -Q4 vitals    BP  Min: 102/50  Max: 129/74  Pulse  Avg: 79.1  Min: 68  Max: 89     Renal/E/F: no acute issues  -Normal renal fxn  -MIVF started D51/2NS  -Stable lytes  -Adequate UOP   - Foley placed for strict I&O    Intake/Output Summary (Last 24 hours) at 10/30/2019 0602  Last data filed at 10/30/2019 0459  Gross per 24 hour   Intake 2374.93 ml   Output 3000 ml   Net -625.07 ml           Recent Labs   Lab 10/30/19  0025 10/28/19  2314   UN 27* 17   Creatinine 0.82 0.73   Potassium 4.3 4.9       GI/Nutrition:   -  regular diet:   - Possible DHT if not tolerating diet  -  Bowel reg (miralax, senna, dulcolax scheduled)  - GI Ppx: On home PPI    ID: No infectious concerns  - afebrile, leukocytosis WBC down trending 15.8 (17.8)   - Cx if becomes febrile  - COVID neg 6/14  Temp (24hrs), Avg:37 C (98.6 F), Min:36.6 C (97.9 F), Max:37.7 C (99.9 F)      Recent Labs   Lab 10/30/19  0025 10/28/19  2314 10/27/19  2336   WBC 15.8* 17.8* 14.1*  Heme:   Recent Labs   Lab 10/30/19  0025 10/28/19  2314 10/27/19  2336   Hematocrit 36 37 36     -Chemodvt ppx: lovenox   Transfusion? Is Hct <21? no    Endo: No active issues    Neuro/pain:  Acute pain  -  Serratus Block d/c due to good pain control  -Tylenol 1gm q8 + oxycodone 63m po q6hrs + Toradol 156mIV q8hr x24hrs.   - Lidoderm patch for transdermal analgesic management.  -Tylenol ATC  -Continue Zoloft and home gabapentin    Integument: Routine wound and ICU care    Musculoskeletal: Type III odontoid fx  - MRI completed  -Neurospine eval and reccs  -Continue [place] J collar 4-6 weeks, AAT  -PT/OT: recc rehab    Author: AyArizona ConstableMD  as of: 10/30/2019  at: 6:02 AM

## 2019-10-29 NOTE — Progress Notes (Addendum)
Acute Pain Service Adult Daily Progress Note for Inpatients  Hospital-day:  LOS: 4 days     Closed odontoid fracture with type III morphology, initial encounter    Overnight Issues: NAE. IS ~500. L ESP  Catheter was pulled yesterday so that she could obtain a cervical MRI per NS, which revealed an odontoid fx. On rounds this morning, [name] is complaining primarily of abdominal pain with a secondary complaint of left shoulder pain. She has significant pain with movement and minimal pain at rest. She is awake and easily communicates but is terse in her conversation.    Pain Scores:   Rest:0/10   Cough/Move:10/10   24hr High:10/10    Last Nursing documented pain:  0-10 Scale: 3 (10/29/19 0800)      Significant 24hr Events:  Fever: no   Anticoagulation: holding plavix through discharge, on ppx lovenox    Vitals:  Patient Vitals for the past 24 hrs:   BP Temp Temp src Pulse Resp SpO2   10/29/19 0813      94 %   10/29/19 0800 129/74   89 14 94 %   10/29/19 0600 125/74   80 17 94 %   10/29/19 0400 141/74 37 C (98.6 F) TEMPORAL 77 15 94 %   10/29/19 0200 103/50   71 14 96 %   10/29/19 0000 105/56 36.5 C (97.7 F) TEMPORAL 86 20 93 %   10/28/19 2200 111/55   85 16 97 %   10/28/19 2000 110/52 36.9 C (98.4 F) Axillary 73 15 95 %   10/28/19 1900 (!) 98/43   77 15 92 %   10/28/19 1800 (!) 103/44   73 15 94 %   10/28/19 1700 125/59   70 17 98 %   10/28/19 1600 112/53 36.5 C (97.7 F) Axillary 72 14 97 %   10/28/19 1400 (!) 98/48   70 14 96 %   10/28/19 1300 (!) 110/48   82 14 99 %   10/28/19 1200  36.8 C (98.2 F) Axillary 84 18 98 %   10/28/19 1100 103/56   78 13 96 %   10/28/19 1000 114/53   77 15 92 %     O2 Flow Rate: 4 L/min (10/29/19 0813)       Mental Status: alert, oriented to person, place, and time, appropriate in conversation  Breathing deeply at rest without difficulty  ttp over the left sub-acromial chest, no pain in left posterior chest with deep breathing    IS Volume: ~500     Cough Quality: weak    Intake/Output last 3 shifts:  I/O last 3 completed shifts:  06/17 0700 - 06/18 0659  In: 475.8 (4.1 mL/kg) [P.O.:220; IV Piggyback:255.8]  Out: 50 (0.4 mL/kg) [Urine:50 (0 mL/kg/hr)]  Net: 425.8  Weight: 115.1 kg   Intake/Output this shift:  I/O this shift:  06/18 0700 - 06/18 1459  In: 120 (1 mL/kg) [P.O.:120]  Out: - (0 mL/kg)   Net: 120  Weight: 115.1 kg     Allergies:   Allergies   Allergen Reactions    Morphine Hives     Has tolerated Percocet       PRN Medications: (*Numbers appearing after the prn medications listed below indicate doses used since the patient was last seen by this service.)   oxyCODONE  5 mg Oral Q4H PRN    Or    oxyCODONE  10 mg Oral Q4H PRN    ondansetron  4 mg Intravenous  Q6H PRN    sodium chloride  10 mL Intravenous PRN    And    sodium chloride  20 mL Intravenous PRN    albuterol  2.5 mg Nebulization Q6H PRN    sodium chloride  0-500 mL/hr Intravenous PRN    dextrose  0-500 mL/hr Intravenous PRN       IV Medications:       Scheduled Medications:    bisacodyl  10 mg Rectal Daily    polyethylene glycol  17 g Oral 2 times per day    oxyCODONE  10 mg Oral Q6H    gabapentin  300 mg Oral TID    enoxaparin  40 mg Subcutaneous Daily @ 2100    acetaminophen  1,000 mg Oral Q8H    budesonide  0.5 mg Nebulization 2 times per day    bacitracin zinc-polymyxin B sulfate   Topical 2 times per day    pantoprazole  20 mg Oral QAM    sertraline  100 mg Oral Daily    senna  2 tablet Oral 2 times per day    ipratropium-albuterol  3 mL Nebulization Q6H       Lab Results:   All labs in the last 24 hours:   Recent Results (from the past 24 hour(s))   Phosphorus    Collection Time: 10/28/19 11:14 PM   Result Value Ref Range    Phosphorus 3.5 2.7 - 4.5 mg/dL   Magnesium    Collection Time: 10/28/19 11:14 PM   Result Value Ref Range    Magnesium 2.4 1.6 - 2.5 mg/dL   Basic metabolic panel    Collection Time: 10/28/19 11:14 PM   Result Value Ref Range    Glucose 127 (H)  60 - 99 mg/dL    Sodium 137 133 - 145 mmol/L    Potassium 4.9 3.3 - 5.1 mmol/L    Chloride 99 96 - 108 mmol/L    CO2 32 (H) 20 - 28 mmol/L    Anion Gap 6 (L) 7 - 16    UN 17 6 - 20 mg/dL    Creatinine 0.73 0.51 - 0.95 mg/dL    GFR,Caucasian 82 *    GFR,Black 94 *    Calcium 8.8 8.6 - 10.2 mg/dL   CBC and differential    Collection Time: 10/28/19 11:14 PM   Result Value Ref Range    WBC 17.8 (H) 4.0 - 10.0 THOU/uL    RBC 4.0 3.90 - 5.20 MIL/uL    Hemoglobin 11.4 11.2 - 15.7 g/dL    Hematocrit 37 34.00 - 45.00 %    MCV 93 79.0 - 95.0 fL    MCH 29 26 - 32 pg    MCHC 31 (L) 32 - 36 g/dL    RDW 13.2 11.7 - 14.4 %    Platelets 231 160 - 370 THOU/uL    Seg Neut % 89.7 %    Lymphocyte % 2.5 %    Monocyte % 6.9 %    Eosinophil % 0.0 %    Basophil % 0.3 %    Neut # K/uL 16.0 (H) 1.6 - 6.1 THOU/uL    Lymph # K/uL 0.5 (L) 1.2 - 3.7 THOU/uL    Mono # K/uL 1.2 (H) 0.2 - 0.9 THOU/uL    Eos # K/uL 0.0 0.0 - 0.4 THOU/uL    Baso # K/uL 0.1 0.0 - 0.1 THOU/uL    Nucl RBC % 0.0 0.0 - 0.2 /100 WBC  Nucl RBC # K/uL 0.0 0.0 - 0.0 THOU/uL    IMM Granulocytes # 0.1 (H) 0.0 - 0.0 THOU/uL    IMM Granulocytes 0.6 %         Lab results: 10/28/19  2314   WBC 17.8*   Hemoglobin 11.4   Hematocrit 37   RBC 4.0   Platelets 231           Lab results: 10/25/19  2048   Protime 11.6   INR 1.0           Assessment/Plan: 26F w/ numerous injuries including left posterior 2, 5, 6 rib fractures for which we placed a left sided T5 erector spinae catheter 6/15 that was removed on 6/17 to facilitate an MRI. She was restarted on home gabapentin yesterday, finished a short course of toradol, and has been well maintained on oxycodone and acetaminophen.  She does not appear to have much pain from her rib fractures today and is able to take deep breaths without increased pain.  In light of her cervical injuries, lack of pain from her rib fractures, and the difficulty of positioning her during the initial catheter placement, we do not think that replacement of the  catheter would yield a net benefit at this time.    Recommendations:  - continue acetaminophen  - continue scheduled NSAIDs  - continue gabapentin  - continue oxycodone prn or equivalent  - bowel regimen while taking opioids  - IS, aggressive pulmonary toilet  - please contact APS with worsening decline if respiratory function or any additional questions or concerns.    Shelda Altes, MD  Anesthesia Resident, CA-3  12:42 PM 10/29/2019        Attending Attestation:   I saw and evaluated the patient. I agree with the resident's/APP's findings and plan of care as documented above.  Tommi Emery, MD  10/29/19  12:45 PM

## 2019-10-29 NOTE — Progress Notes (Signed)
In-patient Visit  [facility]  [phone]    Patient name: [name]     O&P was paged to assess fit of collar. Fit was not appropriate, collar was switched for a super short.    Kohl Polinsky B. Sydell Axon, [place]   [phone]

## 2019-10-29 NOTE — Progress Notes (Signed)
Report Given To  Woodfin Ganja, RN   Afebrile. VSS. A&Ox3, following commands. PT ordered. Patient ate about 25% of her breakfast. APS was by this AM, talking to ICU team about pain management. Patient spontaneously urinated. OOBTC at 1030.   Please refer to Docflow/MAR/Results for more information.   Lyndle Herrlich, RN      Descriptive Sentence / Reason for Admission   [name] is a 74 y.o. female w/pmhx CAD s/p CABG 2012 in [place] and recent stenting angio 2015 on plavix, COPD, OSA, DM who presented as level II Trauma after fall down 12 stairs at nephew's house during family party.     Injuries:     - right scalp hematoma  - left parietal scalp laceration  - type 3 odontoid fx  - left rib fracture  - splenic laceration    Vascular surgery consulted for assessment and management.         Active Issues / Relevant Events   615N: Admit to [place]. Hypotensive 60/30s with AMS; BP then 80/40s after 2 x 1 L LR fluid boluses given. Given 1 unit PRBC and 1 unit platelets. EKG with trops sent.  R fem triple lumen CVC placed and L fem a-line placed. STAT CTA head/chest/abdomen completed. COVID NEG.  6/15D: Echo done 1015 Erector Spinae Block placed by APS 1550  6/16N: Hypertensive SBPs 170-180s. Given prn 0.5 dilaudid x 1 and 5 mg oxy with no therapeutic effect. Provider notified; pt given 1 time dose 0.25 dilaudid, 10 mg oxy and bupivicaine bolus on PCEA. EKG completed.   6/17: APS Block d/c for MRI. MRI C-Spine completed.       To Do List  Q2 VS  Q1 I&O  Q4 Neuro checks  Regular Diet  Daily labs      Anticipatory Guidance / Discharge Planning  Transfer to floor when medically stable.    Foley Lot# [id number]  Daughter [name] took Gold ring and opal earrings home 6/15

## 2019-10-29 NOTE — Progress Notes (Signed)
Physical Therapy Initial Evaluation    Discharge Recommendations:  [name] is currently functioning below her baseline considering her current diagnosis and current functional level. Anticipate patient will need further therapy services upon discharge. PT Discharge Recommendations: Rehab. [name] is currently dependent for transfers at this time.    History of Present Admission: Admitted s/p fall down a flight of stairs. Injuries include:  - right scalp hematoma  - left parietal scalp laceration  - type 3 odontoid fx  - left rib fracture  - splenic laceration    Past Medical History:   Diagnosis Date    Allergic rhinitis 09/30/2014    Arthritis     Asthma     CAD (coronary artery disease)     DVT (deep venous thrombosis)     post-op after CABG, on anticoagulation for ~2 years    PE (pulmonary embolism)     post-op after CABG, on anticoagulation for ~2 years    Prediabetes     Tubular adenoma 03/2015    colonoscopy         Past Surgical History:   Procedure Laterality Date    ACHILLES TENDON SURGERY      APPENDECTOMY      CHOLECYSTECTOMY      CORONARY ANGIOPLASTY WITH STENT PLACEMENT      CORONARY ARTERY BYPASS GRAFT  2013    triple     CYST REMOVAL      both thumbs    PR REPAIR EYE BLOWOUT,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    Procedure: ORBIT ORIF;  Surgeon: Jannett Celestine, DDS;  Location: [facility] MAIN OR;  Service: OMFS    TONSILLECTOMY AND ADENOIDECTOMY         Personal factors affecting treatment/recovery:   Advanced age (>=65 yo)   Caregiver limitations   History of falls   Lives alone    Comorbidities affecting treatment/recovery:   Arthritis    Clinical presentation:   evolving due to: pain    Patient complexity:     moderate level as indicated by above stability of condition, personal factors, environmental factors and comorbidities in addition to their impairments found on physical exam.       10/29/19 1125   Prior Living    Prior Living Situation Reported  by patient   Lives With [facility] From Personal care attendant  (aides Tu/Thurs for 5 hours through [employer])   Type of Home Apartment   # Steps to Enter Home 0   # Of Steps In Home 0   Additional Comments Pt's daughter in room providing additional details. Pt has aide service however they are unreliable at times. She does get 7 meals a week provided through [employer] as well.   Prior Function Level   Prior Function Level Reported by patient   Transfers Independent   Transfer Devices None   Walking Independent   Walking assistive devices used None   Stair negotiation Independent   Additional Comments Pt has been independent in all functional mobility including driving. History of one other fall (besides the one that preceeded this admission) about 4 years ago when she turned quickly and tripped over a stool and fractured her eye.    PT Tracking   PT TRACKING PT Assigned   Visit Number   Visit Number [facility]) / Treatment Day [facility]) 1   Visit Details [facility]   Visit Type [facility]) Evaluation   Precautions/Observations   Precautions used Yes   Brace Applied Yes  Brace Type Neck brace   LDA Observation Monitors   Vital Signs Response with Therapy Stable   Isolation Precautions None   Was patient wearing a mask? No   PPE worn by Probation officer Gloves;Goggles;Mask   Fall Precautions General falls precautions   Other Pt's daughter present and supportive in room.   Pain Assessment   *Is the patient currently in pain? Yes   Pain (Before,During, After) Therapy During   0-10 Scale 10   Pain Location Neck   Pain Intervention(s) Repositioned;Refer to nursing for pain management   Additional comments Pain increased significantly with trunk movement.   Vision    Current Vision Wears corrective lenses   Additional Comments Glasses not donned today due to swelling around eyes. R eye more swollen that L. Unable to fully open R eye today but able to see inferior sclera and bottom rim of iris. Able to open L eye ~50% but unable vs unwilling to  maintain it open during session.   Cognition   Cognition Tested   Arousal/Alertness Delayed responses to stimuli   Orientation Oriented to person;Oriented to place   Following Commands Follows simple commands with increased time;Follows simple commands with repetition   Additional Comments Pt very sleepy, 1-2 word answers at a time, delayed answers and movement to questions/commands.   UE Assessment   UE Assessment Full range RUE PROM;Full range LUE PROM   LUE Strength    Shoulder Flex 2+/5    Elbow Flex 3+/5      Elbow Ext 3/5     Grip Strength 3+/5    RUE Strength    Shoulder Flex 2+/5    Elbow Flex 3+/5      Elbow Ext 3/5     Grip Strength 3+/5    LE Assessment   LE Assessment Full PROM RLE;Full PROM LLE   Strength LLE   HIP FLEXION 3-/5   Knee Flexion 3+/5   Knee Extension 3+/5   Ankle Dorsiflexion 4/5   Strength RLE   HIP FLEXION 3-/5   Knee Flexion 3+/5    Knee Extension 3+/5   Ankle Dorsiflexion 4/5   Sensation   Sensation No apparent deficit   Bed Mobility   Bed mobility Not tested   Additional comments Pt hovered OOB to chair with nursing assist earlier this morning.   Transfers   Transfers Tested   Sit to Stand 2 person assist;Maximum    Stand to sit 2 person assist;Maximum    Additional comments Gently worked through ROM of all four extremities as well as anterior weight shifting in chair before attempting standing. Attempted sit to stand with 2 person assist and bilateral hand held support x3 reps. Pt with effort and technique but only able to achieve ~25% stand at this time. Significant pain noted.   Therapeutic Exercises   Additional comments Performed a few reps of ROM to all four extremities with ROM and strength assessment. Also initiated anterior weight shifts/pull forwards in recliner chair x5 reps with intermittent assist at back.   Balance   Balance Tested   Sitting - Static Minimum assist   Sitting - Dynamic Min assist   Functional Outcome Measures   Functional Outcome Measures Yes   PT AM-PAC  Mobility   Turning over in bed? 1   Sitting down on and standing up from a chair with arms? 1   Moving from lying on back to sitting on the side of the bed? 1   Moving to and from  a bed to a chair? 1   Need to walk in hospital room? 1   Climbing 3 - 5 steps with a railing? 1   Total Raw Score 6   Standardized Score - Calculated 23.55   % Functional Impairment - Calculated 100%   Physical Mobility Scale (PMS):    Physical Mobility - Supine to right sidelying 0   Physical Mobility - Supine to left sidelying 0   Physical Mobility - Supine to sit 0   Physical Mobility - Sitting balance 1   Physical Mobility - Sitting to standing 0   Physical Mobility - Standing to sitting 0   Physical Mobility - Standing balance 0   Physical Mobility - Transfers 0   Assessment   Brief Assessment Appropriate for skilled therapy   Problem List Pain contributing to impairment;Impaired functional mobility   Patient / Family Goal Feel better   Plan/Recommendation   Treatment Interventions Assess functional mobility;Restorative PT   PT Frequency 2-4 x/wk   Mobility Recommendations hover OOB to chair   Referral Recommendations OT;PM&R Consult   Discharge Recommendations Rehab   PT Discharge Equipment Recommended To be determined   Assessment/Recommendations Reviewed With: Patient;Nursing;Midlevel provider;Family   Next PT Visit Attempted standing with 2 person assist vs [place] pending pain.   Time Calculation   PT Untimed Codes 33   PT Unbilled Time 0   PT Total Treatment 33   PT Total Treatment 33   Plan and Onset date   Plan of Care Date 10/29/19   Onset Date 10/25/19   Treatment Start Date 10/29/19     Please page with any questions or concerns.    [name], PT, DPT  [facility] 2002

## 2019-10-30 LAB — URINE MICROSCOPIC (IQ200): Bacteria,UA: NONE SEEN

## 2019-10-30 LAB — CBC AND DIFFERENTIAL
Baso # K/uL: 0.1 10*3/uL (ref 0.0–0.1)
Baso # K/uL: 0.1 10*3/uL (ref 0.0–0.1)
Basophil %: 0.3 %
Basophil %: 0.5 %
Eos # K/uL: 0.3 10*3/uL (ref 0.0–0.4)
Eos # K/uL: 0.3 10*3/uL (ref 0.0–0.4)
Eosinophil %: 1.6 %
Eosinophil %: 2.8 %
Hematocrit: 33 % — ABNORMAL LOW (ref 34–45)
Hematocrit: 36 % (ref 34–45)
Hemoglobin: 10.9 g/dL — ABNORMAL LOW (ref 11.2–15.7)
Hemoglobin: 9.9 g/dL — ABNORMAL LOW (ref 11.2–15.7)
IMM Granulocytes #: 0.1 10*3/uL — ABNORMAL HIGH (ref 0.0–0.0)
IMM Granulocytes #: 0.1 10*3/uL — ABNORMAL HIGH (ref 0.0–0.0)
IMM Granulocytes: 0.4 %
IMM Granulocytes: 0.9 %
Lymph # K/uL: 1.1 10*3/uL — ABNORMAL LOW (ref 1.2–3.7)
Lymph # K/uL: 1.2 10*3/uL (ref 1.2–3.7)
Lymphocyte %: 10.3 %
Lymphocyte %: 6.6 %
MCH: 28 pg (ref 26–32)
MCH: 28 pg (ref 26–32)
MCHC: 30 g/dL — ABNORMAL LOW (ref 32–36)
MCHC: 30 g/dL — ABNORMAL LOW (ref 32–36)
MCV: 93 fL (ref 79–95)
MCV: 93 fL (ref 79–95)
Mono # K/uL: 0.8 10*3/uL (ref 0.2–0.9)
Mono # K/uL: 1.4 10*3/uL — ABNORMAL HIGH (ref 0.2–0.9)
Monocyte %: 7.3 %
Monocyte %: 8.9 %
Neut # K/uL: 13 10*3/uL — ABNORMAL HIGH (ref 1.6–6.1)
Neut # K/uL: 8.7 10*3/uL — ABNORMAL HIGH (ref 1.6–6.1)
Nucl RBC # K/uL: 0 10*3/uL (ref 0.0–0.0)
Nucl RBC # K/uL: 0 10*3/uL (ref 0.0–0.0)
Nucl RBC %: 0 /100 WBC (ref 0.0–0.2)
Nucl RBC %: 0 /100 WBC (ref 0.0–0.2)
Platelets: 231 10*3/uL (ref 160–370)
Platelets: 257 10*3/uL (ref 160–370)
RBC: 3.6 MIL/uL — ABNORMAL LOW (ref 3.9–5.2)
RBC: 3.9 MIL/uL (ref 3.9–5.2)
RDW: 13.4 % (ref 11.7–14.4)
RDW: 13.6 % (ref 11.7–14.4)
Seg Neut %: 78.2 %
Seg Neut %: 82.2 %
WBC: 11.1 10*3/uL — ABNORMAL HIGH (ref 4.0–10.0)
WBC: 15.8 10*3/uL — ABNORMAL HIGH (ref 4.0–10.0)

## 2019-10-30 LAB — BASIC METABOLIC PANEL
Anion Gap: 8 (ref 7–16)
Anion Gap: 12 (ref 7–16)
CO2: 32 mmol/L — ABNORMAL HIGH (ref 20–28)
CO2: 28 mmol/L (ref 20–28)
Calcium: 9 mg/dL (ref 8.6–10.2)
Calcium: 8.9 mg/dL (ref 8.6–10.2)
Chloride: 97 mmol/L (ref 96–108)
Chloride: 99 mmol/L (ref 96–108)
Creatinine: 0.82 mg/dL (ref 0.51–0.95)
Creatinine: 0.6 mg/dL (ref 0.51–0.95)
GFR,Black: 82 *
GFR,Black: 105 *
GFR,Caucasian: 71 *
GFR,Caucasian: 91 *
Glucose: 128 mg/dL — ABNORMAL HIGH (ref 60–99)
Glucose: 179 mg/dL — ABNORMAL HIGH (ref 60–99)
Lab: 27 mg/dL — ABNORMAL HIGH (ref 6–20)
Lab: 21 mg/dL — ABNORMAL HIGH (ref 6–20)
Potassium: 4.3 mmol/L (ref 3.3–5.1)
Potassium: 4.4 mmol/L (ref 3.3–5.1)
Sodium: 137 mmol/L (ref 133–145)
Sodium: 139 mmol/L (ref 133–145)

## 2019-10-30 LAB — URINALYSIS REFLEX TO CULTURE
Glucose,UA: NEGATIVE mg/dL
Ketones, UA: NEGATIVE
Nitrite,UA: NEGATIVE
Specific Gravity,UA: 1.015 (ref 1.002–1.030)
pH,UA: 7 (ref 5.0–8.0)

## 2019-10-30 LAB — PHOSPHORUS
Phosphorus: 3.4 mg/dL (ref 2.7–4.5)
Phosphorus: 3.9 mg/dL (ref 2.7–4.5)

## 2019-10-30 LAB — MAGNESIUM
Magnesium: 2.5 mg/dL (ref 1.6–2.5)
Magnesium: 2.2 mg/dL (ref 1.6–2.5)

## 2019-10-30 MED ORDER — IBUPROFEN 600 MG PO TABS *I*
600.0000 mg | ORAL_TABLET | Freq: Four times a day (QID) | ORAL | Status: DC | PRN
Start: 2019-10-30 — End: 2019-11-02
  Administered 2019-10-31 – 2019-11-01 (×2): 600 mg via ORAL
  Filled 2019-10-30 (×5): qty 1
  Filled 2019-10-30: qty 3
  Filled 2019-10-30: qty 1

## 2019-10-30 MED ORDER — TAMSULOSIN HCL 0.4 MG PO CAPS *I*
0.4000 mg | ORAL_CAPSULE | Freq: Every evening | ORAL | Status: DC
Start: 2019-10-30 — End: 2019-11-09
  Administered 2019-10-30 – 2019-11-08 (×10): 0.4 mg via ORAL
  Filled 2019-10-30 (×12): qty 1

## 2019-10-30 NOTE — Progress Notes (Addendum)
Nursing note (2300-0730):  AVSS - CPAP O/N.  Pt A&Ox3; PERRL 3-4 mm, brisk; MAE; FC; able to communicate needs.  D5 1/2 NS @ 100 mL/hr.  Pain controlled O/N w/ ATC medications.  UOP adequate.  No BM O/N.  WBC 15.8(17.8).  No phone calls or visitors.  Slept well between interventions.     Please see doc flowsheets & MAR for complete assessments, VS, & medication administration.     Leavy Cella, RN     Report Given To    K. Lovena Le, RN    Descriptive Sentence / Reason for Admission   [name] is a 74 y.o. female w/pmhx CAD s/p CABG 2012 in [place] and recent stenting angio 2015 on plavix, COPD, OSA, DM who presented as level II Trauma after fall down 12 stairs at nephew's house during family party.     Injuries:     - right scalp hematoma  - left parietal scalp laceration  - type 3 odontoid fx  - left rib fracture  - splenic laceration    Vascular surgery consulted for assessment and management.         Active Issues / Relevant Events   615N: Admit to [place]. Hypotensive 60/30s with AMS; BP then 80/40s after 2 x 1 L LR fluid boluses given. Given 1 unit PRBC and 1 unit platelets. EKG with trops sent.  R fem triple lumen CVC placed and L fem a-line placed. STAT CTA head/chest/abdomen completed. COVID NEG.  6/15D: Echo done 1015 Erector Spinae Block placed by APS 1550  6/16N: Hypertensive SBPs 170-180s. Given prn 0.5 dilaudid x 1 and 5 mg oxy with no therapeutic effect. Provider notified; pt given 1 time dose 0.25 dilaudid, 10 mg oxy and bupivicaine bolus on PCEA. EKG completed.   6/17: APS Block d/c for MRI. MRI C-Spine completed.   6/18: MIVF. Foley. Desatted x4 high 70s- chest xray and CPAP. 10mg  oxy given- pt lethargic.      To Do List  Q4 VS  Q1 I&O  Q4 Neuro checks  Regular Diet- Calorie counts started 6/18  Daily labs      Anticipatory Guidance / Discharge Planning  Transfer to floor when medically stable.    Foley Lot# [id number]  Daughter [name] took Gold ring and opal earrings home 6/15

## 2019-10-30 NOTE — Progress Notes (Signed)
Burn / Trauma ICU Attending Plan:    I have seen and evaluated the patient personally on rounds with the resident/Fellow/PA/NP ICU team. I personally examined the patient.  All nursing documentation, laboratory data, test results, and radiographs were reviewed and interpreted by me.I agree with the database, findings, and plan of care recorded in the above note. I have reviewed and edited the above note to reflect my findings as well.  I have established the management plan for this patient's critical illness with the ICU team and have been immediately available to assist with patient care. Additional details are highlighted are within my note below.     24hrs: no acute events    Catalogue of Injuries    - right scalp hematoma  - left parietal scalp laceration  - type 3 odontoid fx  - left rib fracture  - splenic laceration    Respiratory: stable 4L supplemental oxygen needs: home CPAP settings overnight, aggressive pulmonary toileting with IS. Asthma medication resume: albuterol, pulmicort, duonebs.  Acute Pulmonary Insufficiency -- Yes   Acute Respiratory Failure: No  Ventilator dependent: No    CV: hemodynamically apppropriate. Adequate perfusion. Hold Plavix until discharge. q4 vitals.      Shock: No Dysrrythmia/Arrhythmia: No    GU/Fluids: + urinary retention, discontinue IVF. Stable BUN/Cr. Stable lytes. Urinary retention: Continue foley, obtain urinalysis and start Flomax.  Acute renal injury: No Chronic renal failure: No Electrolyte abnormality: No Need to keep foley? Yes    GI/Nutrition: improving enteral intake. Continue PPI home regimen. +bowel function.   On GI prophylaxis: Yes, Moderate malnutrition: No     ID: -fever/downtrending leukocytosis (15.8). follow urinalysis.   SIRS: Yes Sepsis: No Severe Sepsis: No  Pneumonia: No  COVID Positive: No    Last COVID test date: 6/14    Heme: stable hct @ 36 , start chemoDVT prophylaxis regimen with lovenox. SCD's for mechanical prophylaxis  On DVT Prophylaxis:  No: Acute blood loss anemia: No Coagulopathy: No     Endocrine: no current issues.    Hyperglycemia -- No  Insulin regimen -- No  Synthroid -- No    Neuro/Sedation: chest wall analgesia: Tylenol 1gm q8 + oxycodone 10mg  po q6hrs + start Motrin for multimodal analgesia needs. Continue lidoderm patch for transdermal analgesic management.  . Neurocognitive medication regimen: continue home Sertraline. continue home neuropathic regimen.   Encephalopathy: No Coma: NoBrain compression: No    Exts/Consults/Other: ICU prophylaxis regimen  - APS evaluation and management  - neurospine evaluation and management.   - trauma sx evaluation and management. Tertiary evaluation pending  - PT/OT evaluation for dispo recommendations   - neurospine evaluation and recommendations.     Social/Family/Dispo: family update  Family present at bedside today?  No    Attending Attestation:    This is a moderate complexity patient.     [X]  This patient is critically ill with at least 1 organ system failure associated with a high probability of life threatening deterioration. The care I have delivered involved high complexity decision making to assess, manipulate, and support vital system function(s), to treat the vital organ system failure and/or prevent further deterioration of the patient's condition. All nursing documentation, laboratory data, test results, and radiographs were reviewed and interpreted by me during multidisciplinary rounds. I have established the management plan for this patient's critical illness and have been immediately available to assist with patient care. My documented total critical care time reflects my own patient care time and does not include teaching  or procedure time.     Admission diagnoses for this admission include:  Acute Blood Loss Anemia, Acute Pulmonary Insufficiency Following Surgery and Electrolyte Imbalance hypoMagnesium    Critical care time since midnight/this 24 hour period: Personal time spent - 20  minutes.  This critical care time is exclusive of any time for separately billable procedures and time spent teaching    Signed by: Melton Krebs, MD  as of 10/30/2019 at 8:14 AM

## 2019-10-30 NOTE — Progress Notes (Signed)
Trauma & Acute Care Surgery Progress Note     LOS: 6 days     Subjective:    No acute events  Foley replaced for retention, started on flomax  Calorie counts      Objective:    Vitals Sign Ranges for Past 24 Hours:  BP: (95-113)/(34-57)   Temp:  [37 C (98.6 F)-37.5 C (99.5 F)]   Temp src: Foley (06/20 0000)  Heart Rate:  [70-87]   Resp:  [13-21]   SpO2:  [94 %-97 %]   Weight:  [122.9 kg (270 lb 15.1 oz)]       Physical Exam:    General Appearance: NAD, sleeping comfortably  HEENT: C-Collar in place  Cardiac: RRR  Respiratory: nonlabored  Abdomen: obese, soft, nt  Extremities: WWP    Labs:    CBC:  Recent Labs   Lab 10/30/19  2159 10/30/19  0025 10/30/19  0025 10/28/19  2314 10/28/19  2314 10/27/19  2336 10/27/19  2336 10/27/19  1202 10/27/19  1202 10/26/19  2319 10/26/19  2319 10/26/19  0818 10/26/19  0341   WBC 11.1*  --  15.8*  --  17.8*  --  14.1*  --   --   --  14.6*  --  15.0*   Hemoglobin 9.9*   < > 10.9*   < > 11.4   < > 11.3   < > 10.6*   < > 11.5   < > 10.2*   Hematocrit 33*  --  36  --  37  --  36  --  35  --  36   < > 33*   Platelets 231  --  257  --  231  --  229  --   --   --  220  --  210    < > = values in this interval not displayed.       Metabolic Panel:  Recent Labs   Lab 10/30/19  2159 10/30/19  0025 10/28/19  2314 10/27/19  2336 10/26/19  2319 10/26/19  0341   Sodium 139 137 137 137 140 142   Potassium 4.4 4.3 4.9 4.3 4.5 4.4   Chloride 99 97 99 100 103 107   CO2 28 32* 32* 31* 30* 26   UN 21* 27* 17 13 12 16    Creatinine 0.60 0.82 0.73 0.62 0.66 0.77   Glucose 179* 128* 127* 113* 142* 144*   Calcium 8.9 9.0 8.8 8.4* 8.7 8.3*   Magnesium 2.2 2.5 2.4 2.1 2.4 1.8   Phosphorus 3.9 3.4 3.5 2.3* 3.3 3.6          Assessment:    74 y.o. female L2T fall from 15 stairs 6/15    Type III odontoid fx  Scalp lac   Periaortic fat stranding   ?lumbar vein extrav  Splenic lac,   ?Pancreatic injury,   L 2, 5-6 rib fx   Small extrapleural hematoma      Plan:    - regular diet  - Analgesia per BTICU  - Pulm  toilet, IS  - SCDs, Lovenox  - appreciate NSG recs. [place] J collar; F/U w/ Dr. Marianne Sofia in 4-6 weeks  - Appreciate BTICU care      Rica Records, MD  Surgery Resident

## 2019-10-30 NOTE — Progress Notes (Signed)
Nursing note (1900-0730):  AVSS - weaned to 2L NC; CPAP from approximately 2200-0430.  Pt A&Ox3; PERRL 4 mm, brisk; MAE; FC; able to communicate needs.  Pain controlled O/N w/ ATC medications.  UOP adequate.  No BM O/N.  WBC 11.1(15.8).  Daughter called & updated by nursing.  Slept fairly well between interventions.     Please see doc flowsheets & MAR for complete assessments, VS, & medication administration.    Latoya Cella, RN      Report Given To    A. Rose, RN    Descriptive Sentence / Reason for Admission   [name] is a 74 y.o. female w/pmhx CAD s/p CABG 2012 in [place] and recent stenting angio 2015 on plavix, COPD, OSA, DM who presented as level II Trauma after fall down 12 stairs at nephew's house during family party.     Injuries:     - right scalp hematoma  - left parietal scalp laceration  - type 3 odontoid fx  - left rib fracture  - splenic laceration    Vascular surgery consulted for assessment and management.         Active Issues / Relevant Events   615N: Admit to [place]. Hypotensive 60/30s with AMS; BP then 80/40s after 2 x 1 L LR fluid boluses given. Given 1 unit PRBC and 1 unit platelets. EKG with trops sent.  R fem triple lumen CVC placed and L fem a-line placed. STAT CTA head/chest/abdomen completed. COVID NEG.  6/15D: Echo done 1015 Erector Spinae Block placed by APS 1550  6/16N: Hypertensive SBPs 170-180s. Given prn 0.5 dilaudid x 1 and 5 mg oxy with no therapeutic effect. Provider notified; pt given 1 time dose 0.25 dilaudid, 10 mg oxy and bupivicaine bolus on PCEA. EKG completed.   6/17: APS Block d/c for MRI. MRI C-Spine completed.   6/18: MIVF. Foley. Desatted x4 high 70s- chest xray and CPAP. 10mg  oxy given- pt lethargic.  6/19D: Flomax started.       To Do List  Q4h VS  Q1h I&O  Q4h Neuro checks  Regular Diet- Calorie counts started 6/18  Daily labs  OOB daily  CPAP HS      Anticipatory Guidance / Discharge Planning  Transfer to floor when medically stable.      Foley Lot#  [id number]    Daughter [name] took Gold ring and opal earrings home 6/15

## 2019-10-30 NOTE — Progress Notes (Addendum)
[facility] Critical Care Progress Note    Interval History:   -  No acute events  Resting comfortable w/ C-collar  Improving po intake          LOS: 6 days     Current Vitals BP 114/53 (BP Location: Right arm)    Pulse 82    Temp 37.4 C (99.3 F) (Foley)    Resp 14    Ht 1.778 m (_0 )    Wt 122.9 kg (270 lb 15.1 oz)    LMP  (LMP Unknown)    SpO2 95%    BMI 38.88 kg/m    VS Range (24 hrs) BP: (95-114)/(34-57)   Temp:  [37 C (98.6 F)-37.5 C (99.5 F)]   Temp src: Foley (06/20 0400)  Heart Rate:  [70-87]   Resp:  [13-21]   SpO2:  [94 %-97 %]   Weight:  [122.9 kg (270 lb 15.1 oz)]      Ventilator: BiPAP/CPAP  CPAP: 14.2 cmH2O Comment: auto (10_17cmH2O range)  O2 Bleed-in: 6 lpm  NIV Interface : Patient Own  Nasal Barrier Applied : Yes Comment: protecta gel being used   NIV Barrier Skin Assesement : clean, dry, intact      Nursing pain score: Last Nursing documented pain:  0-10 Scale: 0 (10/31/19 0400)  NVPS (Critical Care) - Score: 0 (10/31/19 0600)      Intake/Output:  Date 10/30/19 0700 - 10/31/19 0659 10/31/19 0700 - 11/01/19 0659   Shift 0700-1459 1500-2259 2300-0659 24 Hour Total 0700-1459 1500-2259 2300-0659 24 Hour Total   INTAKE   P.O. 1080 [id number]         P.O. 1080 [id number]       I.V.(mL/kg/hr) 401(0.4)   401         Volume (mL) (dextrose 5 % - sodium chloride 0.45 %) 401   401       Shift Total(mL/kg) 1481(12.3) 840(6.8) 240(2) 5732(20.2)       OUTPUT   Urine(mL/kg/hr) 600(0.6) 415(0.4) 345 1360         Urine Output (ml) (Urethral Catheter Foley (Temperature probe)) 600 [phone]       Shift Total(mL/kg) 600(5) 415(3.4) 345(2.8) 1360(11.1)       NET 881 425 -105 1201       Weight (kg) 120 122.9 122.9 122.9 122.9 122.9 122.9 122.9        Active Hospital Medications:   Scheduled Meds  tamsulosin  0.4 mg Oral QPM    budesonide  0.5 mg Nebulization 2 times per day    atorvastatin  40 mg Oral Daily    enoxaparin  30 mg Subcutaneous 2 times per day    lidocaine  1 patch Transdermal Daily     oxyCODONE  5 mg Oral Q6H    bisacodyl  10 mg Rectal Daily    polyethylene glycol  17 g Oral 2 times per day    gabapentin  300 mg Oral TID    acetaminophen  1,000 mg Oral Q8H    bacitracin zinc-polymyxin B sulfate   Topical 2 times per day    pantoprazole  20 mg Oral QAM    sertraline  100 mg Oral Daily    senna  2 tablet Oral 2 times per day    ipratropium-albuterol  3 mL Nebulization Q6H      Continuous Infusions    PRN Meds ibuprofen, ondansetron, albuterol, sodium chloride, dextrose      Physical Exam by Systems:  BP 114/53 (BP Location: Right arm)    Pulse 82    Temp 37.4 C (99.3 F) (Foley)    Resp 14    Ht 1.778 m (_0 )    Wt 122.9 kg (270 lb 15.1 oz)    LMP  (LMP Unknown)    SpO2 95%    BMI 38.88 kg/m     General:  NAD C collar in place  CVS: RRR   PULM: CTAB   ABD: Soft, NT/ND, +BS  EXT: trace edema, distal pulses 2+ bilaterally  NEURO AAOx3, grossly normal  C-Collar Cleared:  no       LABS   BMP:   Recent Labs   Lab 10/30/19  2159 10/30/19  0025 10/28/19  2314 10/26/19  0042 10/25/19  2048   Sodium 139 137 137   < >  --    Potassium 4.4 4.3 4.9   < >  --    Chloride 99 97 99   < >  --    CO2 28 32* 32*   < >  --    Anion Gap 12 8 6*   < >  --    UN 21* 27* 17   < >  --    Creatinine 0.60 0.82 0.73   < > 0.98*   Glucose 179* 128* 127*   < >  --    Calcium 8.9 9.0 8.8   < >  --    Albumin  --   --   --   --  3.8   Phosphorus 3.9 3.4 3.5   < >  --    Magnesium 2.2 2.5 2.4   < >  --     < > = values in this interval not displayed.     CBC:   Recent Labs   Lab 10/30/19  2159 10/30/19  0025 10/28/19  2314   WBC 11.1* 15.8* 17.8*   Hemoglobin 9.9* 10.9* 11.4   Hematocrit 33* 36 37   Platelets 231 257 231     Liver Panel:   Recent Labs   Lab 10/26/19  2319 10/25/19  2048   Albumin  --  3.8   Total Protein  --  6.5   Bilirubin,Total  --  0.2   Bilirubin,Direct  --  <0.2   AST  --  57*   ALT  --  39*   Alk Phos  --  157*   Amylase 27* 50   Lipase 12* 59     ABG:   Recent Labs   Lab 10/25/19  2048    Lactate 1.4     Other labs: No results for input(s): INR, CRP, ESR, TROP, CK, MCKMB, BNP in the last 72 hours.    No components found with this basename: PT, APT       Radiology results   EKG 12 lead    Result Date: 10/28/2019  Sinus rhythm Prolonged PR interval Low voltage, precordial leads Borderline T abnormalities, anterior leads    MR cervical spine without contrast    Result Date: 10/28/2019  Type III odontoid fracture, with 2 mm posterior displacement of the odontoid fracture fragment, extension into the right C2 lateral mass, and disruption of the posterior longitudinal ligament. Injury to the anterior longitudinal ligament may also be present. END OF IMPRESSION I have personally reviewed the images and the Resident's/Fellow's interpretation and agree with or edited the findings. [facility] submits this DICOM format image data and final  report to the [facility], an independent secure electronic health information exchange, on a reciprocally searchable basis (with patient authorization) for a minimum of 12 months after exam date.       ASSESSMENT: [name] is a 74 y.o. female L2T fall from 15 stairs on 6/15    Type III odontoid fx  Scalp lac   Periaortic fat stranding   ?lumbar vein extrav  Splenic lac,   ?Pancreatic injury,    L 2, 5-6 rib fx   Small extrapleural hematoma       Currently Active/Followed Hospital Problems:  Principal Problem:    Fall from 15 stairs 6/15 type III odontoid fx, scalp lac, ?lumbar vein extrav, periaortic fat stranding, splenic lac, ?pancreatic injury, L 2, 5-6 rib fx, small extrapleural hematoma    Patient Active Problem List   Diagnosis Code    Severe Obstructive sleep apnea G47.33    Pulmonary nodule R91.1    CAD (coronary artery disease) I25.10    Prediabetes R73.03    Gastritis K29.70    Obesity E66.9    Dysthymia F34.1    Facial fracture due to fall S02.92XA, [id number]    Orbital floor fracture S02.30XA    Restless leg syndrome G25.81    Health  care maintenance Z00.00    Aortic stenosis I35.0    Anxiety and depression F41.9, F32.9    COPD (chronic obstructive pulmonary disease) J44.9    Osteopenia M85.80    Environmental allergies Z91.09    Fall from 15 stairs 6/15 type III odontoid fx, scalp lac, ?lumbar vein extrav, periaortic fat stranding, splenic lac, ?pancreatic injury, L 2, 5-6 rib fx, small extrapleural hematoma S12.120A       PLAN BY SYSTEMS:   Pulm:  Rib Fx's   -  Home CPAP brought in for use  -Stable 4L supplemental O2 needs --> Weaned to 2L o/n  - Wean NC for Sat >92%  -  Encourage pulm toilet w/ IS/C/DB  - Continue Pulmicort and Duonebs (in place of home Trelegy Ellipta), and PRN albulterol; Continue pulmicort and duonebs (in place of home trelegy ellipta), and PRN albuterol    CVS:  hemodynamically appropriate  - consider resuming home statin  - hold plavix until discharge  -Q4 vitals    BP  Min: 95/46  Max: 114/53  Pulse  Avg: 79.8  Min: 70  Max: 87     Renal/E/F: no acute issues  - normal renal fxn  - MIVF D51/2NS stopped 2/2 + urinary retention  - Started flomax  -stable lytes  -Adequate UOP  - Foley   -UA - marginal- awaiting culture results    Intake/Output Summary (Last 24 hours) at 10/31/2019 0645  Last data filed at 10/31/2019 0559  Gross per 24 hour   Intake 2659.57 ml   Output 1440 ml   Net 1219.57 ml           Recent Labs   Lab 10/30/19  2159 10/30/19  0025   UN 21* 27*   Creatinine 0.60 0.82   Potassium 4.4 4.3       GI/Nutrition:   -  regular diet  -Improving enteral intake  -  Possible DHT if not tolerating diet  -  Bowel reg (miralax, senna, dulcolax scheduled)  -  GI Ppx: on home PPI    ID:  No infectious concerns  - Afebrile, leukocytosis WBC downtrending 11.1 (15.8)  - Cx if becomes afebrile  - COVID neg 6/14  -UA marginal-  awaiting culutre results     Temp (24hrs), Avg:37.3 C (99.1 F), Min:37 C (98.6 F), Max:37.5 C (99.5 F)      Recent Labs   Lab 10/30/19  2159 10/30/19  0025 10/28/19  2314   WBC 11.1* 15.8* 17.8*        Heme:   Recent Labs   Lab 10/30/19  2159 10/30/19  0025 10/28/19  2314   Hematocrit 33* 36 37   - Stable HCTs  -Chemodvt ppx: lovenox   Transfusion? Is Hct <21? no    Endo: No active issues    Neuro/pain:  Acute pain  -Tylenol 1gm q8 + oxycodone 44m po q6h  -Start motrin for multimodal analgesia needs  - Lidoderm patch for transdermal analgesis management  -Tylenol ATC  - Continue Zoloft and home gabapentin    Integument:  Routine wound and ICU care    Musculoskeletal:Type III odontoid fx  - MRI completed  - Neurospine eval and reccs- Continue [place] J collar 4-6 wks, AAT   - PT/OT: recc rehab    Author: AArizona Constable MD  as of: 10/31/2019  at: 6:45 AM

## 2019-10-30 NOTE — Plan of Care (Signed)
Problem: Pain/Comfort  Goal: Patient's pain or discomfort is manageable  Outcome: Progressing towards goal     Problem: Nutrition  Goal: Nutritional status is maintained or improved - Geriatric  Outcome: Progressing towards goal     Problem: Mobility  Goal: Functional status is maintained or improved - Geriatric  Outcome: Progressing towards goal     Problem: Psychosocial  Goal: Demonstrates ability to cope with illness  Outcome: Progressing towards goal     Problem: Safety  Goal: Patient will remain free of falls  Outcome: Maintaining  Goal: Prevent any intentional injury  Outcome: Maintaining     Problem: Cognitive function  Goal: Cognitive function will be maintained or return to baseline  Outcome: Maintaining     Problem: Potential Alteration in Skin Integrity - Pressure Ulcer  Goal: The patient will maintain intact skin free of pressure ulcer  Outcome: Maintaining    Leavy Cella, RN

## 2019-10-30 NOTE — Progress Notes (Signed)
Report Given To  Royetta Car, RN   Afebrile. VSS. A&Ox3, follows commands. OOBTC for 2 hours, tolerated well. Stand and pivot using walker, and hoover back to bed.   Patient consumed 3 scandishakes with vanilla ice cream and whole milk.   Sent UA. Started flomax. 750 on IS   No other acute events during shift.   Please refer to Docflow/MAR/Results for more information.   Lyndle Herrlich, RN      Descriptive Sentence / Reason for Admission   [name] is a 74 y.o. female w/pmhx CAD s/p CABG 2012 in [place] and recent stenting angio 2015 on plavix, COPD, OSA, DM who presented as level II Trauma after fall down 12 stairs at nephew's house during family party.     Injuries:     - right scalp hematoma  - left parietal scalp laceration  - type 3 odontoid fx  - left rib fracture  - splenic laceration    Vascular surgery consulted for assessment and management.         Active Issues / Relevant Events   615N: Admit to [place]. Hypotensive 60/30s with AMS; BP then 80/40s after 2 x 1 L LR fluid boluses given. Given 1 unit PRBC and 1 unit platelets. EKG with trops sent.  R fem triple lumen CVC placed and L fem a-line placed. STAT CTA head/chest/abdomen completed. COVID NEG.  6/15D: Echo done 1015 Erector Spinae Block placed by APS 1550  6/16N: Hypertensive SBPs 170-180s. Given prn 0.5 dilaudid x 1 and 5 mg oxy with no therapeutic effect. Provider notified; pt given 1 time dose 0.25 dilaudid, 10 mg oxy and bupivicaine bolus on PCEA. EKG completed.   6/17: APS Block d/c for MRI. MRI C-Spine completed.   6/18: MIVF. Foley. Desatted x4 high 70s- chest xray and CPAP. 10mg  oxy given- pt lethargic.  6/19D: Flomax started.       To Do List  Q4 VS  Q1 I&O  Q4 Neuro checks  Regular Diet- Calorie counts started 6/18  Daily labs      Anticipatory Guidance / Discharge Planning  Transfer to floor when medically stable.    Foley Lot# [id number]  Daughter [name] took Gold ring and opal earrings home 6/15

## 2019-10-30 NOTE — Plan of Care (Signed)
Problem: Pain/Comfort  Goal: Patient's pain or discomfort is manageable  Outcome: Progressing towards goal     Problem: Nutrition  Goal: Nutritional status is maintained or improved - Geriatric  Outcome: Progressing towards goal     Problem: Mobility  Goal: Functional status is maintained or improved - Geriatric  Outcome: Progressing towards goal     Problem: Safety  Goal: Patient will remain free of falls  Outcome: Maintaining  Goal: Prevent any intentional injury  Outcome: Maintaining     Problem: Psychosocial  Goal: Demonstrates ability to cope with illness  Outcome: Maintaining     Problem: Cognitive function  Goal: Cognitive function will be maintained or return to baseline  Outcome: Maintaining     Problem: Potential Alteration in Skin Integrity - Pressure Ulcer  Goal: The patient will maintain intact skin free of pressure ulcer  Outcome: Maintaining    Leavy Cella, RN

## 2019-10-31 ENCOUNTER — Encounter: Payer: Self-pay | Admitting: Primary Care

## 2019-10-31 DIAGNOSIS — A419 Sepsis, unspecified organism: Secondary | ICD-10-CM

## 2019-10-31 DIAGNOSIS — I6523 Occlusion and stenosis of bilateral carotid arteries: Secondary | ICD-10-CM

## 2019-10-31 DIAGNOSIS — E440 Moderate protein-calorie malnutrition: Secondary | ICD-10-CM

## 2019-10-31 DIAGNOSIS — R9389 Abnormal findings on diagnostic imaging of other specified body structures: Secondary | ICD-10-CM | POA: Insufficient documentation

## 2019-10-31 DIAGNOSIS — J953 Chronic pulmonary insufficiency following surgery: Secondary | ICD-10-CM

## 2019-10-31 HISTORY — DX: Occlusion and stenosis of bilateral carotid arteries: I65.23

## 2019-10-31 MED ORDER — OXYCODONE HCL 5 MG PO TABS *I*
5.0000 mg | ORAL_TABLET | Freq: Four times a day (QID) | ORAL | Status: DC | PRN
Start: 2019-10-31 — End: 2019-10-31

## 2019-10-31 MED ORDER — OXYCODONE HCL 5 MG PO TABS *I*
5.0000 mg | ORAL_TABLET | Freq: Four times a day (QID) | ORAL | Status: DC
Start: 2019-10-31 — End: 2019-10-31

## 2019-10-31 NOTE — Progress Notes (Signed)
Burn / Trauma ICU Attending Plan:    I have seen and evaluated the patient personally on rounds with the resident/Fellow/PA/NP ICU team. I personally examined the patient.  All nursing documentation, laboratory data, test results, and radiographs were reviewed and interpreted by me.I agree with the database, findings, and plan of care recorded in the above note. I have reviewed and edited the above note to reflect my findings as well.  I have established the management plan for this patient's critical illness with the ICU team and have been immediately available to assist with patient care. Additional details are highlighted are within my note below.     24hrs: increasing oral intake.     Catalogue of Injuries    - right scalp hematoma  - left parietal scalp laceration  - type 3 odontoid fx  - left rib fracture  - splenic laceration    Respiratory: stable 2L supplemental oxygen needs: home CPAP settings overnight, aggressive pulmonary toileting with IS. Asthma medication resume: albuterol, pulmicort, duonebs.  Acute Pulmonary Insufficiency -- Yes   Acute Respiratory Failure: No  Ventilator dependent: No    CV: hemodynamically apppropriate. Adequate perfusion. Hold Plavix until discharge. q4 vitals.      Shock: No Dysrrythmia/Arrhythmia: No    GU/Fluids: adequate urine output> 1L + urinary retention. Stable BUN/Cr. Stable lytes.  Acute renal injury: No Chronic renal failure: No Electrolyte abnormality: No Need to keep foley? Yes, urinary retention    GI/Nutrition: improving enteral intake. Continue PPI home regimen. +bowel function.   On GI prophylaxis: Yes, Moderate malnutrition: No     ID: -fever/downtrending leukocytosis (11.1). follow urinalysis: ? 3day course of abx if +ve cx.   SIRS: Yes Sepsis: No Severe Sepsis: No  Pneumonia: No  COVID Positive: No    Last COVID test date: 6/14    Heme: stable hct @ 36, chemoDVT prophylaxis regimen with lovenox. SCD's for mechanical prophylaxis  On DVT Prophylaxis: No: Acute  blood loss anemia: No Coagulopathy: No     Endocrine: no current issues.    Hyperglycemia -- No  Insulin regimen -- No  Synthroid -- No    Neuro/Sedation: chest wall analgesia: Tylenol 1gm q8 + discontinue oxycodone 10mg  po q6hrs + continue Motrin for multimodal analgesia needs. Continue lidoderm patch for transdermal analgesic management.  . Neurocognitive medication regimen: continue home Sertraline.   Encephalopathy: No Coma: NoBrain compression: No    Exts/Consults/Other: ICU prophylaxis regimen  - APS evaluation and management  - neurospine evaluation and management.   - trauma sx evaluation and management. Tertiary evaluation pending  - PT/OT evaluation for dispo recommendations   - neurospine evaluation and recommendations.     Social/Family/Dispo: family update.   Family present at bedside today?  No    Attending Attestation:    This is a moderate complexity patient.     [X]  This patient is critically ill with at least 1 organ system failure associated with a high probability of life threatening deterioration. The care I have delivered involved high complexity decision making to assess, manipulate, and support vital system function(s), to treat the vital organ system failure and/or prevent further deterioration of the patient's condition. All nursing documentation, laboratory data, test results, and radiographs were reviewed and interpreted by me during multidisciplinary rounds. I have established the management plan for this patient's critical illness and have been immediately available to assist with patient care. My documented total critical care time reflects my own patient care time and does not include teaching  or procedure time.     Admission diagnoses for this admission include:  Acute Blood Loss Anemia, Acute Pulmonary Insufficiency Following Surgery and Electrolyte Imbalance hypoMagnesium    Critical care time since midnight/this 24 hour period: Personal time spent - 20 minutes.  This critical care  time is exclusive of any time for separately billable procedures and time spent teaching    Signed by: Melton Krebs, MD  as of 10/31/2019 at 8:07 AM

## 2019-10-31 NOTE — Progress Notes (Addendum)
[facility] Critical Care Progress Note    Interval History:     Episode of Emesis o/n not relieved with zofran, trial of phenergan  Afebrile, hypertensive o/n SBP max 162, 1x hydralazine   - PT refusing cpap o/n 2/2 to pain and incorrect fit due to swelling and c-collar  -Oral intake increasing  -pain controlled      LOS: 7 days     Current Vitals BP 162/77 (BP Location: Right arm)    Pulse 80    Temp 36.8 C (98.2 F) (Temporal)    Resp 16    Ht 1.778 m (_0 )    Wt 122.9 kg (270 lb 15.1 oz)    LMP  (LMP Unknown)    SpO2 94%    BMI 38.88 kg/m    VS Range (24 hrs) BP: (109-162)/(49-77)   Temp:  [36.8 C (98.2 F)-37.6 C (99.7 F)]   Temp src: Temporal (06/21 0430)  Heart Rate:  [71-91]   Resp:  [10-18]   SpO2:  [90 %-96 %]             Nursing pain score: Last Nursing documented pain:  0-10 Scale: 0 (11/01/19 0430)  NVPS (Critical Care) - Score: 0 (10/31/19 1800)      Intake/Output:  Date 10/31/19 0700 - 11/01/19 0659 11/01/19 0700 - 11/02/19 0659   Shift 0109-3235 1500-2259 2300-0659 24 Hour Total 0700-1459 1500-2259 2300-0659 24 Hour Total   INTAKE   P.O. 240 150 75 465         P.O. 240 150 75 465       I.V.(mL/kg/hr)  10(0) 10 20         Saline Flush (ml)  _1 Shift Total(mL/kg) 240(2) 160(1.3) 85(0.7) 485(3.9)       OUTPUT   Urine(mL/kg/hr) 370(0.4) 765(0.8) 440 1575         Urine Output (ml) (Urethral Catheter Foley (Temperature probe)) 370 [id number]       Emesis/NG output             Emesis Occurrence  0 x 1 x 1 x       Stool             Stool Occurrence  0 x 0 x 0 x       Shift Total(mL/kg) 370(3) 765(6.2) 440(3.6) 1575(12.8)       NET -130 -605 -355 -1090       Weight (kg) 122.9 122.9 122.9 122.9 122.9 122.9 122.9 122.9        Active Hospital Medications:   Scheduled Meds  promethazine  12.5 mg Intravenous Once    tamsulosin  0.4 mg Oral QPM    budesonide  0.5 mg Nebulization 2 times per day    atorvastatin  40 mg Oral Daily    enoxaparin  30 mg Subcutaneous 2 times per day    lidocaine  1  patch Transdermal Daily    bisacodyl  10 mg Rectal Daily    polyethylene glycol  17 g Oral 2 times per day    acetaminophen  1,000 mg Oral Q8H    bacitracin zinc-polymyxin B sulfate   Topical 2 times per day    pantoprazole  20 mg Oral QAM    sertraline  100 mg Oral Daily    senna  2 tablet Oral 2 times per day    ipratropium-albuterol  3 mL Nebulization Q6H      Continuous Infusions  PRN Meds ibuprofen, ondansetron, albuterol, sodium chloride, dextrose      Physical Exam by Systems:  BP 162/77 (BP Location: Right arm)    Pulse 80    Temp 36.8 C (98.2 F) (Temporal)    Resp 16    Ht 1.778 m (_0 )    Wt 122.9 kg (270 lb 15.1 oz)    LMP  (LMP Unknown)    SpO2 94%    BMI 38.88 kg/m     General:  NAD, C-collar in place   CVS: RRR   PULM: CTAB   ABD:  Soft, NT/ND, +BS   EXT: trace edema, distal pulses 2+ bilaterally   NEURO AAOx3, grossly normal   C-Collar Cleared:  no       LABS   BMP:   Recent Labs   Lab 11/01/19  0039 10/30/19  2159 10/30/19  0025 10/26/19  0042 10/25/19  2048   Sodium 139 139 137   < >  --    Potassium 5.1 4.4 4.3   < >  --    Chloride 99 99 97   < >  --    CO2 30* 28 32*   < >  --    Anion Gap _1 < >  --    UN 16 21* 27*   < >  --    Creatinine 0.54 0.60 0.82   < > 0.98*   Glucose 130* 179* 128*   < >  --    Calcium 9.3 8.9 9.0   < >  --    Albumin  --   --   --   --  3.8   Phosphorus 4.0 3.9 3.4   < >  --    Magnesium 1.8 2.2 2.5   < >  --     < > = values in this interval not displayed.     CBC:   Recent Labs   Lab 11/01/19  0039 10/30/19  2159 10/30/19  0025   WBC 11.1* 11.1* 15.8*   Hemoglobin 10.1* 9.9* 10.9*   Hematocrit 33* 33* 36   Platelets 287 231 257     Liver Panel:   Recent Labs   Lab 10/26/19  2319 10/25/19  2048   Albumin  --  3.8   Total Protein  --  6.5   Bilirubin,Total  --  0.2   Bilirubin,Direct  --  <0.2   AST  --  57*   ALT  --  39*   Alk Phos  --  157*   Amylase 27* 50   Lipase 12* 59     ABG:   Recent Labs   Lab 10/25/19  2048   Lactate 1.4     Other  labs: No results for input(s): INR, CRP, ESR, TROP, CK, MCKMB, BNP in the last 72 hours.    No components found with this basename: PT, APT       Radiology results   EKG 12 lead    Result Date: 10/28/2019  Sinus rhythm Prolonged PR interval Low voltage, precordial leads Borderline T abnormalities, anterior leads    MR cervical spine without contrast    Result Date: 10/28/2019  Type III odontoid fracture, with 2 mm posterior displacement of the odontoid fracture fragment, extension into the right C2 lateral mass, and disruption of the posterior longitudinal ligament. Injury to the anterior longitudinal ligament may also be present. END OF IMPRESSION I have personally reviewed the  images and the Resident's/Fellow's interpretation and agree with or edited the findings. [facility] submits this DICOM format image data and final report to the [facility], an independent secure electronic health information exchange, on a reciprocally searchable basis (with patient authorization) for a minimum of 12 months after exam date.       ASSESSMENT: [name] is a 74 y.o. female L2T fall from 15 stairs on 6/15    Type III odontoid fx  Scalp lac   Periaortic fat stranding   ?lumbar vein extrav  Splenic lac,   ?Pancreatic injury,    L 2, 5-6 rib fx   Small extrapleural hematoma       Currently Active/Followed Hospital Problems:  Principal Problem:    Fall from 15 stairs 6/15 type III odontoid fx, scalp lac, ?lumbar vein extrav, periaortic fat stranding, splenic lac, ?pancreatic injury, L 2, 5-6 rib fx, small extrapleural hematoma    Patient Active Problem List   Diagnosis Code    Severe Obstructive sleep apnea G47.33    Pulmonary nodule R91.1    CAD (coronary artery disease) I25.10    Prediabetes R73.03    Gastritis K29.70    Obesity E66.9    Dysthymia F34.1    Facial fracture due to fall S02.92XA, [id number]    Orbital floor fracture S02.30XA    Restless leg syndrome G25.81    Health care maintenance Z00.00     Aortic stenosis I35.0    Anxiety and depression F41.9, F32.9    COPD (chronic obstructive pulmonary disease) J44.9    Osteopenia M85.80    Environmental allergies Z91.09    Fall from 15 stairs 6/15 type III odontoid fx, scalp lac, ?lumbar vein extrav, periaortic fat stranding, splenic lac, ?pancreatic injury, L 2, 5-6 rib fx, small extrapleural hematoma S12.120A    Endometrial thickening on CT R93.89    Carotid stenosis, bilateral I65.23       PLAN BY SYSTEMS:   Pulm:  Rib Fx's   -  Home CPAP- declined o/n   -Stable 2L supplemental O2 needs  - Wean NC for sats > 92%  -  Encourage pulm toilet w/ IS  -   Continue pulmicort and duonebs (in place of home trelegy ellipta), and PRN albuterol    CVS:  Hypertensive  -SBP max 162, 1x hydralazine o/n  - Hold plavix until discharge  -Q4 Vitals    BP  Min: 109/55  Max: 162/77  Pulse  Avg: 78.7  Min: 71  Max: 91     Renal/E/F: No acute issues  - normal renal fxn  -flomax   -stable lytes  -Adequate UOP >1L  - Foley  -UA borderline +- Awaiting culture    Intake/Output Summary (Last 24 hours) at 11/01/2019 0541  Last data filed at 11/01/2019 0459  Gross per 24 hour   Intake 485 ml   Output 1700 ml   Net -1215 ml           Recent Labs   Lab 11/01/19  0039 10/30/19  2159   UN 16 21*   Creatinine 0.54 0.60   Potassium 5.1 4.4       GI/Nutrition:   -  Regular diet  -improving enteral intake  -Bowel reg (senna, miralax, dulcolax scheduled)  -GI ppx: on home PPI    ID:  no infectious concerns  -  afebrile, leukocytosis WBC stable 11.1 (11.1)  -follow UA, 3 day course of abx if +ve cx  -  COVID neg  6/14       Temp (24hrs), Avg:37.1 C (98.8 F), Min:36.8 C (98.2 F), Max:37.6 C (99.7 F)      Recent Labs   Lab 11/01/19  0039 10/30/19  2159 10/30/19  0025   WBC 11.1* 11.1* 15.8*       Heme:   Recent Labs   Lab 11/01/19  0039 10/30/19  2159 10/30/19  0025   Hematocrit 33* 33* 36   - Stable HCTs  -Chemodvt ppx: lovenox   Transfusion? Is Hct <21? no    Endo: No active  issues    Neuro/pain:  Acute Pain; Chest wall analgesia   -Tylenol 1gm q8  -discontinue oxycodone 68m Po q6h  -continue Motrin  -Continue lidoderm patch for transdermal analgesic management  Continue home sertraline    Integument:  Routine wound and ICU care    Musculoskeletal:Type III odontoid fx  - MRI completed  - Neurospine eval and reccs-  AAT continue C-collar 4-6wks, AAT  - PT/OT: recc acute rehab    Author: AArizona Constable MD  as of: 11/01/2019  at: 5:41 AM    Burn / Trauma ICU Fellow Plan:    I have seen and evaluated the patient personally on rounds with the resident/attending/PA/NP ICU team. I personally examined the patient.  All nursing documentation, laboratory data, test results, and radiographs were reviewed and interpreted by me. I agree with the database, findings, and plan of care recorded in the above note. I have reviewed and edited the above note to reflect my findings as well.  I have established the management plan for this patient's critical illness with the ICU team and have been immediately available to assist with patient care. Additional details are highlighted are within my note below.     Respiratory: Acute pulmonary insufficiency, rib fractures, OSA, COPD  - on 2L NC, wean as tolerated  - IS, flutter valve, chest PT  - continue home albuterol, pulmicort, duonebs  - CPAP qhs; pt reports home mask did not fit well w/ C collar, will ask respiratory if better fitting mask is available  - obtain CXR  - obtain ABG to evaluate for CO2 retention  Acute Pulmonary Insufficiency -- Yes   Acute Respiratory Failure: No  Ventilator dependent: No    CV: HTN  - VS q4  - holding home plavix   - continue home lipitor 40  Shock: No Dysrrythmia/Arrhythmia: No    GU/Fluids: urinary retention  -  Foley in place  - continue flomax  - monitor K  Acute renal injury: No Chronic renal failure: No Electrolyte abnormality: Yes Need to keep foley? yes retention    GI/Nutrition: Nausea, emesis, constipation  -   Bowel regimen: miralax and senna BID, dulcolax; enema x1 today  - regular diet, scandishake  - continue home PPI  - zofran prn, add compazine prn as second line agent  On GI prophylaxis: Yes, Moderate malnutrition: No     ID: no active issues  - WBC stable  SIRS: No Sepsis: No Severe Sepsis: No  Pneumonia: No  COVID positive: No  Last COVID test date: 06/14    Heme: No acute issues  - H&H stable  - lovenox ppx   On DVT Prophylaxis: Yes Acute blood loss anemia: No Coagulopathy: No     Endocrine: No active issues   Hyperglycemia -- No  Insulin regimen -- No  Synthroid -- No      Neuro/Sedation: Pain, depression  - tylenol ATC  - motrin  prn  - continue lidocaine patches  - continue home sertraline  - off opiates currently  - off gabapentin secondary to lethargy  - pt endorsing feelings of depression, wanting to "give up"; she is amenable to a psych consult  Encephalopathy: Yes Coma: NoBrain compression: No    Exts/Consults/Other: type III odontoid fracture  - PT/OT recommending acute rehab; consult PM&R  - continue C collar x4-6 weeks    Social/Family/Dispo:  - remain in [place] pending improvement in mental status and nausea  Family present at bedside today?  Yes sister    Signed by: Bonney Roussel, MD  as of 11/01/2019 at 1:23 PM    Burn / Trauma ICU Attending Assessment and  Plan:    I have seen and evaluated the patient on rounds with the resident/Fellow/PA/NP ICU team. I personally examined the patient.  All nursing documentation, laboratory data, test results, and radiographs were reviewed and interpreted by me.I agree with the database, findings, and plan of care recorded in the above note. I have reviewed and edited the above note to reflect my findings as well.  I have established the management plan for this patient's critical illness with the ICU team and have been immediately available to assist with patient care.     Attending Attestation:    This is a high complexity patient.     _0  This patient  is critically ill with at least 1 organ system failure associated with a high probability of life threatening deterioration. The care I have delivered involved high complexity decision making to assess, manipulate, and support vital system function(s), to treat the vital organ system failure and/or prevent further deterioration of the patient's condition. All nursing documentation, laboratory data, test results, and radiographs were reviewed and interpreted by me during multidisciplinary rounds. I have established the management plan for this patient's critical illness and have been immediately available to assist with patient care. My documented total critical care time reflects my own patient care time and does not include teaching or procedure time.     Critical care time since midnight/this 24 hour period: Personal time spent -  10 minutes.  This critical care time is exclusive of any time for separately billable procedures and time spent teaching      Signed by: Dorathy Kinsman, MD as of 11/01/2019 at 2:56 PM

## 2019-10-31 NOTE — Progress Notes (Signed)
Report Given To  Battoglia RN  Afebrile. HR 70-80s. BP within parameters. Remains on 2L NC due to lethargy and sleeping and Spo2 down to 85% while asleep. Alert and oriented x3. Lethargic throughout most of shift, sleeping but awakens easily and answers questions appropriately and is appropriate with the conversation. Moving all extremities. Attempted to stand pivot to the chair, patient able to stand x2 but unable to pivot to chair, hovered to chair. Continues to have pain in her neck, tylenol given but oxy held due to lethargy. Continues with foley with amber/blood tinged urine. Ate a few bites of her breakfast, no lunch, a few bites of dinner and drank 2 scandi shakes. Daughters at bedside and updated. Please see doc-flowsheets and MAR for further details. [name]      Descriptive Sentence / Reason for Admission   [name] is a 74 y.o. female w/pmhx CAD s/p CABG 2012 in [place] and recent stenting angio 2015 on plavix, COPD, OSA, DM who presented as level II Trauma after fall down 12 stairs at nephew's house during family party.     Injuries:     - right scalp hematoma  - left parietal scalp laceration  - type 3 odontoid fx  - left rib fracture  - splenic laceration    Vascular surgery consulted for assessment and management.         Active Issues / Relevant Events   615N: Admit to [facility]. Hypotensive 60/30s with AMS; BP then 80/40s after 2 x 1 L LR fluid boluses given. Given 1 unit PRBC and 1 unit platelets. EKG with trops sent.  R fem triple lumen CVC placed and L fem a-line placed. STAT CTA head/chest/abdomen completed. COVID NEG.  6/15D: Echo done 1015 Erector Spinae Block placed by APS 1550  6/16N: Hypertensive SBPs 170-180s. Given prn 0.5 dilaudid x 1 and 5 mg oxy with no therapeutic effect. Provider notified; pt given 1 time dose 0.25 dilaudid, 10 mg oxy and bupivicaine bolus on PCEA. EKG completed.   6/17: APS Block d/c for MRI. MRI C-Spine completed.   6/18: MIVF. Foley. Desatted x4  high 70s- chest xray and CPAP. 10mg  oxy given- pt lethargic.  6/19D: Flomax started.       To Do List  Q4h VS  Q1h I&O  Q4h Neuro checks  Regular Diet- Calorie counts started 6/18  Daily labs  OOB daily  CPAP HS      Anticipatory Guidance / Discharge Planning  Transfer to floor when medically stable.      Foley Lot# [id number]    Daughter [name] took Gold ring and opal earrings home 6/15

## 2019-10-31 NOTE — Progress Notes (Signed)
PT refused cpap for the night, d/t pain and it currently not fitting correctly d/t swelling and c-collar. Nurse aware.

## 2019-11-01 ENCOUNTER — Inpatient Hospital Stay: Payer: Medicare (Managed Care)

## 2019-11-01 DIAGNOSIS — G8911 Acute pain due to trauma: Secondary | ICD-10-CM

## 2019-11-01 DIAGNOSIS — R112 Nausea with vomiting, unspecified: Secondary | ICD-10-CM

## 2019-11-01 DIAGNOSIS — J90 Pleural effusion, not elsewhere classified: Secondary | ICD-10-CM

## 2019-11-01 DIAGNOSIS — S0292XA Unspecified fracture of facial bones, initial encounter for closed fracture: Secondary | ICD-10-CM

## 2019-11-01 DIAGNOSIS — W108XXA Fall (on) (from) other stairs and steps, initial encounter: Secondary | ICD-10-CM

## 2019-11-01 DIAGNOSIS — J9600 Acute respiratory failure, unspecified whether with hypoxia or hypercapnia: Secondary | ICD-10-CM

## 2019-11-01 LAB — ART GASES / WHOLE BLOOD PANEL
Base Excess, Arterial: 8 mmol/L — ABNORMAL HIGH (ref <=2)
CO2,ART (Calc): 34 mmol/L — ABNORMAL HIGH (ref 23–28)
CO: 1.7 %
FO2 Hb, Arterial: 91 % (ref 90–100)
Glucose,WB: 114 mg/dL — ABNORMAL HIGH (ref 60–99)
HCO3, Arterial: 32 mmol/L — ABNORMAL HIGH (ref 21–26)
Hemoglobin: 11.3 g/dL (ref 11.2–15.7)
ICA @7.4,WB: 5.1 mg/dL (ref 4.8–5.2)
ICA Uncorr,WB: 5 mg/dL (ref 4.8–5.2)
Lactate ART,WB: 1 mmol/L — ABNORMAL HIGH (ref 0.3–0.8)
Methemoglobin: 0.3 % (ref 0.0–1.0)
NA, WB: 136 mmol/L (ref 135–145)
Potassium,WB: 4.4 mmol/L (ref 3.3–4.6)
pCO2, Arterial: 45 mm Hg (ref 35–46)
pH: 7.47 — ABNORMAL HIGH (ref 7.35–7.45)
pO2,Arterial: 64 mm Hg — ABNORMAL LOW (ref 80–100)

## 2019-11-01 LAB — MAGNESIUM: Magnesium: 1.8 mg/dL (ref 1.6–2.5)

## 2019-11-01 LAB — BASIC METABOLIC PANEL
Anion Gap: 10 (ref 7–16)
CO2: 30 mmol/L — ABNORMAL HIGH (ref 20–28)
Calcium: 9.3 mg/dL (ref 8.6–10.2)
Chloride: 99 mmol/L (ref 96–108)
Creatinine: 0.54 mg/dL (ref 0.51–0.95)
GFR,Black: 108 *
GFR,Caucasian: 94 *
Glucose: 130 mg/dL — ABNORMAL HIGH (ref 60–99)
Lab: 16 mg/dL (ref 6–20)
Potassium: 5.1 mmol/L (ref 3.3–5.1)
Sodium: 139 mmol/L (ref 133–145)

## 2019-11-01 LAB — CBC AND DIFFERENTIAL
Baso # K/uL: 0.1 10*3/uL (ref 0.0–0.1)
Basophil %: 0.7 %
Eos # K/uL: 0.3 10*3/uL (ref 0.0–0.4)
Eosinophil %: 2.5 %
Hematocrit: 33 % — ABNORMAL LOW (ref 34–45)
Hemoglobin: 10.1 g/dL — ABNORMAL LOW (ref 11.2–15.7)
IMM Granulocytes #: 0.2 10*3/uL — ABNORMAL HIGH (ref 0.0–0.0)
IMM Granulocytes: 1.4 %
Lymph # K/uL: 1.2 10*3/uL (ref 1.2–3.7)
Lymphocyte %: 10.4 %
MCH: 29 pg (ref 26–32)
MCHC: 31 g/dL — ABNORMAL LOW (ref 32–36)
MCV: 93 fL (ref 79–95)
Mono # K/uL: 1 10*3/uL — ABNORMAL HIGH (ref 0.2–0.9)
Monocyte %: 8.5 %
Neut # K/uL: 8.5 10*3/uL — ABNORMAL HIGH (ref 1.6–6.1)
Nucl RBC # K/uL: 0 10*3/uL (ref 0.0–0.0)
Nucl RBC %: 0 /100 WBC (ref 0.0–0.2)
Platelets: 287 10*3/uL (ref 160–370)
RBC: 3.5 MIL/uL — ABNORMAL LOW (ref 3.9–5.2)
RDW: 13.8 % (ref 11.7–14.4)
Seg Neut %: 76.5 %
WBC: 11.1 10*3/uL — ABNORMAL HIGH (ref 4.0–10.0)

## 2019-11-01 LAB — PHOSPHORUS: Phosphorus: 4 mg/dL (ref 2.7–4.5)

## 2019-11-01 MED ORDER — SODIUM CHLORIDE 0.9 % 25 ML IV SOLN *I*
5.0000 mg | Freq: Once | INTRAVENOUS | Status: AC
Start: 2019-11-01 — End: 2019-11-01
  Administered 2019-11-01: 5 mg via INTRAVENOUS
  Filled 2019-11-01: qty 2

## 2019-11-01 MED ORDER — SODIUM CHLORIDE 0.9 % 25 ML IV SOLN *I*
5.0000 mg | INTRAVENOUS | Status: DC | PRN
Start: 2019-11-01 — End: 2019-11-02

## 2019-11-01 MED ORDER — SODIUM CHLORIDE 0.9 % 25 ML IV SOLN *I*
12.5000 mg | Freq: Once | INTRAVENOUS | Status: AC
Start: 2019-11-01 — End: 2019-11-01
  Administered 2019-11-01: 12.5 mg via INTRAVENOUS
  Filled 2019-11-01: qty 1

## 2019-11-01 MED ORDER — SODIUM CHLORIDE 0.9 % 25 ML IV SOLN *I*
5.0000 mg | Freq: Four times a day (QID) | INTRAVENOUS | Status: DC | PRN
Start: 2019-11-01 — End: 2019-11-01

## 2019-11-01 MED ORDER — HYDRALAZINE HCL 20 MG/ML IJ SOLN *I*
10.0000 mg | Freq: Once | INTRAMUSCULAR | Status: AC
Start: 2019-11-01 — End: 2019-11-01
  Administered 2019-11-01: 10 mg via INTRAVENOUS
  Filled 2019-11-01: qty 1

## 2019-11-01 NOTE — Progress Notes (Signed)
Trauma & Acute Care Surgery Progress Note     LOS: 8 days     Subjective:    No acute events, VSS on 2L NC  Foley with adequate UOP  +flatus, denies BM  Tolerating regular diet  Pulling <500 on IS    Objective:    Vitals Sign Ranges for Past 24 Hours:    BP: (113-162)/(54-93)   Temp:  [35.9 C (96.6 F)-37.1 C (98.8 F)]   Temp src: Temporal (06/22 0000)  Heart Rate:  [68-94]   Resp:  [14-17]   SpO2:  [93 %-99 %]     Physical Exam:    General Appearance: NAD  HEENT: C-Collar in place  Cardiac: RRR  Respiratory: nonlabored  Abdomen: obese, mildly distended, soft, nt  Extremities: WWP    Labs:    CBC:  Recent Labs   Lab 11/01/19  1606 11/01/19  0039 11/01/19  0039 10/30/19  2159 10/30/19  2159 10/30/19  0025 10/30/19  0025 10/28/19  2314 10/28/19  2314 10/27/19  2336 10/27/19  2336 10/27/19  1202 10/27/19  1202 10/26/19  2319 10/26/19  2319   WBC  --   --  11.1*  --  11.1*  --  15.8*  --  17.8*  --  14.1*  --   --   --  14.6*   Hemoglobin 11.3   < > 10.1*   < > 9.9*   < > 10.9*   < > 11.4   < > 11.3   < > 10.6*   < > 11.5   Hematocrit  --   --  33*  --  33*  --  36  --  37  --  36  --  35   < > 36   Platelets  --   --  287  --  231  --  257  --  231  --  229  --   --   --  220    < > = values in this interval not displayed.       Metabolic Panel:  Recent Labs   Lab 11/01/19  0039 10/30/19  2159 10/30/19  0025 10/28/19  2314 10/27/19  2336 10/26/19  2319   Sodium 139 139 137 137 137 140   Potassium 5.1 4.4 4.3 4.9 4.3 4.5   Chloride 99 99 97 99 100 103   CO2 30* 28 32* 32* 31* 30*   UN 16 21* 27* 17 13 12    Creatinine 0.54 0.60 0.82 0.73 0.62 0.66   Glucose 130* 179* 128* 127* 113* 142*   Calcium 9.3 8.9 9.0 8.8 8.4* 8.7   Magnesium 1.8 2.2 2.5 2.4 2.1 2.4   Phosphorus 4.0 3.9 3.4 3.5 2.3* 3.3      Assessment:    74 y.o. female L2T fall from 15 stairs 6/15    Type III odontoid fx  Scalp lac   Periaortic fat stranding   ?lumbar vein extrav  Splenic lac,   ?Pancreatic injury,   L 2, 5-6 rib fx   Small extrapleural  hematoma      Plan:    - regular diet  - APS consult today  - CPAP qhs  - Holding home plavix. Rest of home meds as able  - Foley in place. Continue flomax  - Bowel regimen  - pulm toilet, IS (pulling <500 IS)  - SCDs, Lovenox  - PT/OT recs: acute rehab. PMR consult pending  - appreciate NSG recs.  [place] J collar for 4-6 weeks; F/U w/ Dr. Marianne Sofia in 4-6 weeks  - Dispo: SICU pending adequate pain control and improved IS    Sherrilyn Rist, MD  Surgery Resident

## 2019-11-01 NOTE — Progress Notes (Signed)
Writer aware of patient's transfer from 318 to 836.   Hand off given to unit social worker Reinaldo Meeker, LMSW.     Stevphen Meuse, [place]  Pager ID: [phone]  Cell: [phone]

## 2019-11-01 NOTE — Progress Notes (Addendum)
BTICU Progress Note     LOS: 8 days     Interval History:   - PM & R consulted yesterday  - Only tolerated CPAP for an hour last evening   - Still complaining of significant pain     Objective Section:  Temp:  [35.9 C (96.6 F)-36.5 C (97.7 F)] 35.9 C (96.6 F)  Heart Rate:  [68-94] 80  Resp:  [14-17] 15  BP: (113-154)/(54-93) 121/59    Intake and Output:     Intake/Output Summary (Last 24 hours) at 11/02/2019 0644  Last data filed at 11/02/2019 0459  Gross per 24 hour   Intake 290.54 ml   Output 2250 ml   Net -1959.46 ml       Vent settings for last 24 hours:       Nursing pain score: Last Nursing documented pain:  0-10 Scale: 10 (11/02/19 0500)      Physical Exam by Systems:  General: alert, resting comfortably; C-collar present; in NAD   Heart: RRR, no murmurs appreciated, s1s2  Lungs: CTAB  Abdomen: soft, NT, distended, active bowel sounds   Ext: WWP, edema appreciated in BLEs   Neuro: alert, oriented, no focal deficits noted       Lab Results:   Heme:  Recent Labs   Lab 11/02/19  0121 11/01/19  1606 11/01/19  0039 10/30/19  2159 10/30/19  2159   WBC 13.4*  --  11.1*  --  11.1*   Hemoglobin 10.8*   < > 10.1*   < > 9.9*   Hematocrit 34  --  33*  --  33*   Platelets 326  --  287  --  231    < > = values in this interval not displayed.   Chem:  Recent Labs   Lab 11/02/19  0121 11/01/19  0039 11/01/19  0039 10/30/19  2159 10/30/19  2159   Sodium 140  --  139  --  139   Potassium 4.7   < > 5.1   < > 4.4   Chloride 100  --  99  --  99   CO2 31*  --  30*  --  28   UN 17  --  16  --  21*   Creatinine 0.54  --  0.54  --  0.60   Glucose 101*  --  130*  --  179*   Calcium 9.3  --  9.3  --  8.9   Magnesium 2.0  --  1.8  --  2.2   Phosphorus 4.2  --  4.0  --  3.9    < > = values in this interval not displayed.   No results for input(s): PGLU in the last 168 hours.LFT's:  Recent Labs   Lab 10/26/19  2319   Amylase 27*   Lipase 12*   ABG:  Recent Labs   Lab 11/01/19  1606   pH 7.47*   pCO2, Arterial 45   pO2,Arterial 64*    Base Excess, Arterial 8*   CO 1.7            Routine Medications:    tamsulosin  0.4 mg Oral QPM    budesonide  0.5 mg Nebulization 2 times per day    atorvastatin  40 mg Oral Daily    enoxaparin  30 mg Subcutaneous 2 times per day    lidocaine  1 patch Transdermal Daily    bisacodyl  10 mg Rectal Daily  polyethylene glycol  17 g Oral 2 times per day    acetaminophen  1,000 mg Oral Q8H    bacitracin zinc-polymyxin B sulfate   Topical 2 times per day    pantoprazole  20 mg Oral QAM    sertraline  100 mg Oral Daily    senna  2 tablet Oral 2 times per day    ipratropium-albuterol  3 mL Nebulization Q6H         PRN Medications:   prochlorperazine  5 mg Intravenous Q4H PRN    ibuprofen  600 mg Oral Q6H PRN    ondansetron  4 mg Intravenous Q6H PRN    albuterol  2.5 mg Nebulization Q6H PRN    sodium chloride  0-500 mL/hr Intravenous PRN    dextrose  0-500 mL/hr Intravenous PRN       Assessment: Hoda Hon is a 74 y.o. female L2T fall from 15 stairson6/15    Type III odontoid fx  Scalp lac   Periaortic fat stranding   ?lumbar vein extrav  Splenic lac,   ?Pancreatic injury,   L 2, 5-6 rib fx   Small extrapleural hematoma    Currently Active/Followed Hospital Problems:  Active Hospital Problems    Diagnosis     *!*Fall from 15 stairs 6/15 type III odontoid fx, scalp lac, ?lumbar vein extrav, periaortic fat stranding, splenic lac, ?pancreatic injury, L 2, 5-6 rib fx, small extrapleural hematoma          PLAN:    Pulm: L 2, 5-6  rib fractures; Hx OSA (on CPAP), COPD   - Currently on 2L NC, wean as tolerated   - ABG yesterday: 7/47 / 45 / 64 / 32; lactate 1   -  Home CPAP; x 1 hour o/n   -  Encourage pulm toilet w/ IS  -  Continue pulmicort, duonebs (in place of home trelegy ellipta), PRN albuterol     CVS: Hemodynamically appropriate   - Telemetry, q4h vitals  - Home Lipitor 40mg    - Holding home Plavix   BP  Min: 113/54  Max: 154/75  Pulse  Avg: 81.3  Min: 68  Max: 94       Renal/E/F:  Normal renal function  - flomax   - stable lytes  - Foley  Last 24 hour UOP 2060cc, Last 24 hour fluid balance -1795cc  Recent Labs   Lab 11/02/19  0121 11/01/19  0039   UN 17 16   Creatinine 0.54 0.54   Potassium 4.7 5.1       GI/Nutrition: nausea, emesis, constipation    Diet regular  Dietary nutrition supplements adult:    - Bowel regimen: Senna / Miralax / Dulcolax; enema yesterday, repeat today    - Home PPI   - Zofran and Compazine prn for nausea     ID: afebrile, increasing leukocytosis   - UA NGTD  - COVID negative 6/14   Temp (24hrs), Avg:36.2 C (97.2 F), Min:35.9 C (96.6 F), Max:36.5 C (97.7 F)      Recent Labs   Lab 11/02/19  0121 11/01/19  0039   WBC 13.4* 11.1*       Heme: anemia     HCT 34 (33)   Recent Labs   Lab 11/02/19  0121 11/01/19  0039 11/01/19  0039   Hematocrit 34   < > 33*   Platelets 326  --  287    < > = values in this interval not displayed.  Endo: No active issue   - BGs < 160    Neuro/pain: acute pain; chest wall analgesia   - Tylenol ATC   - Continue Motrin 600 mg q6h prn   - Continue lidoderm patch for transdermal analgesic management  - Continue home sertraline 100mg  daily     Integument: Routine wound and ICU care.    Musculoskeletal: type III odontoid fx   - MRI completed  - Neurospine eval and reccs-  AAT continue C-collar 4-6wks, AAT  - PT/OT: rec acute rehab  - PM & R consulted, recs pending   - Consult psych for wanting to "give up"    Prophylaxis: Lovenox     Author: Levon Hedger, PA  as of: 11/02/2019  at: 6:44 AM     Burn / Trauma ICU Fellow Plan:    I have seen and evaluated the patient personally on rounds with the resident/attending/PA/NP ICU team. I personally examined the patient.  All nursing documentation, laboratory data, test results, and radiographs were reviewed and interpreted by me. I agree with the database, findings, and plan of care recorded in the above note. I have reviewed and edited the above note to reflect my findings as well.  I have  established the management plan for this patient's critical illness with the ICU team and have been immediately available to assist with patient care. Additional details are highlighted are within my note below.     Respiratory: Acute pulmonary insufficiency, rib fx, OSA, COPD  - NC 2L, wean as tolerated  - IS, flutter valve, chest PT  - home albuterol, pulmicort, duonebs  - CPAP qhs; having trouble finding a mask that fits w/ C collar, will see if if nasal mask is available   Acute Pulmonary Insufficiency -- Yes   Acute Respiratory Failure: No  Ventilator dependent: No    CV: HTN  - lipitor 40  - holding home plavix   Shock: No Dysrrythmia/Arrhythmia: No    GU/Fluids: urinary retention  - flomax  - d/c foley  - bladder scan & straight cath prn  - BMP MWF   Acute renal injury: No Chronic renal failure: No Electrolyte abnormality: No Need to keep foley? no    GI/Nutrition: no active issues  - regular diet  - d/c scandishake, try magic cup & ensure  - bowel regimen  - continue home PPI  - had one BM after enema yesterday; repeat enema today  - zofran prn; d/c compazine (not using)  On GI prophylaxis: Yes, Moderate malnutrition: No     ID: afebrile leukocytosis  - continue to monitor, no abx for now  SIRS: No Sepsis: No Severe Sepsis: No  Pneumonia: No  COVID positive: No  Last COVID test date: 06/14    Heme: no active issues  - CBC daily  - H&H stable  - lovenox ppx   On DVT Prophylaxis: Yes Acute blood loss anemia: No Coagulopathy: No     Endocrine: No active issues   Hyperglycemia -- No  Insulin regimen -- No  Synthroid -- No      Neuro/Sedation: Pain, depression  - tylenol ATC  - motrin ATC (alternate w/ tylenol)  - lidocaine patches  - APS consult for pain management  - continue home sertraline  - holding gabapentin due to lethargy  - f/u psych consult  Encephalopathy: Yes Coma: NoBrain compression: No    Exts/Consults/Other: type III odontoid fx  - PT/OT/PM&R  - psych  - C-collar x4-6 weeks  Social/Family/Dispo: remain in [place] pending clinical improvement  Family present at bedside today?  No    Signed by: Bonney Roussel, MD  as of 11/02/2019 at 9:51 AM    Burn / Trauma ICU Attending Assessment and  Plan:    I have seen and evaluated the patient personally on rounds with the resident/Fellow/PA/NP ICU team. I personally examined the patient.  All nursing documentation, laboratory data, test results, and radiographs were reviewed and interpreted by me.I agree with the database, findings, and plan of care recorded in the above note. I have reviewed and edited the above note to reflect my findings as well.  I have established the management plan for this patient's critical illness with the ICU team and have been immediately available to assist with patient care.     Attending Attestation:    This is a high complexity patient.     [X]  This patient is critically ill with at least 1 organ system failure associated with a high probability of life threatening deterioration. The care I have delivered involved high complexity decision making to assess, manipulate, and support vital system function(s), to treat the vital organ system failure and/or prevent further deterioration of the patient's condition. All nursing documentation, laboratory data, test results, and radiographs were reviewed and interpreted by me during multidisciplinary rounds. I have established the management plan for this patient's critical illness and have been immediately available to assist with patient care. My documented total critical care time reflects my own patient care time and does not include teaching or procedure time.     Critical care time since midnight/this 24 hour period: Personal time spent -  15 minutes.  This critical care time is exclusive of any time for separately billable procedures and time spent teaching      Signed by: Dorathy Kinsman, MD as of 11/02/2019 at 1:16 PM

## 2019-11-01 NOTE — Progress Notes (Signed)
Report Given To  [place], RN       Descriptive Sentence / Reason for Admission   [name] is a 74 y.o. female w/pmhx CAD s/p CABG 2012 in [place] and recent stenting angio 2015 on plavix, COPD, OSA, DM who presented as level II Trauma after fall down 12 stairs at nephew's house during family party.     Injuries:     - right scalp hematoma  - left parietal scalp laceration  - type 3 odontoid fx  - left rib fracture  - splenic laceration    Vascular surgery consulted for assessment and management.         Active Issues / Relevant Events   615N: Admit to [place]. Hypotensive 60/30s with AMS; BP then 80/40s after 2 x 1 L LR fluid boluses given. Given 1 unit PRBC and 1 unit platelets. EKG with trops sent.  R fem triple lumen CVC placed and L fem a-line placed. STAT CTA head/chest/abdomen completed. COVID NEG.  6/15D: Echo done 1015 Erector Spinae Block placed by APS 1550  6/16N: Hypertensive SBPs 170-180s. Given prn 0.5 dilaudid x 1 and 5 mg oxy with no therapeutic effect. Provider notified; pt given 1 time dose 0.25 dilaudid, 10 mg oxy and bupivicaine bolus on PCEA. EKG completed.   6/17: APS Block d/c for MRI. MRI C-Spine completed.   6/18: MIVF. Foley. Desatted x4 high 70s- chest xray and CPAP. 10mg  oxy given- pt lethargic.  6/19D: Flomax started.   6/20-21: VS WDL on 2L NC A&O x3. Drowsy throughout most of shift, sleeping but awakens easily and answers questions appropriately, Complaint of neck pain PRN Ibuprofen x1, complaint of nausea, PRN zofran x2, Refused CPAP overnight, Adequate UO  6/21 7824-2353: Admit to 12-3598. Emesis x1. Phenergan x1. SBP 160's-Hydralazine x1 with minimal effect.       To Do List  Q4h VS  Q1h I&O  Q4h Neuro checks  Regular Diet- Calorie counts started 6/18  Daily labs  OOB daily  CPAP HS      Anticipatory Guidance / Discharge Planning  PIV x2, foley         Daughter Joy took Gold ring and opal earrings home 6/15

## 2019-11-01 NOTE — Progress Notes (Signed)
Trauma & Acute Care Surgery Progress Note     LOS: 7 days     Subjective:    No acute events   Pulling < 500 on IS  Pain is manageable     Objective:    Vitals Sign Ranges for Past 24 Hours:    BP: (109-162)/(55-93)   Temp:  [36.5 C (97.7 F)-37.6 C (99.7 F)]   Temp src: Temporal (06/21 0800)  Heart Rate:  [69-91]   Resp:  [10-18]   SpO2:  [90 %-96 %]     Physical Exam:    General Appearance: NAD  HEENT: C-Collar in place  Cardiac: RRR  Respiratory: nonlabored  Abdomen: obese, soft, nt  Extremities: WWP    Labs:    CBC:  Recent Labs   Lab 11/01/19  0039 10/30/19  2159 10/30/19  2159 10/30/19  0025 10/30/19  0025 10/28/19  2314 10/28/19  2314 10/27/19  2336 10/27/19  2336 10/27/19  1202 10/27/19  1202 10/26/19  2319 10/26/19  2319   WBC 11.1*  --  11.1*  --  15.8*  --  17.8*  --  14.1*  --   --   --  14.6*   Hemoglobin 10.1*   < > 9.9*   < > 10.9*   < > 11.4   < > 11.3   < > 10.6*   < > 11.5   Hematocrit 33*  --  33*  --  36  --  37  --  36  --  35   < > 36   Platelets 287  --  231  --  257  --  231  --  229  --   --   --  220    < > = values in this interval not displayed.       Metabolic Panel:  Recent Labs   Lab 11/01/19  0039 10/30/19  2159 10/30/19  0025 10/28/19  2314 10/27/19  2336 10/26/19  2319   Sodium 139 139 137 137 137 140   Potassium 5.1 4.4 4.3 4.9 4.3 4.5   Chloride 99 99 97 99 100 103   CO2 30* 28 32* 32* 31* 30*   UN 16 21* 27* 17 13 12    Creatinine 0.54 0.60 0.82 0.73 0.62 0.66   Glucose 130* 179* 128* 127* 113* 142*   Calcium 9.3 8.9 9.0 8.8 8.4* 8.7   Magnesium 1.8 2.2 2.5 2.4 2.1 2.4   Phosphorus 4.0 3.9 3.4 3.5 2.3* 3.3      Assessment:    74 y.o. female L2T fall from 15 stairs 6/15    Type III odontoid fx  Scalp lac   Periaortic fat stranding   ?lumbar vein extrav  Splenic lac,   ?Pancreatic injury,   L 2, 5-6 rib fx   Small extrapleural hematoma      Plan:    - regular diet  - consider APS consult  - pulm toilet, IS  - SCDs, Lovenox  - appreciate NSG recs. [place] J collar; F/U w/ Dr. Marianne Sofia  in 4-6 weeks    [place], DDS  Surgery Resident

## 2019-11-01 NOTE — Progress Notes (Signed)
Report Given To  [id number] RN      Descriptive Sentence / Reason for Admission   Shadow Stiggers is a 74 y.o. female w/pmhx CAD s/p CABG 2012 in [place] and recent stenting angio 2015 on plavix, COPD, OSA, DM who presented as level II Trauma after fall down 12 stairs at nephew's house during family party.     Injuries:     - right scalp hematoma  - left parietal scalp laceration  - type 3 odontoid fx  - left rib fracture  - splenic laceration    Vascular surgery consulted for assessment and management.         Active Issues / Relevant Events   615N: Admit to [place]. Hypotensive 60/30s with AMS; BP then 80/40s after 2 x 1 L LR fluid boluses given. Given 1 unit PRBC and 1 unit platelets. EKG with trops sent.  R fem triple lumen CVC placed and L fem a-line placed. STAT CTA head/chest/abdomen completed. COVID NEG.  6/15D: Echo done 1015 Erector Spinae Block placed by APS 1550  6/16N: Hypertensive SBPs 170-180s. Given prn 0.5 dilaudid x 1 and 5 mg oxy with no therapeutic effect. Provider notified; pt given 1 time dose 0.25 dilaudid, 10 mg oxy and bupivicaine bolus on PCEA. EKG completed.   6/17: APS Block d/c for MRI. MRI C-Spine completed.   6/18: MIVF. Foley. Desatted x4 high 70s- chest xray and CPAP. 10mg  oxy given- pt lethargic.  6/19D: Flomax started.   6/20-21: VS WDL on 2L NC A&O x3. Drowsy throughout most of shift, sleeping but awakens easily and answers questions appropriately, Complaint of neck pain PRN Ibuprofen x1, complaint of nausea, PRN zofran x2, Refused CPAP overnight, Adequate UO      To Do List  Q4h VS  Q1h I&O  Q4h Neuro checks  Regular Diet- Calorie counts started 6/18  Daily labs  OOB daily  CPAP HS      Anticipatory Guidance / Discharge Planning  Transfer to floor when medically stable.      Foley Lot# [id number]    Daughter [name] took Gold ring and opal earrings home 6/15

## 2019-11-01 NOTE — Plan of Care (Signed)
Problem: Safety  Goal: Patient will remain free of falls  Outcome: Maintaining  Goal: Prevent any intentional injury  Outcome: Maintaining     Problem: Pain/Comfort  Goal: Patient's pain or discomfort is manageable  Outcome: Maintaining     Problem: Nutrition  Goal: Nutritional status is maintained or improved - Geriatric  Outcome: Maintaining     Problem: Mobility  Goal: Functional status is maintained or improved - Geriatric  Outcome: Maintaining     Problem: Psychosocial  Goal: Demonstrates ability to cope with illness  Outcome: Maintaining     Problem: Cognitive function  Goal: Cognitive function will be maintained or return to baseline  Outcome: Maintaining     Problem: Potential Alteration in Skin Integrity - Pressure Ulcer  Goal: The patient will maintain intact skin free of pressure ulcer  Outcome: Maintaining

## 2019-11-01 NOTE — Provider Consult (Signed)
Physical Medicine and Rehabilitation  Provider Consult note    Patient Identification   [name] 74 y.o. female   Date of Birth: 1945-09-22   Admit Date:  10/25/2019  Attending Provider:  Alberteen Sam, MD   Primary Care Physician: [name], MD     Reason for Consult: Impaired mobility and ADLs    Date of Onset ([facility] Admission): 10/25/2019  Event Onset (if different from hospitalization date): 10/25/19    History of present illness:   [name] is a 74 y.o. female  with history of CAD s/p CABG and stenting, asthma, OSA, presented on 6/14 as a level 2 trauma after a mechanical fall down (lost footing, ETOH <10) about 12-15 steps. GCS 15. Injuries included: right scalp hematoma, left parietal scalp laceration, type III odontoid fracture, multiple left rib fractures, small splenic laceration, and periaortic fat stranding. She has been reporting left rib pain. Vascular surgery was involved. CT angio of aorta showed left retroperitoneal hematoma abutting the psoas muscle but no aortic extravasation. Possible ureter mural injury without contrast extravasation, mild left hydronephrosis, hematoma adjacent to pancreatic tail, small hematoma left lower abdominal wall. Small hematoma left anterior pelvis. Urology involved for ureter findings and no acute urological intervention felt to be necessary-recommended renal US follow up in 1 month. Neurosurgery was involved for the odontoid fracture (with extension into right lateral mass). They recommended C-collar at all times, no spine precautions needed. 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 C-collar recommended for 4-6 weeks on follow up neurosurgery resident note. No comment left re: whether injury to ALL was real-2 column injury would be unstable spine.       Review of Systems   Constitutional: no fevers, + fatigue  HENT: constant headache, Tylenol doesn't help that  much. Hard time opening her right eye. Chronic tinnitus. Mouth feels dry.   Pulmonary: Some SOB. + cough-weak, non-productive  CV: hx of heart murmur, no angina, no palpitations  Gastrointestinal: + N, V, abdominal discomfort, had constipation, then got bowel meds now stool just running out.   Genitourinary: using bedpan or pad in bed.   Musculoskeletal: Pain in neck and in left leg around her knee, xray showed nothing broken but has a lot of bruising.   Endocrine: No DM  Skin: no known bedsores  Mood/behavior/psych: a bit anxious. Sleep interrupted by pain.   Neuro: some numbness in fingers and toes but this was present even prior to admission. Feels generalized weakness.       Medications (scheduled)   tamsulosin  0.4 mg Oral QPM    budesonide  0.5 mg Nebulization 2 times per day    atorvastatin  40 mg Oral Daily    enoxaparin  30 mg Subcutaneous 2 times per day    lidocaine  1 patch Transdermal Daily    bisacodyl  10 mg Rectal Daily    polyethylene glycol  17 g Oral 2 times per day    acetaminophen  1,000 mg Oral Q8H    bacitracin zinc-polymyxin B sulfate   Topical 2 times per day    pantoprazole  20 mg Oral QAM    sertraline  100 mg Oral Daily    senna  2 tablet Oral 2 times per day    ipratropium-albuterol  3 mL Nebulization Q6H          Medications (prn)  prochlorperazine, ibuprofen, ondansetron, albuterol, sodium chloride, dextrose  Allergies   Allergen Reactions    Morphine Hives     Has tolerated Percocet         Past Medical History:   Diagnosis Date    Allergic rhinitis 09/30/2014    Arthritis     Asthma     CAD (coronary artery disease)     DVT (deep venous thrombosis)     post-op after CABG, on anticoagulation for ~2 years    PE (pulmonary embolism)     post-op after CABG, on anticoagulation for ~2 years    Prediabetes     Tubular adenoma 03/2015    colonoscopy          Past Surgical History:   Procedure Laterality Date    ACHILLES TENDON SURGERY      APPENDECTOMY       CHOLECYSTECTOMY      CORONARY ANGIOPLASTY WITH STENT PLACEMENT      CORONARY ARTERY BYPASS GRAFT  2013    triple     CYST REMOVAL      both thumbs    PR REPAIR EYE BLOWOUT,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    Procedure: ORBIT ORIF;  Surgeon: Jannett Celestine, DDS;  Location: [facility] MAIN OR;  Service: OMFS    TONSILLECTOMY AND ADENOIDECTOMY           Family History   Problem Relation Age of Onset    Other Mother         "rare lung disease"    Aneurysm Mother         abdominal aortic aneurysm    Diabetes Type II Mother     Breast cancer Mother     Blood clots Mother     Stroke Father     Stroke Brother         x2    Thyroid cancer Daughter        Social History    Social History     Socioeconomic History    Marital status: Widowed   Occupational History    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   Home Environment / Accessibility:   Apartment with no steps to enter (has elevator).       Current Functional Status:  PT 6/18  Mobility Dep Max Mod Min CG S Mod I I   Bed (not tested)           Transfer  2 PA, HHA         Ambulation (not tested)           Balance    x         OT  No evaluation at this time  ADL Dep Max Mod Min CG S Mod I  I   Feeding           Grooming           UE dress           LE dress  Bathing           Toileting                 Speech/swallow status: no SLP evaluation. Currently on regular diet with thin liquids.     Weight bearing restrictions: c-collar all times    Continence    Bowel: small BM 6/17 watery    Bladder: no  incontinence reported on nursing flow sheets        Objective:   BP 150/61 (BP Location: Right arm)    Pulse 68    Temp 36.5 C (97.7 F) (Temporal)    Resp 14    Ht 1.778 m (5\' 10" )    Wt 122.9 kg (270 lb 15.1 oz)    LMP  (LMP Unknown)    SpO2 99%    BMI 38.88 kg/m     There were no vitals filed for this visit.    Gen: lying in bed, NC oxygen in place, C-collar in place, quite fatigued  HEENT: large contusion/hematoma right brow/forehead, ecchymoses to both eyes at lids.   CVS: Regular rate and rhythm, 3/6 holosystolic murmur, extremities warm and well perfused, no LE edema.   Pulm: shallow effort but clear to auscultation  GI: Non-tender, non-distended, BS+, soft   MSK: tenderness left upper lateral ribs. Pain reported in limbs with AROM left arm, pain reported with AROM left leg.   NEURO:  Mental status: sleepy but cooperative  Orientation: intact to person, place, situation  Speech: Mild dysphonia, hypophonia but appropriate.   CN: 2-12 grossly intact   Motor strength:    Right Left    Shoulder abductors 4+ 4   C5 Elbow flexors 5 5   C6 Wrist extensor 5 5   C7 Elbow extensor 4+ 4   C8 Finger flexors 5 5   T1 Finger abductor 5 5   L2 Hip flexors 3 1   L3 Knee extensors 5 4   L4 Ankle dorsiflexors 5 4   L5 Extensor hallucis longus 5 4   S1 Ankle plantar flexors 5 4        Tone:normal   Sensation: intact to LT  Coordination: no tremor or ataxia  DTR not brisk    Labs      Lab results: 11/01/19  1606 11/01/19  0039 10/30/19  2159 10/30/19  0025 10/30/19  0025   WBC  --  11.1* 11.1*  --  15.8*   Hemoglobin 11.3 10.1* 9.9*   < > 10.9*   Hematocrit  --  33* 33*  --  36   RBC  --  3.5* 3.6*  --  3.9   Platelets  --  287 231  --  257    < > = values in this interval not displayed.           Lab results: 11/01/19  0039 10/30/19  2159 10/30/19  0025   Sodium 139 139 137   Potassium 5.1 4.4 4.3   Chloride 99 99 97   CO2 30* 28 32*   UN 16 21* 27*   Creatinine 0.54 0.60 0.82   GFR,Caucasian 94 91 71   GFR,Black 108 105 82    Glucose 130* 179* 128*   Calcium 9.3 8.9 9.0           Lab results: 08/11/19  1557   Cholesterol 132   HDL 36*   LDL Calculated 68   Triglycerides 140   Chol/HDL Ratio 3.7  No components found with this basename: NHLDC    Hemoglobin A1C   Date Value Ref Range Status   08/11/2019 5.9 (H) % Final     Comment:     Ref Range <=5.6  HbA1c values of 5.7-6.4% indicate an increased risk for developing  diabetes mellitus.  HbA1c values greater than or equal to 6.5% are diagnostic of  diabetes mellitus.  For diagnosis of diabetes in individuals without unequivocal  hyperglycemia, results should be confirmed by repeat testing.         Albumin   Date Value Ref Range Status   10/25/2019 3.8 3.5 - 5.2 g/dL Final                  Lab results: 10/25/19  2048   INR 1.0           Lab results: 10/26/19  0818 10/26/19  0514   TROP T 0 HR High Sensitivity  --  25*   TROP T 3 HR High Sensitivity 23* CANCELED   TROP T 0-3 HR DELTA High Sensitivity -2* CANCELED   HS TROP % Change -8*  --              Imaging:      * Abdomen standard AP single view/KUB    Result Date: 11/01/2019  Mildly dilated air-filled colonic loops extending to the rectum which may represent ileus. END OF IMPRESSION       Assessment/Recommendations:     Zacari Radick is a 74 y.o. female with CAD, asthma, OSA, s/p mechanical fall down about 12 steps. Injuries include right brow/forehead contusion, left parietal scalp lac, type III odontoid fracture (per my discussion with Dr. Marianne Sofia despite concern for ALL injury, spine stable and okay to continue just with C-collar all times for now)., multiple left rib fractures, small splenic laceration, left retroperitoneal hematoma, hematoma adjacent to pancreatic tail, small hematoma left anterior pelvis, ureteral injury without rupture.     Given the increased weakness in the left proximal leg wonder if she may have had some femoral neuropathy due to the retroperitoneal hematoma, however this could just be reflective  of pain in limb as this was the more affected side.     In order to be ready for acute rehab, patient will need:  - to have better endurance to be able to tolerate 3 hours of therapy daily  - be able to tolerate OOB to chair 2 hours BID  - have adequate pain control without sedation to tolerate intensive therapies.  - ongoing PT, OT needs by time of medical and endurance readiness.        Would suggest:  Work to OOB to chair  Continue PT in room-try to see at least 2-3 times a week.   Have PT leave exercises for patient to do in bed with nursing.   Nursing should do ROM to limbs daily as patient can tolerate.   Work to improve sleep at bedtime:   - keep room quiet and dark at night and minimize staff interruptions  - keep lights on and interact with patient during the day and minimize daytime naps to no longer than 20-25 minutes  - consider scheduled pain meds and melatonin at bedtime  Consider pain meds prior to therapies  Have SW discuss with children how much assist they can provide after discharge.     We will continue to follow along for progress and leave recommendations as needed.     Thank  you very much for this consult and allowing [place] to participate in this patient's care. If you have any questions or concerns, please feel free to call the consult NP, [name] or contact the consult phone during weekdays.     Latoya Sizer, MD 11/01/2019 4:20 PM

## 2019-11-02 DIAGNOSIS — S2242XD Multiple fractures of ribs, left side, subsequent encounter for fracture with routine healing: Secondary | ICD-10-CM

## 2019-11-02 LAB — BASIC METABOLIC PANEL
Anion Gap: 9 (ref 7–16)
CO2: 31 mmol/L — ABNORMAL HIGH (ref 20–28)
Calcium: 9.3 mg/dL (ref 8.6–10.2)
Chloride: 100 mmol/L (ref 96–108)
Creatinine: 0.54 mg/dL (ref 0.51–0.95)
GFR,Black: 108 *
GFR,Caucasian: 94 *
Glucose: 101 mg/dL — ABNORMAL HIGH (ref 60–99)
Lab: 17 mg/dL (ref 6–20)
Potassium: 4.7 mmol/L (ref 3.3–5.1)
Sodium: 140 mmol/L (ref 133–145)

## 2019-11-02 LAB — CBC AND DIFFERENTIAL
Baso # K/uL: 0.1 10*3/uL (ref 0.0–0.1)
Basophil %: 0.9 %
Eos # K/uL: 0.3 10*3/uL (ref 0.0–0.4)
Eosinophil %: 2.1 %
Hematocrit: 34 % (ref 34–45)
Hemoglobin: 10.8 g/dL — ABNORMAL LOW (ref 11.2–15.7)
IMM Granulocytes #: 0.3 10*3/uL — ABNORMAL HIGH (ref 0.0–0.0)
IMM Granulocytes: 2 %
Lymph # K/uL: 1.5 10*3/uL (ref 1.2–3.7)
Lymphocyte %: 11.5 %
MCH: 29 pg (ref 26–32)
MCHC: 32 g/dL (ref 32–36)
MCV: 90 fL (ref 79–95)
Mono # K/uL: 1.2 10*3/uL — ABNORMAL HIGH (ref 0.2–0.9)
Monocyte %: 9.2 %
Neut # K/uL: 9.9 10*3/uL — ABNORMAL HIGH (ref 1.6–6.1)
Nucl RBC # K/uL: 0 10*3/uL (ref 0.0–0.0)
Nucl RBC %: 0 /100 WBC (ref 0.0–0.2)
Platelets: 326 10*3/uL (ref 160–370)
RBC: 3.8 MIL/uL — ABNORMAL LOW (ref 3.9–5.2)
RDW: 13.8 % (ref 11.7–14.4)
Seg Neut %: 74.3 %
WBC: 13.4 10*3/uL — ABNORMAL HIGH (ref 4.0–10.0)

## 2019-11-02 LAB — MAGNESIUM: Magnesium: 2 mg/dL (ref 1.6–2.5)

## 2019-11-02 LAB — PHOSPHORUS: Phosphorus: 4.2 mg/dL (ref 2.7–4.5)

## 2019-11-02 MED ORDER — HYDROMORPHONE HCL PF 1 MG/ML IJ SOLN *WRAPPED*
0.2500 mg | Freq: Once | INTRAMUSCULAR | Status: AC
Start: 2019-11-02 — End: 2019-11-02
  Administered 2019-11-02: 0.25 mg via INTRAVENOUS
  Filled 2019-11-02: qty 0.5

## 2019-11-02 MED ORDER — GABAPENTIN 100 MG PO CAPSULE *I*
100.0000 mg | ORAL_CAPSULE | Freq: Three times a day (TID) | ORAL | Status: DC
Start: 2019-11-02 — End: 2019-11-09
  Administered 2019-11-02 – 2019-11-09 (×17): 100 mg via ORAL
  Filled 2019-11-02 (×25): qty 1

## 2019-11-02 MED ORDER — HALOPERIDOL LACTATE 5 MG/ML IJ SOLN *I*
1.0000 mg | Freq: Four times a day (QID) | INTRAMUSCULAR | Status: DC | PRN
Start: 2019-11-02 — End: 2019-11-09
  Administered 2019-11-08: 1 mg via INTRAVENOUS
  Filled 2019-11-02: qty 1

## 2019-11-02 MED ORDER — IBUPROFEN 600 MG PO TABS *I*
600.0000 mg | ORAL_TABLET | Freq: Four times a day (QID) | ORAL | Status: DC
Start: 2019-11-02 — End: 2019-11-09
  Administered 2019-11-02 – 2019-11-09 (×26): 600 mg via ORAL
  Filled 2019-11-02 (×29): qty 1

## 2019-11-02 MED ORDER — OXYCODONE HCL 5 MG PO TABS *I*
2.5000 mg | ORAL_TABLET | Freq: Four times a day (QID) | ORAL | Status: DC | PRN
Start: 2019-11-02 — End: 2019-11-09
  Administered 2019-11-02 – 2019-11-09 (×11): 2.5 mg via ORAL
  Filled 2019-11-02 (×12): qty 1

## 2019-11-02 MED ORDER — OXYCODONE HCL 5 MG PO TABS *I*
5.0000 mg | ORAL_TABLET | Freq: Four times a day (QID) | ORAL | Status: DC | PRN
Start: 2019-11-02 — End: 2019-11-09
  Administered 2019-11-07 – 2019-11-08 (×2): 5 mg via ORAL
  Filled 2019-11-02 (×3): qty 1

## 2019-11-02 NOTE — Telephone Encounter (Signed)
Safe Transitions Review of Chart done today.  Risk Score %: 51   Patient is admitted to:   [facility]   [phone]      Being followed by:   Critical Care  Diagnosis:    Fall down 12 steps  Injuries:   - right scalp hematoma  - left parietal scalp laceration  - type 3 odontoid fx  - left rib fracture  - splenic laceration  Est. Discharge Date:   To Be Determined/continues in ICU   I will continue to review patient's progress until discharge.  If at discharge PCP feels it is warranted, we can discuss Care Management needs at that time.   Time spent on chart review: 15 minutes    Encarnacion Chu, RN, [place]   [facility] [place]

## 2019-11-02 NOTE — Progress Notes (Signed)
SPIRITUAL ASSESSMENT NOTE    Chaplain conducted an introductory visit with the pt.    Pt's family member was present as well, sister Blanch Media. We talked through the difficulty of the pt's situation. I offered normalization of feelings and I prayed for the pt and their family at their request, for which they were thankful.                        Family Coping: Sadness, Hopeful/ optimistic  Chaplain Interventions: Prayer, Normalized feelings, Reflective listening    Plan of Care:  Follow up by request     [facility] Florentino Laabs      10:07 AM on 11/02/2019

## 2019-11-02 NOTE — Progress Notes (Signed)
Acute Pain Service Adult Daily Progress Note for Inpatients  Hospital-day:  LOS: 8 days     Closed odontoid fracture with type III morphology, initial encounter    [name] was previously seen by APS after her arrival in the [place] s/p fall w/ left posterior 2, 5, 6 rib fractures for which we placed a left sided T5 erector spinae catheter 6/15 that was removed on 6/17 to facilitate an MRI. The catheter seemed to offer little benefit for pain control, so it was not replaced. At the time, she was not able to pull more than 568ml on IS, but denied that this was limited by pain.   Today we have been asked to see [name] again as she is now in more pain after having her medications discontinued due to somnolence. She is currently awake and complains of pain on her left subacromial chest, in her neck, and abdomen. She denies pain elsewhere throughout her chest wall.     Pain Scores:   Rest:4/10   Cough/Move:6/10   24hr High:10/10    Last Nursing documented pain:  0-10 Scale: 7 (11/02/19 1200)      Significant 24hr Events:  Fever: no   Anticoagulation: holding plavix through discharge, on ppx lovenox    Vitals:  Patient Vitals for the past 24 hrs:   BP Temp Temp src Pulse Resp SpO2   11/02/19 1200 158/87 36 C (96.8 F) TEMPORAL 85 16 94 %   11/02/19 0800 140/71   83 13 94 %   11/02/19 0400 121/59 35.9 C (96.6 F) TEMPORAL 80 15 96 %   11/02/19 0000 142/60 36.5 C (97.7 F) TEMPORAL 87 15 94 %   11/01/19 2358      96 %   11/01/19 2234      95 %   11/01/19 2000 113/54 36.4 C (97.5 F) TEMPORAL 94 17 96 %   11/01/19 1600 154/75 35.9 C (96.6 F) TEMPORAL 86 17 94 %   11/01/19 1531      99 %     O2 Flow Rate: 2 L/min (11/02/19 1200)       Mental Status: alert, oriented to person, place, and time, appropriate in conversation.  Mood is depressed.  Breathing deeply at rest without difficulty, but only able to pull >520ml on IS. No pain with deep inspiration or expiration.  Mildly ttp over the left  sub-acromial chest wall    IS Volume: >500    Cough Quality: weak    Intake/Output last 3 shifts:  I/O last 3 completed shifts:  06/21 0700 - 06/22 0659  In: 265 (2.2 mL/kg) [P.O.:240; IV Piggyback:25]  Out: 2060 (16.8 mL/kg) [Urine:2060 (0.7 mL/kg/hr)]  Net: -1795  Weight: 122.9 kg   Intake/Output this shift:  I/O this shift:  06/22 0700 - 06/22 1459  In: 600 (4.9 mL/kg) [P.O.:600]  Out: - (0 mL/kg)   Net: 600  Weight: 122.9 kg     Allergies:   Allergies   Allergen Reactions    Morphine Hives     Has tolerated Percocet       PRN Medications: (*Numbers appearing after the prn medications listed below indicate doses used since the patient was last seen by this service.)   oxyCODONE  5 mg Oral Q4H PRN    Or    oxyCODONE  10 mg Oral Q4H PRN    ondansetron  4 mg Intravenous Q6H PRN    sodium chloride  10 mL Intravenous PRN  And    sodium chloride  20 mL Intravenous PRN    albuterol  2.5 mg Nebulization Q6H PRN    sodium chloride  0-500 mL/hr Intravenous PRN    dextrose  0-500 mL/hr Intravenous PRN       IV Medications:       Scheduled Medications:    ibuprofen  600 mg Oral Q6H    tamsulosin  0.4 mg Oral QPM    budesonide  0.5 mg Nebulization 2 times per day    atorvastatin  40 mg Oral Daily    enoxaparin  30 mg Subcutaneous 2 times per day    lidocaine  1 patch Transdermal Daily    bisacodyl  10 mg Rectal Daily    polyethylene glycol  17 g Oral 2 times per day    acetaminophen  1,000 mg Oral Q8H    bacitracin zinc-polymyxin B sulfate   Topical 2 times per day    pantoprazole  20 mg Oral QAM    sertraline  100 mg Oral Daily    senna  2 tablet Oral 2 times per day    ipratropium-albuterol  3 mL Nebulization Q6H       Lab Results:   All labs in the last 24 hours:   Recent Results (from the past 24 hour(s))   Art gases / whole blood panel    Collection Time: 11/01/19  4:06 PM   Result Value Ref Range    Hemoglobin 11.3 11.2 - 15.7 g/dL    pH 7.47 (H) 7.35 - 7.45    pCO2, Arterial 45 35 - 46 mm Hg     pO2,Arterial 64 (L) 80 - 100 mm Hg    HCO3, Arterial 32 (H) 21.0 - 26.0 mmol/L    Base Excess, Arterial 8 (H) -3.0 - 2.0 mmol/L    CO2,ART (Calc) 34 (H) 23 - 28 mmol/L    FO2 Hb, Arterial 91 90 - 100 %    CO 1.7 %    Methemoglobin 0.3 0.0 - 1.0 %    NA, WB 136 135 - 145 mmol/L    Potassium,WB 4.4 3.3 - 4.6 mmol/L    ICA Uncorr,WB 5.0 4.8 - 5.2 mg/dL    ICA @7 .4,WB 5.1 4.8 - 5.2 mg/dL    Lactate ART,WB 1.0 (H) 0.3 - 0.8 mmol/L    Glucose,WB 114 (H) 60 - 99 mg/dL   CBC and differential    Collection Time: 11/02/19  1:21 AM   Result Value Ref Range    WBC 13.4 (H) 4.0 - 10.0 THOU/uL    RBC 3.8 (L) 3.90 - 5.20 MIL/uL    Hemoglobin 10.8 (L) 11.2 - 15.7 g/dL    Hematocrit 34 34.00 - 45.00 %    MCV 90 79.0 - 95.0 fL    MCH 29 26 - 32 pg    MCHC 32 32 - 36 g/dL    RDW 13.8 11.7 - 14.4 %    Platelets 326 160 - 370 THOU/uL    Seg Neut % 74.3 %    Lymphocyte % 11.5 %    Monocyte % 9.2 %    Eosinophil % 2.1 %    Basophil % 0.9 %    Neut # K/uL 9.9 (H) 1.6 - 6.1 THOU/uL    Lymph # K/uL 1.5 1.2 - 3.7 THOU/uL    Mono # K/uL 1.2 (H) 0.2 - 0.9 THOU/uL    Eos # K/uL 0.3 0.0 - 0.4 THOU/uL    Baso #  K/uL 0.1 0.0 - 0.1 THOU/uL    Nucl RBC % 0.0 0.0 - 0.2 /100 WBC    Nucl RBC # K/uL 0.0 0.0 - 0.0 THOU/uL    IMM Granulocytes # 0.3 (H) 0.0 - 0.0 THOU/uL    IMM Granulocytes 2.0 %   Basic metabolic panel    Collection Time: 11/02/19  1:21 AM   Result Value Ref Range    Glucose 101 (H) 60 - 99 mg/dL    Sodium 140 133 - 145 mmol/L    Potassium 4.7 3.3 - 5.1 mmol/L    Chloride 100 96 - 108 mmol/L    CO2 31 (H) 20 - 28 mmol/L    Anion Gap 9 7 - 16    UN 17 6 - 20 mg/dL    Creatinine 0.54 0.51 - 0.95 mg/dL    GFR,Caucasian 94 *    GFR,Black 108 *    Calcium 9.3 8.6 - 10.2 mg/dL   Magnesium    Collection Time: 11/02/19  1:21 AM   Result Value Ref Range    Magnesium 2.0 1.6 - 2.5 mg/dL   Phosphorus    Collection Time: 11/02/19  1:21 AM   Result Value Ref Range    Phosphorus 4.2 2.7 - 4.5 mg/dL         Lab results: 11/02/19  0121   WBC 13.4*    Hemoglobin 10.8*   Hematocrit 34   RBC 3.8*   Platelets 326           Lab results: 10/25/19  2048   Protime 11.6   INR 1.0       Assessment/Plan: 32F s/p fall w/ left posterior 2, 5, 6 rib fractures for which we placed a left sided T5 erector spinae catheter 6/15 that was removed on 6/17 to facilitate an MRI. The catheter seemed to offer little benefit for pain control, so it was not replaced. At the time, she was not able to pull more than 57ml on IS, but denied that this was limited by pain.   She is now in more pain after having her medications discontinued due to somnolence. She is currently awake and complains of pain on her left subacromial chest, in her neck, and abdomen. She denies pain elsewhere throughout her chest wall. She seems quite depressed, which contributes to perception of pain and inhibits progress. We are pleased to see that Psychiatry has seen her and we appreciate their recs.    Recommendations:  - continue acetaminophen  - continued scheduled NSAIDs if primary team is amenable  - gabapentin 100mg  TID  - oxycodone 2.5mg /5mg  q4-6h prn  - bowel regimen while taking opioids  - IS, aggressive pulmonary toilet  - please contact APS with worsening decline if respiratory function or any additional questions or concerns.    Shelda Altes, MD  Anesthesia Resident, [facility]  3:02 PM 11/02/2019

## 2019-11-02 NOTE — Consults (Signed)
Inpatient Psychiatric Consultation Note        Consult Requested by: Primary Team    Consult Question: depression    Admitting Diagnosis: Pt was admitted s/p fall 15 steps     Chief Complaint: "I'm in pain"    Patient information was obtained from patient and relative(s).  History/Exam limitations: none.    History of Present Illness:  Patient presents with depression. Onset of symptoms was gradual starting several months ago.  Symptoms are of moderate severity and are unchanged.  Patient states symptoms have been exacerbated by death of her husband in 2022/06/25 and medical issues resulting from this current fall. Symptoms are associated with no coherent plan to harm self.     Psychiatric Evaluation:    Pt is a 74 year old female with no formal psychiatric history who presented after a fall while attending a family party.  Psychiatry was consulted as pt had voiced thoughts of wanting to give up as well as concern for increased depression.  Upon meeting with pt, she engaged appropriately and was agreeable to writer coming back to see her.    Pt did note increased depression related to the fall and the anticipated recovery.  Pt reported that she has not had a significant injury like this before, even noting that her open heart surgery was not as bad as this.  She spoke of her husband's passing in 2022-06-25, noting that she is still working through her grief related to that.  She confirmed that she is on Zoloft to help with mood, something that was started prior to her husband's death.  Pt did state that prior to the fall, she had been doing better recently, noting increased socialization and trying to remain healthy.  She noted specifics such as getting out of the apartment more and taking the stairs instead of the elevator.  Pt reported that she slept better last night, she denied any nightmares or panic attacks.  She denied any acute anxiety.  Pt reported that she does not have much of an appetite, but relates this to  the pain.  Pt does have thoughts of wanting to give up, noting that she feels no end in sight in regards to the pain and when she will feel better physically.  She denied any active thoughts to harm herself and does not have a history of this.     Pt did express that she does want to work towards returning to her apartment, being able to walk, and engage in activities.  She is agreeable to a rehab setting.  Pt is future oriented.  Pt had been enjoying life a bit more prior to this fall.  She has been appreciative of support provided by her family.  Pt engaged appropriately throughout the assessment process.  Pt's sister in law was present towards the end of the assessment.  They laughed about going to various casino's together as that is one of their favorite things to do together.    Social/Developmental History:  Pt is a widowed (husband passed in 06-25-22 after illness) female with three adult children.  All of the children are in the area and supportive.  Pt has four grandchildren as well that she sees often.  After her husband's passing in 25-Jun-2022, she moved into a senior living apartment which to her report, she has enjoyed as she has been socializing more and taking care of herself.  In her free time, pt likes to go to the casino with her sister  in law, social activities through the apartment, and spending time with her family.    [name] - Daughter - [phone]  Writer called pt's daughter to obtain collateral with pt's permission.  Writer introduced self and role to [employer].  Joy reported that yesterday pt had made comments about not wanting to deal with this anymore which was concerning.  She was pleased to hear that psychiatry is involved at this time.  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  After his death she had moved into a senior living apartment where she had been taking care of herself more, ie:  joining [employer] which was a promise she had made to her husband, and being social within the apartment community.  Writer spoke about the appropriate feelings pt is having in regards to the fall and feeling that there is no light at the end of the tunnel.  Writer also informed her that pt is future oriented and does want to get better.    Mental Status Exam  Mental Status Exam  Appearance: Appropriately dressed  Relationship to Interviewer: Cooperative, Eye contact limited, Friendly  Psychomotor Activity: Decreased  Abnormal Movements: None  Speech : Normal tone, Normal rhythm  Language: Fluent, Normal comprehension, Normal repetition  Mood: Dysphoric  Affect: Appropriate  Thought Process: Logical, Sequential, Goal-directed  Thought Content: No suicidal ideation, No homicidal ideation, No delusions, No obsessions/compulsions  Perceptions/Associations : No hallucinations  Sensorium: Alert, Oriented x3  Cognition: Recent memory intact, Remote memory intact, Fair attention span  [employer] of Knowledge: Normal  Insight : Good  Judgement: Good    Psychiatric History:  Current Psych Care: no  Past Psych Care: no      Psychotherapy: no      Medication trials: Pt was started on Zoloft prior to her husband's passing due to being his sole caregiver, pt's dose was recently increased from 50 mg to 100 mg at the end of April by her PCP  Previous physical/sexual abuse: none  Previous psychiatric hospitalizations: no  Previous suicide attempts: no  Substance use history and treatment:  n/a  Family psychiatric history:  none    Past Medical History:   Diagnosis Date    Allergic rhinitis 09/30/2014    Arthritis     Asthma     CAD (coronary artery disease)     DVT (deep venous thrombosis)     post-op after CABG, on anticoagulation for ~2 years    PE (pulmonary embolism)     post-op after CABG, on anticoagulation for ~2 years    Prediabetes     Tubular adenoma 03/2015    colonoscopy      Past Surgical History:   Procedure  Laterality Date    ACHILLES TENDON SURGERY      APPENDECTOMY      CHOLECYSTECTOMY      CORONARY ANGIOPLASTY WITH STENT PLACEMENT      CORONARY ARTERY BYPASS GRAFT  2013    triple     CYST REMOVAL      both thumbs    PR REPAIR EYE BLOWOUT,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    Procedure: ORBIT ORIF;  Surgeon: Jannett Celestine, DDS;  Location: [facility] MAIN OR;  Service: OMFS    TONSILLECTOMY AND ADENOIDECTOMY       Family History   Problem Relation Age of Onset    Other Mother         "  rare lung disease"    Aneurysm Mother         abdominal aortic aneurysm    Diabetes Type II Mother     Breast cancer Mother     Blood clots Mother     Stroke Father     Stroke Brother         x2    Thyroid cancer Daughter      Social History     Socioeconomic History    Marital status: Widowed     Spouse name: Not on file    Number of children: Not on file    Years of education: Not on file    Highest education level: Not on file   Tobacco Use    Smoking status: Never Smoker    Smokeless tobacco: Never Used   Substance and Sexual Activity    Alcohol use: No    Drug use: No    Sexual activity: Not on file   Other Topics Concern    Not on file   Social History Narrative    May 2016        Lives with her husband in an in law suite at her son's house        Three children, all live locally     Five grandchildren     Retired from school food service       Allergies:   Allergies   Allergen Reactions    Morphine Hives     Has tolerated Percocet       Medications:  Medications Prior to Admission   Medication Sig    atorvastatin (LIPITOR) 40 MG tablet TAKE ONE TABLET BY MOUTH ONCE DAILY    budesonide (PULMICORT) 0.25 MG/2ML nebulizer solution Inhale 0.25 mg into the lungs daily    budesonide-formoterol (SYMBICORT) 160-4.5 MCG/ACT inhaler Inhale 2 puffs into the lungs 2 times daily    albuterol HFA (PROVENTIL, VENTOLIN, PROAIR HFA) 108 (90 Base) MCG/ACT inhaler Inhale 2 puffs into the lungs every 6 hours as needed     montelukast (SINGULAIR) 10 MG tablet Take 1 tablet (10 mg total) by mouth nightly    sertraline (ZOLOFT) 100 MG tablet Take 1 tablet (100 mg total) by mouth daily Replacing 50mg  dose, please cancel other refills    fluticasone-umeclidinium-vilanterol (TRELEGY ELLIPTA) 200-62.5-25 MCG/INH diskus inhaler Inhale 1 puff into the lungs daily    albuterol HFA (PROVENTIL, VENTOLIN, PROAIR HFA) 108 (90 Base) MCG/ACT inhaler Inhale 2 puffs into the lungs every 4-6 hours as needed    gabapentin (GABAPENTIN) 300 MG capsule Take 1 capsule (300 mg total) by mouth 3 times daily    pantoprazole (PROTONIX) 20 MG EC tablet Take 1 tablet (20 mg total) by mouth daily 30 min before breakfast Swallow whole. Do not crush, break, or chew.    azelastine (ASTELIN) 0.1 % nasal spray 1-2 sprays by Nasal route 2 times daily    Spacer/Aero-Holding Chambers (EASIVENT) spacer Use twice daily with Symbicort inhaler. Breathe in slowly.    fluticasone (FLONASE) 50 MCG/ACT nasal spray 2 sprays by Nasal route daily    Non-System Medication Medication/Supply: R thumb spica brace  Directions for Use: wear nightly    ipratropium-albuterol (DUONEB) 0.5-2.5mg  /59mL nebulizer solution Take 3 mLs by nebulization every 6 hours    ipratropium (ATROVENT) 0.06 % nasal spray 2 sprays by Each Nare route 3 times daily    Misc. Devices (CPAP) machine Download and compliance report    atorvastatin (LIPITOR) 40 MG tablet  Take 40 mg by mouth daily (with dinner)    Cholecalciferol (VITAMIN D3) 5000 UNITS TABS Take 5,000 Units by mouth every morning       docusate sodium (COLACE) 100 MG capsule Take 200 mg by mouth every morning       clopidogrel (PLAVIX) 75 MG tablet Take 75 mg by mouth every morning        Current Facility-Administered Medications   Medication Dose Route Frequency    ibuprofen (ADVIL,MOTRIN) tablet 600 mg  600 mg Oral Q6H    tamsulosin (FLOMAX) 24 hr capsule 0.4 mg  0.4 mg Oral QPM    budesonide (PULMICORT) nebulizer solution 0.5 mg   0.5 mg Nebulization 2 times per day    atorvastatin (LIPITOR) tablet 40 mg  40 mg Oral Daily    enoxaparin (LOVENOX) injection 30 mg  30 mg Subcutaneous 2 times per day    lidocaine (LIDODERM) 5 % patch 1 patch  1 patch Transdermal Daily    bisacodyl (DULCOLAX) suppository 10 mg  10 mg Rectal Daily    polyethylene glycol (GLYCOLAX,MIRALAX) powder 17 g  17 g Oral 2 times per day    acetaminophen (TYLENOL) tablet 1,000 mg  1,000 mg Oral Q8H    ondansetron (ZOFRAN) injection 4 mg  4 mg Intravenous Q6H PRN    albuterol (PROVENTIL) nebulization 2.5 mg  2.5 mg Nebulization Q6H PRN    bacitracin zinc-polymyxin B sulfate (POLYSPORIN) ointment   Topical 2 times per day    pantoprazole (PROTONIX) EC tablet 20 mg  20 mg Oral QAM    sertraline (ZOLOFT) tablet 100 mg  100 mg Oral Daily    senna (SENOKOT) tablet 2 tablet  2 tablet Oral 2 times per day    ipratropium-albuterol (DUONEB) 0.5-2.5mg  /99mL nebulization solution 3 mL  3 mL Nebulization Q6H    sodium chloride 0.9 % FLUSH REQUIRED IF PATIENT HAS IV  0-500 mL/hr Intravenous PRN    dextrose 5 % FLUSH REQUIRED IF PATIENT HAS IV  0-500 mL/hr Intravenous PRN       Recommendations  - No acute psychiatric concerns, pt does not warrant an inpatient psychiatric hospitalization  - No adjustment to medications at this time, pt's medication was increased about two months ago, had been becoming more social/engaged in activities  - Pt has continued to process the death of her husband, still appropriately grieving, as noted by her PCP, we do not want to over medicate which could result in pt not grieving appropriately  - Pt does feel increased sadness related to her fall, but is future oriented, appropriate given circumstances  - Writer will continue to provide support on a regular basis    Author: Germaine Pomfret, LMSW  as of: 11/02/2019  at: 11:20 AM

## 2019-11-02 NOTE — Progress Notes (Addendum)
BTICU Progress Note     LOS: 8 days     Interval History:   Afebrile, HDS  Laying in bed comfortably in NAD in c-collar using CPAP  Pain controlled under current regimen   Endorses improved PO intake w/ magic cups  Urinary retention w/ bladder scan vol >600 o/n requiring straight cath 1x    Paged by nursing to evaluate patient for aniscoria. Upon arrival, R pupil ~4, L pupil ~2. Reactive to light bilaterally. Patient mentating appropriately, following commands, and w/o focal deficits.  NSGY aware. Likely related to Nebulizers received overnight and anticipated to resolve by morning.     Objective Section:  Temp:  [35.7 C (96.3 F)-36.5 C (97.7 F)] 35.7 C (96.3 F)  Heart Rate:  [75-87] 78  Resp:  [13-17] 16  BP: (121-174)/(59-135) 166/89  FiO2:  [2 %] 2 %    Intake and Output:     Intake/Output Summary (Last 24 hours) at 11/02/2019 2202  Last data filed at 11/02/2019 1659  Gross per 24 hour   Intake 600 ml   Output 1440 ml   Net -840 ml       Nursing pain score: Last Nursing documented pain:  0-10 Scale: 3 (11/02/19 2144)    Physical Exam by Systems:  General: NAD resting comfortably in bed w/ C-collar and CPAP  Heart: RRR   Lungs: CTAB   Abdomen:  Soft, ND/NT, +BS   Ext: Edema Bilaterally, WWP bilaterally   Neuro: AAOX3, w/o deficits       Lab Results:   Heme:  Recent Labs   Lab 11/02/19  0121 11/01/19  1606 11/01/19  0039 10/30/19  2159 10/30/19  2159   WBC 13.4*  --  11.1*  --  11.1*   Hemoglobin 10.8*   < > 10.1*   < > 9.9*   Hematocrit 34  --  33*  --  33*   Platelets 326  --  287  --  231    < > = values in this interval not displayed.   Chem:  Recent Labs   Lab 11/02/19  0121 11/01/19  0039 11/01/19  0039 10/30/19  2159 10/30/19  2159   Sodium 140  --  139  --  139   Potassium 4.7   < > 5.1   < > 4.4   Chloride 100  --  99  --  99   CO2 31*  --  30*  --  28   UN 17  --  16  --  21*   Creatinine 0.54  --  0.54  --  0.60   Glucose 101*  --  130*  --  179*   Calcium 9.3  --  9.3  --  8.9   Magnesium 2.0  --   1.8  --  2.2   Phosphorus 4.2  --  4.0  --  3.9    < > = values in this interval not displayed.   No results for input(s): PGLU in the last 168 hours.LFT's:  Recent Labs   Lab 10/26/19  2319   Amylase 27*   Lipase 12*   ABG:  Recent Labs   Lab 11/01/19  1606   pH 7.47*   pCO2, Arterial 45   pO2,Arterial 64*   Base Excess, Arterial 8*   CO 1.7     Routine Medications:    ibuprofen  600 mg Oral Q6H    gabapentin  100 mg Oral TID  tamsulosin  0.4 mg Oral QPM    budesonide  0.5 mg Nebulization 2 times per day    atorvastatin  40 mg Oral Daily    enoxaparin  30 mg Subcutaneous 2 times per day    lidocaine  1 patch Transdermal Daily    bisacodyl  10 mg Rectal Daily    polyethylene glycol  17 g Oral 2 times per day    acetaminophen  1,000 mg Oral Q8H    bacitracin zinc-polymyxin B sulfate   Topical 2 times per day    pantoprazole  20 mg Oral QAM    sertraline  100 mg Oral Daily    senna  2 tablet Oral 2 times per day    ipratropium-albuterol  3 mL Nebulization Q6H         PRN Medications:   oxyCODONE  2.5 mg Oral Q6H PRN    Or    oxyCODONE  5 mg Oral Q6H PRN    haloperidol lactate  1 mg Intravenous Q6H PRN    ondansetron  4 mg Intravenous Q6H PRN    albuterol  2.5 mg Nebulization Q6H PRN    sodium chloride  0-500 mL/hr Intravenous PRN    dextrose  0-500 mL/hr Intravenous PRN       Assessment: [name] is a 74 y.o. female L2T fall from 15 stairson6/15    Type III odontoid fx  Scalp lac   Periaortic fat stranding   ?lumbar vein extrav  Splenic lac,   ?Pancreatic injury,   L 2, 5-6 rib fx   Small extrapleural hematoma    Currently Active/Followed Hospital Problems:  Active Hospital Problems    Diagnosis     *!*Fall from 15 stairs 6/15 type III odontoid fx, scalp lac, ?lumbar vein extrav, periaortic fat stranding, splenic lac, ?pancreatic injury, L 2, 5-6 rib fx, small extrapleural hematoma      PLAN:    Pulm: L 2, 5-6  rib fractures; Hx OSA (on CPAP), COPD   -NC 2L wean as  tolerated  -Chest PT, IS, Flutter valve  -Home albuterol, pulmicort, duonebs  -CPAP qhs;  nasal mask if available      CVS: HTN  -Lipitor 40  -Hold home plavix      BP  Min: 121/59  Max: 174/82  Pulse  Avg: 81.6  Min: 75  Max: 87       Renal/E/F: Urinary Retention  -Flomax  -D/c'd foley  -Bladder scan and Straight cath prn; straight cath'd o/n for vol >600  BMP MWF        Intake/Output Summary (Last 24 hours) at 11/02/2019 2207  Last data filed at 11/02/2019 1659  Gross per 24 hour   Intake 600 ml   Output 1440 ml   Net -840 ml       Recent Labs   Lab 11/02/19  0121 11/01/19  0039   UN 17 16   Creatinine 0.54 0.54   Potassium 4.7 5.1       GI/Nutrition: No active issues   Diet regular  Dietary nutrition supplements adult:  Dietary nutrition supplements adult:      -Try magic cups and ensure  -Bowel regimen  -Continue Home PPI  -BM +  -Zofran prn      ID: afebrile, increasing leukocytosis       -CTM fever/leukocytosis curve  -Covid negative 6/14      Temp (24hrs), Avg:36.1 C (97 F), Min:35.7 C (96.3 F), Max:36.5 C (97.7 F)  Recent Labs   Lab 11/02/19  0121 11/01/19  0039   WBC 13.4* 11.1*       Heme: anemia   -No active issues  -H/H stable  -CBC Daily  -lovenox    Recent Labs   Lab 11/02/19  0121 11/01/19  0039 11/01/19  0039   Hematocrit 34   < > 33*   Platelets 326  --  287    < > = values in this interval not displayed.         Endo: No active issue   - CTM glucose      Neuro/pain: acute pain; chest wall analgesia/ depression    -tylenol ATC   -Motrin ATC (alternate w/ tylenol)  -Lidocaine patches  -APS consult - recc gabapentin 100mg  TID; oxycodone 2.5/5mg  q4-6h prn; bowel regimen while on opioids  -Continue home sertraline  -Hold gabapentin 2/2 lethargy        Integument: Routine wound and ICU care.    Musculoskeletal: type III odontoid fx   - NSGY: c-collar 4-6 wks/ AAT  -PT/OT: acute rehab  - PMR: awaiting reccs  - Psych- for depression- no acute psych concerns, no adjustment  To meds  needed      Prophylaxis: lovenox    Author: Arizona Constable, MD  as of: 11/02/2019  at: 10:02 PM     Burn / Trauma ICU Fellow Plan:    I have seen and evaluated the patient personally on rounds with the resident/attending/PA/NP ICU team. I personally examined the patient.  All nursing documentation, laboratory data, test results, and radiographs were reviewed and interpreted by me. I agree with the database, findings, and plan of care recorded in the above note. I have reviewed and edited the above note to reflect my findings as well.  I have established the management plan for this patient's critical illness with the ICU team and have been immediately available to assist with patient care. Additional details are highlighted are within my note below.     Respiratory: Acute pulmonary insufficiency, rib fx, OSA, COPD  - NC 3L, wean as tolerated (goal SpO2 >88% given hx of COPD)  - IS, chest PT  - continue home albuterol, pulmicort, duonebs  - CPAP qhs   Acute Pulmonary Insufficiency -- Yes   Acute Respiratory Failure: No  Ventilator dependent: No    CV: HTN, HLD  - lipitor 40  - start home plavix   Shock: No Dysrrythmia/Arrhythmia: No    GU/Fluids: urinary retention  - flomax  - bladder scan and straight cath prn (bladder volume >250 or no void >8hrs)  - BMP MWF   Acute renal injury: No Chronic renal failure: No Electrolyte abnormality: Yes Need to keep foley? no    GI/Nutrition: no acute issues  - regular diet, magic cup  - bowel regimen  - home PPI  - zofran prn   On GI prophylaxis: Yes, Moderate malnutrition: No     ID: afebrile leukocytosis  - no concern for infection currently, hold abx  SIRS: No Sepsis: No Severe Sepsis: No  Pneumonia: No  COVID positive: No  Last COVID test date: 06/14    Heme: no active issues  - H&H stable  - lovenox ppx  - CBC daily; will space out if stable tomorrow   On DVT Prophylaxis: Yes Acute blood loss anemia: No Coagulopathy: No     Endocrine: no active issues   Hyperglycemia -- No   Insulin regimen -- No  Synthroid -- No  Neuro/Sedation: pain, depression  - tylenol ATC, motrin ATC  - lidocaine patches  - start gabapentin 100 TID  - oxy prn   - home sertraline  - C collar x4-6 weeks  Encephalopathy: Yes Coma: NoBrain compression: No    Exts/Consults/Other: APS, psych, PT/OT, PM&R     Social/Family/Dispo:   - likely d/c to acute rehab when ready  Family present at bedside today?  Yes sister    Signed by: Bonney Roussel, MD  as of 11/03/2019 at 11:27 AM     Burn / Trauma ICU Attending Assessment and  Plan:    I have seen and evaluated the patient personally on rounds with the resident/Fellow/PA/NP ICU team. I personally examined the patient.  All nursing documentation, laboratory data, test results, and radiographs were reviewed and interpreted by me.I agree with the database, findings, and plan of care recorded in the above note. I have reviewed and edited the above note to reflect my findings as well.  I have established the management plan for this patient's critical illness with the ICU team and have been immediately available to assist with patient care.     Attending Attestation:    This is a high complexity patient.     [   ] This patient is critically ill with at least 1 organ system failure associated with a high probability of life threatening deterioration. The care I have delivered involved high complexity decision making to assess, manipulate, and support vital system function(s), to treat the vital organ system failure and/or prevent further deterioration of the patient's condition. All nursing documentation, laboratory data, test results, and radiographs were reviewed and interpreted by me during multidisciplinary rounds. I have established the management plan for this patient's critical illness and have been immediately available t2o assist with patient care. My documented total critical care time reflects my own patient care time and does not include teaching or  procedure time.     Critical care time since midnight/this 24 hour period: Personal time spent -    minutes.  This critical care time is exclusive of any time for separately billable procedures and time spent teaching      Signed by: Dorathy Kinsman, MD as of 11/03/2019 at 3:47 PM

## 2019-11-02 NOTE — Plan of Care (Signed)
Problem: Impaired ADL  Goal: Increase ADL Independence  Intervention: ADL Retraining  Note: In 6 visits:  Pt will perform rolling bedlevel Min A to assist with self care tasks   Pt will perform UB dressing Min A   Pt perform LB dressing Max A with LHAE as needed   Pt will perform sit > stand with Max A in prep for commode txfer

## 2019-11-02 NOTE — Progress Notes (Signed)
Occupational Therapy Evaluation        Discharge Recommendations:  Would appreciate PM&R consult , Rehab   Equipment Recommendations: TBD in next setting    Hospital Stay Recommendations:  Encourage active participation with ADLs and Toilet transfer status bedpan      HPI:  Admitting Dx: Fall from 15 stairs 6/15 with  type III odontoid fx, scalp lac, ?lumbar vein extrav, periaortic fat stranding, splenic lac, ?pancreatic injury, L 2, 5-6 rib fx, small extrapleural hematoma.     PMH:   Past Medical History:   Diagnosis Date    Allergic rhinitis 09/30/2014    Arthritis     Asthma     CAD (coronary artery disease)     DVT (deep venous thrombosis)     post-op after CABG, on anticoagulation for ~2 years    PE (pulmonary embolism)     post-op after CABG, on anticoagulation for ~2 years    Prediabetes     Tubular adenoma 03/2015    colonoscopy        PSH:   Past Surgical History:   Procedure Laterality Date    ACHILLES TENDON SURGERY      APPENDECTOMY      CHOLECYSTECTOMY      CORONARY ANGIOPLASTY WITH STENT PLACEMENT      CORONARY ARTERY BYPASS GRAFT  2013    triple     CYST REMOVAL      both thumbs    PR REPAIR EYE BLOWOUT,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    Procedure: ORBIT ORIF;  Surgeon: Jannett Celestine, DDS;  Location: [facility] MAIN OR;  Service: OMFS    TONSILLECTOMY AND ADENOIDECTOMY         ASSESSMENT       11/02/19 1020   Visit Details [facility])   Visit Type ([place]) Eval-General   Tx Prioritization 3 - Moderate   OT Tracking   OT Tracking OT Assigned   Plan and Onset date   Plan of Care Date 11/02/19   Onset Date 10/25/19   Treatment Start Date 11/02/19   OT Last Visit   Visit (#) of Five 1   Precautions   Precautions used Yes   Brace Applied Cervical collar    Fall Precautions General falls precautions   LDA Observation IV lines;O2 (comment);Monitors;Foley cath  (on 2.5L via NC )   Other AAT    Patient Wearing Mask No   Writer wearing PPE including Gloves;Goggles;Mask   Vitals Vital signs stable   Home  Living (Prior to Admission)   Prior Living Situation Reported by patient   Type of Home Apartment  (ILF )   Location of Bedroom Second floor  (elevator access to 2nd floor apt )   Location of Bathroom Second floor  (elevator access to 2nd floor apt )   # Steps to Enter Home 0   # Of Steps In Home 0   Bathroom Shower/Tub Tub/Shower unit   [employer] Grab bars in shower;Grab bars around toilet   Prior Function   Prior Function Reported by patient   Level of Independence Independent with ADLs;Independent ambulation;Driving;Needs assistance with homemaking   Lives With Alone   Receives Help From Personal care attendant  (2 x week for 5 hours )   IADL Needs assistance   Additional Comments Pt was indep all ADLs and mobility. Aides assits wit hheavy cleaning and laundry. Aides also provide 7 meals/ week.    Pain Assessment   *Is the patient currently in pain? Yes  Additional Comments neck pain 4/10 using 0-10 pain scale, refer to nursing for pain mgmt    Vision    Current Vision Wears corrective lenses   Cognition   Level of Alertness Appropriate responses to stimuli   Orientation A&Ox4   Attention  Impaired focal  (d/t lethargy )   Following Commands Follows one step commands 100% of the time;Minimal verbal cues required   Additional Comments Pt cooperative throughout session however lethargic and closing eyes throughout session. Able to open eyes and follow commands with min cues. Aware of call bell use.    Sensation   Sensation No apparent deficit   UE Assessment   UE Assessment Full AROM RUE;Full AROM LUE   Additional Comments BUE AROM WFL except shoulder flexion only to 90 degrees 2/2 cspine precautions. Grasp, elbow flex/ ext strength 4+/5. Shoulder strength NT d/t cspine precautions.   Bed Mobility   Rolling for Self Care Maximal Assist to Left;Maximal Assist to Right  (with rail )   Additional Comments Limited by weakness and neck pain.    Functional Transfers   Additional Comments NT   ADL Assessment      Grooming Minimal Assist  (washing face, oral care . Min A to comb hair 2/2 staples. )   Where Grooming Assessed Supine, bed   UE Dressing Moderate Assist  (pull down back )   LE Dressing Dependent  (bedlevel)   Toileting Dependent  (foley, incontinent of stool upon OT arrival. 2 assist for hygiene (1 for hygiene and 1 for maintaining pt in side lying position)    OT Functional Outcome Measures   Functional Outcome Measures Yes   OT AM-PAC Self Care   Putting on and taking off regular lower body clothing? 1   Bathing (including washing, rinsing, drying)? 2   Toileting, which includes using toilet, bedpan, or urinal? 1   Putting on and taking off regular upper body clothing? 2   Taking care of personal grooming such as brushing teeth? 3   Eating Meals 3   Total Raw Score 12   CMS Score - Calculated 66.57%   Assessment   Assessment Impaired ADL status;Impaired UE strength;Impaired balance;Impaired endurance;Impaired cognition;Impaired self-care transfers;Impaired instrumental ADL's   Plan   OT Frequency 3-5x/wk   Patient Will Benefit From ADL retraining;Functional transfer training;Endurance training;Cognitive re-education;Patient/Family training;Equipment eval/education;Gross motor activities;Compensatory technique education;IADL training;Community re-entry;Exercises   Additional Comments    Multidisciplinary Communication   Multidisciplinary Communication pt, RN   Recommendation   OT Discharge Recommendations   (Rehab, PM&R consult )   Additional Comments          OCCUPATIONAL THERAPY PROVIDER     Electronically Signed By:   Harrison Mons, OT    Please contact the OT pager [facility]) for all questions/concerns and/or update requests.    Timed Calculations:  Timed Codes:  0  Untimed Codes: 20 min OT eval   Unbilled Time: 0  Total Time:  20 min     OT EVALUATION COMPLEXITY     1.  Occupational Profile & History (Medical/Therapy)   Moderate (Expanded Review)    2.  Pension scheme manager (Includes occupations from:  ADL, IADL, Rest/Sleep, Education, Work, Systems analyst, Leisure, Social Participation)   High (5+ occupations/performance deficits)    3.  Clinical Decision Making   i  Assessment    Moderate (Detailed)   ii  Co-Morbidities    Moderate (1+)   iii  Modifications    Moderate (Minimum - Moderate)  iv Treatment Options (Approaches include: Create, Promote, Establish, Restore, Maintain, Modify, Prevent)    Moderate (Several)     Moderate    4.  EVALUATION COMPLEXITY as based on the above provided information   Moderate

## 2019-11-02 NOTE — Progress Notes (Signed)
Report Given To  [place] LPN      Margaretmary Bayley, RN        Descriptive Sentence / Reason for Admission   [name] is a 74 y.o. female w/pmhx CAD s/p CABG 2012 in [place] and recent stenting angio 2015 on plavix, COPD, OSA, DM who presented as level II Trauma after fall down 12 stairs at nephew's house during family party.     Injuries:     - right scalp hematoma  - left parietal scalp laceration  - type 3 odontoid fx  - left rib fracture  - splenic laceration    Vascular surgery consulted for assessment and management.         Active Issues / Relevant Events   615N: Admit to [place]. Hypotensive 60/30s with AMS; BP then 80/40s after 2 x 1 L LR fluid boluses given. Given 1 unit PRBC and 1 unit platelets. EKG with trops sent.  R fem triple lumen CVC placed and L fem a-line placed. STAT CTA head/chest/abdomen completed. COVID NEG.  6/15D: Echo done 1015 Erector Spinae Block placed by APS 1550  6/16N: Hypertensive SBPs 170-180s. Given prn 0.5 dilaudid x 1 and 5 mg oxy with no therapeutic effect. Provider notified; pt given 1 time dose 0.25 dilaudid, 10 mg oxy and bupivicaine bolus on PCEA. EKG completed.   6/17: APS Block d/c for MRI. MRI C-Spine completed.   6/18: MIVF. Foley. Desatted x4 high 70s- chest xray and CPAP. 10mg  oxy given- pt lethargic.  6/19D: Flomax started.   6/20-21: VS WDL on 2L NC A&O x3. Drowsy throughout most of shift, sleeping but awakens easily and answers questions appropriately, Complaint of neck pain PRN Ibuprofen x1, complaint of nausea, PRN zofran x2, Refused CPAP overnight, Adequate UO  6/21 4315-4008: Admit to 12-3598. Emesis x1. Phenergan x1. SBP 160's-Hydralazine x1 with minimal effect.   6/22N: Zofran given x1 for nausea. Refused CPAP overnight. .25 Dilaudid & PRN Motrin given for Neck and Back pain.       To Do List  Q4h VS  Q1h I&O  Q4h Neuro checks  Regular Diet- Calorie counts started 6/18  Daily labs  OOB daily  CPAP HS    BTICU APP [id number]  BTICU Resident  [id number]  [place] [phone]      Anticipatory Guidance / Discharge Planning  PIV x2        Daughter [name] took Gold ring and opal earrings home 6/15

## 2019-11-02 NOTE — Progress Notes (Signed)
Report Given To  [place], RN            Descriptive Sentence / Reason for Admission   [name] is a 74 y.o. female w/pmhx CAD s/p CABG 2012 in [place] and recent stenting angio 2015 on plavix, COPD, OSA, DM who presented as level II Trauma after fall down 12 stairs at nephew's house during family party.     Injuries:     - right scalp hematoma  - left parietal scalp laceration  - type 3 odontoid fx  - left rib fracture  - splenic laceration    Vascular surgery consulted for assessment and management.         Active Issues / Relevant Events   615N: Admit to [place]. Hypotensive 60/30s with AMS; BP then 80/40s after 2 x 1 L LR fluid boluses given. Given 1 unit PRBC and 1 unit platelets. EKG with trops sent.  R fem triple lumen CVC placed and L fem a-line placed. STAT CTA head/chest/abdomen completed. COVID NEG.  6/15D: Echo done 1015 Erector Spinae Block placed by APS 1550  6/16N: Hypertensive SBPs 170-180s. Given prn 0.5 dilaudid x 1 and 5 mg oxy with no therapeutic effect. Provider notified; pt given 1 time dose 0.25 dilaudid, 10 mg oxy and bupivicaine bolus on PCEA. EKG completed.   6/17: APS Block d/c for MRI. MRI C-Spine completed.   6/18: MIVF. Foley. Desatted x4 high 70s- chest xray and CPAP. 10mg  oxy given- pt lethargic.  6/19D: Flomax started.   6/20-21: VS WDL on 2L NC A&O x3. Drowsy throughout most of shift, sleeping but awakens easily and answers questions appropriately, Complaint of neck pain PRN Ibuprofen x1, complaint of nausea, PRN zofran x2, Refused CPAP overnight, Adequate UO  6/21 2671-2458: Admit to 12-3598. Emesis x1. Phenergan x1. SBP 160's-Hydralazine x1 with minimal effect.   6/22N: Zofran given x1 for nausea. Refused CPAP overnight. .25 Dilaudid & PRN Motrin given for Neck and Back pain.   6/22D: Seen by OT. Zofran for nausea x1. Anesthesia consult > new pain med regime. Foley removed, Purewick placed- DTV 2200.      To Do List  Q4h VS  Q1h I&O  Q4h Neuro checks  Regular Diet-  Calorie counts started 6/18  Daily labs  OOB daily  CPAP HS    BTICU APP [id number]  BTICU Resident [id number]  [place] [phone]      Anticipatory Guidance / Discharge Planning  PIV x2        Daughter [name] took Gold ring and opal earrings home 6/15

## 2019-11-03 LAB — CBC AND DIFFERENTIAL
Baso # K/uL: 0.1 10*3/uL (ref 0.0–0.1)
Basophil %: 1 %
Eos # K/uL: 0.3 10*3/uL (ref 0.0–0.4)
Eosinophil %: 2.6 %
Hematocrit: 34 % (ref 34–45)
Hemoglobin: 10.9 g/dL — ABNORMAL LOW (ref 11.2–15.7)
IMM Granulocytes #: 0.4 10*3/uL — ABNORMAL HIGH (ref 0.0–0.0)
IMM Granulocytes: 3.3 %
Lymph # K/uL: 1.9 10*3/uL (ref 1.2–3.7)
Lymphocyte %: 14.5 %
MCH: 29 pg (ref 26–32)
MCHC: 32 g/dL (ref 32–36)
MCV: 91 fL (ref 79–95)
Mono # K/uL: 1.1 10*3/uL — ABNORMAL HIGH (ref 0.2–0.9)
Monocyte %: 8.6 %
Neut # K/uL: 9 10*3/uL — ABNORMAL HIGH (ref 1.6–6.1)
Nucl RBC # K/uL: 0 10*3/uL (ref 0.0–0.0)
Nucl RBC %: 0 /100 WBC (ref 0.0–0.2)
Platelets: 360 10*3/uL (ref 160–370)
RBC: 3.8 MIL/uL — ABNORMAL LOW (ref 3.9–5.2)
RDW: 13.7 % (ref 11.7–14.4)
Seg Neut %: 70 %
WBC: 12.8 10*3/uL — ABNORMAL HIGH (ref 4.0–10.0)

## 2019-11-03 LAB — BASIC METABOLIC PANEL
Anion Gap: 9 (ref 7–16)
CO2: 29 mmol/L — ABNORMAL HIGH (ref 20–28)
Calcium: 9.4 mg/dL (ref 8.6–10.2)
Chloride: 98 mmol/L (ref 96–108)
Creatinine: 0.55 mg/dL (ref 0.51–0.95)
GFR,Black: 108 *
GFR,Caucasian: 93 *
Glucose: 105 mg/dL — ABNORMAL HIGH (ref 60–99)
Lab: 18 mg/dL (ref 6–20)
Potassium: 4.6 mmol/L (ref 3.3–5.1)
Sodium: 136 mmol/L (ref 133–145)

## 2019-11-03 LAB — PHOSPHORUS: Phosphorus: 4.1 mg/dL (ref 2.7–4.5)

## 2019-11-03 LAB — MAGNESIUM: Magnesium: 2 mg/dL (ref 1.6–2.5)

## 2019-11-03 MED ORDER — CLOPIDOGREL BISULFATE 75 MG PO TABS *I*
75.0000 mg | ORAL_TABLET | Freq: Every day | ORAL | Status: DC
Start: 2019-11-03 — End: 2019-11-09
  Administered 2019-11-03 – 2019-11-09 (×7): 75 mg via ORAL
  Filled 2019-11-03 (×8): qty 1

## 2019-11-03 NOTE — Progress Notes (Signed)
Neurosurgery Brief Note    Called regarding patient complaining of burning sensation on the back of her neck.  Patient examined with nursing at bedside.  No complaint of burning on neck per nursing.  Patient remains lethargic but following commands in all extremities with full strength in all muscle groups.  No neck tenderness to touch.  No posterior skin tears noted under cervical collar.    Continue cervical collar as previously discussed.      Fayette Pho MD  Neurosurgery Resident

## 2019-11-03 NOTE — Progress Notes (Addendum)
Acute Pain Service Adult Daily Progress Note for Inpatients  Hospital-day:  LOS: 9 days     Closed odontoid fracture with type III morphology, initial encounter    [name] was previously seen by APS after her arrival in the [place] s/p fall w/ left posterior 2, 5, 6 rib fractures for which we placed a left sided T5 erector spinae catheter 6/15 that was removed on 6/17 to facilitate an MRI. The catheter seemed to offer little benefit for pain control, so it was not replaced. At the time, she was not able to pull more than 588ml on IS, but denied that this was limited by pain.   On 6/22 we were asked to see Latoya Dowty again for increased pain after her medications were discontinued due to somnolence. We recommended restarting gabapentin 100mg  and oxycodone 2.5mg /5mg  prn. She is currently awake, but drowsy and complains of 8/10 pain in her neck at rest, 0/10 in her ribs at rest and with deep breathing, and 8/10 in her left anterolateral chest wall with movement.    Pain Scores:   Rest:4/10   Cough/Move:6/10   24hr High:10/10    Last Nursing documented pain:  0-10 Scale: 6 (11/03/19 1000)      Significant 24hr Events:  Fever: no   Anticoagulation: holding plavix through discharge, on ppx lovenox    Vitals:  Patient Vitals for the past 24 hrs:   BP Temp Temp src Pulse Resp SpO2   11/03/19 1000 145/68   79 18 97 %   11/03/19 0900 144/66   79 20 98 %   11/03/19 0800 161/76 36.1 C (96.9 F) TEMPORAL 71 13 97 %   11/03/19 0700 147/75   81 19 98 %   11/03/19 0600 142/67   81 16 97 %   11/03/19 0500 139/67   71 15 95 %   11/03/19 0400 136/62   75 14 96 %   11/03/19 0300 149/68   74 24 94 %   11/03/19 0200 141/69   79 14 97 %   11/03/19 0111      98 %   11/03/19 0100 151/77   84 22 94 %   11/03/19 0000 158/73   79 22 94 %   11/02/19 2300 151/74 35.6 C (96.1 F) TEMPORAL 81 24 94 %   11/02/19 2208      93 %   11/02/19 2200 146/83   83 14 97 %   11/02/19 2144 166/89   78 16 98 %   11/02/19  2013      95 %   11/02/19 2000 147/80 35.7 C (96.3 F) TEMPORAL 75 16 96 %   11/02/19 1848 174/82   76 17 96 %   11/02/19 1800 (!) 155/135   84 16 97 %   11/02/19 1600 (!) 171/91 36.4 C (97.5 F) TEMPORAL 86 15 96 %   11/02/19 1200 158/87 36 C (96.8 F) TEMPORAL 85 16 94 %     FiO2: 2 % (11/02/19 2200)  O2 Flow Rate: 3 L/min (11/03/19 1000)    FiO2: 2 %  O2 Bleed-in: 3 lpm  NIV Interface : Nasal Prongs  Nasal Barrier Applied : Comment Comment: not needed, its nasal prongs  NIV Barrier Skin Assesement : clean, dry, intact  Mental Status: alert, appropriate in conversation.  Mood is depressed, not engaged.  Pulm: Non labored breathing. Only able to pull >576ml on IS. Denies pain with deep inspiration or expiration.  Mildly ttp  over the left sub-acromial chest wall    IS Volume: less than 527ml  Cough Quality: weak    Intake/Output last 3 shifts:  I/O last 3 completed shifts:  06/22 0700 - 06/23 0659  In: 1160 (9.4 mL/kg) [P.O.:1160]  Out: 1320 (10.7 mL/kg) [Urine:1320 (0.4 mL/kg/hr)]  Net: -160  Weight: 122.9 kg   Intake/Output this shift:  I/O this shift:  06/23 0700 - 06/23 1459  In: 560 (4.6 mL/kg) [P.O.:560]  Out: - (0 mL/kg)   Net: 560  Weight: 122.9 kg     Allergies:   Allergies   Allergen Reactions    Morphine Hives     Has tolerated Percocet       PRN Medications: (*Numbers appearing after the prn medications listed below indicate doses used since the patient was last seen by this service.)   oxyCODONE  5 mg Oral Q4H PRN    Or    oxyCODONE  10 mg Oral Q4H PRN    ondansetron  4 mg Intravenous Q6H PRN    sodium chloride  10 mL Intravenous PRN    And    sodium chloride  20 mL Intravenous PRN    albuterol  2.5 mg Nebulization Q6H PRN    sodium chloride  0-500 mL/hr Intravenous PRN    dextrose  0-500 mL/hr Intravenous PRN       IV Medications:       Scheduled Medications:    ibuprofen  600 mg Oral Q6H    gabapentin  100 mg Oral TID    tamsulosin  0.4 mg Oral QPM    budesonide  0.5 mg  Nebulization 2 times per day    atorvastatin  40 mg Oral Daily    enoxaparin  30 mg Subcutaneous 2 times per day    lidocaine  1 patch Transdermal Daily    bisacodyl  10 mg Rectal Daily    polyethylene glycol  17 g Oral 2 times per day    acetaminophen  1,000 mg Oral Q8H    bacitracin zinc-polymyxin B sulfate   Topical 2 times per day    pantoprazole  20 mg Oral QAM    sertraline  100 mg Oral Daily    senna  2 tablet Oral 2 times per day    ipratropium-albuterol  3 mL Nebulization Q6H       Lab Results:   All labs in the last 24 hours:   Recent Results (from the past 24 hour(s))   CBC and differential    Collection Time: 11/03/19 12:23 AM   Result Value Ref Range    WBC 12.8 (H) 4.0 - 10.0 THOU/uL    RBC 3.8 (L) 3.90 - 5.20 MIL/uL    Hemoglobin 10.9 (L) 11.2 - 15.7 g/dL    Hematocrit 34 34.00 - 45.00 %    MCV 91 79.0 - 95.0 fL    MCH 29 26 - 32 pg    MCHC 32 32 - 36 g/dL    RDW 13.7 11.7 - 14.4 %    Platelets 360 160 - 370 THOU/uL    Seg Neut % 70.0 %    Lymphocyte % 14.5 %    Monocyte % 8.6 %    Eosinophil % 2.6 %    Basophil % 1.0 %    Neut # K/uL 9.0 (H) 1.6 - 6.1 THOU/uL    Lymph # K/uL 1.9 1.2 - 3.7 THOU/uL    Mono # K/uL 1.1 (H) 0.2 - 0.9 THOU/uL  Eos # K/uL 0.3 0.0 - 0.4 THOU/uL    Baso # K/uL 0.1 0.0 - 0.1 THOU/uL    Nucl RBC % 0.0 0.0 - 0.2 /100 WBC    Nucl RBC # K/uL 0.0 0.0 - 0.0 THOU/uL    IMM Granulocytes # 0.4 (H) 0.0 - 0.0 THOU/uL    IMM Granulocytes 3.3 %   Basic metabolic panel    Collection Time: 11/03/19 12:25 AM   Result Value Ref Range    Glucose 105 (H) 60 - 99 mg/dL    Sodium 136 133 - 145 mmol/L    Potassium 4.6 3.3 - 5.1 mmol/L    Chloride 98 96 - 108 mmol/L    CO2 29 (H) 20 - 28 mmol/L    Anion Gap 9 7 - 16    UN 18 6 - 20 mg/dL    Creatinine 0.55 0.51 - 0.95 mg/dL    GFR,Caucasian 93 *    GFR,Black 108 *    Calcium 9.4 8.6 - 10.2 mg/dL   Magnesium    Collection Time: 11/03/19 12:25 AM   Result Value Ref Range    Magnesium 2.0 1.6 - 2.5 mg/dL   Phosphorus    Collection Time:  11/03/19 12:25 AM   Result Value Ref Range    Phosphorus 4.1 2.7 - 4.5 mg/dL         Lab results: 11/03/19  0023   WBC 12.8*   Hemoglobin 10.9*   Hematocrit 34   RBC 3.8*   Platelets 360           Lab results: 10/25/19  2048   Protime 11.6   INR 1.0       Assessment/Plan: 10F s/p fall w/ odontoid fracture and left posterior 2, 5, 6 rib fractures for which we placed a left sided T5 erector spinae catheter 6/15 that was removed on 6/17 to facilitate an MRI. The catheter seemed to offer little benefit for pain control, so it was not replaced. We were re-consulted to help manage her pain while she has become increasingly drowsy.    She is currently awake, but drowsy, and complains of 8/10 pain. Over the last day, she has received 100mg  gabapentin BID (home dose is 300mg  TID) and oxycodone 2.5mg  three times. While her performance with IS is concerning, it does not appear to be inhibited by pain.    She seems quite depressed and this has been addressed prior to admission. We appreciate Psychiatry's consult and recommendations while she is admitted. Her depression could be a factor in her perception of pain. The chaplain has seen her and perhaps they could offer her additional support.    Recommendations:  - Pt appears depressed, tearful. Admits to feeling frustrated and hopeless. Is not engaged in her own recovery. Seems to have given up. Discuss having psychiatry come see her. She may need both medication and talk therapy. Like many she has had a difficult time with the pandemic, lost her "support person", her husband in Jan, and now has suffered a significant multi-trauma. Pain control is not likely to be fully optimized without optimizing her mental health.  - continue acetaminophen  - continued scheduled NSAIDs if primary team is amenable  - change to gabapentin 100mg  QHS only  - continue oxycodone 2.5mg /5mg  q4-6h prn  - bowel regimen while taking opioids  - IS, aggressive pulmonary toilet  - please contact APS with  worsening decline if respiratory function or any additional questions or concerns.  Shelda Altes, MD  Anesthesia Resident, CA-3  12:42 PM 11/03/2019

## 2019-11-03 NOTE — Progress Notes (Signed)
In-patient Visit  [facility]  [phone]    Patient name: [name]     There were no vitals filed for this visit.    Pt [place] J collar fit was assessed by clinician. Found to fit well.

## 2019-11-03 NOTE — Progress Notes (Signed)
Trauma & Acute Care Surgery Progress Note     LOS: 9 days     Subjective:    Burning neck pain - NSGY eval, no intervention  Straight cath o/n d/t urinary retention  Foley with adequate UOP  BM x 3  Tolerating regular diet  Pulling <500 on IS    Objective:    Vitals Sign Ranges for Past 24 Hours:    BP: (121-174)/(59-135)   Temp:  [35.6 C (96.1 F)-36.4 C (97.5 F)]   Temp src: Temporal (06/22 2300)  Heart Rate:  [74-86]   Resp:  [13-24]   SpO2:  [93 %-98 %]     Physical Exam:    General Appearance: NAD  HEENT: C-Collar in place  Cardiac: RRR  Respiratory: nonlabored  Abdomen: obese, mildly distended, soft, nt  Extremities: WWP    Labs:    CBC:  Recent Labs   Lab 11/03/19  0023 11/02/19  0121 11/02/19  0121 11/01/19  1606 11/01/19  0039 10/30/19  2159 10/30/19  2159 10/30/19  0025 10/30/19  0025 10/28/19  2314 10/28/19  2314   WBC 12.8*  --  13.4*  --  11.1*  --  11.1*  --  15.8*  --  17.8*   Hemoglobin 10.9*   < > 10.8*   < > 10.1*   < > 9.9*   < > 10.9*   < > 11.4   Hematocrit 34  --  34  --  33*  --  33*  --  36  --  37   Platelets 360  --  326  --  287  --  231  --  257  --  231    < > = values in this interval not displayed.       Metabolic Panel:  Recent Labs   Lab 11/03/19  0025 11/02/19  0121 11/01/19  0039 10/30/19  2159 10/30/19  0025 10/28/19  2314   Sodium 136 140 139 139 137 137   Potassium 4.6 4.7 5.1 4.4 4.3 4.9   Chloride 98 100 99 99 97 99   CO2 29* 31* 30* 28 32* 32*   UN 18 17 16  21* 27* 17   Creatinine 0.55 0.54 0.54 0.60 0.82 0.73   Glucose 105* 101* 130* 179* 128* 127*   Calcium 9.4 9.3 9.3 8.9 9.0 8.8   Magnesium 2.0 2.0 1.8 2.2 2.5 2.4   Phosphorus 4.1 4.2 4.0 3.9 3.4 3.5      Assessment:    74 y.o. female L2T fall from 15 stairs 6/15    Type III odontoid fx  Scalp lac   Periaortic fat stranding   ?lumbar vein extrav  Splenic lac,   ?Pancreatic injury,   L 2, 5-6 rib fx   Small extrapleural hematoma      Plan:    - regular diet  - APS consult today  - CPAP qhs  - Holding home plavix. Rest  of home meds as able  - Foley in place. Continue flomax  - Bowel regimen  - pulm toilet, IS (pulling <500 IS)  - SCDs, Lovenox  - PT/OT recs: acute rehab. PMR consult pending  - appreciate NSG recs. [place] J collar for 4-6 weeks; F/U w/ Dr. Marianne Sofia in 4-6 weeks  - Dispo: SICU pending adequate pain control and improved IS    [place], DDS  Surgery Resident

## 2019-11-03 NOTE — Progress Notes (Signed)
Report Given To  Chelsea, RN            Descriptive Sentence / Reason for Admission   [name] is a 74 y.o. female w/pmhx CAD s/p CABG 2012 in [place] and recent stenting angio 2015 on plavix, COPD, OSA, DM who presented as level II Trauma after fall down 12 stairs at nephew's house during family party.     Injuries:     - right scalp hematoma  - left parietal scalp laceration  - type 3 odontoid fx  - left rib fracture  - splenic laceration    Vascular surgery consulted for assessment and management.         Active Issues / Relevant Events   615N: Admit to [facility]. Hypotensive 60/30s with AMS; BP then 80/40s after 2 x 1 L LR fluid boluses given. Given 1 unit PRBC and 1 unit platelets. EKG with trops sent.  R fem triple lumen CVC placed and L fem a-line placed. STAT CTA head/chest/abdomen completed. COVID NEG.  6/15D: Echo done 1015 Erector Spinae Block placed by APS 1550  6/16N: Hypertensive SBPs 170-180s. Given prn 0.5 dilaudid x 1 and 5 mg oxy with no therapeutic effect. Provider notified; pt given 1 time dose 0.25 dilaudid, 10 mg oxy and bupivicaine bolus on PCEA. EKG completed.   6/17: APS Block d/c for MRI. MRI C-Spine completed.   6/18: MIVF. Foley. Desatted x4 high 70s- chest xray and CPAP. 10mg  oxy given- pt lethargic.  6/19D: Flomax started.   6/20-21: VS WDL on 2L NC A&O x3. Drowsy throughout most of shift, sleeping but awakens easily and answers questions appropriately, Complaint of neck pain PRN Ibuprofen x1, complaint of nausea, PRN zofran x2, Refused CPAP overnight, Adequate UO  6/21 1443-1540: Admit to 12-3598. Emesis x1. Phenergan x1. SBP 160's-Hydralazine x1 with minimal effect.   6/22N: Zofran given x1 for nausea. Refused CPAP overnight. .25 Dilaudid & PRN Motrin given for Neck and Back pain.   6/22D: Seen by OT. Zofran for nausea x1. Anesthesia consult > new pain med regime. Foley removed, Purewick placed- DTV 2200.  6/23N: Urine retention >600cc w/ bladder scan. Straight cath x1 per  order. PRN oxy x1 for back/neck pain w/ positive effect. Poor sleep. Purewick removed per patient request. Pt tolerated CPAP 3hrs      To Do List  Q4h VS  Q1h I&O  Q4h Neuro checks  Q8 Bladder scan - Due 1400  Regular Diet- Calorie counts started 6/18  Daily labs  OOB daily  CPAP HS    BTICU APP [id number]  BTICU Resident [id number]  [place] [phone]      Anticipatory Guidance / Discharge Planning  PIV x2        Daughter [name] took Gold ring and opal earrings home 6/15

## 2019-11-03 NOTE — Progress Notes (Signed)
Report Given To  Chelsea, RN            Descriptive Sentence / Reason for Admission   [name] is a 74 y.o. female w/pmhx CAD s/p CABG 2012 in [place] and recent stenting angio 2015 on plavix, COPD, OSA, DM who presented as level II Trauma after fall down 12 stairs at nephew's house during family party.     Injuries:     - right scalp hematoma  - left parietal scalp laceration  - type 3 odontoid fx  - left rib fracture  - splenic laceration    Vascular surgery consulted for assessment and management.         Active Issues / Relevant Events   615N: Admit to [facility]. Hypotensive 60/30s with AMS; BP then 80/40s after 2 x 1 L LR fluid boluses given. Given 1 unit PRBC and 1 unit platelets. EKG with trops sent.  R fem triple lumen CVC placed and L fem a-line placed. STAT CTA head/chest/abdomen completed. COVID NEG.  6/15D: Echo done 1015 Erector Spinae Block placed by APS 1550  6/16N: Hypertensive SBPs 170-180s. Given prn 0.5 dilaudid x 1 and 5 mg oxy with no therapeutic effect. Provider notified; pt given 1 time dose 0.25 dilaudid, 10 mg oxy and bupivicaine bolus on PCEA. EKG completed.   6/17: APS Block d/c for MRI. MRI C-Spine completed.   6/18: MIVF. Foley. Desatted x4 high 70s- chest xray and CPAP. 10mg  oxy given- pt lethargic.  6/19D: Flomax started.   6/20-21: VS WDL on 2L NC A&O x3. Drowsy throughout most of shift, sleeping but awakens easily and answers questions appropriately, Complaint of neck pain PRN Ibuprofen x1, complaint of nausea, PRN zofran x2, Refused CPAP overnight, Adequate UO  6/21 7322-0254: Admit to 12-3598. Emesis x1. Phenergan x1. SBP 160's-Hydralazine x1 with minimal effect.   6/22N: Zofran given x1 for nausea. Refused CPAP overnight. .25 Dilaudid & PRN Motrin given for Neck and Back pain.   6/22D: Seen by OT. Zofran for nausea x1. Anesthesia consult > new pain med regime. Foley removed, Purewick placed- DTV 2200.  6/23N: Urine retention >600cc w/ bladder scan. Straight cath x1 per  order. PRN oxy x1 for back/neck pain w/ positive effect. Poor sleep. Purewick removed per patient request. Pt tolerated CPAP 3hrs  6/23D: RA and tolerating, straight cath x1, OOB x1, increase PO intake, psych eval       To Do List  Q4h VS  Q1h I&O  Q4h Neuro checks  Q8 Bladder scan - Due 1400  Regular Diet- Calorie counts started 6/18  Daily labs  OOB daily  CPAP HS    BTICU APP [id number]  BTICU Resident [id number]  [place] [phone]      Anticipatory Guidance / Discharge Planning  PIV x2        Daughter [name] took Gold ring and opal earrings home 6/15

## 2019-11-03 NOTE — Progress Notes (Signed)
Physical Therapy Treatment Note    Discharge Recommendations:  [name] is currently functioning below her baseline considering her current diagnosis and current functional level. Anticipate patient will need further therapy services upon discharge. PT Discharge Recommendations: (P) Rehab. [name] is currently requiring Total assist of 2 for hoyer with lift.     Physical therapy is following this patient.      Discharge Recommendations: (P) Rehab    Mobility Recommendations: (P) hover OOB to chair    Referral Recommendations: (P) OT, PM&R Consult         11/03/19 1300   PT Tracking   PT TRACKING PT Assigned   Visit Number   Visit Number [facility]) / Treatment Day ([place]) 2   Visit Details [facility])   Visit Type [facility]) Follow Up-General   Precautions/Observations   Precautions used Yes   Brace Applied Yes   Brace Type Neck brace   LDA Observation Monitors   Vital Signs Response with Therapy Stable   Isolation Precautions None   Was patient wearing a mask? No   PPE worn by Probation officer Gloves;Goggles;Mask   Fall Precautions General falls precautions   Pain Assessment   *Is the patient currently in pain? Yes   Pain (Before,During, After) Therapy Before;During;After   0-10 Scale 9   Pain Location Neck   Pain Orientation Lower   Pain Descriptors Burning   Pain Intervention(s) Refer to nursing for pain management   Additional comments Pain with movement   Cognition   Arousal/Alertness Delayed responses to stimuli   Following Commands Follows simple commands with increased time;Follows simple commands with repetition   Additional Comments Limited eye opening throughout session despite cues. able to interact but reportedly fatigued and with significant pain.    LUE Strength    Shoulder Flex 3+/5    Elbow Flex 3+/5      Elbow Ext 3+/5     Grip Strength 3+/5    RUE Strength    Shoulder Flex 3+/5    Elbow Flex 3+/5      Elbow Ext 3+/5     Grip Strength 3+/5    Strength LLE   HIP FLEXION 3-/5   Knee Flexion 3+/5    Knee Extension 3+/5   Ankle Dorsiflexion 4/5   Strength RLE   HIP FLEXION 3-/5   Knee Flexion 3+/5    Knee Extension 3+/5   Ankle Dorsiflexion 4/5   Sensation   Sensation No apparent deficit   Bed Mobility   Bed mobility Tested   Rolling Maximum assist to right;Maximum assist to left   Supine to Sit 1 person assist;Maximum assist    Sit to Supine 2 person assist;Maximum assist    Additional comments Patient needing max assist for rolling in bed needing assist for rotating trunk and pushing with lower extremities.  Patient needing max assist for supine to sitting edge of bed needing assist bring lower extremities off the edge of bed and significant assist conditioning trunk into upright sitting due to pain.  Patient with posterior lean upon sitting and right lateral lean with difficulty correcting.  Patient needing assist repositioning edge of bed to achieve feet on floor and stability in sitting.  Patient needing two-person max is returned to supine due to pain and poor control with trunk lowering and inability to bring lower extremities up in the bed.   Transfers   Transfers Not tested   Additional comments Unable to at this time due to pain and weakness.  Balance   Sitting - Static Minimum assist;Supported   Sitting - Dynamic Max assist;Supported   Additional Comments Patient only able to maintain sitting edge of bed for approximately 10 minutes due to pain and difficulty repositioning edge of bed needing max assist for this.  Right posterior lean in sitting eating assist to correct.   PT AM-PAC Mobility   Turning over in bed? 1   Sitting down on and standing up from a chair with arms? 1   Moving from lying on back to sitting on the side of the bed? 1   Moving to and from a bed to a chair? 1   Need to walk in hospital room? 1   Climbing 3 - 5 steps with a railing? 1   Total Raw Score 6   Standardized Score - Calculated 23.55   % Functional Impairment - Calculated 100%   Assessment   Brief Assessment Appropriate  for skilled therapy   Problem List Impaired UE strength;Impaired LE strength;Poor safety/judgement;Impaired safety/judgement;Impaired cognition;Impaired endurance;Impaired balance;Pain contributing to impairment;Impaired functional mobility   Patient / Family Goal feel better   Plan/Recommendation   Treatment Interventions Assess functional mobility;Restorative PT   PT Frequency 2-4 x/wk   Mobility Recommendations hover OOB to chair   Referral Recommendations OT;PM&R Consult   Discharge Recommendations Rehab   PT Discharge Equipment Recommended To be determined   Assessment/Recommendations Reviewed With: Patient;Nursing;Midlevel provider   Next PT Visit Advance sitting balance and transfers as able.   Time Calculation   Total Time Therapeutic Activities (minutes) 23   Total Time Gait Training (minutes) 0   Total Time Therapeutic Exercises (minutes) 0   Total Time Neuromuscular Re-education (minutes) 0   Total Time Group Therapy (minutes) 0   PT Timed Codes 23   PT Untimed Codes 0   PT Unbilled Time 0   PT Total Treatment 23   Plan and Onset date   Plan of Care Date 10/29/19   Onset Date 10/25/19   Treatment Start Date 10/29/19         Kathryne Sharper PT, DPT  Pager# [phone]

## 2019-11-04 ENCOUNTER — Ambulatory Visit: Payer: Self-pay | Admitting: Student in an Organized Health Care Education/Training Program

## 2019-11-04 LAB — URINALYSIS REFLEX TO CULTURE
Glucose,UA: NEGATIVE mg/dL
Ketones, UA: NEGATIVE
Nitrite,UA: POSITIVE — AB
Specific Gravity,UA: 1.022 (ref 1.002–1.030)
pH,UA: 8 (ref 5.0–8.0)

## 2019-11-04 LAB — CBC AND DIFFERENTIAL
Baso # K/uL: 0.1 10*3/uL (ref 0.0–0.1)
Basophil %: 0.9 %
Eos # K/uL: 0.2 10*3/uL (ref 0.0–0.4)
Eosinophil %: 1.6 %
Hematocrit: 34 % (ref 34–45)
Hemoglobin: 11 g/dL — ABNORMAL LOW (ref 11.2–15.7)
IMM Granulocytes #: 0.4 10*3/uL — ABNORMAL HIGH (ref 0.0–0.0)
IMM Granulocytes: 3.1 %
Lymph # K/uL: 1.8 10*3/uL (ref 1.2–3.7)
Lymphocyte %: 12.6 %
MCH: 29 pg (ref 26–32)
MCHC: 32 g/dL (ref 32–36)
MCV: 89 fL (ref 79–95)
Mono # K/uL: 1.4 10*3/uL — ABNORMAL HIGH (ref 0.2–0.9)
Monocyte %: 9.5 %
Neut # K/uL: 10.3 10*3/uL — ABNORMAL HIGH (ref 1.6–6.1)
Nucl RBC # K/uL: 0 10*3/uL (ref 0.0–0.0)
Nucl RBC %: 0 /100 WBC (ref 0.0–0.2)
Platelets: 405 10*3/uL — ABNORMAL HIGH (ref 160–370)
RBC: 3.8 MIL/uL — ABNORMAL LOW (ref 3.9–5.2)
RDW: 13.6 % (ref 11.7–14.4)
Seg Neut %: 72.3 %
WBC: 14.3 10*3/uL — ABNORMAL HIGH (ref 4.0–10.0)

## 2019-11-04 LAB — URINE MICROSCOPIC (IQ200): RBC,UA: >50 /hpf — AB (ref 0–2)

## 2019-11-04 MED ORDER — ARTIFICIAL TEARS OP OINT *WRAPPED* *I*
TOPICAL_OINTMENT | Freq: Four times a day (QID) | OPHTHALMIC | Status: DC
Start: 2019-11-04 — End: 2019-11-09
  Filled 2019-11-04 (×2): qty 1

## 2019-11-04 MED ORDER — CEFTRIAXONE SODIUM 1 G IN STERILE WATER 10ML SYRINGE *I*
1000.0000 mg | INTRAVENOUS | Status: AC
Start: 2019-11-04 — End: 2019-11-08
  Administered 2019-11-04 – 2019-11-08 (×5): 1000 mg via INTRAVENOUS
  Filled 2019-11-04 (×5): qty 1000

## 2019-11-04 NOTE — Progress Notes (Signed)
Report Given To  Pernell Dupre, RN        Descriptive Sentence / Reason for Admission   [name] is a 74 y.o. female w/pmhx CAD s/p CABG 2012 in [place] and recent stenting angio 2015 on plavix, COPD, OSA, DM who presented as level II Trauma after fall down 12 stairs at nephew's house during family party.     Injuries:     - right scalp hematoma  - left parietal scalp laceration  - type 3 odontoid fx  - left rib fracture  - splenic laceration    Vascular surgery consulted for assessment and management.         Active Issues / Relevant Events   Assumed care 1900-2300  - A&Ox3, neuro checks WDL, VSS on RA.  - L pupil dilated after duoneb- seen by ophthalmology, ok to continue nebulizer's.  - Full bath and linen change.  - CPAP overnight.   - Blanchable erythema to sacrum- protective mepilex placed and LAL mattress ordered         To Do List  Q4h VS  Q4h Neuro checks  Q2 turns  Regular Diet  Daily labs  OOB daily  CPAP HS    BTICU APP [id number]  [facility] Resident [phone]  [place] [phone]        Anticipatory Guidance / Discharge Planning  PIV x2        Daughter [name] took Gold ring and opal earrings home 6/15       Rico Sheehan, RN

## 2019-11-04 NOTE — Provider Consult (Addendum)
Ophthalmology Consult      Patient name: [name]  DOB: Nov 06, 1945       Age: 74 y.o.  MR#: [id number]    Date: 11/04/2019    Reason for consult: Anisocoria R > L  Consulting team: ICU    Chief Complaint:   Chief Complaint   Patient presents with    Fall     [name] is a 74 y.o. female with history notable for CAD (CABG on plavix), COPD, OSA on CPAP, DM who initially presented after falling down 12 stairs during a family party. Ophthalmology was consulted for anisocoria.    The patient reports eyes have been watery, mildly itchy and dry feeling. No pain, vision at baseline. No new flashes, floaters. Patient believes her pupils have always been the same size prior to this hospitalization. No Hx ocular surgeries. The patient uses a CPAP at home, but hasn't been able to use one here in the hospital.    Ocular Meds:  - None.      Current Facility-Administered Medications:     clopidogrel (PLAVIX) tablet 75 mg, 75 mg, Oral, Daily, Giberson, Kristen D, PA, 75 mg at 11/04/19 0806    ibuprofen (ADVIL,MOTRIN) tablet 600 mg, 600 mg, Oral, Q6H, Mahdi, Elaa, MD, 600 mg at 11/04/19 0807    gabapentin (NEURONTIN) capsule 100 mg, 100 mg, Oral, TID, Mahdi, Elaa, MD, 100 mg at 11/04/19 0806    oxyCODONE (ROXICODONE) IR tablet 2.5 mg, 2.5 mg, Oral, Q6H PRN, 2.5 mg at 11/04/19 0955 **OR** oxyCODONE (ROXICODONE) IR tablet 5 mg, 5 mg, Oral, Q6H PRN, Mahdi, Elaa, MD    haloperidol lactate (HALDOL) injection 1 mg, 1 mg, Intravenous, Q6H PRN, Giberson, Kristen D, PA    tamsulosin (FLOMAX) 24 hr capsule 0.4 mg, 0.4 mg, Oral, QPM, Ila Mcgill, NP, 0.4 mg at 11/03/19 2039    budesonide (PULMICORT) nebulizer solution 0.5 mg, 0.5 mg, Nebulization, 2 times per day, Zenaida Niece, MD, 0.5 mg at 11/04/19 0845    atorvastatin (LIPITOR) tablet 40 mg, 40 mg, Oral, Daily, Ila Mcgill, NP, 40 mg at 11/04/19 0806    enoxaparin (LOVENOX) injection 30 mg, 30 mg, Subcutaneous, 2 times per day, Ila Mcgill, NP, 30 mg at 11/04/19 0808    lidocaine (LIDODERM) 5 % patch 1 patch, 1 patch, Transdermal, Daily, Ila Mcgill, NP, 1 patch at 11/04/19 4332    bisacodyl (DULCOLAX) suppository 10 mg, 10 mg, Rectal, Daily, Rose Fillers, Paige, PA, 10 mg at 11/04/19 0807    polyethylene glycol (GLYCOLAX,MIRALAX) powder 17 g, 17 g, Oral, 2 times per day, Rose Fillers, Paige, PA, 17 g at 11/04/19 0804    acetaminophen (TYLENOL) tablet 1,000 mg, 1,000 mg, Oral, Q8H, Probst, Kelly, PA, 1,000 mg at 11/04/19 0803    ondansetron (ZOFRAN) injection 4 mg, 4 mg, Intravenous, Q6H PRN, Rose Fillers, Paige, PA, 4 mg at 11/04/19 0028    albuterol (PROVENTIL) nebulization 2.5 mg, 2.5 mg, Nebulization, Q6H PRN, Ila Mcgill, NP    bacitracin zinc-polymyxin B sulfate (POLYSPORIN) ointment, , Topical, 2 times per day, Ila Mcgill, NP, Given at 11/04/19 0807    pantoprazole (PROTONIX) EC tablet 20 mg, 20 mg, Oral, QAM, Ila Mcgill, NP, 20 mg at 11/04/19 9518    sertraline (ZOLOFT) tablet 100 mg, 100 mg, Oral, Daily, Ila Mcgill, NP, 100 mg at 11/04/19 8416    senna (SENOKOT) tablet 2 tablet, 2 tablet, Oral, 2 times per day, Ila Mcgill, NP, 2  tablet at 11/04/19 0804    ipratropium-albuterol (DUONEB) 0.5-2.5mg  /19mL nebulization solution 3 mL, 3 mL, Nebulization, Q6H, Mahdi, Elaa, MD, 3 mL at 11/04/19 0845    sodium chloride 0.9 % FLUSH REQUIRED IF PATIENT HAS IV, 0-500 mL/hr, Intravenous, PRN, Mahdi, Elaa, MD    dextrose 5 % FLUSH REQUIRED IF PATIENT HAS IV, 0-500 mL/hr, Intravenous, PRN, Mahdi, Elaa, MD    Allergies: Morphine     Medical History:   Past Medical History:   Diagnosis Date    Allergic rhinitis 09/30/2014    Arthritis     Asthma     CAD (coronary artery disease)     DVT (deep venous thrombosis)     post-op after CABG, on anticoagulation for ~2 years    PE (pulmonary embolism)     post-op after CABG, on anticoagulation for ~2 years    Prediabetes     Tubular adenoma 03/2015    colonoscopy         Surgical History:     Past Surgical History:   Procedure Laterality Date    ACHILLES TENDON SURGERY      APPENDECTOMY      CHOLECYSTECTOMY      CORONARY ANGIOPLASTY WITH STENT PLACEMENT      CORONARY ARTERY BYPASS GRAFT  2013    triple     CYST REMOVAL      both thumbs    PR REPAIR EYE BLOWOUT,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    Procedure: ORBIT ORIF;  Surgeon: Jannett Celestine, DDS;  Location: [facility];  Service: OMFS    TONSILLECTOMY AND ADENOIDECTOMY                Base Eye Exam       Visual Acuity         Right Left    Near cc J3 J3              Tonometry (Tonopen, 11:23 AM)         Right Left    Pressure 10 12              Pupils         Dark Light Shape React APD    Right 6 6 Round Sluggish None    Left 5 4 Round Sluggish None   No constriction after 27min with 0.125% pilocarpine in OD; No constriction after 37min with 1% pilocarpine in OD             Visual Fields         Left Right     Full Full              Extraocular Movement         Right Left     Full, Ortho Full, Ortho              Dilation       Both eyes: 2.5% Phenylephrine, 1.0% Tropicamide @ 11:22 AM   OS only                 Slit Lamp and Fundus Exam       External Exam         Right Left    External Peri-orbital edema and eccymosis extending up the forehead.; No UL ptosis. Mild LL reverse ptosis 2/2 swelling. Mild periorbital ecchymosis and edema; no ptosis              Slit Lamp Exam  Right Left    Lids/Lashes Mild lid edema, ecchymosis R > L Mild lid edema, ecchymosis R > L    Conjunctiva/Sclera Quiet Quiet    Cornea Clear and quiet without obvious defect Clear and quiet without obvious defect    Anterior Chamber Clear & deep Clear & deep    Iris No evidence of sphincter rupture, sectoral palsy No evidence of sphincter rupture, sectoral palsy    Lens 1-2+ NS 1-2+ NS    Vitreous PVD PVD    Portable slit lamp exam              Fundus Exam         Right Left    Disc Normal size, appearance, nerve fiber layer Normal size, appearance, nerve fiber layer    C/D  Ratio 0.3 0.3    Macula Normal Normal    Vessels Normal Normal    Periphery Normal Normal                            Assessment and Plan:    # Iatrogenic Anisocoria, 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  - DDx includes Adie pupil, 3rd nerve palsy, traumatic iris damage  - VAcc J3 OU  - EOMs, CVF, IOP wnl  - Anisocoria with R > L more pronounced in light  - Pupils are both sluggish, R > L; poor near constriction  - No constriction of R pupil ~15 min after instillation of 0.125% pilocarpine  - No constriction of R pupil ~15 min after instillation of 1% pilocarpine  - pSLE: Low tear meniscus, inferior PEE, otherwise unremarkable without evidence of traumatic iris damage, sector iris palsy  - DFE: PVD OU, otherwise unremarkable     Summary:  This is a patient with iatrogenic anisocoria secondary to daily ipratropium-albuterol nebulizer use. The R pupil is larger than the L, more pronounced in light. Both pupils are somewhat sluggish. With this presentation, the differential also includes traumatic iris damage, Adie pupil, and 3rd cranial nerve palsy. The patient has no evidence of pupillary sphincter rupture, anterior synechiae, or other traumatic damage on exam. Adie (tonic) pupil is a parasympathetic denervation of the pupil resulting in poor light but better and tonic near constriction. With an Adie pupil, constriction occurs with 0.125% pilocarpine, which is not the case in this patient. A 3rd nerve palsy typically results in ptosis and ophthalmoplegia with limited extraocular eye movements (resulting in diplopia). Neither of these are present in this patient. Additionally, with a 3rd nerve palsy, the pupil constricts after instillation of 1% pilocarpine, which is also not the case in this patient. Therefore, the most likely diagnosis is  pharmacologic dilation of the pupil. Given the patient's duoneb use containing ipratropium, this is almost certainly the cause. The dilation from the nebulizer is transient and non-harmful (although possible annoying as the vision can be blurred) to the patient. No ophthalmic intervention is required.    Recommendations:  - Iatrogenic Anisocoria 2/2 nebulizer use, no ophthalmologic intervention  - Artificial tears (e.g., refresh plus) QID in both eyes for dryness  - Further management per primary team  - Ophthalmology will sign off at this time.  - Ophthalmology will arrange for outpatient follow-up for dry eye; Please alert  ophthalmology when discharging        Please page ophthalmology with any questions or concerns.  Thank you for allowing [place] to participate in the care of this patient.    Ophthalmology Clinic Phone Number: [phone]     Patient seen by consult resident with documentation forwarded to attending. Attending attestation to follow.    Authored by Janetta Hora on 11/04/2019 at 12:05 PM     Janetta Hora, MD, PhD, 12:05 PM, 11/04/2019  Ophthalmology PGY1  [phone]    I have reviewed the case with the resident and agree with the assessment and plan as noted.  Most likely cause on ddx are pharmacologic.  Given concomitant use of medication known to dilate the pupils, iatrogenic pharmacologic dilation most likely    Phylliss Bob MD

## 2019-11-04 NOTE — Progress Notes (Signed)
Occupational Therapy Treatment        Discharge Recommendations:  Acute Rehab The patient will benefit from & tolerate acute rehab to maximize functional independence & safety prior to discharge., Would appreciate PM&R consult    Equipment Recommendations: TBD in next setting    Hospital Stay Recommendations:  Encourage active participation with ADLs, Toilet transfer status -bedpan  and OOB for meals       Treatment       11/04/19 1345   Visit Details [facility])   Visit Type ([place]) Follow up - General   Tx Prioritization 3 - Moderate   OT Tracking   OT Tracking OT Assigned   OT Last Visit   Visit (#) of Five 2   Precautions   Precautions used Yes   Brace Applied MIAMI J   Fall Precautions General falls precautions   LDA Observation IV lines;Monitors;Foley cath   Patient Wearing Mask No   Writer wearing PPE including Gloves;Goggles;Mask   Vitals Vital signs stable   Pain Assessment   *Is the patient currently in pain? Yes   Additional Comments neck pain 5/10 using 0-10 pain scale, refer to nursing for pain mgmt    Vision    Current Vision Wears corrective lenses   Cognition   Level of Alertness Appropriate responses to stimuli   Orientation A&Ox4   Attention  Intact focal   Following Commands Follows one step commands 100% of the time;Minimal verbal cues required   Additional Comments Pt intermittently closing eyes, however with cues and encougament, able to keep open during majority of OT session.  Pt cooperative to participate.    Bed Mobility   Additional Comments NT received in chair    Functional Transfers   Sit to Stand Maximum Assist;Two person assist   Additional Comments Req dep assist to scoot forward to edge of chair. sit > stand Max A of 2, blocking feet from sliding. Pt able to achieve upright stand and tolerate x 5 seconds prior to requesting to sit 2/2 pain and fatigue. Left in chair, feet elevated with call light in reach, RN aware.    ADL Assessment   Grooming Minimal Assist  (setup wash face and oral  care, Min A hair d/t staples )   Where Grooming Assessed Chair   UE Dressing Moderate Assist  (able thread arms and place over head, assist to pull gown down back )   Where UE [place]  (don socks )   Bathing UB setup in chair  LB Mod A estimate    Toileting Dependent  (foley )   OT Functional Outcome Measures   Functional Outcome Measures Yes   OT AM-PAC Self Care   Putting on and taking off regular lower body clothing? 1   Bathing (including washing, rinsing, drying)? 2   Toileting, which includes using toilet, bedpan, or urinal? 1   Putting on and taking off regular upper body clothing? 2   Taking care of personal grooming such as brushing teeth? 3   Eating Meals 3   Total Raw Score 12   CMS Score - Calculated 66.57%   Multidisciplinary Communication   Multidisciplinary Communication pt, RN    Recommendation   OT Discharge Recommendations   (Rehab)   Additional Comments Pt more alert and participatory this session. Pt limited by ongoing pain, however cooperative and agreeable to transfer training to promote increased indep with ADLs. Will follow.  OCCUPATIONAL THERAPY PROVIDER     Electronically Signed By:   Harrison Mons, OT    Please contact the OT pager [facility]) for all questions/concerns and/or update requests.    Timed Calculations:  Timed Codes:  30 min ADL retraining   Untimed Codes: 0  Unbilled Time: 0  Total Time:  30 min

## 2019-11-04 NOTE — Consults (Signed)
Medical Nutrition Therapy - Brief Note    RD calorie count screen shows pt with calorie counts ordered by provider 10/29/19. Pt not currently being followed by Registered Dietitian. If calorie count results desired, please place a nutrition consult for analysis of needs, intake provisions, and recommendations.    Per flowsheet documentation patient intake appears to be increasing from 25% of trays to 50-75% of trays.     Corky Sing, RD  Pager: [phone]

## 2019-11-04 NOTE — Consults (Signed)
Psychiatric Social Work Note    Subjective/Objective:   Probation officer met with pt this morning to check in.  Pt's sister Blanch Media was present.  Pt was sitting up in the chair, alert and engaged easily with Probation officer.  Pt reported that she is doing okay overall.  She noted that she may be moving to a different unit although is unsure.  Pt reported that she is sleeping okay, denying any nightmares or waking up in panic related to the fall.  She denied any low mood, noting that she is trying to work through everything that has happened as best she can.  She denied any acute anxiety at this time.  Pt reported that she just wants to be able to move forward from this and return to life as it prior to her hospitalization.  She does feel motivated to work hard in regaining strength.  Pt would like for writer to continue to check in.  No acute psychiatric concerns at this time and writer will continue to follow with pt.    Mental Status Exam  Mental Status Exam  Appearance: Groomed, Appropriately dressed  Relationship to Interviewer: Cooperative, Friendly  Psychomotor Activity: Decreased  Abnormal Movements: None  Speech : Regular rate, Normal tone, Normal rhythm, Normal amount  Language: Fluent, Normal comprehension, Normal repetition  Mood: Euthymic  Affect: Appropriate  Thought Process: Logical, Sequential, Goal-directed  Thought Content: No suicidal ideation, No homicidal ideation, No delusions, No obsessions/compulsions  Perceptions/Associations : No hallucinations  Sensorium: Alert, Oriented x3  Cognition: Recent memory intact, Remote memory intact, Good attention span  Fund of Knowledge: Normal  Insight : Good  Judgement: Good    Author: Germaine Pomfret, LMSW  as of: 11/04/2019  at: 1:47 PM

## 2019-11-04 NOTE — Interim Hospital Course Summary (Signed)
Interim Summary for long stay patient    74 yo female w pmhx CAD, aortic stenosis, CABG on plavix, OSA, and DM presented as level 2 trauma after fall down ~15 stairs on 10/25/19. She was found to have a type 3 odontoid fx, grade 1 splenic laceration, scalp laceration, extrapleural hematoma and multiple rib fxs. She was admitted to the [facility] for q1 hour pulse checks and hemodynamic monitoring. Vascular surgery ordered CTA which showed left retroperitoneal hematoma near the psoas muscle. No aortic extravasation. Patient to follow-up in one month with Vascular surgery. Urology consulted for increasing left retroperitoneal hematoma with wall thickening of the left ureter, no intervention required and recommended follow-up renal US in one month. [place] J collar at all times for 4-6 weeks and follow-up with Dr. Marianne Sofia. 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 Ophthalmology consulted, no intervention necessary. Would like patient to follow up outpatient for dry eyes.  Patient transferred to the floor on 11/04/19.     Hospital Problem List:  ACTIVE    Diagnosis Date Noted    *!*Fall from 15 stairs 6/15 type III odontoid fx, scalp lac, ?lumbar vein extrav, periaortic fat stranding, splenic lac, ?pancreatic injury, L 2, 5-6 rib fx, small extrapleural hematoma [S12.120A] 10/25/2019      RESOLVED   No resolved problems to display.       Do NOT erase these  green markers that allow the Discharge Summary to pull in this Hospital Course data.    First Signed: Vivianne Spence, PA  On: 11/04/2019  at: 4:44 PM

## 2019-11-04 NOTE — Progress Notes (Signed)
Trauma & Acute Care Surgery Progress Note     LOS: 10 days     Subjective:    NAEO, VSS and afebrile on RA   Foley replaced for urinary retention  Tolerating regular diet. Denies nausea. BM x 2  Pulling <500 on IS  Continues to report pain although slightly improved. APS following  Reports feelings of hopelessness  Restarted Plavix yesterday    Objective:    Vitals Sign Ranges for Past 24 Hours:    BP: (109-167)/(52-81)   Temp:  [35.5 C (95.9 F)-36.2 C (97.2 F)]   Temp src: Temporal (06/24 0000)  Heart Rate:  [69-95]   Resp:  [13-21]   SpO2:  [91 %-98 %]     Physical Exam:    General Appearance: NAD  HEENT: C-Collar in place, bilateral periorbital ecchymoses, staples in place on scalp  Cardiac: RRR  Respiratory: nonlabored  Abdomen: obese, mildly distended, soft, nt  Extremities: WWP    Labs:    CBC:  Recent Labs   Lab 11/04/19  0024 11/03/19  0023 11/03/19  0023 11/02/19  0121 11/02/19  0121 11/01/19  1606 11/01/19  0039 10/30/19  2159 10/30/19  2159 10/30/19  0025 10/30/19  0025   WBC 14.3*  --  12.8*  --  13.4*  --  11.1*  --  11.1*  --  15.8*   Hemoglobin 11.0*   < > 10.9*   < > 10.8*   < > 10.1*   < > 9.9*   < > 10.9*   Hematocrit 34  --  34  --  34  --  33*  --  33*  --  36   Platelets 405*  --  360  --  326  --  287  --  231  --  257    < > = values in this interval not displayed.       Metabolic Panel:  Recent Labs   Lab 11/03/19  0025 11/02/19  0121 11/01/19  0039 10/30/19  2159 10/30/19  0025 10/28/19  2314   Sodium 136 140 139 139 137 137   Potassium 4.6 4.7 5.1 4.4 4.3 4.9   Chloride 98 100 99 99 97 99   CO2 29* 31* 30* 28 32* 32*   UN 18 17 16  21* 27* 17   Creatinine 0.55 0.54 0.54 0.60 0.82 0.73   Glucose 105* 101* 130* 179* 128* 127*   Calcium 9.4 9.3 9.3 8.9 9.0 8.8   Magnesium 2.0 2.0 1.8 2.2 2.5 2.4   Phosphorus 4.1 4.2 4.0 3.9 3.4 3.5      Assessment:    74 y.o. female L2T fall from 15 stairs 6/15    Type III odontoid fx  Scalp lac   Periaortic fat stranding   ?lumbar vein extrav  Splenic lac,    ?Pancreatic injury,   L 2, 5-6 rib fx   Small extrapleural hematoma      Plan:    - regular diet  - APS following appreciate recs  - Psych consult today  - CPAP qhs  - Home meds as able  - Foley in place. Continue flomax  - Bowel regimen  - pulm toilet, IS (pulling <500 IS)  - SCDs, Lovenox  - PT/OT recs: acute rehab. PMR consult pending  - appreciate NSG recs. [place] J collar for 4-6 weeks; F/U w/ Dr. Marianne Sofia in 4-6 weeks  - Dispo: SICU pending adequate pain control and improved IS  Sherrilyn Rist, MD  Surgery Resident

## 2019-11-04 NOTE — Progress Notes (Signed)
Report Given To  [facility] RN             Descriptive Sentence / Reason for Admission   [name] is a 74 y.o. female w/pmhx CAD s/p CABG 2012 in [place] and recent stenting angio 2015 on plavix, COPD, OSA, DM who presented as level II Trauma after fall down 12 stairs at nephew's house during family party.     Injuries:     - right scalp hematoma  - left parietal scalp laceration  - type 3 odontoid fx  - left rib fracture  - splenic laceration    Vascular surgery consulted for assessment and management.         Active Issues / Relevant Events   615N: Admit to [place]. Hypotensive 60/30s with AMS; BP then 80/40s after 2 x 1 L LR fluid boluses given. Given 1 unit PRBC and 1 unit platelets. EKG with trops sent.  R fem triple lumen CVC placed and L fem a-line placed. STAT CTA head/chest/abdomen completed. COVID NEG.  6/15D: Echo done 1015 Erector Spinae Block placed by APS 1550  6/16N: Hypertensive SBPs 170-180s. Given prn 0.5 dilaudid x 1 and 5 mg oxy with no therapeutic effect. Provider notified; pt given 1 time dose 0.25 dilaudid, 10 mg oxy and bupivicaine bolus on PCEA. EKG completed.   6/17: APS Block d/c for MRI. MRI C-Spine completed.   6/18: MIVF. Foley. Desatted x4 high 70s- chest xray and CPAP. 10mg  oxy given- pt lethargic.  6/19D: Flomax started.   6/20-21: VS WDL on 2L NC A&O x3. Drowsy throughout most of shift, sleeping but awakens easily and answers questions appropriately, Complaint of neck pain PRN Ibuprofen x1, complaint of nausea, PRN zofran x2, Refused CPAP overnight, Adequate UO  6/21 [id number]: Admit to 12-3598. Emesis x1. Phenergan x1. SBP 160's-Hydralazine x1 with minimal effect.   6/22N: Zofran given x1 for nausea. Refused CPAP overnight. .25 Dilaudid & PRN Motrin given for Neck and Back pain.   6/22D: Seen by OT. Zofran for nausea x1. Anesthesia consult > new pain med regime. Foley removed, Purewick placed- DTV 2200.  6/23N: Urine retention >600cc w/ bladder scan. Straight cath x1 per  order. PRN oxy x1 for back/neck pain w/ positive effect. Poor sleep. Purewick removed per patient request. Pt tolerated CPAP 3hrs  6/23D: RA and tolerating, straight cath x1, OOB x1, increase PO intake, psych eval   6/24N: Pupil non-reactive, neuro assessed at bedside. Duo-neb held; pupil resolved. Bladder scan >700cc; foley placed. CPAP tolerated 1.5 hours.       To Do List  Q4h VS  Q1h I&O  Q4h Neuro checks  Regular Diet- Calorie counts started 6/18  Daily labs  OOB daily  CPAP HS    BTICU APP [id number]  BTICU Resident [id number]  [place] [phone]      Anticipatory Guidance / Discharge Planning  PIV x2        Daughter [name] took Gold ring and opal earrings home 6/15

## 2019-11-04 NOTE — Progress Notes (Addendum)
BTICU Progress Note     LOS: 10 days     Interval History:     Ongoing anisocoria, alerted NSY who re-evaluated. They recommend optho consult today.  Foley replaced for urinary retention    Objective Section:  Temp:  [35.5 C (95.9 F)-36.2 C (97.2 F)] 36.1 C (97 F)  Heart Rate:  [69-95] 76  Resp:  [13-21] 14  BP: (109-167)/(52-81) 123/59  FiO2:  [21 %] 21 %    Intake and Output:     Intake/Output Summary (Last 24 hours) at 11/04/2019 0636  Last data filed at 11/04/2019 0459  Gross per 24 hour   Intake 2400 ml   Output 2220 ml   Net 180 ml       Nursing pain score: Last Nursing documented pain:  0-10 Scale: 0 (11/04/19 0500)    Physical Exam by Systems:  General: NAD resting comfortably in bed w/ C-collar and CPAP  Heart: RRR   Lungs: CTAB   Abdomen:  Soft, ND/NT, +BS   Ext: Edema Bilaterally, WWP bilaterally   Neuro: AAOX3, w/o deficits       Lab Results:   Heme:  Recent Labs   Lab 11/04/19  0024 11/03/19  0023 11/03/19  0023 11/02/19  0121 11/02/19  0121   WBC 14.3*  --  12.8*  --  13.4*   Hemoglobin 11.0*   < > 10.9*   < > 10.8*   Hematocrit 34  --  34  --  34   Platelets 405*  --  360  --  326    < > = values in this interval not displayed.   Chem:  Recent Labs   Lab 11/03/19  0025 11/02/19  0121 11/02/19  0121 11/01/19  0039 11/01/19  0039   Sodium 136  --  140  --  139   Potassium 4.6   < > 4.7   < > 5.1   Chloride 98  --  100  --  99   CO2 29*  --  31*  --  30*   UN 18  --  17  --  16   Creatinine 0.55  --  0.54  --  0.54   Glucose 105*  --  101*  --  130*   Calcium 9.4  --  9.3  --  9.3   Magnesium 2.0  --  2.0  --  1.8   Phosphorus 4.1  --  4.2  --  4.0    < > = values in this interval not displayed.   No results for input(s): PGLU in the last 168 hours.LFT's:  No results for input(s): ALB, PALB, TP, TB, DB, AST, ALT, ALK, AMY, LIP in the last 168 hours.ABG:  Recent Labs   Lab 11/01/19  1606   pH 7.47*   pCO2, Arterial 45   pO2,Arterial 64*   Base Excess, Arterial 8*   CO 1.7     Routine Medications:     clopidogrel  75 mg Oral Daily    ibuprofen  600 mg Oral Q6H    gabapentin  100 mg Oral TID    tamsulosin  0.4 mg Oral QPM    budesonide  0.5 mg Nebulization 2 times per day    atorvastatin  40 mg Oral Daily    enoxaparin  30 mg Subcutaneous 2 times per day    lidocaine  1 patch Transdermal Daily    bisacodyl  10 mg Rectal Daily  polyethylene glycol  17 g Oral 2 times per day    acetaminophen  1,000 mg Oral Q8H    bacitracin zinc-polymyxin B sulfate   Topical 2 times per day    pantoprazole  20 mg Oral QAM    sertraline  100 mg Oral Daily    senna  2 tablet Oral 2 times per day    ipratropium-albuterol  3 mL Nebulization Q6H         PRN Medications:   oxyCODONE  2.5 mg Oral Q6H PRN    Or    oxyCODONE  5 mg Oral Q6H PRN    haloperidol lactate  1 mg Intravenous Q6H PRN    ondansetron  4 mg Intravenous Q6H PRN    albuterol  2.5 mg Nebulization Q6H PRN    sodium chloride  0-500 mL/hr Intravenous PRN    dextrose  0-500 mL/hr Intravenous PRN       Assessment: [name] is a 74 y.o. female L2T fall from 15 stairson6/15    Type III odontoid fx  Scalp lac   Periaortic fat stranding   ?lumbar vein extrav  Splenic lac,   ?Pancreatic injury,   L 2, 5-6 rib fx   Small extrapleural hematoma    Currently Active/Followed Hospital Problems:  Active Hospital Problems    Diagnosis     *!*Fall from 15 stairs 6/15 type III odontoid fx, scalp lac, ?lumbar vein extrav, periaortic fat stranding, splenic lac, ?pancreatic injury, L 2, 5-6 rib fx, small extrapleural hematoma      PLAN:    Pulm: L 2, 5-6  rib fractures; Hx OSA (on CPAP), COPD   -NC 2L wean as tolerated  -Chest PT, IS, Flutter valve  -Home albuterol, pulmicort, duonebs  -Home CPAP qhs;  nasal mask if available      CVS: HTN  -Lipitor 40  -Hold home plavix      BP  Min: 109/57  Max: 167/81  Pulse  Avg: 84.6  Min: 69  Max: 95       Renal/E/F: Urinary Retention  -Flomax  Foley replaced     BMP MWF        Intake/Output Summary (Last 24  hours) at 11/04/2019 0636  Last data filed at 11/04/2019 0459  Gross per 24 hour   Intake 2400 ml   Output 2220 ml   Net 180 ml       Recent Labs   Lab 11/03/19  0025 11/02/19  0121   UN 18 17   Creatinine 0.55 0.54   Potassium 4.6 4.7       GI/Nutrition: No active issues   Diet regular  Dietary nutrition supplements adult:  Dietary nutrition supplements adult:      -Try magic cups and ensure  -Bowel regimen  -Continue Home PPI  -BM +  -Zofran prn      ID: afebrile, increasing leukocytosis       -CTM fever/leukocytosis curve  -Covid negative 6/14      Temp (24hrs), Avg:35.9 C (96.7 F), Min:35.5 C (95.9 F), Max:36.2 C (97.2 F)      Recent Labs   Lab 11/04/19  0024 11/03/19  0023   WBC 14.3* 12.8*       Heme: anemia   -No active issues  -H/H stable  -CBC Daily  -lovenox    Recent Labs   Lab 11/04/19  0024 11/03/19  0023 11/03/19  0023   Hematocrit 34   < > 34  Platelets 405*  --  360    < > = values in this interval not displayed.         Endo: No active issue   - CTM glucose      Neuro/pain: acute pain; chest wall analgesia/ depression    -tylenol ATC   -Motrin ATC (alternate w/ tylenol)  -Lidocaine patches  -APS consult - recc gabapentin 123m TID; oxycodone 2.5/562mq4-6h prn; bowel regimen while on opioids  -Continue home sertraline  -Hold gabapentin 2/2 lethargy        Integument: Routine wound and ICU care.    Musculoskeletal: type III odontoid fx   - NSGY: c-collar 4-6 wks/ AAT  -PT/OT: acute rehab  - PMR: awaiting reccs  - Psych- for depression- no acute psych concerns, no adjustment  To meds needed      Prophylaxis: lovenox    Author: BREarlie CountsPA  as of: 11/04/2019  at: 6:36 AM     Burn / Trauma ICU Fellow Plan:    I have seen and evaluated the patient personally on rounds with the resident/attending/PA/NP ICU team. I personally examined the patient.  All nursing documentation, laboratory data, test results, and radiographs were reviewed and interpreted by me. I agree with the database,  findings, and plan of care recorded in the above note. I have reviewed and edited the above note to reflect my findings as well.  I have established the management plan for this patient's critical illness with the ICU team and have been immediately available to assist with patient care. Additional details are highlighted are within my note below.     Respiratory: acute pulmonary insufficiency, rib fx, OSA, COPD  - on RA (goal SpO2 >88%)  - IS, chest PT  - home albuterol, pulmicort, duonebs  - CPAP qhs   Acute Pulmonary Insufficiency -- Yes   Acute Respiratory Failure: No  Ventilator dependent: No    CV: HTN, HLD  - lipitor 40  - continue home plavix   Shock: No Dysrrythmia/Arrhythmia: No    GU/Fluids: urinary retention  - failed TOV, foley re-inserted  - flomax  - BMP MWF  Acute renal injury: No Chronic renal failure: No Electrolyte abnormality: No Need to keep foley? yes retention    GI/Nutrition: no acute issues   - regular diet, magic cup  - bowel reg  - home PPI  - zofran prn  On GI prophylaxis: Yes, Moderate malnutrition: No     ID: afebrile leukocytosis  - send UA  SIRS: Yes Sepsis: No Severe Sepsis: No  Pneumonia: No  COVID positive: No  Last COVID test date: 06/14    Heme: no acute issues  - lovenox ppx  - CBC daily   On DVT Prophylaxis: Yes Acute blood loss anemia: No Coagulopathy: No     Endocrine: no acute issues   Hyperglycemia -- No  Insulin regimen -- No  Synthroid -- No      Neuro/Sedation: pain, depression  - tylenol ATC, motrin ATC  - lidocaine patches  - gabapentin 100 TID  - oxy prn  - home sertraline  - C collar 4-6 weeks   - ophthalmology consult for anisocoria  Encephalopathy: Yes Coma: NoBrain compression: No    Exts/Consults/Other: APS, psych, PT/OT, PMR     Social/Family/Dispo:   - transfer to SDU  Family present at bedside today?  No    Signed by: DaBonney RousselMD  as of 11/04/2019 at 8:45 AM  Burn / Trauma ICU Attending Assessment and  Plan:    I have and evaluated the patient  on rounds with the resident/Fellow/PA ICU team.  All nursing documentation, laboratory data, test results, and radiographs were reviewed and interpreted by me.I agree with the database, findings, and plan of care recorded in the above note. I have reviewed and edited the above note to reflect my findings as well.  I have established the management plan for this patient's critical illness with the ICU team and have been immediately available to assist with patient care.     Attending Attestation:    This is a high complexity patient.     [   ] This patient is critically ill with at least 1 organ system failure associated with a high probability of life threatening deterioration. The care I have delivered involved high complexity decision making to assess, manipulate, and support vital system function(s), to treat the vital organ system failure and/or prevent further deterioration of the patient's condition. All nursing documentation, laboratory data, test results, and radiographs were reviewed and interpreted by me during multidisciplinary rounds. I have established the management plan for this patient's critical illness and have been immediately available to assist with patient care. My documented total critical care time reflects my own patient care time and does not include teaching or procedure time.     Critical care time since midnight/this 24 hour period: Personal time spent -    minutes.  This critical care time is exclusive of any time for separately billable procedures and time spent teaching      Signed by: Dorathy Kinsman, MD as of 11/04/2019 at 1:00 PM

## 2019-11-04 NOTE — Progress Notes (Signed)
SW placed call to patient's daughter, [name], to discuss discharge planning. Writer reviewed [facility] vs SNF and that [facility] is monitoring patient's hospital course. Writer reviewed that if patient is unable to tolerate the 6 half hour sessions on [facility], patient may need less intense rehab at [facility]. Writer asked Joy if she would be able to come in to meet with Probation officer and patient to discuss the discharge options. Joy stated she can come in tomorrow, Friday, at 10 am to meet with Probation officer and patient.     SW has prepared SNF packet and will meet with patient and daughter tomorrow to complete as back up in event patient does not qualify for [facility].     Reinaldo Meeker LMSW  [phone]  [place]

## 2019-11-04 NOTE — Progress Notes (Signed)
Report Given To  Latoya Sheehan, RN          Descriptive Sentence / Reason for Admission   [name] is a 74 y.o. female w/pmhx CAD s/p CABG 2012 in [place] and recent stenting angio 2015 on plavix, COPD, OSA, DM who presented as level II Trauma after fall down 12 stairs at nephew's house during family party.     Injuries:     - right scalp hematoma  - left parietal scalp laceration  - type 3 odontoid fx  - left rib fracture  - splenic laceration    Vascular surgery consulted for assessment and management.         Active Issues / Relevant Events   Assumed care 1600-1900  -A&Ox3, neuro checks WDL, VSS on RA  -oriented to unit and call bell  -L pupil dilated after duoneb- seen by ophthalmology, ok to continue nebulizer's  -Turned Q2  -blanchable erythema to sacrum- protective mepilex placed and LAL mattress ordered       To Do List  Q4h VS  Q4h Neuro checks  Q2 turns  Regular Diet  Daily labs  OOB daily  CPAP HS  BTICU APP [id number]  [facility] Resident [phone]  [place] [phone]      Anticipatory Guidance / Discharge Planning  PIV x2        Daughter [name] took Gold ring and opal earrings home 6/15

## 2019-11-05 LAB — CBC AND DIFFERENTIAL
Baso # K/uL: 0.1 10*3/uL (ref 0.0–0.1)
Basophil %: 0.8 %
Eos # K/uL: 0.3 10*3/uL (ref 0.0–0.4)
Eosinophil %: 1.9 %
Hematocrit: 35 % (ref 34–45)
Hemoglobin: 11 g/dL — ABNORMAL LOW (ref 11.2–15.7)
IMM Granulocytes #: 0.6 10*3/uL — ABNORMAL HIGH (ref 0.0–0.0)
IMM Granulocytes: 4.1 %
Lymph # K/uL: 2 10*3/uL (ref 1.2–3.7)
Lymphocyte %: 14.8 %
MCH: 29 pg (ref 26–32)
MCHC: 32 g/dL (ref 32–36)
MCV: 91 fL (ref 79–95)
Mono # K/uL: 1.3 10*3/uL — ABNORMAL HIGH (ref 0.2–0.9)
Monocyte %: 9.4 %
Neut # K/uL: 9.4 10*3/uL — ABNORMAL HIGH (ref 1.6–6.1)
Nucl RBC # K/uL: 0 10*3/uL (ref 0.0–0.0)
Nucl RBC %: 0 /100 WBC (ref 0.0–0.2)
Platelets: 413 10*3/uL — ABNORMAL HIGH (ref 160–370)
RBC: 3.8 MIL/uL — ABNORMAL LOW (ref 3.9–5.2)
RDW: 13.9 % (ref 11.7–14.4)
Seg Neut %: 69 %
WBC: 13.6 10*3/uL — ABNORMAL HIGH (ref 4.0–10.0)

## 2019-11-05 LAB — BASIC METABOLIC PANEL
Anion Gap: 14 (ref 7–16)
CO2: 26 mmol/L (ref 20–28)
Calcium: 9.4 mg/dL (ref 8.6–10.2)
Chloride: 100 mmol/L (ref 96–108)
Creatinine: 0.56 mg/dL (ref 0.51–0.95)
GFR,Black: 107 *
GFR,Caucasian: 93 *
Glucose: 104 mg/dL — ABNORMAL HIGH (ref 60–99)
Lab: 17 mg/dL (ref 6–20)
Potassium: 3.9 mmol/L (ref 3.3–5.1)
Sodium: 140 mmol/L (ref 133–145)

## 2019-11-05 LAB — PHOSPHORUS: Phosphorus: 3.8 mg/dL (ref 2.7–4.5)

## 2019-11-05 LAB — MAGNESIUM: Magnesium: 2.1 mg/dL (ref 1.6–2.5)

## 2019-11-05 LAB — CLOSTRIDIUM DIFFICILE EIA: C difficile Toxins A and B EIA: 0

## 2019-11-05 MED ORDER — POLYETHYLENE GLYCOL 3350 PO PACK 17 GM *I*
17.0000 g | PACK | Freq: Every day | ORAL | Status: DC
Start: 2019-11-05 — End: 2019-11-05

## 2019-11-05 MED ORDER — MELATONIN 3 MG PO TABS *I*
3.0000 mg | ORAL_TABLET | Freq: Every day | ORAL | Status: DC
Start: 2019-11-05 — End: 2019-11-09
  Administered 2019-11-05 – 2019-11-08 (×4): 3 mg via ORAL
  Filled 2019-11-05 (×4): qty 1

## 2019-11-05 NOTE — Progress Notes (Signed)
Report Given To  Dorina Hoyer, RN       Descriptive Sentence / Reason for Admission   [name] is a 74 y.o. female w/pmhx CAD s/p CABG 2012 in [place] and recent stenting angio 2015 on plavix, COPD, OSA, DM who presented as level II Trauma after fall down 12 stairs at nephew's house during family party.     Injuries:     - right scalp hematoma  - left parietal scalp laceration  - type 3 odontoid fx  - left rib fracture  - splenic laceration    Vascular surgery consulted for assessment and management.         Active Issues / Relevant Events   Assumed care 0700-1900  - A&Ox3, neuro checks WDL, VSS on RA  - Multiple loose stools- c.diff negative   - OOB to chair with 2 person assist stand pivot, tolerated well           To Do List  Q4h VS  Q4h Neuro checks  Q2 turns  Regular Diet  Daily labs  OOB daily  CPAP HS    BTICU APP [id number]  [facility] Resident [phone]  [place] [phone]        Anticipatory Guidance / Discharge Planning  PIV x2        Daughter [name] took Gold ring and opal earrings home 6/15

## 2019-11-05 NOTE — Progress Notes (Addendum)
Lovada Barwick Grieger arrived on [phone] from 328 at 2147.  Patient mobilization upon admission: q2 turns.    Vital signs stable.  Pain currently 0/10.     Oriented to call bell, room, unit, visiting hours, IPC, and incentive spirometer.   Admission Packet given N/A and reviewed with  patient.    COVID swab sent: Yes    Patient arrived to unit with (Belongings) glasses, smart phone, Games developer, books, home CPAP    Current Lines/Drains/Devices/Braces/Collars: c-collar, piv, foley    Four eyed skin assessment completed with Dana Allan, RN. Assessment findings include:   Protective allevyn on sacrum  Abrasion above L ear with staples  R scalp hematoma  Bruising on head    Interventions: Yes.   WOCN consulted: no.   Q2 Hour Turning/Repositioning Needed:Yes .   Low Air Loss Ordered: Yes. Waffle Cushion ordered Yes .   Heels elevated and floating Yes. Prevalon boot No .   Patient incontinent of urine: Yes; patient incontinent of stool: Yes.      Wound: Yes. abrasion No dressing applied Dressed with: Other: OTA    Dressing Change order present: No    Current activity listed as change activity as tolerated; patient acknowledged understanding of current treatment plan.  Demetria Pore Viana is currently resting with call bell in reach.    Discharge process explained.     Wyvonnia Lora, RN as of 11/05/2019 at 10:13 PM

## 2019-11-05 NOTE — Progress Notes (Addendum)
Report Given To  RN on 54      Descriptive Sentence / Reason for Admission   [name] is a 74 y.o. female w/pmhx CAD s/p CABG 2012 in [place] and recent stenting angio 2015 on plavix, COPD, OSA, DM who presented as level II Trauma after fall down 12 stairs at nephew's house during family party.     Injuries:     - right scalp hematoma  - left parietal scalp laceration  - type 3 odontoid fx  - left rib fracture  - splenic laceration    Vascular surgery consulted for assessment and management.         Active Issues / Relevant Events   Assumed care 1900-2150:  - A&Ox3, neuro checks WDL, VS WDL on RA  - refused bath  - T&R Q2          To Do List  Q4h VS  Q4h Neuro checks  Q2 turns  Regular Diet  Daily labs  OOB daily  CPAP HS    BTICU APP [id number]  [facility] Resident [phone]  [place] [phone]        Anticipatory Guidance / Discharge Planning  PIV x2        Daughter [name] took Gold ring and opal earrings home 6/15

## 2019-11-05 NOTE — Progress Notes (Signed)
Trauma & Acute Care Surgery Progress Note     LOS: 11 days     Subjective:    NAEO, VSS  Foley replaced  UA positive for UTI - on ceftriaxone  Tolerating regular diet. Denies nausea.   Pulling close to 1 L on IS  Continues to report pain although slightly improved. APS following  Reports feelings of hopelessness  Restarted Plavix      Objective:    Vitals Sign Ranges for Past 24 Hours:    BP: (101-144)/(55-73)   Temp:  [36.1 C (97 F)-36.5 C (97.7 F)]   Temp src: Temporal (06/25 0357)  Heart Rate:  [76-94]   Resp:  [13-20]   SpO2:  [88 %-99 %]     Physical Exam:    General Appearance: NAD  HEENT: C-Collar in place, bilateral periorbital ecchymoses, staples in place on scalp  Cardiac: RRR  Respiratory: nonlabored  Abdomen: obese, mildly distended, soft, nt  Extremities: WWP    Labs:    CBC:  Recent Labs   Lab 11/04/19  2358 11/04/19  0024 11/04/19  0024 11/03/19  0023 11/03/19  0023 11/02/19  0121 11/02/19  0121 11/01/19  1606 11/01/19  0039 10/30/19  2159 10/30/19  2159   WBC 13.6*  --  14.3*  --  12.8*  --  13.4*  --  11.1*  --  11.1*   Hemoglobin 11.0*   < > 11.0*   < > 10.9*   < > 10.8*   < > 10.1*   < > 9.9*   Hematocrit 35  --  34  --  34  --  34  --  33*  --  33*   Platelets 413*  --  405*  --  360  --  326  --  287  --  231    < > = values in this interval not displayed.     Metabolic Panel:  Recent Labs   Lab 11/04/19  2358 11/03/19  0025 11/02/19  0121 11/01/19  0039 10/30/19  2159 10/30/19  0025   Sodium 140 136 140 139 139 137   Potassium 3.9 4.6 4.7 5.1 4.4 4.3   Chloride 100 98 100 99 99 97   CO2 26 29* 31* 30* 28 32*   UN 17 18 17 16  21* 27*   Creatinine 0.56 0.55 0.54 0.54 0.60 0.82   Glucose 104* 105* 101* 130* 179* 128*   Calcium 9.4 9.4 9.3 9.3 8.9 9.0   Magnesium 2.1 2.0 2.0 1.8 2.2 2.5   Phosphorus 3.8 4.1 4.2 4.0 3.9 3.4      Assessment:    74 y.o. female L2T fall from 15 stairs 6/15    Type III odontoid fx  Scalp lac   Periaortic fat stranding   ?lumbar vein extrav  Splenic lac,    ?Pancreatic injury,   L 2, 5-6 rib fx   Small extrapleural hematoma    Plan:    - regular diet  - APS following appreciate recs  - Psych consult today  - CPAP qhs  - Home meds as able  - Foley in place. Continue flomax  - Bowel regimen  - pulm toilet  - SCDs, Lovenox  - PT/OT recs: acute rehab. PMR consult pending  - appreciate NSG recs. [place] J collar for 4-6 weeks; F/U w/ Dr. Marianne Sofia in 4-6 weeks  - Disposition - step down status    [place], DDS  Surgery Resident

## 2019-11-05 NOTE — Progress Notes (Addendum)
SW met with patient and Joy at bedside to discuss discharge options. Writer reviewed 512 acute rehab vs. SNF rehab. Writer reviewed the different approaches to rehab for both options. Writer reviewed SNF paperwork and insurance coverage. Patient amenable to either option. Writer left SNF packet with [name] and asked her to research facilities and SW will follow up with her after the weekend. Remainder of packet to [place].    SW will continue to monitor patient's hospital course and assist with discharge planning as patient progresses medically.     Reinaldo Meeker LMSW  [phone]  [place]

## 2019-11-05 NOTE — Consults (Signed)
Psychiatric Social Work Note    Subjective/Objective:   Probation officer met with pt this morning to check in, pt's daughter was also present.  Pt reported that she is doing okay today, noting that yesterday and last night were difficult for her.  Pt stated that she was going to the bathroom often which was distressing.  Pt reported that she will be moving to another unit later today.      Pt reported that her mood has been okay, she does admit to times where she feels hopeless.  During those times, pt does report that her pain is usually worse.  Writer validated those feelings and encouraged her to focus on positives throughout the day, such as various family members coming to visit.  Pt agreed with this.  Pt does report that she continues to be motivated to get into rehab.  She denied any worse mood or anxiety.  She would like to keep her Zoloft dose where it is at.  She is aware that if she does have instances of increased anxiety, there is medication that can assist.  Pt denied any nightmares related to the fall or waking up with panic attacks.  She did endorse the goal to get better sleep, noting that she was supposed to get Melatonin but has not gotten it.  No acute psychiatric concerns, writer will continue to follow and provide support.    Pt's daughter denied any acute psychiatric concerns at this time.  She did ask appropriate questions in regards to rehab.  SW [place] in to see pt.    Mental Status Exam  Mental Status Exam  Appearance: Groomed, Appropriately dressed  Relationship to Interviewer: Cooperative, Eye contact good, Friendly  Psychomotor Activity: Decreased  Abnormal Movements: None  Speech : Regular rate, Normal tone, Normal rhythm, Normal amount  Language: Fluent, Normal comprehension, Normal repetition  Mood: Dysphoric  Affect: Appropriate  Thought Process: Logical, Sequential, Goal-directed  Thought Content: No suicidal ideation, No homicidal ideation, No delusions, No  obsessions/compulsions  Perceptions/Associations : No hallucinations  Sensorium: Alert, Oriented x3  Cognition: Recent memory intact, Remote memory intact, Good attention span  Fund of Knowledge: Normal  Insight : Good  Judgement: Good       Author: Germaine Pomfret, LMSW  as of: 11/05/2019  at: 10:32 AM

## 2019-11-05 NOTE — Plan of Care (Signed)
Problem: Safety  Goal: Patient will remain free of falls  Outcome: Progressing towards goal  Goal: Prevent any intentional injury  Outcome: Progressing towards goal     Problem: Pain/Comfort  Goal: Patient's pain or discomfort is manageable  Outcome: Progressing towards goal     Problem: Nutrition  Goal: Nutritional status is maintained or improved - Geriatric  Outcome: Progressing towards goal     Problem: Mobility  Goal: Functional status is maintained or improved - Geriatric  Outcome: Progressing towards goal     Problem: Psychosocial  Goal: Demonstrates ability to cope with illness  Outcome: Progressing towards goal     Problem: Cognitive function  Goal: Cognitive function will be maintained or return to baseline  Outcome: Progressing towards goal     Problem: Potential Alteration in Skin Integrity - Pressure Ulcer  Goal: The patient will maintain intact skin free of pressure ulcer  Outcome: Progressing towards goal

## 2019-11-05 NOTE — Progress Notes (Signed)
Report Given To  Ardeth Perfect, RN :)      Descriptive Sentence / Reason for Admission   [name] is a 74 y.o. female w/pmhx CAD s/p CABG 2012 in [place] and recent stenting angio 2015 on plavix, COPD, OSA, DM who presented as level II Trauma after fall down 12 stairs at nephew's house during family party.     Injuries:     - right scalp hematoma  - left parietal scalp laceration  - type 3 odontoid fx  - left rib fracture  - splenic laceration    Vascular surgery consulted for assessment and management.         Active Issues / Relevant Events   Assumed care 2300-0700  - A&Ox3, neuro checks WDL, VSS on RA.  - Multiple loose stools ovn, pt unable to hold  - Full linen change completed   - labs drawn          To Do List  Q4h VS  Q4h Neuro checks  Q2 turns  Regular Diet  Daily labs  OOB daily  CPAP HS    BTICU APP [id number]  [facility] Resident [phone]  [place] [phone]        Anticipatory Guidance / Discharge Planning  PIV x2        Daughter [name] took Gold ring and opal earrings home 6/15       Rayburn Ma, RN

## 2019-11-06 LAB — CBC AND DIFFERENTIAL
Baso # K/uL: 0.1 10*3/uL (ref 0.0–0.1)
Baso # K/uL: 0.1 10*3/uL (ref 0.0–0.1)
Basophil %: 0.6 %
Basophil %: 0.7 %
Eos # K/uL: 0.2 10*3/uL (ref 0.0–0.4)
Eos # K/uL: 0.2 10*3/uL (ref 0.0–0.4)
Eosinophil %: 1.7 %
Eosinophil %: 1.5 %
Hematocrit: 34 % (ref 34–45)
Hematocrit: 34 % (ref 34–45)
Hemoglobin: 10.6 g/dL — ABNORMAL LOW (ref 11.2–15.7)
Hemoglobin: 10.6 g/dL — ABNORMAL LOW (ref 11.2–15.7)
IMM Granulocytes #: 0.4 10*3/uL — ABNORMAL HIGH (ref 0.0–0.0)
IMM Granulocytes #: 0.3 10*3/uL — ABNORMAL HIGH (ref 0.0–0.0)
IMM Granulocytes: 3.7 %
IMM Granulocytes: 2.5 %
Lymph # K/uL: 2 10*3/uL (ref 1.2–3.7)
Lymph # K/uL: 1.8 10*3/uL (ref 1.2–3.7)
Lymphocyte %: 16.7 %
Lymphocyte %: 16.4 %
MCH: 29 pg (ref 26–32)
MCH: 29 pg (ref 26–32)
MCHC: 32 g/dL (ref 32–36)
MCHC: 32 g/dL (ref 32–36)
MCV: 92 fL (ref 79–95)
MCV: 92 fL (ref 79–95)
Mono # K/uL: 1.4 10*3/uL — ABNORMAL HIGH (ref 0.2–0.9)
Mono # K/uL: 1.1 10*3/uL — ABNORMAL HIGH (ref 0.2–0.9)
Monocyte %: 11.4 %
Monocyte %: 10 %
Neut # K/uL: 7.8 10*3/uL — ABNORMAL HIGH (ref 1.6–6.1)
Neut # K/uL: 7.5 10*3/uL — ABNORMAL HIGH (ref 1.6–6.1)
Nucl RBC # K/uL: 0 10*3/uL (ref 0.0–0.0)
Nucl RBC # K/uL: 0 10*3/uL (ref 0.0–0.0)
Nucl RBC %: 0 /100 WBC (ref 0.0–0.2)
Nucl RBC %: 0.2 /100 WBC (ref 0.0–0.2)
Platelets: 393 10*3/uL — ABNORMAL HIGH (ref 160–370)
Platelets: 410 10*3/uL — ABNORMAL HIGH (ref 160–370)
RBC: 3.7 MIL/uL — ABNORMAL LOW (ref 3.9–5.2)
RBC: 3.7 MIL/uL — ABNORMAL LOW (ref 3.9–5.2)
RDW: 13.8 % (ref 11.7–14.4)
RDW: 13.9 % (ref 11.7–14.4)
Seg Neut %: 65.9 %
Seg Neut %: 68.9 %
WBC: 11.8 10*3/uL — ABNORMAL HIGH (ref 4.0–10.0)
WBC: 10.8 10*3/uL — ABNORMAL HIGH (ref 4.0–10.0)

## 2019-11-06 LAB — AEROBIC CULTURE

## 2019-11-06 NOTE — Progress Notes (Signed)
Trauma & Acute Care Surgery Progress Note     LOS: 12 days     Subjective:    NAEO, VSS  Foley in place  Endorses pain that is well controlled  Denies F/C/N/V/CP/SOB     Objective:    Vitals Sign Ranges for Past 24 Hours:    BP: (90-145)/(54-93)   Temp:  [36 C (96.8 F)-36.3 C (97.3 F)]   Temp src: Temporal (06/25 2000)  Heart Rate:  [74-83]   Resp:  [15-18]   SpO2:  [91 %-98 %]     Physical Exam:    General Appearance: NAD  HEENT: C-Collar in place, bilateral periorbital ecchymoses, staples in place on scalp  Cardiac: RRR  Respiratory: nonlabored  GU: foley in place, draining urine  Abdomen: obese, mildly distended, soft, nt  Extremities: WWP    Labs:    CBC:  Recent Labs   Lab 11/04/19  2358 11/04/19  0024 11/04/19  0024 11/03/19  0023 11/03/19  0023 11/02/19  0121 11/02/19  0121 11/01/19  1606 11/01/19  0039 10/30/19  2159 10/30/19  2159   WBC 13.6*  --  14.3*  --  12.8*  --  13.4*  --  11.1*  --  11.1*   Hemoglobin 11.0*   < > 11.0*   < > 10.9*   < > 10.8*   < > 10.1*   < > 9.9*   Hematocrit 35  --  34  --  34  --  34  --  33*  --  33*   Platelets 413*  --  405*  --  360  --  326  --  287  --  231    < > = values in this interval not displayed.     Metabolic Panel:  Recent Labs   Lab 11/04/19  2358 11/03/19  0025 11/02/19  0121 11/01/19  0039 10/30/19  2159   Sodium 140 136 140 139 139   Potassium 3.9 4.6 4.7 5.1 4.4   Chloride 100 98 100 99 99   CO2 26 29* 31* 30* 28   UN 17 18 17 16  21*   Creatinine 0.56 0.55 0.54 0.54 0.60   Glucose 104* 105* 101* 130* 179*   Calcium 9.4 9.4 9.3 9.3 8.9   Magnesium 2.1 2.0 2.0 1.8 2.2   Phosphorus 3.8 4.1 4.2 4.0 3.9      Assessment:    74 y.o. female L2T fall from 15 stairs 6/15    Type III odontoid fx  Scalp lac   Periaortic fat stranding   ?lumbar vein extrav  Splenic lac,   ?Pancreatic injury,   L 2, 5-6 rib fx   Small extrapleural hematoma    Plan:    - regular diet  - APS following appreciate recs  - Psych consult today  - CPAP qhs  - Home meds as able  - Foley in  place. Continue flomax  - Bowel regimen  - pulm toilet  - SCDs, Lovenox  - PT/OT recs: acute rehab. PMR consult pending  - appreciate NSG recs. [place] J collar for 4-6 weeks; F/U w/ Dr. Marianne Sofia in 4-6 weeks  - Disposition - SNF (packets sent) vs 512    Blythe Stanford, MD  Surgery Resident

## 2019-11-06 NOTE — Plan of Care (Signed)
Problem: Safety  Goal: Patient will remain free of falls  Outcome: Maintaining  Goal: Prevent any intentional injury  Outcome: Maintaining     Problem: Pain/Comfort  Goal: Patient's pain or discomfort is manageable  Outcome: Maintaining     Problem: Nutrition  Goal: Nutritional status is maintained or improved - Geriatric  Outcome: Maintaining     Problem: Mobility  Goal: Functional status is maintained or improved - Geriatric  Outcome: Maintaining     Problem: Psychosocial  Goal: Demonstrates ability to cope with illness  Outcome: Maintaining     Problem: Cognitive function  Goal: Cognitive function will be maintained or return to baseline  Outcome: Maintaining     Problem: Potential Alteration in Skin Integrity - Pressure Ulcer  Goal: The patient will maintain intact skin free of pressure ulcer  Outcome: Maintaining

## 2019-11-07 MED ORDER — SIMETHICONE 80 MG PO CHEW *I*
80.0000 mg | CHEWABLE_TABLET | Freq: Once | ORAL | Status: AC
Start: 2019-11-07 — End: 2019-11-07
  Administered 2019-11-07: 80 mg via ORAL
  Filled 2019-11-07: qty 1

## 2019-11-07 MED ORDER — SIMETHICONE 80 MG PO CHEW *I*
80.0000 mg | CHEWABLE_TABLET | Freq: Once | ORAL | Status: DC
Start: 2019-11-07 — End: 2019-11-07

## 2019-11-07 NOTE — Progress Notes (Signed)
Trauma & Acute Care Surgery Progress Note     LOS: 13 days     Subjective:    NAEO, VSS  Tolerating diet of mostly liquids. +BMs.  Foley in place  No pain at time of exam.  Denies F/C/N/V/CP/SOB     Objective:    Vitals Sign Ranges for Past 24 Hours:    BP: (134-163)/(66-83)   Temp:  [35.8 C (96.4 F)-36.7 C (98.1 F)]   Temp src: Temporal (06/27 0218)  Heart Rate:  [70-85]   Resp:  [16-18]   SpO2:  [93 %-94 %]     Physical Exam:    General Appearance: NAD  HEENT: C-Collar in place  Respiratory: nonlabored breathing  GU: foley in place, draining urine  Abdomen: obese, mildly distended, soft, nontender  Extremities: WWP    Labs:    CBC:  Recent Labs   Lab 11/06/19  2313 11/06/19  0344 11/06/19  0344 11/04/19  2358 11/04/19  2358 11/04/19  0024 11/04/19  0024 11/03/19  0023 11/03/19  0023 11/02/19  0121 11/02/19  0121   WBC 10.8*  --  11.8*  --  13.6*  --  14.3*  --  12.8*  --  13.4*   Hemoglobin 10.6*   < > 10.6*   < > 11.0*   < > 11.0*   < > 10.9*   < > 10.8*   Hematocrit 34  --  34  --  35  --  34  --  34  --  34   Platelets 410*  --  393*  --  413*  --  405*  --  360  --  326    < > = values in this interval not displayed.     Metabolic Panel:  Recent Labs   Lab 11/04/19  2358 11/03/19  0025 11/02/19  0121 11/01/19  0039   Sodium 140 136 140 139   Potassium 3.9 4.6 4.7 5.1   Chloride 100 98 100 99   CO2 26 29* 31* 30*   UN 17 18 17 16    Creatinine 0.56 0.55 0.54 0.54   Glucose 104* 105* 101* 130*   Calcium 9.4 9.4 9.3 9.3   Magnesium 2.1 2.0 2.0 1.8   Phosphorus 3.8 4.1 4.2 4.0      Assessment:    74 y.o. female L2T fall from 15 stairs 6/15    Type III odontoid fx  Scalp lac   Periaortic fat stranding   ?lumbar vein extrav  Splenic lac,   ?Pancreatic injury,   L 2, 5-6 rib fx   Small extrapleural hematoma    Plan:    - regular diet  - APS following appreciate recs  - Psych consult today  - CPAP qhs  - Home meds as able  - Foley in place. Continue flomax  - Bowel regimen  - pulm toilet  - SCDs, Lovenox  - PT/OT  recs: acute rehab. PMR consult pending  - appreciate NSG recs. [place] J collar for 4-6 weeks; F/U w/ Dr. Marianne Sofia in 4-6 weeks  - Disposition - SNF (packets sent) vs [place], MD  Surgery Resident

## 2019-11-07 NOTE — Plan of Care (Signed)
Problem: Safety  Goal: Patient will remain free of falls  Outcome: Maintaining  Goal: Prevent any intentional injury  Outcome: Maintaining     Problem: Pain/Comfort  Goal: Patient's pain or discomfort is manageable  Outcome: Maintaining     Problem: Nutrition  Goal: Nutritional status is maintained or improved - Geriatric  Outcome: Maintaining     Problem: Mobility  Goal: Functional status is maintained or improved - Geriatric  Outcome: Maintaining     Problem: Psychosocial  Goal: Demonstrates ability to cope with illness  Outcome: Maintaining     Problem: Cognitive function  Goal: Cognitive function will be maintained or return to baseline  Outcome: Maintaining     Problem: Elimination  Goal: Patient is able to empty bladder or return to baseline  Outcome: Progressing towards goal  Pt has foley catheter in for urinary retention

## 2019-11-07 NOTE — Plan of Care (Signed)
Problem: Safety  Goal: Patient will remain free of falls  Outcome: Maintaining  Goal: Prevent any intentional injury  Outcome: Maintaining     Problem: Pain/Comfort  Goal: Patient's pain or discomfort is manageable  Outcome: Progressing towards goal     Problem: Nutrition  Goal: Nutritional status is maintained or improved - Geriatric  Outcome: Maintaining     Problem: Mobility  Goal: Functional status is maintained or improved - Geriatric  Outcome: Progressing towards goal     Problem: Psychosocial  Goal: Demonstrates ability to cope with illness  Outcome: Maintaining     Problem: Cognitive function  Goal: Cognitive function will be maintained or return to baseline  Outcome: Maintaining     Problem: Potential Alteration in Skin Integrity - Pressure Ulcer  Goal: The patient will maintain intact skin free of pressure ulcer  Outcome: Maintaining     Problem: Elimination  Goal: Patient is able to empty bladder or return to baseline  Outcome: Maintaining

## 2019-11-08 LAB — BASIC METABOLIC PANEL
Anion Gap: 10 (ref 7–16)
CO2: 26 mmol/L (ref 20–28)
Calcium: 8.9 mg/dL (ref 8.6–10.2)
Chloride: 99 mmol/L (ref 96–108)
Creatinine: 0.52 mg/dL (ref 0.51–0.95)
GFR,Black: 110 *
GFR,Caucasian: 95 *
Glucose: 125 mg/dL — ABNORMAL HIGH (ref 60–99)
Lab: 13 mg/dL (ref 6–20)
Potassium: 3.4 mmol/L (ref 3.3–5.1)
Sodium: 135 mmol/L (ref 133–145)

## 2019-11-08 LAB — CBC AND DIFFERENTIAL
Baso # K/uL: 0.1 10*3/uL (ref 0.0–0.1)
Baso # K/uL: 0.1 10*3/uL (ref 0.0–0.1)
Basophil %: 0.7 %
Basophil %: 0.8 %
Eos # K/uL: 0.2 10*3/uL (ref 0.0–0.4)
Eos # K/uL: 0.2 10*3/uL (ref 0.0–0.4)
Eosinophil %: 1.7 %
Eosinophil %: 1.7 %
Hematocrit: 32 % — ABNORMAL LOW (ref 34–45)
Hematocrit: 35 % (ref 34–45)
Hemoglobin: 10.3 g/dL — ABNORMAL LOW (ref 11.2–15.7)
Hemoglobin: 10.8 g/dL — ABNORMAL LOW (ref 11.2–15.7)
IMM Granulocytes #: 0.2 10*3/uL — ABNORMAL HIGH (ref 0.0–0.0)
IMM Granulocytes #: 0.3 10*3/uL — ABNORMAL HIGH (ref 0.0–0.0)
IMM Granulocytes: 1.5 %
IMM Granulocytes: 2 %
Lymph # K/uL: 1.9 10*3/uL (ref 1.2–3.7)
Lymph # K/uL: 2.1 10*3/uL (ref 1.2–3.7)
Lymphocyte %: 14.7 %
Lymphocyte %: 15.7 %
MCH: 29 pg (ref 26–32)
MCH: 29 pg (ref 26–32)
MCHC: 31 g/dL — ABNORMAL LOW (ref 32–36)
MCHC: 32 g/dL (ref 32–36)
MCV: 91 fL (ref 79–95)
MCV: 92 fL (ref 79–95)
Mono # K/uL: 1.1 10*3/uL — ABNORMAL HIGH (ref 0.2–0.9)
Mono # K/uL: 1.2 10*3/uL — ABNORMAL HIGH (ref 0.2–0.9)
Monocyte %: 8.3 %
Monocyte %: 9 %
Neut # K/uL: 9.5 10*3/uL — ABNORMAL HIGH (ref 1.6–6.1)
Neut # K/uL: 9.6 10*3/uL — ABNORMAL HIGH (ref 1.6–6.1)
Nucl RBC # K/uL: 0 10*3/uL (ref 0.0–0.0)
Nucl RBC # K/uL: 0 10*3/uL (ref 0.0–0.0)
Nucl RBC %: 0 /100 WBC (ref 0.0–0.2)
Nucl RBC %: 0 /100 WBC (ref 0.0–0.2)
Platelets: 429 10*3/uL — ABNORMAL HIGH (ref 160–370)
Platelets: 438 10*3/uL — ABNORMAL HIGH (ref 160–370)
RBC: 3.6 MIL/uL — ABNORMAL LOW (ref 3.9–5.2)
RBC: 3.8 MIL/uL — ABNORMAL LOW (ref 3.9–5.2)
RDW: 13.9 % (ref 11.7–14.4)
RDW: 14.4 % (ref 11.7–14.4)
Seg Neut %: 71.4 %
Seg Neut %: 72.5 %
WBC: 13.2 10*3/uL — ABNORMAL HIGH (ref 4.0–10.0)
WBC: 13.4 10*3/uL — ABNORMAL HIGH (ref 4.0–10.0)

## 2019-11-08 LAB — PHOSPHORUS: Phosphorus: 3.6 mg/dL (ref 2.7–4.5)

## 2019-11-08 LAB — MAGNESIUM: Magnesium: 2 mg/dL (ref 1.6–2.5)

## 2019-11-08 MED ORDER — SENNOSIDES 8.6 MG PO TABS *I*
2.0000 | ORAL_TABLET | Freq: Every day | ORAL | Status: DC
Start: 2019-11-08 — End: 2019-11-09
  Administered 2019-11-08 – 2019-11-09 (×2): 2 via ORAL
  Filled 2019-11-08 (×2): qty 2

## 2019-11-08 MED ORDER — POLYETHYLENE GLYCOL 3350 PO PACK 17 GM *I*
17.0000 g | PACK | Freq: Every day | ORAL | Status: DC
Start: 2019-11-08 — End: 2019-11-09
  Administered 2019-11-08: 17 g via ORAL
  Filled 2019-11-08: qty 17

## 2019-11-08 NOTE — Progress Notes (Signed)
11/08/19 1116   [facility] Patient Class Review   Patient Class Review Inpatient   Patient class effective 10/25/2019     Sondra Come, RN BSN  [facility] Utilization Management   Pager [phone]

## 2019-11-08 NOTE — Progress Notes (Signed)
Trauma & Acute Care Surgery  Progress Note                                                  LOS: 14 days        Attending: Rosey Bath         Brief Description     [name] is a 74 y.o. female admitted secondary to fall from stairs.     Interval Events / Subjective     NAE. VSS. Pain controlled. Tolerating regular diet. OOB with [place] 6/27, voiding spontaneously. Denies f/c, cp, sob, n/v, abdominal pain.     Objective     Vital Signs:  Temp:  [35.6 C (96.1 F)-36.2 C (97.2 F)] 36.2 C (97.2 F)  Heart Rate:  [71-79] 79  Resp:  [16-18] 17  BP: (128-147)/(61-71) 133/67    I/Os:  I/O last 3 completed shifts:  06/27 0700 - 06/28 0659  In: 925 (7.5 mL/kg) [P.O.:925]  Out: 1200 (9.8 mL/kg) [Urine:1200 (0.4 mL/kg/hr)]  Net: -275  Weight: 122.9 kg     GEN: resting comfortably, NAD  HEENT: NC/AT  CV: regular rate   PULM: unlabored respirations  ABD: soft/nontender/nondistended  EXT: WWP, b/l radial, DP/PT pulses palpable  NEURO: alert, motor/sensation grossly intact, no focal deficits       Labs  BMP/Electrolytes CBC   Recent Labs     11/07/19  2346   Sodium 135   Potassium 3.4   Chloride 99   CO2 26   UN 13   Creatinine 0.52   Glucose 125*   Calcium 8.9   Magnesium 2.0   Phosphorus 3.6    Recent Labs     11/07/19  2346 11/06/19  2313 11/06/19  0344   WBC 13.2* 10.8* 11.8*   Hemoglobin 10.3* 10.6* 10.6*   Hematocrit 32* 34 34   Platelets 429* 410* 393*   MCV 91 92 92   RDW 13.9 13.9 13.8   Seg Neut % 72.5 68.9 65.9   Lymphocyte % 14.7 16.4 16.7   Monocyte % 8.3 10.0 11.4   Eosinophil % 1.7 1.5 1.7   Basophil % 0.8 0.7 0.6      Liver Function Cardiac Studies   No results for input(s): AST, ALT, HTBIL, TB, DB in the last 72 hours.    No components found with this basename: ALKPHOS No results for input(s): CKTS, TROPU, TROP, MCKMB, CKMB in the last 168 hours.    No components found with this basename: RICKMBS    Coagulation Glucose       Lab results: 10/25/19  2048   Protime 11.6   INR 1.0              Lab results: 10/25/19  2048   aPTT 22.4*          Lab results: 11/07/19  2346 11/04/19  2358 11/03/19  0025 11/02/19  0121 11/01/19  0039   Glucose 125* 104* 105* 101* 130*       Lab Results   Component Value Date    HA1C 5.9 (H) 08/11/2019        Imaging  No results found.       Assessment / Plan    [name] is a 74 y.o. female with h/o CAD, DVT, PE, allergic rhinitis. Her injuries were  managed non operatively.     Injuries include:   Right periorbital hematoma   Right supraorbital skin abrasion   Right frontal scalp hematoma   L parietal scalp laceration    Consulting services & recommendations:  - Neurosurgery- c-collar 4-6 weeks, f/u with Dr. Marianne Sofia  - Ophthalmology- no intervention, anisocoria 2/2 nebulizer use, artificial tears QID   -Plastic Surgery-would recommend against removing staples, f/u Dr. Waldron Session 7 days.  -Anesthesiology- signed off     GI: regular diet , antiemetics PRN zofran   Renal / Fluids: No indication for IVF, replete lytes PRN. Foley in place 6/24, flomax.   CV: VSS, hemodynamically stable   Respiratory: Incentive spirometer, room air    ID: Monitor CBC, Ceftriaxone 1g x7 days   Analgesia: Oxycodone 5/10mg , tylenol ATC    Activity: as tolerated, PT/OT-at bedside.    DVT ppx: IPCs, lovenox   Dispo: SNF plan       Author: Candiss Norse. Trinetta Alemu, NP  on: 11/08/2019  at: 8:45 AM

## 2019-11-08 NOTE — Progress Notes (Signed)
Writer aware of patient's transfer from 328 to 514.   Hand off given to unit social worker [employer], LMSW.   Placement packet tubed to unit at station #[id number].     Stevphen Meuse, [place]  Pager ID: [phone]  Cell: [phone]

## 2019-11-08 NOTE — Plan of Care (Signed)
Problem: Safety  Goal: Patient will remain free of falls  Outcome: Maintaining  Goal: Prevent any intentional injury  Outcome: Maintaining     Problem: Pain/Comfort  Goal: Patient's pain or discomfort is manageable  Outcome: Maintaining     Problem: Nutrition  Goal: Nutritional status is maintained or improved - Geriatric  Outcome: Maintaining     Problem: Mobility  Goal: Functional status is maintained or improved - Geriatric  Outcome: Progressing towards goal     Problem: Psychosocial  Goal: Demonstrates ability to cope with illness  Outcome: Maintaining     Problem: Cognitive function  Goal: Cognitive function will be maintained or return to baseline  Outcome: Maintaining     Problem: Potential Alteration in Skin Integrity - Pressure Ulcer  Goal: The patient will maintain intact skin free of pressure ulcer  Outcome: Maintaining     Problem: Elimination  Goal: Patient is able to empty bladder or return to baseline  Outcome: Progressing towards goal  Pt has a foley cath which was placed for urinary retention

## 2019-11-08 NOTE — Plan of Care (Signed)
Problem: Safety  Goal: Patient will remain free of falls  Outcome: Maintaining  Goal: Prevent any intentional injury  Outcome: Maintaining     Problem: Pain/Comfort  Goal: Patient's pain or discomfort is manageable  Outcome: Maintaining     Problem: Nutrition  Goal: Nutritional status is maintained or improved - Geriatric  Outcome: Maintaining     Problem: Mobility  Goal: Functional status is maintained or improved - Geriatric  Outcome: Maintaining     Problem: Psychosocial  Goal: Demonstrates ability to cope with illness  Outcome: Maintaining     Problem: Cognitive function  Goal: Cognitive function will be maintained or return to baseline  Outcome: Maintaining     Problem: Potential Alteration in Skin Integrity - Pressure Ulcer  Goal: The patient will maintain intact skin free of pressure ulcer  Outcome: Maintaining     Problem: Elimination  Goal: Patient is able to empty bladder or return to baseline  Outcome: Maintaining

## 2019-11-08 NOTE — Progress Notes (Signed)
In-patient Visit  [facility]  [phone]    Patient name: [name]     There were no vitals filed for this visit.    The Patient was provided with the following items:  Device  Side: N/A  Size:: Short  Model:: [place] J  Manufacturer: [employer]  Part Number: [id number]  Warranty:: 90 Days  Quantity: 1  Status: Delivered    Functional Goals: Spine precautions    ROM settings: N/A    Expected Frequency / Duration of Use:   Daily    Modifications made: No modifications were required at this time    Complexity:   Fitting was routine, requiring little to no adjustments    Yes, Device was inspected for safety/security and found to be functioning properly    Ordered/Delivered: Device Delivered    The patient and caregiver given verbal and written instructions.  The following instructions were reviewed: Purpose of device  Cleaning / Care of device  Potential Risks / Benefits  Frequency / Duration of use  How to report potential failure / malfunctions    Reviewed details of this delivery with [name] 's nurse.      Patient Instructions for [facility] Cervical Collar    Purpose of device   You have been provided with a [place] J cervical collar (c-collar) that your physician has prescribed for wear.  The purpose of your c-collar is to provide cervical immobilization following injury or fracture.      Wear Instructions   Your Cervical Collar is a 2 part brace (front and back).    The back of the collar fully laps over the front of the brace.     C-collar is appropriate for use directly against the skin.     Place the front of the collar under your chin and attach the Velcro strap from one side of the back of the brace to the side of the front portion of the brace.   Close the brace snug around the neck with the opposite Velcro strap.     Make adjustments to your straps so that your collar is centered on your neck.   It is important to wear your collar as snug as tolerable.   Wearing c-collar too loose with reduce effectiveness.     If blue pads become soiled they can be hand washed in mild detergent and air dried.   Replacement pads are available if pads become excessively soil or ruined.  Contact your Orthotist if you need to obtain a set of replacement pads.    Rigid frame of collar can be hand-wash the brace with mild soap and water.   Velcro straps can be cleaned with a bristle brush.     Cleaning and Care   Keeping your [place] J and the skin beneath clean is an important part of your treatment.    Daily cleaning will help to prevent skin irritation.    When you are at home you should shower or bathe with the collar on.    Change pads while lying down: Lie flat in bed without a pillow. Keep your head in a neutral position.    Peel the soiled, blue pads off. Look carefully at the shape as you remove them so that you can reposition the clean pads properly. The pads attach with velcro.    Wash the pads with mild facial soap and water. DO NOT use bleach or harsh detergents.  Thoroughly rinse the pads with clean water. Wring out the excess water and squeeze in a towel. Lay the pads out flat to air dry. It should take less than 60 minutes for them to dry.    Wipe the white plastic collar shell clean with mild soap and water.    Frequency / Duration of use    Overall benefit of brace wear will depend on compliance.  It will be important to follow-up your physician to determine whether optimal outcomes have been achieved.     Your physician will determine overall length of time you will need to wear your brace.      Potential Risks / Benefits    You must be lying flat to remove the collar unless your doctor gives you permission to do this in a sitting position.    Unless otherwise specified by your doctor: Do not remove the collar except to wash under it and change the pads.    How to report potential failure/malfunction   If your brace begins to no longer function as  originally fit or intended please [facility] at [phone].

## 2019-11-08 NOTE — Progress Notes (Signed)
Physical Therapy Treatment Note    Discharge Recommendations:  Pt is mainly limited in functional mobility by pain, decreased stability and weakness. Will continue to progress pt as able, however, pt will highly benefit from additional rehabilitation to help increase overall safety and independence with functional mobility prior to d/c home.      11/08/19 [place]   Visit Number   Visit Number [facility]) / Treatment Day [facility]) 3   Visit Details [facility])   Visit Type [facility]) Follow Up-General   Precautions/Observations   Precautions used Yes   Brace Applied Yes   Brace Type Neck brace   LDA Observation Foley cath   Was patient wearing a mask? No   PPE worn by Probation officer Gloves;Goggles;Mask   Fall Precautions General falls precautions   Pain Assessment   *Is the patient currently in pain? Yes   Pain (Before,During, After) Therapy During;After   0-10 Scale 10   Pain Location Neck;Head;Rib cage   Pain Orientation Left   Pain Descriptors Constant   Pain Intervention(s) Cold applied;Repositioned;Refer to nursing for pain management   Additional comments Pt reporting significant pain in neck and head with mobility   Cognition   Cognition Tested   Arousal/Alertness Appropriate responses to stimuli   Orientation A&Ox3   Following Commands Follows simple commands with increased time;Follows simple commands with repetition   Additional Comments Pt initially lethargic upon arrival but with improved responses throughout session. Pt requiring some repetition of cueing for safety and technique throughout session.    Bed Mobility   Bed mobility Tested   Supine to Sit Moderate assist ;1 person assist;Head of bed elevated;Side rails up (#)   Additional comments When performing supine to sitting transition, pt is able to assist with moving b/l LE over towards EOB. pt educated on UE placement to help with transition of trunk. Pt requiring modA to help fully transition trunk into upright sitting position with  initial sx of dizziness and increased neck pain with sitting balance. With increased time, pt reporting improvements over dizziness and is better able to support herself in sitting.    Transfers   Transfers Tested   Sit to Stand Moderate ;1 person assist   Stand to sit Minimum ;1 person assist   Transfer Assistive Device rolling walker;gait belt   Additional comments Pt is able to perform sit<>stand transfer from EOBx1 with use of Rw. Pt educated on appropriate UE placement for safety and technique with Rw use and use of anterior lean as momentum to help lift bottom off of sitting surface. Pt requiring modA to help with extension phase of transfer into full standing and min-modA to help with intiail standing stability. pt with good initiation of stand to sit transfer.   Mobility   Mobility: Gait/Stairs Tested   Gait Pattern Decreased cadence;Decreased R step length;Decreased L step length;Trunk flexed   Ambulation Assist Minimal;1 person assist  (second person chair follow for safety)   Ambulation Distance (Feet) 20'x1   Ambulation Assistive Device rolling walker;gait belt   Additional comments Pt is able to tolerate short distance ambulation with use of RW. Pt demonstrating slow cadence with small step length b/l. Pt with downward gaze and slight forward flexed position, c/o neck pain throughout. Pt with some lateral instability requiring minA to maintain safety.    Therapeutic Exercises   Additional comments Pt educated on the importance of frequent daily ambulation with use of RW and one assist to  help improve stability and strength during hospital stay.    Balance   Balance Tested   Sitting - Static Contact guard   Sitting - Dynamic Min assist   Standing - Static Min assist   Standing - Dynamic Min assist   PT AM-PAC Mobility   Turning over in bed? 1   Sitting down on and standing up from a chair with arms? 1   Moving from lying on back to sitting on the side of the bed? 1   Moving to and from a bed to a chair?  3   Need to walk in hospital room? 3   Climbing 3 - 5 steps with a railing? 1   Total Raw Score 10   Standardized Score - Calculated 32.29   % Functional Impairment - Calculated 77%   Physical Mobility Scale (PMS):    Physical Mobility - Supine to right sidelying 2   Physical Mobility - Supine to left sidelying 2   Physical Mobility - Supine to sit 2   Physical Mobility - Sitting balance 2   Physical Mobility - Sitting to standing 2   Physical Mobility - Standing to sitting 2   Physical Mobility - Standing balance 1   Physical Mobility - Transfers 3   Physical Mobility- Ambulation/Mobility 3   PMS Total Score (max 45) 19   Additional Comments   Additional comments Pt is mainly limited in functional mobility by pain, decreased stability and weakness. Will continue to progress pt as able, however, pt will highly benefit from additional rehabilitation to help increase overall safety and independence with functional mobility prior to d/c home.    Assessment   Brief Assessment Remains appropriate for skilled therapy   Patient / Family Goal To feel better   Plan/Recommendation   Treatment Interventions Restorative PT   PT Frequency 2-4 x/wk   Mobility Recommendations Ambulate short distance with RW, one assist and chair follow   Referral Recommendations PM&R Consult   Discharge Recommendations Rehab   PT Discharge Equipment Recommended To be determined   Assessment/Recommendations Reviewed With: Nursing;Patient   Next PT Visit Progress ambulation distance as able.    Time Calculation   Total Time Therapeutic Activities (minutes) 15   Total Time Gait Training (minutes) 10   Total Time Therapeutic Exercises (minutes) 0   Total Time Neuromuscular Re-education (minutes) 0   Total Time Group Therapy (minutes) 0   PT Timed Codes 25   PT Untimed Codes 0   PT Unbilled Time 0   PT Total Treatment 25   Plan and Onset date   Plan of Care Date 10/29/19   Onset Date 10/25/19   Treatment Start Date 10/29/19       Milus Glazier, PT,  DPT  Pager #: [phone]

## 2019-11-08 NOTE — Plan of Care (Signed)
Problem: Safety  Goal: Patient will remain free of falls  Outcome: Maintaining  Goal: Prevent any intentional injury  Outcome: Maintaining     Problem: Pain/Comfort  Goal: Patient's pain or discomfort is manageable  Outcome: Progressing towards goal     Problem: Nutrition  Goal: Nutritional status is maintained or improved - Geriatric  Outcome: Progressing towards goal     Problem: Mobility  Goal: Functional status is maintained or improved - Geriatric  Outcome: Progressing towards goal     Problem: Psychosocial  Goal: Demonstrates ability to cope with illness  Outcome: Maintaining     Problem: Cognitive function  Goal: Cognitive function will be maintained or return to baseline  Outcome: Maintaining     Problem: Elimination  Goal: Patient is able to empty bladder or return to baseline  Outcome: Progressing towards goal

## 2019-11-08 NOTE — Progress Notes (Signed)
SW met with patient and daughter [name] [phone] at bedside and obtained completed SNF paperwork. SW submitted paperwork to placement office for review. SW to continue to follow for discharge planning and will await bed offer.    Suzan Garibaldi, [place]

## 2019-11-08 NOTE — Continuity of Care (Signed)
[place] of Quality and Surveillance for Nursing Homes and ICFs/MR  Patient Name  [name] MR Number  [id number] Account Number   [facility]                Patient Review Instrument  (HC-PRI)               RUG II:  CB5     I. ADMINISTRATIVE DATA     1. Operating Certificate Number  [id number] 2. Social Security Number  [id number]   3. Official Name of Hospital Completing this Review  [facility]    4A. Patient Name  [name] 10. Sex  female   4B. [place] 11A. Date of Hospital Admission or Initial Agency Visit  10/25/2019   5. Date of [facility] Completion  November 08, 2019 11B. Date of Alternate level of Care Status in Hospital (if applicable)    6. Account Number   12. Medicaid Number  [id number]   7. Hospital Room Number  [phone] 13. Medicare Number  [id number]   3. Name of Unit/Division/Building  [phone] 14. Primary Payor  MEDICARE BLUE CHOICE   9. Date of Birth  [phone] 1. Reason for [facility] Completion  1 - RHCF application from hospital     II. MEDICAL EVENTS     16. Decubitus Level:        Location:  No reddened skin or breakdown     17. MEDICAL CONDITIONS During the past week:  1=Yes  2=No 18. MEDICAL TREATMENTS 1=Yes  2=No   A. Comatose No A. Tracheostomy Care/Suct No         B. Dehydration No B. Suctioning/General No         C. Internal Bleeding No C. Oxygen (Daily) No              D. Stasis Ulcer No D Respiratory Care (Daily) No         E. Terminally Ill No E. Nasal Gastric Feeding No         F. Contractures No F. Parenteral Feeding No         G. Diabetes Mellitus No G. Wound Care Yes (head)         H. Urinary Tract Infection No H. Chemotherapy No         I. HIV Infection Symptomatic No I. Transfusion No         J. Accident Yes (S/P fall) J. Dialysis No         K. Ventilator Dependent No K. Bowel and Bladder Rehab No           L. Catheter (Indwelling or External) Yes (foley catheter)               M. Physical Restraints  (Daytime) No             Adapted from [id number]    Version: 002F  Review Type: Update review  11/08/2019  2 of [place] of Quality and Surveillance for Nursing Homes and ICFs/MR  Patient Name  [name] MR Number  [id number] Account Number   [facility]                Patient Review Instrument  (HC-PRI)  III. Activities of Daily Living     19. Eating 2-Requires intermittent supervision (that is, verbal encouragement/guidance) and/or minimal physical assistance with minor parts of eating, such as cutting food, buttering bread or opening milk carton.    20. Mobility 3-Walks with constant one-to-one supervision and/or constant physical assistance.    21. Transfer 3-Requires one person to provide constant guidance, steadiness and/or physical assistance.  Patient may participate in transfer.    68. Toileting 3-Continent of bowel and bladder.  Requires constant supervision and/or physical assistance with major/all parts of the task, including appliances (i.e., colostomy, ilieostomy, urinary catheter). (foley catheter)      IV. BEHAVIORS     23. Verbal Disruption No known history   24. Physical Agression No known history   25. Disruptive, Infantile or Socially Inappropriate Behavior No known history   26. Hallucinations No     V. SPECIALIZED SERVICES     27A. Physical Therapy  27B. Occupational Therapy    Level: Restorative therapy- requires and is currently receiving physical and/or occupational therapy for the past week Level: Restorative therapy- requires and is currently receiving physical and/or occupational therapy for the past week   Actual days/week:2 No - less than 5 times/week Actual days/week:1 No - less than 5 times/wk   Actual hours/week:36mins No - less than 2.5 hrs/wk Actual hours/week:28mins No - less than 2.5 hrs/wk     28. Number of Physician Visits - 0     VI. DIAGNOSIS      29. Primary Problem: Closed odontoid fracture with type III morphology, initial encounter     VII. PLAN OF CARE SUMMARY     30. Primary Diagnosis: Encounter Diagnoses   Name Primary?    Closed odontoid fracture with type III morphology, initial encounter     CAD (coronary artery disease)     Fall from 15 stairs 6/15 type III odontoid fx, scalp lac, ?lumbar vein extrav, periaortic fat stranding, splenic lac, ?pancreatic injury, L 2, 5-6 rib fx, small extrapleural hematoma     Fall (on) (from) unspecified stairs and steps, initial encounter Yes            PMH: See below         PSH:  has a past surgical history that includes Coronary artery bypass graft (2013); Appendectomy; Tonsillectomy and adenoidectomy; Cholecystectomy; cyst removal; Coronary angioplasty with stent; pr repair eye blowout,periorbital (Right, 01/04/2016); and Achilles tendon surgery.   31A. Rehabilitation Potential: Remains appropriate for skilled therapy            Comment:    31B. Current therapy care plan: Treatment Interventions: Restorative PT  PT Frequency: 2-4 x/wk  Mobility Recommendations: Ambulate short distance with RW, one assist and chair follow  Referral Recommendations: PM&R Consult  Discharge Recommendations: Rehab  PT Discharge Equipment Recommended: To be determined  Assessment/Recommendations Reviewed With:: Nursing, Patient  Next PT Visit: Progress ambulation distance as able. .   OT Frequency: 3-5x/wk  Patient Will Benefit From: ADL retraining, Functional transfer training, Endurance training, Cognitive re-education, Patient/Family training, Equipment eval/education, Gross motor activities, Compensatory technique education, IADL training, Community re-entry, Exercises  Additional Comments: Fall from 15 stairs 6/15 type III odontoid fx, scalp lac, ?lumbar vein extrav, periaortic fat stranding, splenic lac, ?pancreatic injury, L 2, 5-6 rib fx, small extrapleural hematoma.   OT Discharge Recommendations:  (Rehab)  Additional  Comments: XX.            Comment:    29. Medications: See below  33A. Allergies: Morphine   33B: Treatments: See erecord kardex   33C: Abnormal Labs: See below   33D: Precautions:  fall           Comment:    33E: Pacemaker  no   5F: Diet: Diet regular  Dietary nutrition supplements adult:  Dietary nutrition supplements adult:          19. Race/Ethnic Group White or Caucasian [1]     35. Certification    I have personally observed/interviewed this patient and completed this H/C PRI. No - Staff/Chart           I certify that the information contained herein is a true abstract of this patient's condition and medical record. 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, on anticoagulation for ~2 years    PE (pulmonary embolism)     post-op after CABG, on anticoagulation for ~2 years    Prediabetes     Tubular adenoma 03/2015    colonoscopy        Medications:    Current Facility-Administered Medications   Medication Dose Route Frequency    melatonin tablet 3 mg  3 mg Oral Daily @ 1800    ARTIFICIAL TEARS (LUBRIFRESH PM,GENTEAL PM) ointment   Both Eyes 4 times per day    cefTRIAXone (ROCEPHIN) 1,000 mg in sterile water IV syringe 10 mL  1,000 mg Intravenous Q24H    clopidogrel (PLAVIX) tablet 75 mg  75 mg Oral Daily    ibuprofen (ADVIL,MOTRIN) tablet 600 mg  600 mg Oral Q6H    gabapentin (NEURONTIN) capsule 100 mg  100 mg Oral TID    oxyCODONE (ROXICODONE) IR tablet 2.5 mg  2.5 mg Oral Q6H PRN    Or    oxyCODONE (ROXICODONE) IR tablet 5 mg  5 mg Oral Q6H PRN    haloperidol lactate (HALDOL) injection 1 mg  1 mg Intravenous Q6H PRN    tamsulosin (FLOMAX) 24 hr capsule 0.4 mg  0.4 mg Oral  QPM    budesonide (PULMICORT) nebulizer solution 0.5 mg  0.5 mg Nebulization 2 times per day    atorvastatin (LIPITOR) tablet 40 mg  40 mg Oral Daily    enoxaparin (LOVENOX) injection 30 mg  30 mg Subcutaneous 2 times per day    lidocaine (LIDODERM) 5 % patch 1 patch  1 patch Transdermal Daily    acetaminophen (TYLENOL) tablet 1,000 mg  1,000 mg Oral Q8H    ondansetron (ZOFRAN) injection 4 mg  4 mg Intravenous Q6H PRN    albuterol (PROVENTIL) nebulization 2.5 mg  2.5 mg Nebulization Q6H PRN    bacitracin zinc-polymyxin B sulfate (POLYSPORIN) ointment   Topical 2 times per day    pantoprazole (PROTONIX) EC tablet 20 mg  20 mg Oral QAM    sertraline (ZOLOFT) tablet 100 mg  100 mg Oral Daily    ipratropium-albuterol (DUONEB) 0.5-2.5mg  /104mL nebulization solution 3 mL  3 mL Nebulization Q6H    sodium chloride 0.9 %  FLUSH REQUIRED IF PATIENT HAS IV  0-500 mL/hr Intravenous PRN    dextrose 5 % FLUSH REQUIRED IF PATIENT HAS IV  0-500 mL/hr Intravenous PRN       Abnormal Labs:    Recent Results (from the past 72 hour(s))   CBC and differential    Collection Time: 11/06/19  3:44 AM   Result Value Ref Range    WBC 11.8 (H) 4.0 - 10.0 THOU/uL    RBC 3.7 (L) 3.90 - 5.20 MIL/uL    Hemoglobin 10.6 (L) 11.2 - 15.7 g/dL    Hematocrit 34 34.00 - 45.00 %    MCV 92 79.0 - 95.0 fL    MCH 29 26 - 32 pg    MCHC 32 32 - 36 g/dL    RDW 13.8 11.7 - 14.4 %    Platelets 393 (H) 160 - 370 THOU/uL    Seg Neut % 65.9 %    Lymphocyte % 16.7 %    Monocyte % 11.4 %    Eosinophil % 1.7 %    Basophil % 0.6 %    Neut # K/uL 7.8 (H) 1.6 - 6.1 THOU/uL    Lymph # K/uL 2.0 1.2 - 3.7 THOU/uL    Mono # K/uL 1.4 (H) 0.2 - 0.9 THOU/uL    Eos # K/uL 0.2 0.0 - 0.4 THOU/uL    Baso # K/uL 0.1 0.0 - 0.1 THOU/uL    Nucl RBC % 0.0 0.0 - 0.2 /100 WBC    Nucl RBC # K/uL 0.0 0.0 - 0.0 THOU/uL    IMM Granulocytes # 0.4 (H) 0.0 - 0.0 THOU/uL    IMM Granulocytes 3.7 %   CBC and differential    Collection Time: 11/06/19 11:13 PM   Result Value Ref Range    WBC  10.8 (H) 4.0 - 10.0 THOU/uL    RBC 3.7 (L) 3.90 - 5.20 MIL/uL    Hemoglobin 10.6 (L) 11.2 - 15.7 g/dL    Hematocrit 34 34.00 - 45.00 %    MCV 92 79.0 - 95.0 fL    MCH 29 26 - 32 pg    MCHC 32 32 - 36 g/dL    RDW 13.9 11.7 - 14.4 %    Platelets 410 (H) 160 - 370 THOU/uL    Seg Neut % 68.9 %    Lymphocyte % 16.4 %    Monocyte % 10.0 %    Eosinophil % 1.5 %    Basophil % 0.7 %    Neut # K/uL 7.5 (H) 1.6 - 6.1 THOU/uL    Lymph # K/uL 1.8 1.2 - 3.7 THOU/uL    Mono # K/uL 1.1 (H) 0.2 - 0.9 THOU/uL    Eos # K/uL 0.2 0.0 - 0.4 THOU/uL    Baso # K/uL 0.1 0.0 - 0.1 THOU/uL    Nucl RBC % 0.2 0.0 - 0.2 /100 WBC    Nucl RBC # K/uL 0.0 0.0 - 0.0 THOU/uL    IMM Granulocytes # 0.3 (H) 0.0 - 0.0 THOU/uL    IMM Granulocytes 2.5 %   CBC and differential    Collection Time: 11/07/19 11:46 PM   Result Value Ref Range    WBC 13.2 (H) 4.0 - 10.0 THOU/uL    RBC 3.6 (L) 3.90 - 5.20 MIL/uL    Hemoglobin 10.3 (L) 11.2 - 15.7 g/dL    Hematocrit 32 (L) 34.00 - 45.00 %    MCV 91 79.0 - 95.0 fL    MCH  29 26 - 32 pg    MCHC 32 32 - 36 g/dL    RDW 13.9 11.7 - 14.4 %    Platelets 429 (H) 160 - 370 THOU/uL    Seg Neut % 72.5 %    Lymphocyte % 14.7 %    Monocyte % 8.3 %    Eosinophil % 1.7 %    Basophil % 0.8 %    Neut # K/uL 9.6 (H) 1.6 - 6.1 THOU/uL    Lymph # K/uL 1.9 1.2 - 3.7 THOU/uL    Mono # K/uL 1.1 (H) 0.2 - 0.9 THOU/uL    Eos # K/uL 0.2 0.0 - 0.4 THOU/uL    Baso # K/uL 0.1 0.0 - 0.1 THOU/uL    Nucl RBC % 0.0 0.0 - 0.2 /100 WBC    Nucl RBC # K/uL 0.0 0.0 - 0.0 THOU/uL    IMM Granulocytes # 0.3 (H) 0.0 - 0.0 THOU/uL    IMM Granulocytes 2.0 %   Basic metabolic panel    Collection Time: 11/07/19 11:46 PM   Result Value Ref Range    Glucose 125 (H) 60 - 99 mg/dL    Sodium 135 133 - 145 mmol/L    Potassium 3.4 3.3 - 5.1 mmol/L    Chloride 99 96 - 108 mmol/L    CO2 26 20 - 28 mmol/L    Anion Gap 10 7 - 16    UN 13 6 - 20 mg/dL    Creatinine 0.52 0.51 - 0.95 mg/dL    GFR,Caucasian 95 *    GFR,Black 110 *    Calcium 8.9 8.6 - 10.2 mg/dL   Magnesium     Collection Time: 11/07/19 11:46 PM   Result Value Ref Range    Magnesium 2.0 1.6 - 2.5 mg/dL   Phosphorus    Collection Time: 11/07/19 11:46 PM   Result Value Ref Range    Phosphorus 3.6 2.7 - 4.5 mg/dL

## 2019-11-09 DIAGNOSIS — W109XXA Fall (on) (from) unspecified stairs and steps, initial encounter: Secondary | ICD-10-CM

## 2019-11-09 LAB — PERFORMING LAB

## 2019-11-09 LAB — COVID-19 NAAT (PCR): COVID-19 NAAT (PCR): NEGATIVE

## 2019-11-09 LAB — COVID-19 PCR

## 2019-11-09 MED ORDER — POLYETHYLENE GLYCOL 3350 PO PACK 17 GM *I*
17.0000 g | PACK | Freq: Once | ORAL | Status: AC
Start: 2019-11-09 — End: 2019-11-09
  Administered 2019-11-09: 17 g via ORAL

## 2019-11-09 MED ORDER — IBUPROFEN 600 MG PO TABS *I*
600.0000 mg | ORAL_TABLET | Freq: Three times a day (TID) | ORAL | Status: AC | PRN
Start: 2019-11-09 — End: ?

## 2019-11-09 MED ORDER — MELATONIN 3 MG PO TABS *I*
3.0000 mg | ORAL_TABLET | Freq: Every evening | ORAL | Status: DC | PRN
Start: 2019-11-09 — End: 2020-02-15

## 2019-11-09 MED ORDER — SENNOSIDES 8.6 MG PO TABS *I*
2.0000 | ORAL_TABLET | Freq: Every day | ORAL | Status: DC
Start: 2019-11-10 — End: 2020-08-25

## 2019-11-09 MED ORDER — BACITRACIN-POLYMYXIN B 500-10000 UNIT/GM EX OINT *I*
TOPICAL_OINTMENT | Freq: Every day | CUTANEOUS | Status: DC
Start: 2019-11-09 — End: 2020-01-04

## 2019-11-09 MED ORDER — TAMSULOSIN HCL 0.4 MG PO CAPS *I*
0.4000 mg | ORAL_CAPSULE | Freq: Every evening | ORAL | Status: DC
Start: 2019-11-09 — End: 2020-01-04

## 2019-11-09 MED ORDER — ARTIFICIAL TEARS OP OINT *WRAPPED* *I*
TOPICAL_OINTMENT | OPHTHALMIC | Status: DC
Start: 2019-11-09 — End: 2020-11-16

## 2019-11-09 MED ORDER — LIDOCAINE 5 % EX PTCH *I*
1.0000 | MEDICATED_PATCH | Freq: Every day | CUTANEOUS | Status: DC
Start: 2019-11-10 — End: 2020-01-03

## 2019-11-09 MED ORDER — ACETAMINOPHEN 500 MG PO TABS *I*
1000.0000 mg | ORAL_TABLET | Freq: Three times a day (TID) | ORAL | Status: DC
Start: 2019-11-09 — End: 2019-11-09
  Administered 2019-11-09: 1000 mg via ORAL
  Filled 2019-11-09: qty 2

## 2019-11-09 MED ORDER — ACETAMINOPHEN 500 MG PO TABS *I*
1000.0000 mg | ORAL_TABLET | Freq: Three times a day (TID) | ORAL | Status: DC
Start: 2019-11-09 — End: 2020-11-16

## 2019-11-09 MED ORDER — POLYETHYLENE GLYCOL 3350 PO PACK 17 GM *I*
17.0000 g | PACK | Freq: Two times a day (BID) | ORAL | Status: DC
Start: 2019-11-09 — End: 2019-11-09

## 2019-11-09 MED ORDER — OXYCODONE HCL 5 MG PO TABS *I*
2.5000 mg | ORAL_TABLET | Freq: Four times a day (QID) | ORAL | 0 refills | Status: DC | PRN
Start: 2019-11-09 — End: 2020-01-03

## 2019-11-09 NOTE — Progress Notes (Signed)
[facility] Social Work Discharge    [place], 74 y.o. female    Destination: SNF  [facility], [address] [place], [place] [address]  Bed offer accepted? Yes    Admitted from SNF? No     Date/Time:  11/09/2019 12:00 PM    Mode of Transportation:  One man [place]:  [employer] - fax # [phone], phone # [phone]    Payor:  Medicaid  I-circle approval    Discharge Notification: Patient and Family  [name] [phone]    Additional comments: Patient had bed offer at [facility] and [facility] for rehab, patient and daughter [name] would like to pursue [facility] bed offer. Fiscal agreement reviewed with patient and verbal agreement obtained,      Suzan Garibaldi, LMSW

## 2019-11-09 NOTE — Progress Notes (Signed)
Discharge Note    1082 and AVS provided to [facility]. All questions answered, no concerns voiced. Verbalized understanding of all discharge instructions.      IV access removed  Patient left with foley, c-collar in place, and left with all belongings including home cpap machine  Patient dressed appropriately for the weather and discharged without issue.     Accompanied by:    Mode of transportation: wheelchair to medical transport.   Discharged to: [facility]      Report called to [facility] at 1240pm. Report given to [place]. All questions answered.     Signed by: Luevenia Maxin, RN as of 11/09/2019 at 12:58 PM

## 2019-11-09 NOTE — Discharge Summary (Signed)
Name: [name] MRN: [id number] DOB: Oct 01, 1945     Admit Date: 10/25/2019   Date of Discharge: 11/09/2019     Patient was accepted for discharge to   Home or Self Care [1]         Discharge Attending Physician: Magda Paganini, MD      Hospitalization Summary    CONCISE NARRATIVE: 74 yo female w pmhx CAD, aortic stenosis, CABG on plavix, OSA, and DM presented as level 2 trauma after fall down ~15 stairs on 10/25/19. She was found to have a type 3 odontoid fx, grade 1 splenic laceration, scalp laceration, extrapleural hematoma and multiple rib fxs. She was admitted to the [facility] for q1 hour pulse checks and hemodynamic monitoring.     Vascular surgery ordered CTA which showed left retroperitoneal hematoma near the psoas muscle. No aortic extravasation. Patient to follow-up in one month with Dr. Lissa Merlin.    Urology consulted for increasing left retroperitoneal hematoma with wall thickening of the left ureter, no intervention required and recommended follow-up renal US in one month. Additionally her hospital stay was complicated by urinary retention. Her foley was replaced on 6/28 and she continues on Flomax at discharge.     Neurosurgery was consulted for type III odontoid fracture. She was managed non operatively with [place] J collar at all times for 4-6 weeks and follow-up with Dr. Marianne Sofia in 4 weeks.     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 Would like patient to follow up outpatient for dry eyes.     Plastic surgery was consulted for scalp laceration repair. She underwent staple placement and they have since been removed. Recommended antibiotic ointment TID and follow up with Dr. Oswald Hillock in 1 week for a wound check.    Prior to discharge she was tolerating a diet, ambulating independently and voiding spontaneously.   Follow up PRN with trauma.                 OTHER IMAGING RESULTS: EKG 12 lead    Result Date: 10/28/2019  Sinus rhythm Prolonged PR interval Nonspecific T  abnormalities, diffuse leads    EKG 12 lead    Result Date: 10/28/2019  Sinus rhythm Prolonged PR interval Low voltage, precordial leads Borderline T abnormalities, anterior leads    EKG 12 lead    Result Date: 10/26/2019  Sinus rhythm Prolonged PR interval Nonspecific T abnormalities, diffuse leads    MR cervical spine without contrast    Result Date: 10/28/2019  Type III odontoid fracture, with 2 mm posterior displacement of the odontoid fracture fragment, extension into the right C2 lateral mass, and disruption of the posterior longitudinal ligament. Injury to the anterior longitudinal ligament may also be present. END OF IMPRESSION I have personally reviewed the images and the Resident's/Fellow's interpretation and agree with or edited the findings. [facility] submits this DICOM format image data and final report to the [facility], an independent secure electronic health information exchange, on a reciprocally searchable basis (with patient authorization) for a minimum of 12 months after exam date.    CT angio chest    Addendum Date: 10/26/2019    -------- ADDENDUM #2 -------- Findings discussed by phone with Dr. Melene Muller from vascular surgery 10/26/2019 at approximately 6:25 AM. Follow-up is deferred to the vascular surgery service. [facility] submits this DICOM format image data and final report to the [facility], an independent secure electronic health information exchange, on a reciprocally searchable basis (with patient  authorization) for a minimum of 12 months after exam date.-------- ADDENDUM #1 -------- While overall there is less periaortic hematoma, much of the decrease is seen more inferiorly; this hematoma appears slightly increased adjacent to some atelectatic lung on [id number] which may be due to redistribution. No contrast extravasation. Vascular surgery consultation would be helpful. Would recommend reimaging with CTA at 48 hours from this exam, followed by CTA in one month if findings are  stable or improving. Discussed by phone with Glorianne Manchester 6:10 AM 10/26/2019. [facility] submits this DICOM format image data and final report to the [facility], an independent secure electronic health information exchange, on a reciprocally searchable basis (with patient authorization) for a minimum of 12 months after exam date.    Addendum Date: 10/26/2019    -------- ADDENDUM #1 -------- While overall there is less periaortic hematoma, much of the decrease is seen more inferiorly; this hematoma appears slightly increased adjacent to some atelectatic lung on 501-243 which may be due to redistribution. No contrast extravasation. Vascular surgery consultation would be helpful. Would recommend reimaging with CTA at 48 hours from this exam, followed by CTA in one month if findings are stable or improving. Discussed by phone with Glorianne Manchester 6:10 AM 10/26/2019. [facility] submits this DICOM format image data and final report to the [facility], an independent secure electronic health information exchange, on a reciprocally searchable basis (with patient authorization) for a minimum of 12 months after exam date.    Result Date: 10/26/2019  There is a 5 mm linear filling defect in the aortic arch posterior medially just distal to the left subclavian artery origin without contour deformity or surrounding hematoma, compatible with intimal injury. This is categorized as minimal aortic injury which is amenable to nonoperative or medical management. Generally, suggested follow-up CTA is at 48 to 72 hours, then repeat CTA at 1 month. This finding has not changed significantly in retrospect compared to 10/25/2019 at 9:02 PM. Note that these injuries are associated with periaortic hematomas. Interval slight decrease in size of the periaortic hematoma involving the mid to lower thoracic and upper abdominal aorta. Aorta at these levels appears normal. No contrast extravasation. Left-sided rib fractures with small extra pleural  hematoma. Small focal areas of groundglass opacities in the left lung apex compatible with pulmonary contusion, not significantly changed. END OF IMPRESSION I have personally reviewed the images and the Resident's/Fellow's interpretation and agree with or edited the findings. [facility] submits this DICOM format image data and final report to the [facility], an independent secure electronic health information exchange, on a reciprocally searchable basis (with patient authorization) for a minimum of 12 months after exam date.    CT angio neck carotid    Result Date: 10/25/2019  No evidence of acute carotid or vertebral artery dissection in the neck. Severe left and moderate-to-severe right internal carotid narrowing just distal to the bifurcation likely due to chronic atherosclerotic changes with greater than 70% stenosis bilaterally. END OF IMPRESSION I have personally reviewed the images and the Resident's/Fellow's interpretation and agree with or edited the findings. [facility] submits this DICOM format image data and final report to the [facility], an independent secure electronic health information exchange, on a reciprocally searchable basis (with patient authorization) for a minimum of 12 months after exam date.    CT abdomen and pelvis with IV contrast    Result Date: 10/25/2019  There is fat stranding and possible extravasation from the left lumbar vein and  thickening of the medial crus of the left hemidiaphragm. Adjacent to this site, there is a moderate degree of fat stranding around the aorta. This stranding is likely an extension of the injury to the left lumbar vein with venous bleeding. However, an aortic injury cannot be entirely ruled out. Small splenic laceration and perisplenic hematoma, consistent with a low grade splenic injury. 3 cm hematoma adjacent to the pancreatic tail may represent a low grade pancreatic tail injury. Mild endometrial thickening/low-attenuation material in the  endometrial canal, more than expected for age. Recommend further evaluation with pelvic ultrasound on a nonemergent basis. Significant findings were conveyed to Dr. Marijean Bravo via phone by Dr. Oleta Mouse at 10:03 PM on 10/25/2019. END OF IMPRESSION I have personally reviewed the images and the Resident's/Fellow's interpretation and agree with or edited the findings. [facility] submits this DICOM format image data and final report to the [facility], an independent secure electronic health information exchange, on a reciprocally searchable basis (with patient authorization) for a minimum of 12 months after exam date.    CT chest with IV contrast    Result Date: 10/26/2019  Mildly displaced left posterior second rib fracture with small adjacent extrapleural hematoma. There are additional nondisplaced fractures of the posterior left fifth and sixth ribs. Few small areas of groundglass opacities within the left lung apex, likely representing pulmonary contusion/hemorrhage. There is soft tissue stranding around the mid descending thoracic aorta with extension to the level of the abdominal aorta. This may represent small hematoma formation. No active extravasation is visualized. This can be from a venous injury or adjacent focal diaphragmatic injury. If clinically indicated, a repeat CT can be obtained in 12-24 hours with delayed phase images to evaluate for any persistent venous or occult aortic injury. No contour abnormality in thoracic aorta. No aortic pseudoaneurysm formation. Please see separately dictated CT abdomen and pelvis report for findings below the diaphragm and CT thoracic spine report for additional details. END OF IMPRESSION I have personally reviewed the images and the Resident's/Fellow's interpretation and agree with or edited the findings. [facility] submits this DICOM format image data and final report to the [facility], an independent secure electronic health information exchange, on a reciprocally  searchable basis (with patient authorization) for a minimum of 12 months after exam date.    CT maxillofacial without contrast    Result Date: 10/25/2019  No acute fracture of the maxillofacial skeleton. Partially visualized type III dens fracture. Please refer to separately dictated CT cervical spine for further description.. END OF IMPRESSION [facility] submits this DICOM format image data and final report to the [facility] an independent secure electronic health information exchange, on a reciprocally searchable basis (with patient authorization) for a minimum of 12 months after exam date.    CT head without contrast for TRAUMA    Result Date: 10/25/2019  No acute intracranial abnormality. No acute intracranial hemorrhage. No displaced calvarial fracture. Partly visualized likely type III odontoid fracture. Please see separately dictated CT cervical spine for further evaluation. Large approximate 9 cm hematoma at the right frontal scalp and a large laceration to the left parietal scalp as described above. Please see additional separately dictated CT reports for further evaluation Findings discussed with covering provider Dr. Ernie Hew via phone at 9:20 PM on 10/25/2019. END OF IMPRESSION I have personally reviewed the images and the Resident's/Fellow's interpretation and agree with or edited the findings. [facility] submits this DICOM format image data and final report to the  [facility], an independent secure electronic health information exchange, on a reciprocally searchable basis (with patient authorization) for a minimum of 12 months after exam date.    * Abdomen standard AP single view/KUB    Result Date: 11/01/2019  Mildly dilated air-filled colonic loops extending to the rectum which may represent ileus. END OF IMPRESSION I have personally reviewed the images and the Resident's/Fellow's interpretation and agree with or edited the findings. [facility] submits this DICOM format image data and final report  to the [facility], an independent secure electronic health information exchange, on a reciprocally searchable basis (with patient authorization) for a minimum of 12 months after exam date.    *Chest STANDARD single view    Result Date: 11/01/2019  Mild increase in left pleural effusion. END OF IMPRESSION [facility] submits this DICOM format image data and final report to the [facility], an independent secure electronic health information exchange, on a reciprocally searchable basis (with patient authorization) for a minimum of 12 months after exam date.    *Chest STANDARD single view    Result Date: 10/29/2019  1.  Interval increase in opacity of the left lower lung when compared with prior imaging, findings concerning for atelectasis and pleural fluid, cannot exclude an aspiration element given asymmetry. 2.  Interstitial markings are prominent as can be seen in the setting of edema. END OF IMPRESSION [facility] submits this DICOM format image data and final report to the [facility], an independent secure electronic health information exchange, on a reciprocally searchable basis (with patient authorization) for a minimum of 12 months after exam date.    Chest single frontal view    Result Date: 10/25/2019  Mildly displaced left upper rib fractures, involving at least the second rib. Small adjacent extrapleural hematoma Hypoexpanded lungs. END OF IMPRESSION I have personally reviewed the images and the Resident's/Fellow's interpretation and agree with or edited the findings. [facility] submits this DICOM format image data and final report to the [facility], an independent secure electronic health information exchange, on a reciprocally searchable basis (with patient authorization) for a minimum of 12 months after exam date.    CT lumbar spine reprocess from CT    Result Date: 10/25/2019  No acute lumbosacral fracture. Please see the concurrent CT abdomen and pelvis report for a complete discussion of  intra-abdominal findings. END OF IMPRESSION I have personally reviewed the images and the Resident's/Fellow's interpretation and agree with or edited the findings. [facility] submits this DICOM format image data and final report to the [facility], an independent secure electronic health information exchange, on a reciprocally searchable basis (with patient authorization) for a minimum of 12 months after exam date.    CT thoracic spine reprocess from CT    Result Date: 10/25/2019  Motion artifact degrades image quality and limits sensitivity of the examination. No acute thoracic spine fracture. END OF IMPRESSION [facility] submits this DICOM format image data and final report to the [facility], an independent secure electronic health information exchange, on a reciprocally searchable basis (with patient authorization) for a minimum of 12 months after exam date.    CT angio abdomen and pelvis    Result Date: 10/26/2019  Stable fat stranding/hematoma about the upper abdominal aorta. There is increased hematoma in the left retroperitoneum and abutting the psoas muscle, but this does not appear to be associated with the abdominal aorta and may be associated with venous bleed. Discussed with Dr. Virl Cagey from vascular  surgery by phone 6:25 AM 10/26/2019. There is no evidence of extravasation of contrast from the abdominal aorta . No direct injury seen to the abdominal aorta. There is some wall thickening of the proximal to mid ureter without contrast extravasation, suspicious for possible mural injury to the ureter. There is mild associated left hydronephrosis. Consider short interval follow-up ultrasound rule out increasing  hydronephrosis. Stable 3 cm hematoma adjacent pancreatic tail. Follow-up of lipase/amylase would be helpful to rule out any posttraumatic pancreatitis involving the pancreatic tail. Small hematoma along left lower abdominal wall laterally. Small amount of blood now in the left anterior pelvis just  deep to the ventral abdominal wall. No active bleeding visualized. END OF IMPRESSION I have personally reviewed the images and the Resident's/Fellow's interpretation and agree with or edited the findings. [facility] submits this DICOM format image data and final report to the [facility], an independent secure electronic health information exchange, on a reciprocally searchable basis (with patient authorization) for a minimum of 12 months after exam date.    CT Cervical Spine Reprocess from CT    Result Date: 10/25/2019  Acute mildly displaced type III odontoid fracture without narrowing of the central spinal canal. Please see additional separately dictated CT reports. Findings discussed with covering provider Dr. Ernie Hew via phone at 9:20 PM on 10/25/2019. END OF IMPRESSION I have personally reviewed the images and the Resident's/Fellow's interpretation and agree with or edited the findings. [facility] submits this DICOM format image data and final report to the [facility], an independent secure electronic health information exchange, on a reciprocally searchable basis (with patient authorization) for a minimum of 12 months after exam date.                 Signed: Candiss Norse. Rickey Sadowski, NP  On: 11/09/2019  at: 11:24 AM

## 2019-11-09 NOTE — Progress Notes (Addendum)
Nursing report # for [facility]: [phone]    Unit: [address]     [name], BSW   [phone]

## 2019-11-09 NOTE — Progress Notes (Signed)
Trauma & Acute Care Surgery  Progress Note                                                  LOS: 15 days        Attending: Rosey Bath         Brief Description     [name] is a 74 y.o. female admitted secondary to fall from stairs.     Interval Events / Subjective     Pt's foley was d/c's yesterday ~4pm. Failed voiding trials and retaining. Bladder scanned 938mL. New foley placed.     Pt complaining of "churning" feeling in belly again. Bowel reg ordered. Having BMs per chart.     Objective     Vital Signs:  Temp:  [35.6 C (96.1 F)-36.2 C (97.2 F)] 35.6 C (96.1 F)  Heart Rate:  [67-79] 69  Resp:  [16-18] 16  BP: (122-138)/(64-70) 138/70    I/Os:  I/O last 3 completed shifts:  06/27 2300 - 06/28 2259  In: 1395 (11.4 mL/kg) [P.O.:1395]  Out: 975 (7.9 mL/kg) [Urine:975 (0.3 mL/kg/hr)]  Net: 420  Weight: 122.9 kg     GEN: resting comfortably, NAD  HEENT: NC/AT  CV: regular rate   PULM: unlabored respirations  ABD: soft/nontender/nondistended  EXT: WWP, b/l radial, DP/PT pulses palpable  NEURO: alert, motor/sensation grossly intact, no focal deficits       Labs  BMP/Electrolytes CBC   Recent Labs     11/07/19  2346   Sodium 135   Potassium 3.4   Chloride 99   CO2 26   UN 13   Creatinine 0.52   Glucose 125*   Calcium 8.9   Magnesium 2.0   Phosphorus 3.6    Recent Labs     11/08/19  2305 11/07/19  2346 11/06/19  2313   WBC 13.4* 13.2* 10.8*   Hemoglobin 10.8* 10.3* 10.6*   Hematocrit 35 32* 34   Platelets 438* 429* 410*   MCV 92 91 92   RDW 14.4 13.9 13.9   Seg Neut % 71.4 72.5 68.9   Lymphocyte % 15.7 14.7 16.4   Monocyte % 9.0 8.3 10.0   Eosinophil % 1.7 1.7 1.5   Basophil % 0.7 0.8 0.7      Liver Function Cardiac Studies   No results for input(s): AST, ALT, HTBIL, TB, DB in the last 72 hours.    No components found with this basename: ALKPHOS No results for input(s): CKTS, TROPU, TROP, MCKMB, CKMB in the last 168 hours.    No components found with this basename: RICKMBS    Coagulation Glucose        Lab results: 10/25/19  2048   Protime 11.6   INR 1.0           Lab results: 10/25/19  2048   aPTT 22.4*          Lab results: 11/07/19  2346 11/04/19  2358 11/03/19  0025 11/02/19  0121 11/01/19  0039   Glucose 125* 104* 105* 101* 130*       Lab Results   Component Value Date    HA1C 5.9 (H) 08/11/2019        Imaging  No results found.       Assessment / Plan    [name] is a 74  y.o. female with h/o CAD, DVT, PE, allergic rhinitis. Her injuries were managed non operatively.     Injuries include:   Right periorbital hematoma   Right supraorbital skin abrasion   Right frontal scalp hematoma   L parietal scalp laceration    Consulting services & recommendations:  - Neurosurgery- c-collar 4-6 weeks, f/u with Dr. Marianne Sofia  - Ophthalmology- no intervention, anisocoria 2/2 nebulizer use, artificial tears QID   -Plastic Surgery-would recommend against removing staples, f/u Dr. Waldron Session 7 days.  -Anesthesiology- signed off     GI: regular diet , antiemetics PRN zofran, bowel reg   Renal / Fluids: No indication for IVF, replete lytes PRN. Foley replaced 6/28   CV: VSS, hemodynamically stable   Respiratory: Incentive spirometer, room air    ID: Monitor CBC, Ceftriaxone 1g x7 days. WBC 13.4   Analgesia: Oxycodone 5/10mg , tylenol ATC    Activity: as tolerated, PT/OT-SNF vs. 512     DVT ppx: IPCs, lovenox   Dispo: SNF plan vs 512.       Author: Candiss Norse. Kurt Azimi, NP on: 11/09/2019

## 2019-11-09 NOTE — Discharge Instructions (Signed)
Date: 11/09/2019    Brief Summary of your Hospital Stay (including key procedures and diagnostic test results):  You were admitted to the hospital on 10/25/2019 after a fall that resulted in the following injuries   Right periorbital hematoma   type III odontoid fracture    Grade 1 splenic laceration   Multiple rib fractures   Right supraorbital skin abrasion   Right frontal scalp hematoma   L parietal scalp laceration    Vascular surgery ordered CTA which showed left retroperitoneal hematoma near the psoas muscle. No aortic extravasation. Patient to follow-up on 7/28 with Dr. Lissa Merlin.     Urology consulted for increasing left retroperitoneal hematoma with wall thickening of the left ureter, no intervention required and recommended follow-up renal US on 7/29 with Dr. Dortha Kern. If questions regarding void trial in 1-2 weeks please contact their office for earlier appointment time or questions regarding foley or urinary retention.     Neurosurgery was consulted for odontoid fracture. She was managed non operatively with for [facility] J collar at all times for 4-6 weeks and follow-up with Dr. Marianne Sofia.     Ophthalmology consulted, no intervention necessary. 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 Recommendations include Artificial tears (e.g., refresh plus) QID in both eyes for drynessWould like patient to follow up outpatient for dry eyes.    Plastic Surgery service was consulted for repair of your scalp laceration. Your laceration was repaired with staples and have since been removed. Recommendations include Local wound care to lacerations with gentle cleansing and antibiotic ointment TID. Follow up with Dr. Oswald Hillock in one week for a wound check.    On the day of discharge, you were ambulating, tolerating diet, voiding adequately on your own, had good pain control, and your bowels were functioning.    Pending Labs: none    Call Dr. Magda Paganini, MD or go to the nearest Emergency Department for evaluation of:  Fever of  101F. or greater  Shaking chills  Nausea and / or vomiting  Diarrhea  Not able to have a BM  Not able to pass gas     Call Dr. Marianne Sofia, Neurosurgery, or go to the nearest Emergency Department for evaluation of:  Questions or concerns regarding your odontoid fracture  Questions regarding [place] J collar  Uncontrolled pain to neck    Call Dr. Dortha Kern, Urology, or go to the nearest Emergency Department for evaluation of:  Questions or concerns regarding foley management     Diet: Regular    Activity: No strenuous activity, pushing or pulling for 4 weeks   [place] J collar in place    Wound Care: Wash incision with antibacterial soap  Take a shower, pat incision dry    Pain Management: Resume home medications. It is important to keep your pain controlled so that you are able to take deep breaths and cough. However, the use of a narcotic pain medication can be constipating. Please continue to walk, drink plenty of fluids, and use an over the counter stool softener such as Colace (Docusate Sodium) and/or Senna as needed for constipation. Do not operate heavy machinery, drive, or make important decisions while on narcotic pain medications because narcotics may impair your reflex speed and judgement. You may wean your narcotic pain medicines as tolerated to regular over-the-counter pain medicines such as acetaminophen (Tylenol - avoid if you have liver disease) or ibuprofen (Motrin, Advil - avoid if you have kidney disease). Be careful not to exceed 4000 mg of acetaminophen in a day from  all sources (Tylenol with Codeine, Norco and Percocet contain acetaminophen).    Antibiotics: You have not been prescribed antibiotics to start after discharge from the hospital. You have received sufficient microbial prophylaxis in the hospital and do not need additional antibiotics after discharge.    Smoking: Smoking can reduce the quality of your wound healing and increase your chances of wound infections, as well as increase your chances  of developing chronic health problems or worsen conditions you already have. If you smoke, you should quit. Smoking cessation information is available for your review to help you quit. Medications to help you quit are available. Ask your doctor Laurance Flatten, Guy Begin, MD) if you would like to receive these medications.    Diabetes: If you are diabetic, check your blood sugar at least daily. Poor blood sugar control can lead to delayed wound healing. Follow up with [name], MD to discuss your diabetic care regimen.    Other Instructions: Follow up as instructed.    Author: Candiss Norse. Malachy Coleman, NP as of: 11/09/2019  at: 10:53 AM

## 2019-11-09 NOTE — Progress Notes (Signed)
[facility]   Transportation Request Form / Physician Certification Statement     Patient Name:  [name]     Date of Birth:  [birth year]   MRN: [id number]    Date of Service: 11/09/19  Requested Time of Pick up: 12pm    Patient Location:  [facility], [place]  [address]    Patient Destination:SNF:  [facility], [address] [place], [place] [address]    Number of steps into house?: 0    Requestors Name: Suzan Garibaldi, [place] Call Back Number: [phone]     Payor: Medicaid    Transport for (check what type of treatment or service, at least one):  Discharge    Specify what type of treatment or service: Discharge to SNF     Is this treatment or service available at sending facility?:  no    Requested Mode of Transport: One man Chairmobile:  Leg extension required:  yes, Patient has own chair:  No and Wheelchair Size:  Standard   Height: Height: 177.8 cm (5\' 10" )  Weight: Weight: 122.9 kg (270 lb 15.1 oz)   Round Trip/One Way: One Way    VENDOR: [employer]:  Fax # [phone]; Phone # [phone]     1. Medical condition that necessitates this mode of transport (i.e. oxygen, bed ridden, etc.): pt will need wheelchair transport to SNF    2. What medical services are to be provided by crew?: None    3. Infection control needs (i.e. ORSA/VRE/Cdiff): no    4. What specific handling is required?: Positioning  Fall Precaution    5.  Patient mental status?: Normal Cognition    6. At time of transport is bed confinement ordered?: no        Is patient bed confined? no   Medical condition for bed confinement: no    Electronic Signature: Suzan Garibaldi, LMSW      Date:  11/09/19    Physical Signature: _______________________________________       Title: Discharge Planner

## 2019-11-10 NOTE — Telephone Encounter (Signed)
In System Transitions Care Management Documentation:     Hospital Admission Date/Discharge Date:   10/25/2019 To 11/09/2019    Discharge Diagnosis at the time of discharge:    1. Fall (on) (from) unspecified stairs and steps, initial encounter    2. Closed odontoid fracture with type III morphology, initial encounter    3. CAD (coronary artery disease)    4. Fall from 15 stairs 6/15 type III odontoid fx, scalp lac, ?lumbar vein extrav, periaortic fat stranding, splenic lac, ?pancreatic injury, L 2, 5-6 rib fx, small extrapleural hematoma      Hospital Area Discharged From:   [facility])     Discharge Disposition:    6.29.21   Mode of transportation: wheelchair to medical transport.   Discharged to: [facility]    Nursing report # for [facility] SNF: [id number]  Unit: [place] at Discharge:         Course of Hospital Stay: CONCISE NARRATIVE: 74 yo female w pmhx CAD, aortic stenosis, CABG on plavix, OSA, and DM presented as level 2 trauma after fall down ~15 stairs on 10/25/19. She was found to have a type 3 odontoid fx, grade 1 splenic laceration, scalp laceration, extrapleural hematoma and multiple rib fxs. She was admitted to the [facility] for q1 hour pulse checks and hemodynamic monitoring.     Vascular surgery ordered CTA which showed left retroperitoneal hematoma near the psoas muscle. No aortic extravasation. Patient to follow-up in one month with Dr. Lissa Merlin.    Urology consulted for increasing left retroperitoneal hematoma with wall thickening of the left ureter, no intervention required and recommended follow-up renal US in one month. Additionally her hospital stay was complicated by urinary retention. Her foley was replaced on 6/28 and she continues on Flomax at discharge.     Neurosurgery was consulted for type III odontoid fracture. She was managed non operatively with [place] J collar at all times for 4-6 weeks and follow-up with Dr. Marianne Sofia in 4 weeks.          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 Would like patient to follow up outpatient for dry eyes.     Plastic surgery was consulted for scalp laceration repair. She underwent staple placement and they have since been removed. Recommended antibiotic ointment TID and follow up with Dr. Oswald Hillock in 1 week for a wound check.    Prior to discharge she was tolerating a diet, ambulating independently and voiding spontaneously.   Follow up PRN with trauma.     Medications: New, Changed, or Stopped:     Medication List      START taking these medications    acetaminophen 500 mg tablet  Commonly known as: TYLENOL  Take 2 tablets (1,000 mg total) by mouth 3 times daily     ARTIFICIAL TEARS ointment  Please administer every 6 hours to both eyes     bacitracin zinc-polymyxin B sulfate ointment  Commonly known as: POLYSPORIN  Apply topically daily to the following areas: scalp laceration     ibuprofen 600 MG tablet  Commonly known as: ADVIL,MOTRIN  Take 1 tablet (600 mg total) by mouth 3 times daily as needed for Pain     lidocaine 5 % patch  Commonly known as: LIDODERM  Apply 1 patch onto the skin daily to the following areas: chest. Remove and discard patch within 12 hours or as directed.     melatonin 3  MG  Take 1 tablet (3 mg total) by mouth nightly as needed for Sleep     oxyCODONE 5 MG immediate release tablet  Commonly known as: ROXICODONE  Take 0.5 tablets (2.5 mg total) by mouth every 6 hours as needed Max daily dose: 10 mg     senna 8.6 MG tablet  Commonly known as: SENOKOT  Take 2 tablets by mouth daily     tamsulosin 0.4 MG capsule  Commonly known as: FLOMAX  Take 1 capsule (0.4 mg total) by mouth every evening        CHANGE how you take these medications    atorvastatin 40 MG tablet  Commonly known as: LIPITOR  What changed: Another medication with the same name was removed. Continue taking this medication, and follow the directions you see here.        STOP taking these  medications    docusate sodium 100 MG capsule  Commonly known as: COLACE           Where to Get Your Medications      Information about where to get these medications is not yet available    Ask your nurse or doctor about these medications   acetaminophen 500 mg tablet   ARTIFICIAL TEARS ointment   bacitracin zinc-polymyxin B sulfate ointment   ibuprofen 600 MG tablet   lidocaine 5 % patch   melatonin 3 MG   oxyCODONE 5 MG immediate release tablet   senna 8.6 MG tablet   tamsulosin 0.4 MG capsule         Future Appointments        Nov 22 2019   10:00 AM - SPIROMETRY &DIFFUS CAPACITY  [facility]           Nov 22 2019   10:30 AM - FOLLOW UP VISIT  [facility] - Napoleon Form, NP           Dec 01 2019   09:45 AM - US Renal Retroperitoneal Complete  [facility] [place] [facility]    2021   09:30 AM - FOLLOW UP VISIT  [facility] [place] [facility] - Randolm Idol, MD           Dec 09 2019   01:15 PM - [facility] - Margaretha Glassing, MD            Displaying the next 5 appointments. This patient has additional appointments scheduled.         Upcoming Appointments and To Do's:   Your To Do List     Mable Paris, MD In 4 weeks.    Specialty: Neurosurgery  Why: for odontoid fracture  Contact information  [address]  [place] [phone]  [phone]             Reavey, Shirlee Latch, MD. Schedule an appointment as soon as possible for a visit in 1 week.    Specialties: Plastic Surgery, Wound  Why: for a wound check  Contact information  [address]  [place] [address]  [phone]             [name], MD .    Specialties: Primary Care, Family Medicine  Contact information  [address]  [address]  [place] [address]  [place], RN  11/10/2019

## 2019-11-19 ENCOUNTER — Encounter: Payer: Self-pay | Admitting: Pulmonology

## 2019-11-19 ENCOUNTER — Telehealth: Payer: Self-pay | Admitting: Primary Care

## 2019-11-19 NOTE — Telephone Encounter (Signed)
Daughter needs a form " a reasonable accommodation form to be completed for a specialized tub for her home. Daughter will pick up

## 2019-11-22 ENCOUNTER — Other Ambulatory Visit: Payer: Medicare (Managed Care)

## 2019-11-22 ENCOUNTER — Ambulatory Visit: Payer: Medicare (Managed Care) | Admitting: Pulmonology

## 2019-11-22 NOTE — Telephone Encounter (Signed)
Pt went to ED on '6/14 due to  fell from stairs, had a TCM visit but was canceled on 6/17 says per patient , have paperwork for reasonable accommodation request form to change tub to a step in or cut out tub, form reviewed and put in Dr.Moore inbox for completion and signature , no follow up in office noted

## 2019-11-23 ENCOUNTER — Encounter: Payer: Self-pay | Admitting: Gastroenterology

## 2019-11-23 NOTE — Telephone Encounter (Signed)
Patients daughter came in to office and picked up form

## 2019-11-23 NOTE — Telephone Encounter (Signed)
Form completed, thanks  Darrall Strey N Rodriguez, MD

## 2019-11-23 NOTE — Telephone Encounter (Signed)
I spoke to [name] and let her know that the form is ready for pick up. The original is in the patient pick up folder. A copy was put into scanning.

## 2019-11-29 ENCOUNTER — Other Ambulatory Visit: Payer: Medicare (Managed Care)

## 2019-12-01 ENCOUNTER — Other Ambulatory Visit: Payer: Medicare (Managed Care)

## 2019-12-03 ENCOUNTER — Telehealth: Payer: Self-pay | Admitting: Urology

## 2019-12-03 ENCOUNTER — Encounter: Payer: Self-pay | Admitting: Primary Care

## 2019-12-03 NOTE — Telephone Encounter (Signed)
Copied from [place] #[id number]. Topic: Information Request - Other  >> Dec 03, 2019  1:09 PM Hanley Ben wrote:  The patient is requesting to speak with someone from Margaretha Glassing, MD office. . Patient states it is in regards to being unable to have ultrasound due to still having catheter in.      Please call the patient back at [phone] to discuss.

## 2019-12-03 NOTE — Telephone Encounter (Signed)
Spoke with patient and she stated had ultrasound today.

## 2019-12-06 ENCOUNTER — Encounter: Payer: Self-pay | Admitting: Primary Care

## 2019-12-06 ENCOUNTER — Other Ambulatory Visit
Admission: RE | Admit: 2019-12-06 | Discharge: 2019-12-06 | Disposition: A | Discharge status: Home / Self Care | Payer: Medicare (Managed Care) | Source: Ambulatory Visit

## 2019-12-07 LAB — COVID-19 PCR

## 2019-12-07 LAB — COVID-19 NAAT (PCR): COVID-19 NAAT (PCR): NEGATIVE

## 2019-12-08 ENCOUNTER — Ambulatory Visit: Payer: Medicare (Managed Care) | Admitting: Vascular Surgery

## 2019-12-09 ENCOUNTER — Ambulatory Visit: Payer: Medicare (Managed Care) | Admitting: Urology

## 2019-12-09 ENCOUNTER — Encounter: Payer: Self-pay | Admitting: Urology

## 2019-12-09 VITALS — Ht 70.0 in | Wt 270.0 lb

## 2019-12-09 DIAGNOSIS — R935 Abnormal findings on diagnostic imaging of other abdominal regions, including retroperitoneum: Secondary | ICD-10-CM

## 2019-12-09 DIAGNOSIS — R339 Retention of urine, unspecified: Secondary | ICD-10-CM

## 2019-12-09 DIAGNOSIS — R9389 Abnormal findings on diagnostic imaging of other specified body structures: Secondary | ICD-10-CM

## 2019-12-09 MED ORDER — NITROFURANTOIN MACROCRYSTAL 100 MG PO CAPS *I*
100.0000 mg | ORAL_CAPSULE | Freq: Once | ORAL | Status: AC
Start: 2019-12-09 — End: 2019-12-09
  Administered 2019-12-09: 100 mg via ORAL

## 2019-12-09 NOTE — Progress Notes (Signed)
Patient presented to the office in a wheelchair. Patient opted to have catheter removed without void trial. Dr. Dortha Kern in agreement with catheter removal only. Foley catheter removed with no issues. Dr. Priscille Heidelberg note from today's office visit printed, highlighted, and placed on top of paperwork to be given to [facility] to review. Patient also educated on instructs given on paperwork. Patient given x1 nitrofurantoin per Dr. Priscille Heidelberg order, see MAR. Daughter present for visit and was able to wheel patient back out to check out.     Corie Chiquito, RN

## 2019-12-09 NOTE — Progress Notes (Signed)
HPI:  [name] is a 74 y.o. female   Here with daughter [name]  Currently in [facility]  [phone]  Going home next week      Seen 10/26/2019 at [facility]    Presented as level 2 trauma, following fall down steps    She was found to have right scalp hematoma, left parietal scalp laceration, type III odontoid fracture, left 2 and 5-6 rib fxs, small splenic laceration, and periaortic fat stranding.     Pt became hypotensive with CT scan due to periaortic fat stranding previously seen. Vascular surgery consulted and determined there was no concern for aortic injury and no need for intervention from their standpoint. Pt received 2 L of IVF bolus and 2 unit RBC. Pt then became hypertensive with systolic BP [id number] and patient received hydralazine. Pt reports having some left sided pain but improved with recent pain medication administration.     Denies hematuria or history of kidney/bladder issues.   Cr 0.77  Hematocrit 38 (33, s/p 2 unit RBC)     CT Angio abdomen and pelvis today shows stable fat stranding/hematoma near the upper abdominal aorta. Increased hematoma in the left retroperitoneum and abutting the psoas muscle." Wall thickening of the proximal to mid ureter without contrast extravasation, mild associated left hydronephrosis". Stable 3 cm hematoma adjacent to pancreatic tail, small hematoma along left lower abdominal wall laterally.     Low concern for ureteral injury, and with paraaortic stranding, the thought was this created stranding around left kidney and tracking down ureteral plane.     plan was for repeat images [place] 12/03/2019 done at nursing home was normal (reviewed)      Getting CT angio abdomen pelvis pelvis 12/28/2019  Will order CT  Urogram to be combined, coordinated with this    ------------  She still has a catheter in place. Catheter was attempted to be removed x3 times. But was not asked to void sitting, was sp trauma at the time.     Last time it was  changed 1 week ago.        with a history of CAD s/p CABG (2012) and stenting (2015, on plavix), asthma, COPD and OSA       PMH:         Past Medical History:   Diagnosis Date    Allergic rhinitis 09/30/2014    Arthritis     Asthma     CAD (coronary artery disease)     DVT (deep venous thrombosis)     post-op after CABG, on anticoagulation for ~2 years    PE (pulmonary embolism)     post-op after CABG, on anticoagulation for ~2 years    Prediabetes     Tubular adenoma 03/2015    colonoscopy        PSH:        Past Surgical History:   Procedure Laterality Date    ACHILLES TENDON SURGERY      APPENDECTOMY      CHOLECYSTECTOMY      CORONARY ANGIOPLASTY WITH STENT PLACEMENT      CORONARY ARTERY BYPASS GRAFT  2013    triple     CYST REMOVAL      both thumbs    PR REPAIR EYE BLOWOUT,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    Procedure: ORBIT ORIF;  Surgeon: Jannett Celestine, DDS;  Location: [facility] MAIN OR;  Service: OMFS    TONSILLECTOMY AND ADENOIDECTOMY  Current Outpatient Medications   Medication    acetaminophen (TYLENOL) 500 mg tablet    ibuprofen (ADVIL,MOTRIN) 600 MG tablet    oxyCODONE (ROXICODONE) 5 MG immediate release tablet    lidocaine (LIDODERM) 5 % patch    bacitracin zinc-polymyxin B sulfate (POLYSPORIN) ointment    senna (SENOKOT) 8.6 MG tablet    melatonin 3 MG    tamsulosin (FLOMAX) 0.4 MG capsule    ARTIFICIAL TEARS (LUBRIFRESH PM,GENTEAL PM) ointment    budesonide (PULMICORT) 0.25 MG/2ML nebulizer solution    budesonide-formoterol (SYMBICORT) 160-4.5 MCG/ACT inhaler    albuterol HFA (PROVENTIL, VENTOLIN, PROAIR HFA) 108 (90 Base) MCG/ACT inhaler    montelukast (SINGULAIR) 10 MG tablet    sertraline (ZOLOFT) 100 MG tablet    fluticasone-umeclidinium-vilanterol (TRELEGY ELLIPTA) 200-62.5-25 MCG/INH diskus inhaler    albuterol HFA (PROVENTIL, VENTOLIN, PROAIR HFA) 108 (90 Base) MCG/ACT inhaler    gabapentin (GABAPENTIN) 300 MG capsule    pantoprazole  (PROTONIX) 20 MG EC tablet    azelastine (ASTELIN) 0.1 % nasal spray    Spacer/Aero-Holding Chambers (EASIVENT) spacer    fluticasone (FLONASE) 50 MCG/ACT nasal spray    Non-System Medication    ipratropium-albuterol (DUONEB) 0.5-2.5mg  /11mL nebulizer solution    ipratropium (ATROVENT) 0.06 % nasal spray    Misc. Devices (CPAP) machine    atorvastatin (LIPITOR) 40 MG tablet    Cholecalciferol (VITAMIN D3) 5000 UNITS TABS    clopidogrel (PLAVIX) 75 MG tablet     No current facility-administered medications for this visit.       Allergies:         Allergies   Allergen Reactions    Morphine Hives     Has tolerated Percocet            Social History:   Social History           Socioeconomic History    Marital status: Widowed     Spouse name: Not on file    Number of children: Not on file    Years of education: Not on file    Highest education level: Not on file   Tobacco Use    Smoking status: Never Smoker    Smokeless tobacco: Never Used   Substance and Sexual Activity    Alcohol use: No    Drug use: No    Sexual activity: Not on file   Other Topics Concern    Not on file   Social History Narrative    May 2016        Lives with her husband in an in law suite at her son's house        Three children, all live locally     Five grandchildren     Retired from school food service       Family Hx:        Family History   Problem Relation Age of Onset    Other Mother         "rare lung disease"    Aneurysm Mother         abdominal aortic aneurysm    Diabetes Type II Mother     Breast cancer Mother     Blood clots Mother     Stroke Father     Stroke Brother         x2    Thyroid cancer Daughter            ROS    General: Alert but sleepy after pain  medication, appears stated age and cooperative, resting comfortably in bed  Chest: Unlabored respirations   Abdomen: Soft, non-distended, non-tender, no suprapubic tenderness, no guarding or rigidity. No ecchymosis noted  to abdomen or left flank.   GENITOURINARY: foley in place draining clear yellow urine.  Psych: Affect pleasant, makes eye contact.     PE  .vs  Ht 1.778 m (5\' 10" )    Wt 122.5 kg (270 lb)    LMP  (LMP Unknown)    BMI 38.74 kg/m   Ht 1.778 m (5\' 10" )    Wt 122.5 kg (270 lb)    LMP  (LMP Unknown)    BMI 38.74 kg/m    General appearance - alert, well appearing, and in no distress  Eyes - sclera anicteric    Chest - normal respiratory effort        Extremities - no pedal edema                 Assessment:  [name] is a 74 y.o. female with presented 11/25/2019 as level II trauma following fall down steps yesterday.     She was found to have right scalp hematoma, left parietal scalp laceration, type III odontoid fracture, left 2 and 5-6 rib fxs, small splenic laceration, and periaortic fat stranding.     CT Angio abdomen and pelvis today shows stable fat stranding/hematoma near the upper abdominal aorta. Increased hematoma in the left retroperitoneum and abutting the psoas muscle, no injury evident to kidney. Wall thickening of the proximal to mid ureter without contrast extravasation, contrast present in ureter distal to area of thickening. Stable 3 cm hematoma adjacent to pancreatic tail, small hematoma along left lower abdominal wall laterally.       Low concern for ureteral injury, and with paraaortic stranding, the thought was this created stranding around left kidney and tracking down ureteral plane.     plan was for repeat images plan was for repeat images [place] 12/03/2019 done at nursing home was normal (reviewed)    Getting CT pelvis 12/28/2019  Will order CT  Urogram to be combined, coordinated with this  Video visit after        -----------------  She still has a catheter in place. Catheter was attempted to be removed x3 times. But was not asked to void sitting, was sp trauma at the time.     Last time it was changed 1 week ago.     I explained to the patient that urinary retention may be  multifactorial in etiology, and may be related either to outlet obstruction (BPH, strictures, stones) or bladder atony related to anatomic causes (bladder diverticulum) or neurologic causes (diabetic neuropathy, cerebral infarcts, Parkinson's symptoms, spinal cord injury, etc), or any combination of these factors.     While we have medications at our disposal to treat bladder outflow obstruction from problems such as BPH, these treatments may not be fully effective in treating urinary retention if other such factors are contributing to her urinary retention.     Unfortunately we do not have efficacious medications to improve or increase the contractility of the bladder musculature,       In her case, it seems like attempts for catheter removal were at time of her being more debilitated than she is now, not attempted to viod on toilet, on narcotics, etc    Will remove foley here, and rely on nursing home to replace foley if she has not voided by tonight.  If bladder scan capabilities, bladder scan after voided and if volume >300 ml replace foley     If such medications are not efficacious or are contraindicated, the treatment of choice to ensure adequate bladder emptying is catheterization - either intermittent (preferable) or indwelling.     We talked about the option of a chronic indwelling catheter with the benefit of direct drainage from bladder. We talked about the risk of urinary tract infection and chronic colonization of urine by bacteria, as well as the risk of blockage, erosion, infection and long term risk of bladder cancer.   We talked about the need for catheter care (we will teach patient) and the need for exchanges about every month with or without antibiotic prophylaxis (1 dose) prior to catheter changes.       We discussed clean intermittent self-catheterization as an option for management of urinary retention. We discussed the need for teaching of the technique by our nurses and that after that  patient would require 3-4 times daily catheterization.   We discussed the rationale of CIC, as this has decreased incidence of infections compared to chronic catheter. We discussed the potential risk of bleeding, infection and discomfort.             Plan:    Getting CT pelvis 12/28/2019  Will order CT  Urogram to be combined, coordinated with this  Video visit after    Remove foley today here   Follow up with rehab facility, if not voied by tonight, do bladder scan if available and if volume >300 ml after voiding would need catheter again    Follow up with me after CT 12/28/2019      Margaretha Glassing, MD    All notes, labs, and images referenced in the note, were independently reviewed.

## 2019-12-13 ENCOUNTER — Other Ambulatory Visit
Admission: RE | Admit: 2019-12-13 | Discharge: 2019-12-13 | Disposition: A | Discharge status: Home / Self Care | Payer: Medicare (Managed Care) | Source: Ambulatory Visit

## 2019-12-14 LAB — COVID-19 NAAT (PCR): COVID-19 NAAT (PCR): NEGATIVE

## 2019-12-14 LAB — COVID-19 PCR

## 2019-12-15 ENCOUNTER — Ambulatory Visit: Payer: Medicare (Managed Care) | Admitting: Surgery

## 2019-12-15 ENCOUNTER — Telehealth: Payer: Self-pay | Admitting: Primary Care

## 2019-12-15 DIAGNOSIS — S0101XA Laceration without foreign body of scalp, initial encounter: Secondary | ICD-10-CM

## 2019-12-15 NOTE — Telephone Encounter (Signed)
Patient's daughter called saying  patient needs a letter from doctor for her apartment that patient is not able to walk up stairs , and she will need  a elevator. Patient moved to a senior living facility in April, but Patient gave her 30 day notice in February 2021 and her apartment won't give her security deposit without the letter from doctor .  Patient's daughter said if any questions you can contact Judy at ([phone] Please advise

## 2019-12-15 NOTE — H&P (Signed)
Plastic Surgery    SUBJECTIVE     History of present Illness:  [name] is a 74 y.o. female PMH COPD, chronic anticoagulation , DM who presents for an initial evaluation of scalp laceration. She was seen at the ED and later admitted to the hospital after traumatic fall involving stairs on 10/27/19. She suffered multiple injuries including type II odontoid fractures, rib fractures, splenic laceration, cervical injuries and large parietal laceration. She was discharged from the hospital to rehab facility on 6/29.   Today she denies any pain to scalp laceration and reports has been making progress at rehab facility. She may be discharge home next week.    Allergies:   Allergies   Allergen Reactions    Morphine Hives     Has tolerated Percocet       Past Medical/ Surgical History: Reviewed and included in the electronic medical records    Social History: Reviewed and included in the electronic medical records    Family History: Reviewed and included in the electronic medical records    OBJECTIVE     Vitals: LMP  (LMP Unknown)   General: The patient is alert and oriented x3, is well dressed and well kept, pleasant and cooperative through the evaluation.  Constitutional: Stable vital signs, hemodynamically stable, in no acute distress.    Integumentary   Exam of the SCALP: L parietal laceration healed. Surrounding skin without erythema, redness, or any signs of infection. Intact sensation.         ASSESSMENT     [name] is a 74 y.o. female PMH COPD, chronic anticoagulation , DM who presents for an initial evaluation of scalp laceration.    PLAN     The following plan was agreed  -Discussed laceration has healed as expected and no need for further follow ups     Follow up: PRN        The patient is in full understanding with the above and agrees with the plan. All questions were answered.    Patient seen and examined with Dr. Ardine Eng, NP as of 9:57 AM 12/15/2019      .

## 2019-12-16 ENCOUNTER — Telehealth: Payer: Self-pay | Admitting: Pulmonology

## 2019-12-16 NOTE — Telephone Encounter (Signed)
I called the patient's daughter and left a message letting her know that the letter is ready for pick up. The letter is in the patient pick up folder.

## 2019-12-16 NOTE — Telephone Encounter (Signed)
Letter done    [name], MD  [facility]  12/16/2019  6:52 AM

## 2019-12-16 NOTE — Telephone Encounter (Signed)
Copied from [place] [id number]. Topic: Appointments - Reschedule Appointment  >> Dec 16, 2019 10:02 AM Eddie North wrote:  Patient is calling to reschedule her appointments from 11/22/19 as she was in an accident an hospitalized. Writer rescheduled for 11/17 as soonest available. Patient is requesting a sooner appointment and writer added to wait list.  Patient can be reached at [phone])

## 2019-12-21 ENCOUNTER — Other Ambulatory Visit
Admission: RE | Admit: 2019-12-21 | Discharge: 2019-12-21 | Disposition: A | Discharge status: Home / Self Care | Payer: Medicare (Managed Care) | Source: Ambulatory Visit

## 2019-12-21 ENCOUNTER — Ambulatory Visit: Payer: Medicare (Managed Care) | Admitting: Neurosurgery

## 2019-12-21 DIAGNOSIS — S12120D Other displaced dens fracture, subsequent encounter for fracture with routine healing: Secondary | ICD-10-CM

## 2019-12-21 LAB — COVID-19 NAAT (PCR): COVID-19 NAAT (PCR): NEGATIVE

## 2019-12-21 LAB — COVID-19 PCR

## 2019-12-21 NOTE — Telephone Encounter (Signed)
2nd attempt to reach the patient's daughter. Please see the message below.

## 2019-12-21 NOTE — Progress Notes (Signed)
ROUGH DRAFT - see letter with the same date as encounter for finalized evaluation      Latoya Gulling, MD, [place] in [place], [place], [place]  [place], [place] [address]  [phone] - main  [phone] - fax    December 21, 2019    RE:  [name] [place], [birth year]    Dear [name], MD,    I had the pleasure of seeing [name] today in follow up for a type III odontoid fracture.     [name] is a 74 year old female with a PMH of CAD, aortic stenosis, CABG on plavix, OSA, and DM who presented as level 2 trauma after fall down 15 stairs on 10/25/19. She was found to have a type III odontoid fx, grade 1 splenic laceration, scalp laceration, extrapleural hematoma and multiple rib fractures. Neurosurgery was consulted for a type III odontoid fracture with extension into right lateral mass and possible injury to the anterior longitudinal ligament. She was placed in a cervical collar for management. She presents today reporting some continued right posterior neck pain. Collar remains in place.    Past Medical History:   Past Medical History:   Diagnosis Date    Allergic rhinitis 09/30/2014    Arthritis     Asthma     CAD (coronary artery disease)     DVT (deep venous thrombosis)     post-op after CABG, on anticoagulation for ~2 years    PE (pulmonary embolism)     post-op after CABG, on anticoagulation for ~2 years    Prediabetes     Tubular adenoma 03/2015    colonoscopy      Past Surgical History:   Past Surgical History:   Procedure Laterality Date    ACHILLES TENDON SURGERY      APPENDECTOMY      CHOLECYSTECTOMY      CORONARY ANGIOPLASTY WITH STENT PLACEMENT      CORONARY ARTERY BYPASS GRAFT  2013    triple     CYST REMOVAL      both thumbs    PR REPAIR EYE BLOWOUT,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    Procedure: ORBIT ORIF;  Surgeon: Jannett Celestine, DDS;  Location: [facility] MAIN OR;  Service: OMFS     TONSILLECTOMY AND ADENOIDECTOMY       Allergies:   Allergies   Allergen Reactions    Morphine Hives     Has tolerated Percocet       Medications:  Current Outpatient Medications   Medication    acetaminophen (TYLENOL) 500 mg tablet    ibuprofen (ADVIL,MOTRIN) 600 MG tablet    oxyCODONE (ROXICODONE) 5 MG immediate release tablet    lidocaine (LIDODERM) 5 % patch    bacitracin zinc-polymyxin B sulfate (POLYSPORIN) ointment    senna (SENOKOT) 8.6 MG tablet    melatonin 3 MG    tamsulosin (FLOMAX) 0.4 MG capsule    ARTIFICIAL TEARS (LUBRIFRESH PM,GENTEAL PM) ointment    budesonide (PULMICORT) 0.25 MG/2ML nebulizer solution    budesonide-formoterol (SYMBICORT) 160-4.5 MCG/ACT inhaler    albuterol HFA (PROVENTIL, VENTOLIN, PROAIR HFA) 108 (90 Base) MCG/ACT inhaler    montelukast (SINGULAIR) 10 MG tablet    sertraline (ZOLOFT) 100 MG tablet    fluticasone-umeclidinium-vilanterol (TRELEGY ELLIPTA) 200-62.5-25 MCG/INH diskus inhaler    albuterol HFA (PROVENTIL, VENTOLIN, PROAIR HFA) 108 (90 Base) MCG/ACT inhaler    gabapentin (GABAPENTIN) 300 MG capsule  pantoprazole (PROTONIX) 20 MG EC tablet    azelastine (ASTELIN) 0.1 % nasal spray    Spacer/Aero-Holding Chambers (EASIVENT) spacer    fluticasone (FLONASE) 50 MCG/ACT nasal spray    Non-System Medication    ipratropium-albuterol (DUONEB) 0.5-2.5mg  /87mL nebulizer solution    ipratropium (ATROVENT) 0.06 % nasal spray    Misc. Devices (CPAP) machine    atorvastatin (LIPITOR) 40 MG tablet    Cholecalciferol (VITAMIN D3) 5000 UNITS TABS    clopidogrel (PLAVIX) 75 MG tablet     No current facility-administered medications for this visit.     Social History:  Social History     Socioeconomic History    Marital status: Widowed     Spouse name: Not on file    Number of children: Not on file    Years of education: Not on file    Highest education level: Not on file   Tobacco Use    Smoking status: Never Smoker    Smokeless tobacco: Never Used    Substance and Sexual Activity    Alcohol use: No    Drug use: No    Sexual activity: Not on file   Other Topics Concern    Not on file   Social History Narrative    May 2016        Lives with her husband in an in law suite at her son's house        Three children, all live locally     Five grandchildren     Retired from school food service     Family History:  Family History   Problem Relation Age of Onset    Other Mother         "rare lung disease"    Aneurysm Mother         abdominal aortic aneurysm    Diabetes Type II Mother     Breast cancer Mother     Blood clots Mother     Stroke Father     Stroke Brother         x2    Thyroid cancer Daughter        Review of Symptoms:  Twelve systems have been reviewed and pertinent positives noted in HPI.  All other systems negative:  Constitutional symptoms not present  Eyes not present  Ears, Nose, Mouth, Throat not present  Cardiovascular not present  Respiratory not present  Gastrointestinal not present  Genitourinary not present  Musculoskeletal present  Neurological present  Psychiatric not present  Integumentary not present  Endocrine not present  Hematologic/Lymphatic not present  Allergic/Immunologic not present    Physical Exam:  Well-developed and in no acute distress.  Eyes:  EOMI, sclera clear  Neck:  Supple.  Non-tender. In cervical collar.   Cardiovascular:  No cyanosis, good capillary refill.  Pulmonary:  Clear, even, and easy respirations.  Skin:  Warm, dry, and pink.   Musculoskeletal:  Range of motion full.  Normal tone.  No atrophy.  No fasciculations.  Focused neurological exam was performed:    Alert and oriented to person, place and time.  Speech is clear and fluent.  Cranial nerves II-XII intact.    Strength: Full power throughout.  Sensory exam intact.  Gait is narrow-based and steady.      Imaging: MRI cervical spine on 10/28/19:  Type III odontoid fracture, with 2 mm posterior displacement of the odontoid fracture fragment, extension  into the right C2 lateral mass, and disruption of the posterior  longitudinal ligament. Injury to the anterior longitudinal ligament may also be   present. END OF IMPRESSION.    CT cervical spine on 10/25/19:  Acute mildly displaced type III odontoid fracture without narrowing of the central spinal canal. Please see additional separately dictated CT reports.  Findings discussed with covering provider Dr. Ernie Hew via phone at 9:20 PM on 10/25/2019. END OF IMPRESSION.    Impression/Plan: 74 y.o. female s/p fall on 6/14 with a type III odontoid fracture with extension into right lateral mass and possible injury to the anterior longitudinal ligament. Repeat CT cervical spine is ordered to assess for bony healing. She will be seen back once this is completed for cervical collar clearance.    Visit was 25 minutes, greater than 50% of the time was spent counseling/coordinating care.    Thank you for this opportunity to care for your patient.    Sincerely,    Tillie Rung. Marianne Sofia, MD, Erenest Rasher, Associate Professor of Neurosurgery  (NPI: [id number])    [facility], [facility], and [facility] member practice          cc:

## 2019-12-22 ENCOUNTER — Telehealth: Payer: Self-pay | Admitting: Vascular Surgery

## 2019-12-22 NOTE — Telephone Encounter (Signed)
Called patient to remind her to have labs completed prior to CT scan scheduled for 12/28/2019.  No answer on mobile phone.  Left message and provided clinic number for questions or concerns.    Luane School. [place], [place]  Vascular Surgery  12/22/2019 11:52

## 2019-12-23 ENCOUNTER — Telehealth: Payer: Self-pay

## 2019-12-23 NOTE — Telephone Encounter (Signed)
The patient called back and verified she is getting released from the Rehab facility and her discharge date is 12/23/2019

## 2019-12-23 NOTE — Telephone Encounter (Signed)
I called the patient and left a message asking them to give our office a call back. Please see the message below.

## 2019-12-23 NOTE — Telephone Encounter (Signed)
Patient name- [name]    Name of person who is [facility] name- [place] Name and Number-[phone]     Patient hospital discharged date- 12/09/2019    Does patient have transportation: yes  Was the patient diagnosed with COVID during the hospitalization or had a + COVID test in the last 10 days?   Date of COVID test?  No , 12/09/2019     Please schedule appointment with the following conditions      1) Appointment is at least 10 day s after COVID+ test       AND      2) fever has resolved (and they are off of fever reducing medication like ibuprofen or Tylenol) the entire 24 hours before appt date      AND      3) COVID symptoms are improving for the entire 24hrs before apt date     4) If patient wants appointment to be seen sooner than 10 days, please offer Zoom appointment      Appointment date with Provider :01/07/2020

## 2019-12-23 NOTE — Telephone Encounter (Signed)
3rd attempt to reach the patient. I am mailing the letter to their home.    I have sent an unable to reach letter to mychart.

## 2019-12-23 NOTE — Telephone Encounter (Signed)
Please book with me instead on Tuesday 8/24. Ok to use 1 same day slot (3min ok)    Please verify - is she getting d/c-ed from her rehab facility? What date? (she was discharged from [facility] on 7/29 to the rehab facility)    [name], MD  [place]  12/23/2019  1:03 PM

## 2019-12-23 NOTE — Telephone Encounter (Signed)
Patient called back and stated it is okay to mail the letter.

## 2019-12-23 NOTE — Telephone Encounter (Signed)
The Patient called back and scheduled her appointment

## 2019-12-24 ENCOUNTER — Other Ambulatory Visit: Payer: Self-pay | Admitting: Registered Nurse

## 2019-12-24 ENCOUNTER — Other Ambulatory Visit
Admission: RE | Admit: 2019-12-24 | Discharge: 2019-12-24 | Disposition: A | Discharge status: Home / Self Care | Payer: Medicare (Managed Care) | Source: Ambulatory Visit | Attending: Registered Nurse | Admitting: Registered Nurse

## 2019-12-24 ENCOUNTER — Encounter: Payer: Self-pay | Admitting: Gastroenterology

## 2019-12-24 DIAGNOSIS — Z01812 Encounter for preprocedural laboratory examination: Secondary | ICD-10-CM | POA: Insufficient documentation

## 2019-12-24 LAB — CREATININE, SERUM
Creatinine: 0.58 mg/dL (ref 0.51–0.95)
GFR,Black: 106 *
GFR,Caucasian: 92 *

## 2019-12-24 LAB — UREA NITROGEN/CREAT RATIO: UN/Creat Ratio: 17

## 2019-12-24 LAB — BUN: Lab: 10 mg/dL (ref 6–20)

## 2019-12-27 ENCOUNTER — Telehealth: Payer: Self-pay

## 2019-12-27 ENCOUNTER — Encounter: Payer: Self-pay | Admitting: Gastroenterology

## 2019-12-27 ENCOUNTER — Other Ambulatory Visit
Admission: RE | Admit: 2019-12-27 | Discharge: 2019-12-27 | Disposition: A | Discharge status: Home / Self Care | Payer: Medicare (Managed Care) | Source: Ambulatory Visit | Attending: Primary Care | Admitting: Primary Care

## 2019-12-27 ENCOUNTER — Encounter: Payer: Medicare (Managed Care) | Admitting: Primary Care

## 2019-12-27 ENCOUNTER — Encounter: Payer: Self-pay | Admitting: Primary Care

## 2019-12-27 DIAGNOSIS — B029 Zoster without complications: Secondary | ICD-10-CM

## 2019-12-27 DIAGNOSIS — S0101XD Laceration without foreign body of scalp, subsequent encounter: Secondary | ICD-10-CM

## 2019-12-27 DIAGNOSIS — R300 Dysuria: Secondary | ICD-10-CM | POA: Insufficient documentation

## 2019-12-27 LAB — URINE MICROSCOPIC (IQ200)
Hyaline Casts,UA: NONE SEEN /lpf (ref 0–5)
WBC,UA: >50 /hpf — AB (ref 0–5)

## 2019-12-27 LAB — URINALYSIS WITH REFLEX TO MICROSCOPIC
Glucose,UA: NEGATIVE mg/dL
Ketones, UA: NEGATIVE
Nitrite,UA: NEGATIVE
Specific Gravity,UA: 1.02 (ref 1.002–1.030)
pH,UA: 6 (ref 5.0–8.0)

## 2019-12-27 NOTE — Telephone Encounter (Signed)
Labs pending if appropriate for urine sample

## 2019-12-27 NOTE — Telephone Encounter (Addendum)
Spoke with Larene Beach. She states that she will not be back to see patient until Thursday, but patients sister should be available to take her to the lab for a sample. Left message for patient to return call. mychart sent as well

## 2019-12-27 NOTE — Telephone Encounter (Signed)
Orders are signed.  Anyway we could get pictures uploaded to her mychart, if this is shingles we would want to start anti-viral treatment sooner rather than later.  Thanks

## 2019-12-27 NOTE — Telephone Encounter (Signed)
Larene Beach from [facility] called on behalf of the patient and stated she wanted to make it aware that the patient will be receiving Skilled nursing, Occupational therapy and physical therapy. Larene Beach also stated that the patient has had a catheter in her for four weeks now and is today saying she has UTI symptoms. The patient has urgency to go and pain. Larene Beach said she also has a rash on her right side of her body that is appearing to look like shingles. Please advise [phone]

## 2019-12-28 ENCOUNTER — Encounter: Payer: Self-pay | Admitting: Primary Care

## 2019-12-28 ENCOUNTER — Other Ambulatory Visit: Payer: Medicare (Managed Care)

## 2019-12-28 ENCOUNTER — Other Ambulatory Visit: Payer: Self-pay

## 2019-12-28 DIAGNOSIS — R300 Dysuria: Secondary | ICD-10-CM

## 2019-12-28 MED ORDER — VALACYCLOVIR HCL 1000 MG PO TABS *I*
1000.0000 mg | ORAL_TABLET | Freq: Three times a day (TID) | ORAL | 0 refills | Status: DC
Start: 2019-12-28 — End: 2019-12-28
  Filled 2019-12-28: qty 21, 7d supply, fill #0

## 2019-12-28 MED ORDER — CEPHALEXIN 500 MG PO CAPS *I*
500.0000 mg | ORAL_CAPSULE | Freq: Two times a day (BID) | ORAL | 0 refills | Status: DC
Start: 2019-12-28 — End: 2020-01-04

## 2019-12-28 MED ORDER — VALACYCLOVIR HCL 1000 MG PO TABS *I*
1000.0000 mg | ORAL_TABLET | Freq: Three times a day (TID) | ORAL | 0 refills | Status: DC
Start: 2019-12-28 — End: 2020-01-04

## 2019-12-28 NOTE — Telephone Encounter (Signed)
Patient spoke with brandi via mychart.

## 2019-12-28 NOTE — Addendum Note (Signed)
Addended by: Karen Chafe on: 12/28/2019 02:20 PM     Modules accepted: Orders

## 2019-12-28 NOTE — Telephone Encounter (Signed)
Reordered for latta wegs, urine results are in but not yet reviewed

## 2019-12-28 NOTE — Telephone Encounter (Signed)
This patient attachment is clinically relevant.  Please keep in the patient's chart.    []  Document  [x]  Photo    Brief attachment description: rash on side, concern for shingles  (Ex. L forearm rash, WC papers)    Thank you,  Daine Floras, NP

## 2019-12-28 NOTE — Telephone Encounter (Signed)
Patient is calling regarding the valACYclovir HCl She stated she wants it sent to [address]. She does not know why [facility]. The patient also would like to know if her lab results are in yet. Please advise [phone].

## 2019-12-29 ENCOUNTER — Telehealth: Payer: Self-pay | Admitting: Optometry

## 2019-12-29 ENCOUNTER — Encounter: Payer: Self-pay | Admitting: Primary Care

## 2019-12-29 LAB — AEROBIC CULTURE

## 2019-12-29 NOTE — Telephone Encounter (Signed)
12/29/2019    I wanted you to be aware that the following patient has cancelled their appointment.    Provider Name: Dr. Gaspar Skeeters   Date of Appointment: 01/04/20   Patient Name: [name]   MRN: [id number]   DOB: Sep 22, 1945       Reason for Cancellation: insurance issues        Thank you,  Leta Speller

## 2019-12-30 ENCOUNTER — Encounter: Payer: Self-pay | Admitting: Gastroenterology

## 2020-01-02 ENCOUNTER — Ambulatory Visit
Admission: RE | Admit: 2020-01-02 | Discharge: 2020-01-02 | Disposition: A | Discharge status: Home / Self Care | Payer: Medicare (Managed Care) | Source: Ambulatory Visit | Admitting: Radiology

## 2020-01-02 ENCOUNTER — Telehealth: Payer: Self-pay | Admitting: Vascular Surgery

## 2020-01-02 DIAGNOSIS — G2581 Restless legs syndrome: Secondary | ICD-10-CM | POA: Insufficient documentation

## 2020-01-02 DIAGNOSIS — S36892D Contusion of other intra-abdominal organs, subsequent encounter: Secondary | ICD-10-CM | POA: Insufficient documentation

## 2020-01-02 DIAGNOSIS — S12110D Anterior displaced Type II dens fracture, subsequent encounter for fracture with routine healing: Secondary | ICD-10-CM

## 2020-01-02 DIAGNOSIS — R7303 Prediabetes: Secondary | ICD-10-CM | POA: Insufficient documentation

## 2020-01-02 DIAGNOSIS — I2699 Other pulmonary embolism without acute cor pulmonale: Principal | ICD-10-CM | POA: Insufficient documentation

## 2020-01-02 DIAGNOSIS — Z955 Presence of coronary angioplasty implant and graft: Secondary | ICD-10-CM | POA: Insufficient documentation

## 2020-01-02 DIAGNOSIS — Z86718 Personal history of other venous thrombosis and embolism: Secondary | ICD-10-CM | POA: Insufficient documentation

## 2020-01-02 DIAGNOSIS — R9431 Abnormal electrocardiogram [ECG] [EKG]: Secondary | ICD-10-CM

## 2020-01-02 DIAGNOSIS — I2510 Atherosclerotic heart disease of native coronary artery without angina pectoris: Secondary | ICD-10-CM | POA: Insufficient documentation

## 2020-01-02 DIAGNOSIS — S12120D Other displaced dens fracture, subsequent encounter for fracture with routine healing: Secondary | ICD-10-CM

## 2020-01-02 DIAGNOSIS — S299XXD Unspecified injury of thorax, subsequent encounter: Secondary | ICD-10-CM

## 2020-01-02 DIAGNOSIS — F329 Major depressive disorder, single episode, unspecified: Secondary | ICD-10-CM | POA: Insufficient documentation

## 2020-01-02 DIAGNOSIS — Z20822 Contact with and (suspected) exposure to covid-19: Secondary | ICD-10-CM | POA: Insufficient documentation

## 2020-01-02 DIAGNOSIS — Z951 Presence of aortocoronary bypass graft: Secondary | ICD-10-CM | POA: Insufficient documentation

## 2020-01-02 DIAGNOSIS — W109XXA Fall (on) (from) unspecified stairs and steps, initial encounter: Secondary | ICD-10-CM

## 2020-01-02 DIAGNOSIS — G4733 Obstructive sleep apnea (adult) (pediatric): Secondary | ICD-10-CM | POA: Insufficient documentation

## 2020-01-02 DIAGNOSIS — I440 Atrioventricular block, first degree: Secondary | ICD-10-CM

## 2020-01-02 DIAGNOSIS — B029 Zoster without complications: Secondary | ICD-10-CM | POA: Insufficient documentation

## 2020-01-02 DIAGNOSIS — J45909 Unspecified asthma, uncomplicated: Secondary | ICD-10-CM | POA: Insufficient documentation

## 2020-01-02 DIAGNOSIS — N390 Urinary tract infection, site not specified: Secondary | ICD-10-CM | POA: Insufficient documentation

## 2020-01-02 DIAGNOSIS — Z86711 Personal history of pulmonary embolism: Secondary | ICD-10-CM | POA: Insufficient documentation

## 2020-01-02 DIAGNOSIS — Z7902 Long term (current) use of antithrombotics/antiplatelets: Secondary | ICD-10-CM | POA: Insufficient documentation

## 2020-01-02 MED ORDER — IOHEXOL 350 MG/ML (OMNIPAQUE) IV SOLN *I*
1.0000 mL | Freq: Once | INTRAVENOUS | Status: DC
Start: 2020-01-02 — End: 2020-01-03

## 2020-01-02 NOTE — ED Triage Notes (Addendum)
Pt had outpatient CT for follow up from a previous fall in June and found an incidental right sided PE. Pt c/o mild DOE and lower right back pain. Pt is on Plavix. Hx of PE. EKG in triage.       Triage Note   Ulysees Barns, RN

## 2020-01-02 NOTE — Telephone Encounter (Signed)
Called by radiology about finding on CTA chest of pulmonary emboli.    I called pt, she reports some SOB.    I recommended ED for further eval and mgmt.

## 2020-01-03 ENCOUNTER — Observation Stay
Admission: EM | Admit: 2020-01-03 | Discharge: 2020-01-04 | Disposition: A | Discharge status: Home / Self Care | Payer: Medicare (Managed Care) | Source: Ambulatory Visit | Attending: Internal Medicine | Admitting: Internal Medicine

## 2020-01-03 ENCOUNTER — Encounter: Payer: Self-pay | Admitting: Student in an Organized Health Care Education/Training Program

## 2020-01-03 ENCOUNTER — Telehealth: Payer: Self-pay

## 2020-01-03 DIAGNOSIS — I2699 Other pulmonary embolism without acute cor pulmonale: Secondary | ICD-10-CM

## 2020-01-03 DIAGNOSIS — R9431 Abnormal electrocardiogram [ECG] [EKG]: Secondary | ICD-10-CM

## 2020-01-03 DIAGNOSIS — I2510 Atherosclerotic heart disease of native coronary artery without angina pectoris: Secondary | ICD-10-CM

## 2020-01-03 DIAGNOSIS — Z789 Other specified health status: Secondary | ICD-10-CM

## 2020-01-03 DIAGNOSIS — I440 Atrioventricular block, first degree: Secondary | ICD-10-CM

## 2020-01-03 LAB — CBC AND DIFFERENTIAL
Baso # K/uL: 0.1 10*3/uL (ref 0.0–0.1)
Basophil %: 0.8 %
Eos # K/uL: 0.3 10*3/uL (ref 0.0–0.4)
Eosinophil %: 2.6 %
Hematocrit: 40 % (ref 34–45)
Hemoglobin: 12.7 g/dL (ref 11.2–15.7)
IMM Granulocytes #: 0.1 10*3/uL — ABNORMAL HIGH (ref 0.0–0.0)
IMM Granulocytes: 0.5 %
Lymph # K/uL: 2.9 10*3/uL (ref 1.2–3.7)
Lymphocyte %: 25.2 %
MCH: 29 pg (ref 26–32)
MCHC: 32 g/dL (ref 32–36)
MCV: 92 fL (ref 79–95)
Mono # K/uL: 0.9 10*3/uL (ref 0.2–0.9)
Monocyte %: 7.8 %
Neut # K/uL: 7.2 10*3/uL — ABNORMAL HIGH (ref 1.6–6.1)
Nucl RBC # K/uL: 0 10*3/uL (ref 0.0–0.0)
Nucl RBC %: 0 /100 WBC (ref 0.0–0.2)
Platelets: 331 10*3/uL (ref 160–370)
RBC: 4.4 MIL/uL (ref 3.9–5.2)
RDW: 13.9 % (ref 11.7–14.4)
Seg Neut %: 63.1 %
WBC: 11.5 10*3/uL — ABNORMAL HIGH (ref 4.0–10.0)

## 2020-01-03 LAB — BASIC METABOLIC PANEL
Anion Gap: 13 (ref 7–16)
CO2: 23 mmol/L (ref 20–28)
Calcium: 9.6 mg/dL (ref 8.6–10.2)
Chloride: 102 mmol/L (ref 96–108)
Creatinine: 0.62 mg/dL (ref 0.51–0.95)
GFR,Black: 103 *
GFR,Caucasian: 90 *
Glucose: 109 mg/dL — ABNORMAL HIGH (ref 60–99)
Lab: 15 mg/dL (ref 6–20)
Potassium: 4.5 mmol/L (ref 3.3–5.1)
Sodium: 138 mmol/L (ref 133–145)

## 2020-01-03 LAB — PERFORMING LAB

## 2020-01-03 LAB — NT-PRO BNP: NT-pro BNP: 64 pg/mL (ref 0–900)

## 2020-01-03 LAB — HOLD BLUE

## 2020-01-03 LAB — TROPONIN T 0 HR HIGH SENSITIVITY (IP/ED ONLY): TROP T 0 HR High Sensitivity: 13 ng/L (ref 0–13)

## 2020-01-03 LAB — EKG 12-LEAD
P: 17 deg
PR: 226 ms
QRS: 53 deg
QRSD: 95 ms
QT: 343 ms
QTc: 430 ms
Rate: 94 {beats}/min
T: 79 deg

## 2020-01-03 LAB — PROTIME-INR
INR: 1.2 — ABNORMAL HIGH (ref 0.9–1.1)
Protime: 13.1 s — ABNORMAL HIGH (ref 10.0–12.9)

## 2020-01-03 LAB — TROPONIN T 3 HR W/ DELTA HIGH SENSITIVITY (IP/ED ONLY)
HS TROP % Change: 23 % — ABNORMAL HIGH (ref 0–20)
TROP T 0-3 HR DELTA High Sensitivity: 3 (ref 0–7)
TROP T 3 HR High Sensitivity: 16 ng/L — ABNORMAL HIGH (ref 0–13)

## 2020-01-03 LAB — APTT: aPTT: 35.6 s (ref 25.8–37.9)

## 2020-01-03 LAB — COVID-19 NAAT (PCR): COVID-19 NAAT (PCR): NEGATIVE

## 2020-01-03 LAB — COVID-19 PCR

## 2020-01-03 MED ORDER — APIXABAN 5 MG PO TABS *I*
10.0000 mg | ORAL_TABLET | Freq: Two times a day (BID) | ORAL | Status: DC
Start: 2020-01-03 — End: 2020-01-04
  Administered 2020-01-03 – 2020-01-04 (×2): 10 mg via ORAL
  Filled 2020-01-03 (×3): qty 2

## 2020-01-03 MED ORDER — MELATONIN 3 MG PO TABS *I*
3.0000 mg | ORAL_TABLET | Freq: Every evening | ORAL | Status: DC | PRN
Start: 2020-01-03 — End: 2020-01-04

## 2020-01-03 MED ORDER — SENNOSIDES 8.6 MG PO TABS *I*
2.0000 | ORAL_TABLET | Freq: Every day | ORAL | Status: DC
Start: 2020-01-03 — End: 2020-01-04
  Administered 2020-01-03 – 2020-01-04 (×2): 2 via ORAL
  Filled 2020-01-03 (×2): qty 2

## 2020-01-03 MED ORDER — BUDESONIDE-FORMOTEROL FUMARATE 160-4.5 MCG/ACT IN AERO *I*
2.0000 | INHALATION_SPRAY | Freq: Two times a day (BID) | RESPIRATORY_TRACT | Status: DC
Start: 2020-01-03 — End: 2020-01-04
  Administered 2020-01-03 – 2020-01-04 (×3): 2 via RESPIRATORY_TRACT
  Filled 2020-01-03: qty 6

## 2020-01-03 MED ORDER — IPRATROPIUM-ALBUTEROL 0.5-2.5 MG/3ML IN SOLN *I*
3.0000 mL | Freq: Four times a day (QID) | RESPIRATORY_TRACT | Status: DC
Start: 2020-01-03 — End: 2020-01-04
  Administered 2020-01-03 – 2020-01-04 (×4): 3 mL via RESPIRATORY_TRACT
  Filled 2020-01-03 (×3): qty 3

## 2020-01-03 MED ORDER — ARTIFICIAL TEARS OP OINT *WRAPPED* *I*
TOPICAL_OINTMENT | Freq: Four times a day (QID) | OPHTHALMIC | Status: DC
Start: 2020-01-03 — End: 2020-01-04
  Filled 2020-01-03: qty 1

## 2020-01-03 MED ORDER — CHOLECALCIFEROL 1000 UNIT PO CAPS *WRAPPED*
5000.0000 [IU] | ORAL_CAPSULE | Freq: Every morning | ORAL | Status: DC
Start: 2020-01-03 — End: 2020-01-04
  Administered 2020-01-03 – 2020-01-04 (×2): 5000 [IU] via ORAL
  Filled 2020-01-03 (×2): qty 5

## 2020-01-03 MED ORDER — TAMSULOSIN HCL 0.4 MG PO CAPS *I*
0.4000 mg | ORAL_CAPSULE | Freq: Every evening | ORAL | Status: DC
Start: 2020-01-03 — End: 2020-01-04
  Administered 2020-01-03: 0.4 mg via ORAL
  Filled 2020-01-03: qty 1

## 2020-01-03 MED ORDER — FLUTICASONE PROPIONATE 50 MCG/ACT NA SUSP *I*
2.0000 | Freq: Every day | NASAL | Status: DC
Start: 2020-01-03 — End: 2020-01-04
  Administered 2020-01-03 – 2020-01-04 (×2): 2 via NASAL
  Filled 2020-01-03: qty 16

## 2020-01-03 MED ORDER — ENOXAPARIN SODIUM 100 MG/ML IJ SOSY *I*
1.0000 mg/kg | PREFILLED_SYRINGE | Freq: Once | INTRAMUSCULAR | Status: AC
Start: 2020-01-03 — End: 2020-01-03
  Administered 2020-01-03: 100 mg via SUBCUTANEOUS
  Filled 2020-01-03: qty 1

## 2020-01-03 MED ORDER — ACETAMINOPHEN 325 MG PO TABS *I*
650.0000 mg | ORAL_TABLET | Freq: Four times a day (QID) | ORAL | Status: DC | PRN
Start: 2020-01-03 — End: 2020-01-04
  Administered 2020-01-03 – 2020-01-04 (×4): 650 mg via ORAL
  Filled 2020-01-03 (×4): qty 2

## 2020-01-03 MED ORDER — ATORVASTATIN CALCIUM 40 MG PO TABS *I*
40.0000 mg | ORAL_TABLET | Freq: Every day | ORAL | Status: DC
Start: 2020-01-03 — End: 2020-01-04
  Administered 2020-01-03: 40 mg via ORAL
  Filled 2020-01-03: qty 1

## 2020-01-03 MED ORDER — PANTOPRAZOLE SODIUM 20 MG PO TBEC *I*
20.0000 mg | DELAYED_RELEASE_TABLET | Freq: Every morning | ORAL | Status: DC
Start: 2020-01-03 — End: 2020-01-04
  Administered 2020-01-03 – 2020-01-04 (×2): 20 mg via ORAL
  Filled 2020-01-03 (×2): qty 1

## 2020-01-03 MED ORDER — VALACYCLOVIR HCL 500 MG PO TABS *I*
1000.0000 mg | ORAL_TABLET | Freq: Three times a day (TID) | ORAL | Status: DC
Start: 2020-01-03 — End: 2020-01-04
  Administered 2020-01-03 – 2020-01-04 (×4): 1000 mg via ORAL
  Filled 2020-01-03 (×6): qty 2

## 2020-01-03 MED ORDER — ASPIRIN 81 MG PO CHEW *I*
81.0000 mg | CHEWABLE_TABLET | Freq: Every day | ORAL | Status: DC
Start: 2020-01-03 — End: 2020-01-04
  Administered 2020-01-03 – 2020-01-04 (×2): 81 mg via ORAL
  Filled 2020-01-03 (×2): qty 1

## 2020-01-03 MED ORDER — MONTELUKAST SODIUM 10 MG PO TABS *I*
10.0000 mg | ORAL_TABLET | Freq: Every evening | ORAL | Status: DC
Start: 2020-01-03 — End: 2020-01-04
  Administered 2020-01-03: 10 mg via ORAL
  Filled 2020-01-03 (×2): qty 1

## 2020-01-03 MED ORDER — ACETAMINOPHEN 500 MG PO TABS *I*
1000.0000 mg | ORAL_TABLET | Freq: Once | ORAL | Status: AC
Start: 2020-01-03 — End: 2020-01-03
  Administered 2020-01-03: 1000 mg via ORAL
  Filled 2020-01-03: qty 2

## 2020-01-03 MED ORDER — CEPHALEXIN 500 MG PO CAPS *I*
500.0000 mg | ORAL_CAPSULE | Freq: Two times a day (BID) | ORAL | Status: DC
Start: 2020-01-03 — End: 2020-01-04
  Administered 2020-01-03 – 2020-01-04 (×3): 500 mg via ORAL
  Filled 2020-01-03 (×3): qty 1

## 2020-01-03 MED ORDER — ALBUTEROL SULFATE (2.5 MG/3ML) 0.083% IN NEBU *I*
2.5000 mg | INHALATION_SOLUTION | RESPIRATORY_TRACT | Status: DC | PRN
Start: 2020-01-03 — End: 2020-01-04
  Administered 2020-01-03: 2.5 mg via RESPIRATORY_TRACT
  Filled 2020-01-03: qty 3

## 2020-01-03 NOTE — ED Notes (Signed)
Assumed care of patient, appears in no acute distress.  Patient is resting comfortably at this time.  Will continue to monitor and treat per orders.

## 2020-01-03 NOTE — ED Provider Notes (Addendum)
History     Chief Complaint   Patient presents with    Abnormal Imaging     Patient is 74 year old female with past medical history significant for DVT, PE, on Plavix who presents with PE on outside imaging.  Patient states that she had a fall several months back, and was in the ICU for this, and ever since then she has had increasing shortness of breath.  She denies chest pain, dizziness.  She states that she was getting her CT scan done today when she was found to have blood clot in her lungs, and told to come in.  She would not be present in the ED if not for this.  Denies dizziness.      History provided by:  Patient  Language interpreter used: No        Medical/Surgical/Family History     Past Medical History:   Diagnosis Date    Allergic rhinitis 09/30/2014    Arthritis     Asthma     CAD (coronary artery disease)     DVT (deep venous thrombosis)     post-op after CABG, on anticoagulation for ~2 years    PE (pulmonary embolism)     post-op after CABG, on anticoagulation for ~2 years    Prediabetes     Tubular adenoma 03/2015    colonoscopy         Patient Active Problem List   Diagnosis Code    Severe Obstructive sleep apnea G47.33    Pulmonary nodule R91.1    CAD (coronary artery disease) I25.10    Prediabetes R73.03    Gastritis K29.70    Obesity E66.9    Dysthymia F34.1    Facial fracture due to fall S02.92XA, [id number]    Orbital floor fracture S02.30XA    Restless leg syndrome G25.81    Health care maintenance Z00.00    Aortic stenosis I35.0    Anxiety and depression F41.9, F32.9    COPD (chronic obstructive pulmonary disease) J44.9    Osteopenia M85.80    Environmental allergies Z91.09    Closed odontoid fracture with type III morphology and routine healing, subsequent encounter S12.120D    Endometrial thickening on CT R93.89    Carotid stenosis, bilateral I65.23            Past Surgical History:   Procedure Laterality Date    ACHILLES TENDON SURGERY      APPENDECTOMY       CHOLECYSTECTOMY      CORONARY ANGIOPLASTY WITH STENT PLACEMENT      CORONARY ARTERY BYPASS GRAFT  2013    triple     CYST REMOVAL      both thumbs    PR REPAIR EYE BLOWOUT,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    Procedure: ORBIT ORIF;  Surgeon: Jannett Celestine, DDS;  Location: [facility] MAIN OR;  Service: OMFS    TONSILLECTOMY AND ADENOIDECTOMY       Family History   Problem Relation Age of Onset    Other Mother         "rare lung disease"    Aneurysm Mother         abdominal aortic aneurysm    Diabetes Type II Mother     Breast cancer Mother     Blood clots Mother     Stroke Father     Stroke Brother         x2    Thyroid cancer Daughter  Social History     Tobacco Use    Smoking status: Never Smoker    Smokeless tobacco: Never Used   Substance Use Topics    Alcohol use: No    Drug use: No     Living Situation     Questions Responses    Patient lives with Spouse    Homeless No    Caregiver for other family member No    External Services None    Employment Retired    Curator Violence Risk                 Review of Systems   Review of Systems   Constitutional: Negative for fever.   HENT: Negative for hearing loss.    Eyes: Negative for visual disturbance.   Respiratory: Positive for shortness of breath.    Cardiovascular: Negative for chest pain.   Gastrointestinal: Negative for abdominal pain.   Endocrine: Negative for polyuria.   Genitourinary: Negative for dysuria.   Musculoskeletal: Negative for myalgias.   Neurological: Negative for dizziness.   Psychiatric/Behavioral: Negative for agitation.       Physical Exam     Triage Vitals  Triage Start: Start, (01/02/20 1944)   First Recorded BP: 143/73, Resp: 16, Temp: 35.6 C (96.1 F), Temp src: TEMPORAL Oxygen Therapy SpO2: 95 %, Oximetry Source: Rt Hand, O2 Device: None (Room air), Heart Rate: 89, (01/02/20 1947)  .  First Pain Reported  0-10 Scale: 5, Pain Location/Orientation: Back, (01/02/20 1947)       Physical Exam  Vitals and nursing note  reviewed.   Constitutional:       General: She is not in acute distress.  HENT:      Head: Normocephalic and atraumatic.   Eyes:      Extraocular Movements: Extraocular movements intact.   Cardiovascular:      Rate and Rhythm: Normal rate and regular rhythm.      Heart sounds: Normal heart sounds.   Pulmonary:      Effort: Pulmonary effort is normal.      Breath sounds: Normal breath sounds.   Abdominal:      Palpations: Abdomen is soft.      Tenderness: There is no abdominal tenderness.   Musculoskeletal:         General: Normal range of motion.      Cervical back: Normal range of motion.   Skin:     General: Skin is warm.   Neurological:      General: No focal deficit present.      Mental Status: She is alert.   Psychiatric:         Mood and Affect: Mood normal.         Medical Decision Making   Patient seen by me on:  01/03/2020    Assessment:  Patient is 74 year old female with past medical history significant for DVT, PE, on Plavix who presents with PE on outside imaging.  Patient is currently hemodynamically stable without signs of tachycardia or desaturation.  Patient has PE on outside imaging, and this may be causing her symptomatic shortness of breath.  She does have history of PE, and is currently on Plavix.    Differential diagnosis:  PE.    Plan:  Orders Placed This Encounter      CBC and differential      Basic metabolic panel      NT-pro BNP      Troponin T 0 HR High Sensitivity  Troponin T 3 HR W/ Delta High Sensitivity      EKG: initial      EKG: follow up     We will reevaluate.           ED Course as of Jan 03 740   Mon Jan 03, 2020   0236 TROP T 0 HR High Sensitivity: 13   0236 NT-pro BNP: 64   0741 Pt signed out to oncoming team          Marek Olene Craven, MD      Resident Attestation:    Patient seen by me on 01/03/2020.    I saw and evaluated the patient. I agree with the resident's/fellow's findings and plan of care as documented above. Details of my evaluation are as  follows:    Additional attestation comments:  Patient with new PE. Provoked in the setting of recent trauma. Considering her recent splenic laceration, age, and higher risk of falls, I would prefer for her not to get double anticoagulated with Plavix and a DOAC. I think it is reasonable to start Lovenox, observe her for bleeding, and discuss stopping her Plavix with cardiology. This is not easily accomplished in the ED and she will likely need a brief admission to accomplish this. .    Author:  Loma Messing, MD       Zara Council, MD  Resident  01/03/20 [phone]       Doyle Tegethoff, Gaynelle Arabian, MD  01/05/20 [phone]

## 2020-01-03 NOTE — Telephone Encounter (Signed)
Safe Transitions Review of Chart done today.  Risk Score %: 59   Patient is admitted to:   [facility]  EOU-80      Being followed by:   Emergency Department Observation Unit   Cardiology  Diagnosis:    Pulmonary Embolism  Est. Discharge Date:   To Be Determined   I will continue to review patient's progress until discharge.  If at discharge PCP feels it is warranted, we can discuss Care Management needs at that time.   Time spent on chart review: 15 minutes    Encarnacion Chu, RN, [place]   [facility]

## 2020-01-03 NOTE — Progress Notes (Addendum)
Writer reviewed pt chart and aware that pt is currently observation status. Pt is currently active with [facility] and receives SN PT  skilled home care services at home. If pt becomes inpatient status Mike Norton RN ACC rechoiced back to [facility]. Will continue to follow    Sanya Kobrin RN, [facility]  [facility]  [phone]  Weekends/Holidays [phone]

## 2020-01-03 NOTE — ED Notes (Signed)
Report Given To  Jarrett Soho, RN      Descriptive Sentence / Reason for Admission   Pt presents to the ED with c/o abnormal imaging. Pt states that she had a CT scan today for follow-up post fall in June. Pt states she was called and told she had a PE and told to come to hospital. Pt endorses DOE, and lower R back pain.       Active Issues / Relevant Events   -PE?  -Ambulatory  -[place] J collar for post fall  -A&O  -      To Do List  -Trop @ 0430  -Vital signs q 4 hr  -Assessment        Anticipatory Guidance / Discharge Planning  -Per ED workup

## 2020-01-03 NOTE — ED Obs Notes (Signed)
ED OBSERVATION ADMISSION NOTE    Patient seen by me today, 01/03/2020 at 10:20 AM    Current patient status: Observation    History     Chief Complaint   Patient presents with    Abnormal Imaging     HPI     The patient is a 74 year old female with a past medical history of CAD status post CABG, moderate to severe aortic stenosis, obstructive sleep apnea on CPAP, asthma, history of DVT and PE and a recent hospitalization for a fall who was advised to go to the ED for evaluation of abnormal imaging which included CTA showing PE.    The patient is complaining dyspnea which he reports started since her recent fall which resulted in found to have a type 3 odontoid fx, grade 1 splenic laceration, scalp laceration, extrapleural hematoma and multiple rib fxs.  She denies any chest pain, palpitations, lower extremity edema, no extremity tenderness.  She is hemodynamically stable and afebrile, heart rate is in the 70s.  Labs are generally unremarkable WBC count 11.5, 0-hour troponin 13, creatinine 0.6, BUN 15, INR 1.2, APTT 35.    Past Medical History:   Diagnosis Date    Allergic rhinitis 09/30/2014    Arthritis     Asthma     CAD (coronary artery disease)     DVT (deep venous thrombosis)     post-op after CABG, on anticoagulation for ~2 years    PE (pulmonary embolism)     post-op after CABG, on anticoagulation for ~2 years    Prediabetes     Tubular adenoma 03/2015    colonoscopy        Past Surgical History:   Procedure Laterality Date    ACHILLES TENDON SURGERY      APPENDECTOMY      CHOLECYSTECTOMY      CORONARY ANGIOPLASTY WITH STENT PLACEMENT      CORONARY ARTERY BYPASS GRAFT  2013    triple     CYST REMOVAL      both thumbs    PR REPAIR EYE BLOWOUT,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    Procedure: ORBIT ORIF;  Surgeon: Jannett Celestine, DDS;  Location: [facility] MAIN OR;  Service: OMFS    TONSILLECTOMY AND ADENOIDECTOMY         Family History   Problem Relation Age of Onset    Other Mother         "rare lung  disease"    Aneurysm Mother         abdominal aortic aneurysm    Diabetes Type II Mother     Breast cancer Mother     Blood clots Mother     Stroke Father     Stroke Brother         x2    Thyroid cancer Daughter        Social History      reports that she has never smoked. She has never used smokeless tobacco. She reports that she does not drink alcohol and does not use drugs. No history on file for sexual activity.    Living Situation     Questions Responses    Patient lives with Spouse    Homeless No    Caregiver for other family member No    External Services None    Employment Retired    Domestic Violence Risk           Review of Systems   Review of Systems   Constitutional: Negative.  HENT: Negative.    Eyes: Negative.    Respiratory: Positive for shortness of breath.    Cardiovascular: Negative.    Gastrointestinal: Negative.    Endocrine: Negative.    Genitourinary: Negative.    Musculoskeletal: Negative.    Skin: Negative.    Allergic/Immunologic: Negative.    Neurological: Negative.    Hematological: Negative.    Psychiatric/Behavioral: Negative.        Physical Exam   BP 152/79 (BP Location: Right arm)    Pulse 82    Temp 36.2 C (97.2 F) (Temporal)    Resp 14    Ht 1.702 m (5\' 7" )    Wt 109.8 kg (242 lb)    LMP  (LMP Unknown)    SpO2 96%    BMI 37.90 kg/m     Physical Exam  Vitals and nursing note reviewed.   Constitutional:       General: She is not in acute distress.  HENT:      Head: Normocephalic and atraumatic.      Mouth/Throat:      Mouth: Mucous membranes are moist.   Eyes:      Conjunctiva/sclera: Conjunctivae normal.   Neck:      Comments: Cervical collar in place  Cardiovascular:      Rate and Rhythm: Normal rate and regular rhythm.      Pulses: Normal pulses.   Pulmonary:      Effort: Pulmonary effort is normal.      Breath sounds: Normal breath sounds. No wheezing.   Abdominal:      General: Bowel sounds are normal. There is no distension.      Palpations: Abdomen is soft.       Tenderness: There is no abdominal tenderness.   Musculoskeletal:         General: No swelling or tenderness.      Cervical back: Neck supple.   Skin:     General: Skin is warm and dry.      Comments: Rash in a dermatomal fashion in the right lumbar region   Neurological:      General: No focal deficit present.      Mental Status: She is alert and oriented to person, place, and time.   Psychiatric:         Mood and Affect: Mood normal.         Behavior: Behavior normal.         Tests    XBM:WUXLKG EKG, normal sinus rhythm, unchanged from previous tracings    Labs:   All labs in the last 24 hours:   Recent Results (from the past 24 hour(s))   EKG: initial    Collection Time: 01/02/20  7:53 PM   Result Value Ref Range    Rate 94 bpm    PR 226 ms    P 17 deg    QRSD 95 ms    QT 343 ms    QTc 430 ms    QRS 53 deg    T 79 deg   CBC and differential    Collection Time: 01/03/20  1:34 AM   Result Value Ref Range    WBC 11.5 (H) 4.0 - 10.0 THOU/uL    RBC 4.4 3.90 - 5.20 MIL/uL    Hemoglobin 12.7 11.2 - 15.7 g/dL    Hematocrit 40 34.00 - 45.00 %    MCV 92 79.0 - 95.0 fL    MCH 29 26 - 32 pg  MCHC 32 32 - 36 g/dL    RDW 13.9 11.7 - 14.4 %    Platelets 331 160 - 370 THOU/uL    Seg Neut % 63.1 %    Lymphocyte % 25.2 %    Monocyte % 7.8 %    Eosinophil % 2.6 %    Basophil % 0.8 %    Neut # K/uL 7.2 (H) 1.6 - 6.1 THOU/uL    Lymph # K/uL 2.9 1.2 - 3.7 THOU/uL    Mono # K/uL 0.9 0.2 - 0.9 THOU/uL    Eos # K/uL 0.3 0.0 - 0.4 THOU/uL    Baso # K/uL 0.1 0.0 - 0.1 THOU/uL    Nucl RBC % 0.0 0.0 - 0.2 /100 WBC    Nucl RBC # K/uL 0.0 0.0 - 0.0 THOU/uL    IMM Granulocytes # 0.1 (H) 0.0 - 0.0 THOU/uL    IMM Granulocytes 0.5 %   Basic metabolic panel    Collection Time: 01/03/20  1:34 AM   Result Value Ref Range    Glucose 109 (H) 60 - 99 mg/dL    Sodium 138 133 - 145 mmol/L    Potassium 4.5 3.3 - 5.1 mmol/L    Chloride 102 96 - 108 mmol/L    CO2 23 20 - 28 mmol/L    Anion Gap 13 7 - 16    UN 15 6 - 20 mg/dL    Creatinine 0.62 0.51 - 0.95  mg/dL    GFR,Caucasian 90 *    GFR,Black 103 *    Calcium 9.6 8.6 - 10.2 mg/dL   NT-pro BNP    Collection Time: 01/03/20  1:34 AM   Result Value Ref Range    NT-pro BNP 64 0 - 900 pg/mL   Troponin T 0 HR High Sensitivity    Collection Time: 01/03/20  1:34 AM   Result Value Ref Range    TROP T 0 HR High Sensitivity 13 0 - 13 ng/L   Hold blue    Collection Time: 01/03/20  1:44 AM   Result Value Ref Range    Hold Blue HOLD TUBE    Troponin T 3 HR W/ Delta High Sensitivity    Collection Time: 01/03/20  4:40 AM   Result Value Ref Range    TROP T 3 HR High Sensitivity 16 (H) 0 - 13 ng/L    TROP T 0-3 HR DELTA High Sensitivity 3 0 - 7    HS TROP % Change 23 (H) 0 - 20 %   APTT    Collection Time: 01/03/20 12:34 PM   Result Value Ref Range    aPTT 35.6 25.8 - 37.9 sec   Protime-INR    Collection Time: 01/03/20 12:34 PM   Result Value Ref Range    Protime 13.1 (H) 10.0 - 12.9 sec    INR 1.2 (H) 0.9 - 1.1        Imaging: EKG: initial    Result Date: 01/03/2020  Sinus rhythm Prolonged PR interval Borderline T abnormalities, diffuse leads    CT ANGIO CHEST    Result Date: 01/02/2020  Incidentally visualized embolus in the proximal branch of the right upper lobe segmental pulmonary artery resulting in mild distention of the right main pulmonary artery without evidence of right heart strain. The thoracic aorta is normal in course and caliber with mild atherosclerotic calcification and unchanged soft plaque in the descending thoracic aorta near the arch. Previously noted presumed hematoma along the descending thoracic  aorta at the level of the diaphragm is no longer appreciated on this study. Stable 6 mm left lower lobe pulmonary nodule. Please see separately dictated abdominal/pelvis CT report for more detailed findings below the diaphragm. Findings discussed with Dr. Arsenio Loader over the phone at approximately 6:50 PM on 01/02/2020. END OF IMPRESSION I have personally reviewed the images and the Resident's/Fellow's interpretation and  agree with or edited the findings. [facility] submits this DICOM format image data and final report to the [facility], an independent secure electronic health information exchange, on a reciprocally searchable basis (with patient authorization) for a minimum of 12 months after exam date.    CT angio abdomen and pelvis    Result Date: 01/03/2020  Significant interval improvement/near complete resolution of previously identified periaortic fat stranding/hematoma. No acute findings in the abdomen or pelvis. Focal ectasia of the infrarenal abdominal aorta measures 2.2 cm. Mildly ectatic right common iliac artery. Please see separate CT chest report for supradiaphragmatic findings. END OF IMPRESSION       Medical Decision Making      Amount and/or Complexity of Data Reviewed  Clinical lab tests: ordered and reviewed  Tests in the radiology section of CPT: ordered and reviewed  Tests in the medicine section of CPT: reviewed and ordered  Review and summarize past medical records: yes  Discuss the patient with other providers: yes  Independent visualization of images, tracings, or specimens: yes        Assessment:    74 y.o., female placed in OBS after evaluation in the ED for Evaluation of incidentally visualized embolus in the proximal branch of the right upper lobe segmental pulmonary artery resulting in mild distention of the right main pulmonary artery.    Differential Diagnosis includes PE, Fat embolism,                     Plan:     Incidentally found PE:  -Patient started on Eliquis  -TTE ordered  -Patient seen by cardiology which recommended discontinuing home Plavix, starting baby aspirin along with an anticoagulant for treatment of pulmonary embolism    History of asthma:  -Continue home inhalers including albuterol, Symbicort, ipratropium, Symbicort GERD:  -Continue home Protonix    CAD:  -Continue home aspirin, statin, beta-blocker    Depression:  -Continue home sertraline    UTI:  -Continue home  Keflex    Shingles:  -Continue home Valtrex      Medically preferred DVT prophylaxis: Apixaban  Smoking Cessation: NA  Code Status: Full code  Disposition Barriers: None  Covid-19 Status: Pending    Bevelyn Ngo, MBBS     Bevelyn Ngo, MBBS  01/03/20 [phone]

## 2020-01-03 NOTE — ED Notes (Signed)
Patient ambulated with walker to bathroom, gait steady.

## 2020-01-03 NOTE — ED Notes (Addendum)
Assumed care and assessed ATT. Resting comfortably in NAD. Pt ambulatory independently w/ walker. [place] J collar in place. No skin breakdown noted. Denies SOB at rest, dizziness, CP. Endorsing DOE. Reviewed POC, call bell within reach.

## 2020-01-03 NOTE — Plan of Care (Signed)
Problem: Impaired Transfers  Goal: STG - IMPROVE TRANSFERS  Outcome: Adequate for discharge  Note: Patient will complete Sit to stand transfers using a rolling walker with Modified independence     Time frame: 1 Visit     Problem: Impaired Ambulation  Goal: STG - IMPROVE AMBULATION  Outcome: Adequate for discharge  Note: Patient will ambulate less than 50 feet using a rolling walker with Modified independence    Time frame: 1 Visit       Problem: Impaired Ambulation  Goal: LTG - IMPROVE AMBULATION  Note: Patient will ambulate 100-149 feet using least restrictive assistive device with Modified independence    Time frame: 7-10 days

## 2020-01-03 NOTE — ED Notes (Signed)
Assessment complete, Pt denies SOB at rest. Lung sounds clear. Pt does have shingle out break on right side, localized. Pt currently denies pain and is wearing neck brace currently. Pt updated on plan of care. Call bell and bedside table within reach. Bed in lowest position. Will continue to monitor per orders.

## 2020-01-03 NOTE — ED Notes (Signed)
Plan of Care     VS, safety and comfort measures, low Na diet, fall prec, miami J collar, tele, walker w/ ambulation

## 2020-01-03 NOTE — Progress Notes (Signed)
Physical Therapy Initial Evaluation    Discharge Recommendations:  [name] demonstrates adequate mobility with bed mobility, transfers and ambulation to return to her prior living arrangement using rolling walker. PT is not a barrier to discharge when patient is medically ready for discharge from the hospital.  Recommend patient maintain mobility with nursing to facilitate discharge and prevent deconditioning while patient remains hospitalized.   PT Discharge Recommendations: Anticipate return to prior living arrangement, Home PT after discharge from the hospital. PT Discharge Equipment Recommended: None    History of Present Admission: 74 year old woman with a history of CAD s/p CABG (2013), moderate to severe aortic stenosis, OSA using CPAP, asthma, history of pulmonary embolism, and recent admission for trauma following fall down 15 stairs w/ type 3 odontoid fxwho presents to [facility] with pulmonary embolism and shortness of breath.    Past Medical History:   Diagnosis Date    Allergic rhinitis 09/30/2014    Arthritis     Asthma     CAD (coronary artery disease)     DVT (deep venous thrombosis)     post-op after CABG, on anticoagulation for ~2 years    PE (pulmonary embolism)     post-op after CABG, on anticoagulation for ~2 years    Prediabetes     Tubular adenoma 03/2015    colonoscopy         Past Surgical History:   Procedure Laterality Date    ACHILLES TENDON SURGERY      APPENDECTOMY      CHOLECYSTECTOMY      CORONARY ANGIOPLASTY WITH STENT PLACEMENT      CORONARY ARTERY BYPASS GRAFT  2013    triple     CYST REMOVAL      both thumbs    PR REPAIR EYE BLOWOUT,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    Procedure: ORBIT ORIF;  Surgeon: Jannett Celestine, DDS;  [facility] MAIN OR;  Service: OMFS    TONSILLECTOMY AND ADENOIDECTOMY         Personal factors affecting treatment/recovery:   Advanced age (>=65 yo)   History of falls   Lives alone    Comorbidities affecting  treatment/recovery:  type 3 odontoid fx    Clinical presentation:   stable    Patient complexity:     low level as indicated by above stability of condition, personal factors, environmental factors and comorbidities in addition to their impairments found on physical exam.       01/03/20 1425   Prior Living    Prior Living Situation Reported by patient   Lives With [place] Help From Independent   Type of Home Apartment;Independent Living   # Steps to [place] 0  (elevator)   # Of Steps In Home 0   Location of Bedrooms 1st floor   Location of Bathrooms 1st floor   Medical Equipment in Home Rollator walker   Prior Function Level   Prior Function Level Reported by patient   Transfers Independent   Walking Independent;Used assistive device   Walking assistive devices used Rollator walker   PT Tracking   PT TRACKING PT Assigned   Visit Number   Visit Number [facility]) / Treatment Day (HH) 1   Visit Details [facility])   Visit Type [facility]) Evaluation   Precautions/Observations   Precautions used Yes   Brace Applied Yes   Brace Type Neck brace   Isolation Precautions None   Was patient wearing a mask? Yes   PPE worn by Probation officer Gloves;Goggles;Mask  Fall Precautions General falls precautions   Pain Assessment   *Is the patient currently in pain? Denies   Cognition   Arousal/Alertness Appropriate responses to stimuli   Following Commands Follows all commands and directions without difficulty   LUE Strength   Overall Strength WFL able to perform ADL tasks with strength   RUE Strength   Overall Strength WFL able to perform ADL tasks with strength   LE Assessment   LE Assessment Full AROM RLE;Full AROM LLE   Strength LLE   Overall Strength WFL able to perform ADL tasks with strength   Strength RLE   Overall Strength WFL able to perform ADL tasks with strength   Bed Mobility   Bed mobility Tested   Rolling Independent   Supine to Sit Independent;Side rails down;Head of bed flat   Sit to Supine Independent;Side rails down;Head of  bed flat   Additional comments patient struggles to move but is able to complete withiout physical assist   Transfers   Transfers Tested   Stand Pivot Transfers Modified  independent   Sit to Stand Modified independent (device)   Stand to sit Modified independent (device)   Transfer Assistive Device rolling walker   Additional comments no loss of balance or gait instability observed   Mobility   Mobility: Gait/Stairs Tested   Gait Pattern Decreased cadence   Ambulation Assist Modified independent (device)   Ambulation Distance (Feet) 44ft x 2   Ambulation Assistive Device rolling walker   Additional comments no loss of balance or gait instability observed   Balance   Balance Tested   Sitting - Static Independent ;Unsupported   Sitting - Dynamic Independent;Supported  (UE support)   Standing - Static Independent;Supported  (walker)   Standing - Dynamic Independent;Supported  (walker)   Functional Outcome Measures   Functional Outcome Measures Yes   PT AM-PAC Mobility   Turning over in bed? 3   Sitting down on and standing up from a chair with arms? 1   Moving from lying on back to sitting on the side of the bed? 3   Moving to and from a bed to a chair? 4   Need to walk in hospital room? 4   Climbing 3 - 5 steps with a railing? 1   Total Raw Score 16   Standardized Score - Calculated 40.78   % Functional Impairment - Calculated 54%   Assessment   Brief Assessment Patient demonstrates adequate mobility skills to return home   Patient / Family Goal to return home   Plan/Recommendation   PT Treatment Interventions Bed mobility training;Transfers training;Gait training;Pt/Family education;D/C planning   PT Frequency 1-2 x/wk   PT Mobility Recommendations OOB and ambulate with rolling walker   PT Referral Recommendations Home care   PT Discharge Recommendations Anticipate return to prior living arrangement;Home PT   PT Discharge Equipment Recommended None   PT Assessment/Recommendations Reviewed With:  Patient;Physician;Care coordinator   Next PT Visit if patient remains hospitalized will follow doe gait progression   PT needs to see patient prior to DC  No   Time Calculation   Total Time Therapeutic Activities (minutes) 0   Total Time Gait Training (minutes) 0   Total Time Therapeutic Exercises (minutes) 0   Total Time Neuromuscular Re-education (minutes) 0   Total Time Group Therapy (minutes) 0   PT Timed Codes 0   PT Untimed Codes 17   PT Unbilled Time 0   PT Total Treatment 17   PT Total  Treatment 17   Plan and Onset date   Plan of Care Date 01/03/20   Onset Date 01/03/20   Treatment Start Date 01/03/20   Pandora Leiter, PT,DPT

## 2020-01-03 NOTE — ED Notes (Signed)
Report Given To  Jenny Reichmann, RN ED OBS      Descriptive Sentence / Reason for Admission   Pt presents to the ED with c/o abnormal imaging. Pt states that she had a CT scan today for follow-up post fall in June. Pt states she was called and told she had a PE and told to come to hospital. Pt endorses DOE, and lower R back pain.       Active Issues / Relevant Events   -PE?  -Ambulatory  -[place] collar for post fall  -A&Ox4      To Do List  -Vital signs q 4 hr  -Assessment  -Meds per [facility]        Anticipatory Guidance / Discharge Planning  -OBS for PE

## 2020-01-03 NOTE — ED Notes (Signed)
Anticoagulation Therapy Education      Patient teaching regarding anticoagulation therapy was performed.      The patient verbalized understanding of:  · the purpose of anticoagulation therapy and importance of compliance   · the need for laboratory monitoring and scheduled tests  · potential diet interactions with anticoagulation therapy  · common signs/symptoms of bleeding and actions  to take as a result    · the potential for drug interactions with anticoagulation therapy and actions to take as a result

## 2020-01-03 NOTE — ED Notes (Signed)
Patient arrived to unit via stretcher. Patient ambulated to bed independently with walker . Pt is Alert/Oriented x4. Pt oriented to unit and call bell. Pt made aware of observation level of care.

## 2020-01-03 NOTE — Provider Consult (Signed)
Cardiology Consult Note    Date of Admission: 01/03/2020    Name: [name] Attending: Dorris Carnes,*   DOB: 1946-01-18 PCP: [name], MD   MRN: [id number] Primary Cardiologist: Dr. Trilby Leaver   CSN: [id number] [place] Date:  01/03/2020     History of Present Illness     Consult question: Anticoagulationanti and antiplatelet strategy following new diagnosis of pulmonary embolism      [name] is a 74 year old woman with a history of CAD s/p CABG (2013), moderate to severe aortic stenosis, OSA using CPAP, asthma, history of pulmonary embolism, and recent admission for trauma following fall down 15 stairs who presents to [facility] with pulmonary embolism and shortness of breath. The patient denies chest pains, lightheadedness, or history of syncope. She has been on Plavix for many years now. In the past she was on aspirin and Plavix but at some point it was reduced to just Plavix.    Past Medical and Surgical History     Past Medical History:   Diagnosis Date    Allergic rhinitis 09/30/2014    Arthritis     Asthma     CAD (coronary artery disease)     DVT (deep venous thrombosis)     post-op after CABG, on anticoagulation for ~2 years    PE (pulmonary embolism)     post-op after CABG, on anticoagulation for ~2 years    Prediabetes     Tubular adenoma 03/2015    colonoscopy      Past Surgical History:   Procedure Laterality Date    ACHILLES TENDON SURGERY      APPENDECTOMY      CHOLECYSTECTOMY      CORONARY ANGIOPLASTY WITH STENT PLACEMENT      CORONARY ARTERY BYPASS GRAFT  2013    triple     CYST REMOVAL      both thumbs    PR REPAIR EYE BLOWOUT,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    Procedure: ORBIT ORIF;  Surgeon: Jannett Celestine, DDS;  Location: [facility] MAIN OR;  Service: OMFS    TONSILLECTOMY AND ADENOIDECTOMY         Medications and Allergies   (Not in a hospital admission)    She is allergic to morphine.    Family and Social History     Family History   Problem Relation  Age of Onset    Other Mother         "rare lung disease"    Aneurysm Mother         abdominal aortic aneurysm    Diabetes Type II Mother     Breast cancer Mother     Blood clots Mother     Stroke Father     Stroke Brother         x2    Thyroid cancer Daughter      Social History     Socioeconomic History    Marital status: Widowed     Spouse name: Not on file    Number of children: Not on file    Years of education: Not on file    Highest education level: Not on file   Occupational History    Not on file   Tobacco Use    Smoking status: Never Smoker    Smokeless tobacco: Never Used   Substance and Sexual Activity    Alcohol use: No    Drug use: No    Sexual activity: Not on file   Social History Narrative  May 2016        Lives with her husband in an in law suite at her son's house        Three children, all live locally     Five grandchildren     Retired from school food service         Review of Systems     A 10+ review of systems was completed and was negative except as described in HPI    Vitals and Physical Exam     VS:  BP 125/86 (BP Location: Right arm)    Pulse 80    Temp 36.2 C (97.2 F) (Temporal)    Resp 13    Ht 1.702 m (5\' 7" )    Wt 109.8 kg (242 lb)    LMP  (LMP Unknown)    SpO2 96%    BMI 37.90 kg/m    Gen:  NAD, lying in bed  HENT:  C-collar on. Unable to visualize JVP  Resp:  No accessory muscle use, CTA bilaterally  CV:  Distant heart sounds. RRR. I/VI systolic murmur at the cardiac base. No rubs or gallops appreciated. No lower extremity edema edema  GI:  NABS, abdomen soft, non-tender, non-distended, no palpable masses  Msk:  No cyanosis, no clubbing  Skin:  No erythema, no ulcerations  Neuro:  AA&Ox3    Laboratory Data     Recent Labs   Lab 01/03/20  0134   WBC 11.5*   Hemoglobin 12.7   Hematocrit 40   Platelets 331     No components found with this basename: NEUTOPHILPCT, MONOPCT,  EOSPCT    Recent Labs   Lab 01/03/20  0134   Sodium 138   Potassium 4.5   Chloride 102   CO2  23   UN 15   Creatinine 0.62   Glucose 109*     No results for input(s): INR, PTT in the last 168 hours.    No components found with this basename: APTT  Results in Past 730 Days  Result Component Current Result Previous Result   Cholesterol 132 (08/11/2019) 151 (07/24/2018)   HDL 36 (L) (08/11/2019) 38 (L) (07/24/2018)   Triglycerides 140 (08/11/2019) 197 (!) (07/24/2018)   LDL Calculated 68 (08/11/2019) 74 (07/24/2018)     Results in Past 730 Days  Result Component Current Result Previous Result   NT-pro BNP 64 (01/03/2020) Not in Time Range         Cardiac/Imaging Data     ECG: NSR, nonspecific T wave abnormalitiesdiffuse leads         ECHO COMPLETE 10/26/2019    Interpretation Summary   Normaal wall motion. The estimated ejection fraction is 70%.   Moderate to severe aortic valve stenosis. The peak gradient across the valve is 48 mm Hg. The mean gradient is 29 mm Hg.   Mildly dilated left atrium           CENTRAL LINE PLACEMENT     Narrative  Procedure Report      CENTRAL LINE INSERTION NOTE    Time out documentation completed in Procedure navigator prior to procedure:  Yes    *See separate Pulmonary Artery Catheter Insertion Procedure Note if also placed*    Indications  Hemodynamic Monitoring, Administration of Medication and Blood Draws    Procedure Details  1% lidocaine S.Q. 3 mL total    Vein initially entered with:  18G needle  Technique:  ultrasound and with wire guide  Correct placement confirmed by:  ultrasound  Ectopy: no  Vein cannulated with:  Size: 73F  Length(cm): 20  Lumen: Triple  Coating: Sulfadiazine/Chlorhexidine  Insertion depth: 20 cm  Secured by: suturing and bioocclusive dressing    Successful placement: Yes  Venous Site: femoral  Attempted Sites: Right femoral  Guide Wire Removed Intact : yes    Complications  none    Condition  Pt tolerated procedure well. Yes  Patients condition at end of procedure: no change from prior    Plan/Orders  Chest X-Ray: not indicated    EBL  5 mL    Disposition   BTICU    Latoya Mayhew, MD  10/26/2019  3:56 AM            Impression and Plan     74 year old woman with a history of CAD s/p CABG (2013), moderate to severe aortic stenosis, OSA using CPAP, asthma, history of pulmonary embolism, and recent admission for trauma following fall down 15 stairs who presents to [facility] with pulmonary embolism and shortness of breath.    The patient has a newly diagnosed pulmonary embolism in the setting of antiplatelet therapy for coronary artery disease. To reduce her bleed risk I would transition her to ASA 81 mg daily and start anticoagulation as appropriate for treatment of pulmonary embolism. Would order echocardiogram in the setting of PE diagnosis. I reviewed her last echocardiogram. She has aortic stenosis, though it appears closer to moderate. I do not suspect her aortic stenosis is contributing to her shortness of breath.            [name]. Celine Mans, MD 8:22 AM 01/03/2020  Cardiovascular Disease

## 2020-01-03 NOTE — ED Notes (Signed)
Pt presents to the ED with c/o abnormal imaging. Pt states that she had a CT scan today for follow-up post fall in June. Pt states she was called and told she had a PE and told to come to hospital. Pt endorses DOE, and lower R back pain. Pt denies any CP. Pt is in gown, on monitor, and call bell is within reach.

## 2020-01-03 NOTE — Continuity of Care (Signed)
Writer's contact with patient was via face-to-face PPE used: mask, gloves, and eye protection.   Was patient masked during visit: yes.    Acute Care Coordinator: Discharge Planning    Chart reviewed and discharge planning information as below:    Address/Phone/PCP: verified with patient at bedside  Patient lives alone  Equipment:  Patient has a walker  Home Care: patient agreed to home care services and chose   [place]    Referral given to Gladstone Lighter RN  Plan:   Home plan with [place]  SN, PT  D/C pending medical stability    Writer will continue to follow.   Please call for questions related to discharge planning.     Chanetta Marshall RN, BSN  Acute Care Coordinator [place]  Phone: [phone] Pager [phone]  [facility] Coordinator Pager: [phone]

## 2020-01-03 NOTE — ED Notes (Signed)
Plan of Care     Meds per [facility]  VSq4   [place]   Comfort measures

## 2020-01-04 ENCOUNTER — Encounter: Payer: Self-pay | Admitting: Primary Care

## 2020-01-04 ENCOUNTER — Observation Stay: Payer: Medicare (Managed Care) | Admitting: Primary Care

## 2020-01-04 ENCOUNTER — Ambulatory Visit: Payer: Medicare (Managed Care) | Admitting: Optometry

## 2020-01-04 ENCOUNTER — Ambulatory Visit: Payer: Medicare (Managed Care) | Admitting: Urology

## 2020-01-04 ENCOUNTER — Observation Stay: Payer: Medicare (Managed Care)

## 2020-01-04 VITALS — BP 148/84 | HR 94 | Temp 96.7°F | Ht 67.01 in | Wt 244.4 lb

## 2020-01-04 DIAGNOSIS — R2242 Localized swelling, mass and lump, left lower limb: Secondary | ICD-10-CM

## 2020-01-04 DIAGNOSIS — I2699 Other pulmonary embolism without acute cor pulmonale: Secondary | ICD-10-CM

## 2020-01-04 DIAGNOSIS — B029 Zoster without complications: Secondary | ICD-10-CM

## 2020-01-04 DIAGNOSIS — I6523 Occlusion and stenosis of bilateral carotid arteries: Secondary | ICD-10-CM

## 2020-01-04 DIAGNOSIS — S12120D Other displaced dens fracture, subsequent encounter for fracture with routine healing: Secondary | ICD-10-CM

## 2020-01-04 DIAGNOSIS — R9389 Abnormal findings on diagnostic imaging of other specified body structures: Secondary | ICD-10-CM

## 2020-01-04 LAB — CBC AND DIFFERENTIAL
Baso # K/uL: 0.1 10*3/uL (ref 0.0–0.1)
Basophil %: 0.8 %
Eos # K/uL: 0.2 10*3/uL (ref 0.0–0.4)
Eosinophil %: 2.1 %
Hematocrit: 39 % (ref 34–45)
Hemoglobin: 12.2 g/dL (ref 11.2–15.7)
IMM Granulocytes #: 0.1 10*3/uL — ABNORMAL HIGH (ref 0.0–0.0)
IMM Granulocytes: 0.6 %
Lymph # K/uL: 2.3 10*3/uL (ref 1.2–3.7)
Lymphocyte %: 23 %
MCH: 29 pg (ref 26–32)
MCHC: 31 g/dL — ABNORMAL LOW (ref 32–36)
MCV: 93 fL (ref 79–95)
Mono # K/uL: 0.8 10*3/uL (ref 0.2–0.9)
Monocyte %: 8.2 %
Neut # K/uL: 6.5 10*3/uL — ABNORMAL HIGH (ref 1.6–6.1)
Nucl RBC # K/uL: 0 10*3/uL (ref 0.0–0.0)
Nucl RBC %: 0 /100 WBC (ref 0.0–0.2)
Platelets: 307 10*3/uL (ref 160–370)
RBC: 4.2 MIL/uL (ref 3.9–5.2)
RDW: 13.9 % (ref 11.7–14.4)
Seg Neut %: 65.3 %
WBC: 10 10*3/uL (ref 4.0–10.0)

## 2020-01-04 LAB — ECHO COMPLETE
AV Area (LV SV Mtd): 0.99 cm2
AV Area (LV SV) BSA Index: 0.43 cm2/m2
AV Area (LVOT SR Mtd): 0.81 cm2
AV Area (LVOT SV Mtd): 0.88 cm2
AV CWD VTI: 70.8 cm
AV CWD Velocity (Mean): 279 cm/s
AV CWD Velocity (Peak): 359 cm/s
AV Gradient (mean): 31.1 mmHg
AV Gradient (peak): 48 mmHg
Aortic Diameter (mid tubular): 4.1 cm
Aortic Diameter (sinus of Valsalva): 3.5 cm
BMI: 38 kg/m2
BP Diastolic: 67 mmHg
BP Systolic: 149 mmHg
BSA: 2.28 m2
Deceleration Time - MV: 227 ms
E/A ratio: 0.48
Echo RV Stroke Work Index Estimate: 1166.7 mmHg•mL/m2
Heart Rate: 91 {beats}/min
Height: 67.008 in
IVC Diameter: 1.9 cm
LA Diameter BSA Index: 2.2 cm/m2
LA Diameter Height Index: 2.9 cm/m
LA Diameter: 5 cm
LA Systolic Vol BSA Index: 25.9 mL/m2
LA Systolic Vol Height Index: 34.7 mL/m
LA Systolic Volume: 59 mL
LV ASE Mass BSA Index: 114.8 gm/m2
LV ASE Mass Height 2.7 Index: 62.3 gm/m2.7
LV ASE Mass Height Index: 153.8 gm/m
LV ASE Mass: 261.8 gm
LV CO BSA Index: 2.79 L/min/m2
LV Cardiac Output: 6.37 L/min
LV Diastolic Volume Index: 43.4 mL/m2
LV Posterior Wall Thickness: 1.3 cm
LV SV - LVOT SV Diff: 10.75 %
LV SV - LVOT SV Diff: 7.53 mL
LV SV BSA Index: 30.7 mL/m2
LV SV Height Index: 41.1 mL/m
LV Septal Thickness: 1.3 cm
LV Stroke Volume: 70 mL
LV Systolic Volume Index: 12.7 mL/m2
LV wall/cavity ratio: 0.52
LVED Diameter BSA Index: 2.19 cm/m2
LVED Diameter Height Index: 2.94 cm/m
LVED Diameter: 5 cm
LVED Volume BSA Index: 43 ml/m2
LVED Volume BSA Index: 43.4 mL/m2
LVED Volume Height Index: 58.2 mL/m
LVED Volume: 99 mL
LVEF (Volume): 71 %
LVES Diameter BSA Index: 0.92 cm/m2
LVES Diameter Height Index: 1.23 cm/m
LVES Diameter: 2.1 cm
LVES Volume BSA Index: 12.7 mL/m2
LVES Volume BSA Index: 13 ml/m2
LVES Volume Height Index: 17 mL/m
LVES Volume: 29 mL
LVOT + AV Gradient (mean): 33 mmHg
LVOT + AV Gradient (peak): 51.6 mmHg
LVOT Area (calculated): 3.08 cm2
LVOT Cardiac Index: 2.49 L/min/m2
LVOT Cardiac Output: 5.69 L/min
LVOT Diameter: 1.98 cm
LVOT PWD VTI: 20.3 cm
LVOT PWD Velocity (mean): 69 cm/s
LVOT PWD Velocity (peak): 94 cm/s
LVOT SV BSA Index: 27.4 mL/m2
LVOT SV Height Index: 36.7 mL/m
LVOT Stroke Rate (mean): 212.3 mL/s
LVOT Stroke Rate (peak): 289.3 mL/s
LVOT Stroke Volume: 62.47 cc
LVOT/AV Velocity Ratio: 0.26
MR Regurgitant Fraction (LV SV Mtd): 0.11
MR Regurgitant Volume (LV SV Mtd): 7.5 mL
MV Peak A Velocity: 88.4 cm/s
MV Peak E Velocity: 42.5 cm/s
Mitral Annular E/Ea Vel Ratio: 6.56
Mitral Annular Ea Velocity: 6.48 cm/s
Peak Gradient - TR: 38 mmHg
Peak Velocity - TR: 290.22 cm/s
Pulmonary Vascular Resistance Estimate: 6 mmHg
RA Pressure Estimate: 8 mmHg
RR Interval: 659.34 ms
RV Peak Systolic Pressure: 46 mmHg
Weight (lbs): 242 [lb_av]
Weight: 3872 oz

## 2020-01-04 LAB — BASIC METABOLIC PANEL
Anion Gap: 13 (ref 7–16)
CO2: 23 mmol/L (ref 20–28)
Calcium: 9.6 mg/dL (ref 8.6–10.2)
Chloride: 101 mmol/L (ref 96–108)
Creatinine: 0.64 mg/dL (ref 0.51–0.95)
GFR,Black: 102 *
GFR,Caucasian: 89 *
Glucose: 116 mg/dL — ABNORMAL HIGH (ref 60–99)
Lab: 12 mg/dL (ref 6–20)
Potassium: 3.9 mmol/L (ref 3.3–5.1)
Sodium: 137 mmol/L (ref 133–145)

## 2020-01-04 MED ORDER — GABAPENTIN 300 MG PO CAPSULE *I*
300.0000 mg | ORAL_CAPSULE | Freq: Three times a day (TID) | ORAL | Status: DC
Start: 2020-01-04 — End: 2020-01-04
  Administered 2020-01-04: 300 mg via ORAL
  Filled 2020-01-04: qty 1

## 2020-01-04 MED ORDER — APIXABAN 5 MG PO TABS *I*
ORAL_TABLET | ORAL | 0 refills | Status: DC
Start: 2020-01-04 — End: 2020-02-15

## 2020-01-04 MED ORDER — ASPIRIN 81 MG PO TBEC *I*
81.0000 mg | DELAYED_RELEASE_TABLET | Freq: Every day | ORAL | 0 refills | Status: AC
Start: 2020-01-04 — End: 2020-02-03

## 2020-01-04 MED ORDER — ASPIRIN 81 MG PO CHEW *I*
81.0000 mg | CHEWABLE_TABLET | Freq: Every day | ORAL | 0 refills | Status: AC
Start: 2020-01-05 — End: 2020-02-04

## 2020-01-04 MED ORDER — PERFLUTREN PROTEIN A MICROSPH (OPTISON) IV SUSP *I*
1.5000 mL | INTRAVENOUS | Status: DC | PRN
Start: 2020-01-04 — End: 2020-01-04
  Administered 2020-01-04: 1.5 mL via INTRAVENOUS

## 2020-01-04 NOTE — Progress Notes (Signed)
01/04/20 0900   UM Patient Class Review   Patient Class Review Observation   Patient Class Effective 01/03/20    Elder Negus RN  Utilization Management Department  [facility]  Pager [phone]

## 2020-01-04 NOTE — Progress Notes (Signed)
[place] Coodinator aware of pt discharge from [place] at [facility]. [place] to alert home visiting staff of pt observation stay at  [facility] and request staff to schedule home visit for follow up.    Clemens Catholic RN, [place]  [facility] [phone]

## 2020-01-04 NOTE — ED Obs Notes (Signed)
ED OBSERVATION DISCHARGE NOTE    Patient seen by me today, 01/04/2020 at 11:58 AM.    Current patient status: Observation    Subjective: Satting well on RA. Denies dyspnea, chest pain or any other complaints    Observation Stay Includes:  74 y.o.female who presented to the ED with   Chief Complaint   Patient presents with    Abnormal Imaging       Last Nursing documented pain:  0-10 Scale: 5 (01/04/20 1100)      Vitals:    Patient Vitals for the past 24 hrs:   BP Temp Temp src Pulse Resp SpO2 Height Weight   01/04/20 0405 149/67 36.6 C (97.8 F) Oral 92 16 94 % 1.702 m (5' 7.01") 109.8 kg (242 lb)   01/03/20 2342 137/79 36.4 C (97.5 F) TEMPORAL 79 16 96 %     01/03/20 2036 138/72 35.9 C (96.6 F) TEMPORAL 86 16 94 %     01/03/20 1602 144/68 35.8 C (96.4 F) TEMPORAL 78 16 95 %     01/03/20 1223 170/74 35.9 C (96.6 F) TEMPORAL 70 16 96 %           Physical Exam:  Physical Exam     Vitals and nursing note reviewed.   Constitutional:       General: She is not in acute distress.  HENT:      Head: Normocephalic and atraumatic.      Mouth/Throat:      Mouth: Mucous membranes are moist.   Eyes:      Conjunctiva/sclera: Conjunctivae normal.   Neck:      Comments: Cervical collar in place  Cardiovascular:      Rate and Rhythm: Normal rate and regular rhythm.      Pulses: Normal pulses.   Pulmonary:      Effort: Pulmonary effort is normal.      Breath sounds: Normal breath sounds. No wheezing.   Abdominal:      General: Bowel sounds are normal. There is no distension.      Palpations: Abdomen is soft.      Tenderness: There is no abdominal tenderness.   Musculoskeletal:         General: No swelling or tenderness.      Cervical back: Neck supple.   Skin:     General: Skin is warm and dry.      Comments: Rash in a dermatomal fashion in the right lumbar region   Neurological:      General: No focal deficit present.      Mental Status: She is alert and oriented to person, place, and time.   Psychiatric:          Mood and Affect: Mood normal.         Behavior: Behavior normal.    EKG: normal EKG, normal sinus rhythm, unchanged from previous tracings  Labs:   All labs in the last 72 hours:  Recent Results (from the past 72 hour(s))   EKG: initial    Collection Time: 01/02/20  7:53 PM   Result Value Ref Range    Rate 94 bpm    PR 226 ms    P 17 deg    QRSD 95 ms    QT 343 ms    QTc 430 ms    QRS 53 deg    T 79 deg   CBC and differential    Collection Time: 01/03/20  1:34 AM   Result Value  Ref Range    WBC 11.5 (H) 4.0 - 10.0 THOU/uL    RBC 4.4 3.90 - 5.20 MIL/uL    Hemoglobin 12.7 11.2 - 15.7 g/dL    Hematocrit 40 34.00 - 45.00 %    MCV 92 79.0 - 95.0 fL    MCH 29 26 - 32 pg    MCHC 32 32 - 36 g/dL    RDW 13.9 11.7 - 14.4 %    Platelets 331 160 - 370 THOU/uL    Seg Neut % 63.1 %    Lymphocyte % 25.2 %    Monocyte % 7.8 %    Eosinophil % 2.6 %    Basophil % 0.8 %    Neut # K/uL 7.2 (H) 1.6 - 6.1 THOU/uL    Lymph # K/uL 2.9 1.2 - 3.7 THOU/uL    Mono # K/uL 0.9 0.2 - 0.9 THOU/uL    Eos # K/uL 0.3 0.0 - 0.4 THOU/uL    Baso # K/uL 0.1 0.0 - 0.1 THOU/uL    Nucl RBC % 0.0 0.0 - 0.2 /100 WBC    Nucl RBC # K/uL 0.0 0.0 - 0.0 THOU/uL    IMM Granulocytes # 0.1 (H) 0.0 - 0.0 THOU/uL    IMM Granulocytes 0.5 %   Basic metabolic panel    Collection Time: 01/03/20  1:34 AM   Result Value Ref Range    Glucose 109 (H) 60 - 99 mg/dL    Sodium 138 133 - 145 mmol/L    Potassium 4.5 3.3 - 5.1 mmol/L    Chloride 102 96 - 108 mmol/L    CO2 23 20 - 28 mmol/L    Anion Gap 13 7 - 16    UN 15 6 - 20 mg/dL    Creatinine 0.62 0.51 - 0.95 mg/dL    GFR,Caucasian 90 *    GFR,Black 103 *    Calcium 9.6 8.6 - 10.2 mg/dL   NT-pro BNP    Collection Time: 01/03/20  1:34 AM   Result Value Ref Range    NT-pro BNP 64 0 - 900 pg/mL   Troponin T 0 HR High Sensitivity    Collection Time: 01/03/20  1:34 AM   Result Value Ref Range    TROP T 0 HR High Sensitivity 13 0 - 13 ng/L   Hold blue    Collection Time: 01/03/20  1:44 AM   Result Value Ref Range    Hold Blue HOLD  TUBE    Troponin T 3 HR W/ Delta High Sensitivity    Collection Time: 01/03/20  4:40 AM   Result Value Ref Range    TROP T 3 HR High Sensitivity 16 (H) 0 - 13 ng/L    TROP T 0-3 HR DELTA High Sensitivity 3 0 - 7    HS TROP % Change 23 (H) 0 - 20 %   APTT    Collection Time: 01/03/20 12:34 PM   Result Value Ref Range    aPTT 35.6 25.8 - 37.9 sec   Protime-INR    Collection Time: 01/03/20 12:34 PM   Result Value Ref Range    Protime 13.1 (H) 10.0 - 12.9 sec    INR 1.2 (H) 0.9 - 1.1   COVID-19 PCR    Collection Time: 01/03/20  3:46 PM   Result Value Ref Range    COVID-19 Source Nasopharyngeal     COVID-19 PCR NEG NEG   Performing Lab    Collection Time: 01/03/20  3:46 PM   Result Value Ref Range    Performing Lab see below    Basic metabolic panel    Collection Time: 01/04/20  4:10 AM   Result Value Ref Range    Glucose 116 (H) 60 - 99 mg/dL    Sodium 137 133 - 145 mmol/L    Potassium 3.9 3.3 - 5.1 mmol/L    Chloride 101 96 - 108 mmol/L    CO2 23 20 - 28 mmol/L    Anion Gap 13 7 - 16    UN 12 6 - 20 mg/dL    Creatinine 0.64 0.51 - 0.95 mg/dL    GFR,Caucasian 89 *    GFR,Black 102 *    Calcium 9.6 8.6 - 10.2 mg/dL   CBC and differential    Collection Time: 01/04/20  4:10 AM   Result Value Ref Range    WBC 10.0 4.0 - 10.0 THOU/uL    RBC 4.2 3.90 - 5.20 MIL/uL    Hemoglobin 12.2 11.2 - 15.7 g/dL    Hematocrit 39 34.00 - 45.00 %    MCV 93 79.0 - 95.0 fL    MCH 29 26 - 32 pg    MCHC 31 (L) 32 - 36 g/dL    RDW 13.9 11.7 - 14.4 %    Platelets 307 160 - 370 THOU/uL    Seg Neut % 65.3 %    Lymphocyte % 23.0 %    Monocyte % 8.2 %    Eosinophil % 2.1 %    Basophil % 0.8 %    Neut # K/uL 6.5 (H) 1.6 - 6.1 THOU/uL    Lymph # K/uL 2.3 1.2 - 3.7 THOU/uL    Mono # K/uL 0.8 0.2 - 0.9 THOU/uL    Eos # K/uL 0.2 0.0 - 0.4 THOU/uL    Baso # K/uL 0.1 0.0 - 0.1 THOU/uL    Nucl RBC % 0.0 0.0 - 0.2 /100 WBC    Nucl RBC # K/uL 0.0 0.0 - 0.0 THOU/uL    IMM Granulocytes # 0.1 (H) 0.0 - 0.0 THOU/uL    IMM Granulocytes 0.6 %   Echo Complete     Collection Time: 01/04/20 10:23 AM   Result Value Ref Range    BSA 2.28 m2    Height 67.008 in    BMI 38 kg/m2    Weight 3,872 oz    Weight (lbs) 242.00 lbs    LV Septal Thickness 1.30 cm    LVED Diameter 5.00 cm    LVES Diameter 2.1 cm    LVOT Diameter 1.98 cm    LV Posterior Wall Thickness 1.30 cm    LVED Volume 99.0 mL    LVES Volume 29.0 mL    LVOT PWD Velocity (peak) 94.0 cm/s    LVOT PWD Velocity (mean) 69.0 cm/s    LVOT PWD VTI 20.30 cm    LA Diameter 5.0 cm    LVOT + AV Gradient (mean) 33 mmHg    AV CWD Velocity (Peak) 359 cm/s    AV CWD Velocity (Mean) 279 cm/s    AV CWD VTI 70.8 cm    MV Peak A Velocity 88.4 cm/s    Deceleration Time - MV 227 ms    MV Peak E Velocity 42.5 cm/s    Mitral Annular Ea Velocity 6.48 cm/s    Peak Gradient - TR 38 mmHg    Peak Velocity - TR 290.22 cm/s    Aortic Diameter (mid tubular) 4.1  cm    Aortic Diameter (sinus of Valsalva) 3.5 cm    Heart Rate 91 bpm    RV Peak Systolic Pressure 76.2 mmHg    IVC Diameter 1.9 cm    LVED Diameter BSA Index 2.19 cm/m2    LVED Diameter Height Index 2.94 cm/m    LVES Diameter BSA Index 0.92 cm/m2    LVES Diameter Height Index 1.23 cm/m    LVED Volume BSA Index 43.4 mL/m2    LVED Volume Height Index 58.2 mL/m    LVES Volume BSA Index 12.7 mL/m2    LVES Volume Height Index 17.0 mL/m    LVEF (Volume) 71 %    LV Stroke Volume 70.0 mL    LV SV BSA Index 30.7 mL/m2    LV SV Height Index 41.1 mL/m    LV Cardiac Output 6.37 L/min    LV CO BSA Index 2.79 L/min/m2    LVOT Area (calculated) 3.08 cm2    LVOT Stroke Volume 62.47 cc    LVOT SV BSA Index 27.40 mL/m2    LVOT SV Height Index 36.7 mL/m    LVOT Cardiac Output 5.69 L/min    LVOT Cardiac Index 2.49 L/min/m2    LV SV - LVOT SV Diff 7.53 mL    LVOT Stroke Rate (peak) 289.3 mL/s    LVOT Stroke Rate (mean) 212.3 mL/s    LV ASE Mass 261.8 gm    LV ASE Mass BSA Index 114.8 gm/m2    LV ASE Mass Height Index 153.8 gm/m    LV ASE Mass Height 2.7 Index 62.3 gm/m2.7    Mitral Annular E/Ea Vel Ratio 6.56      LA Diameter BSA Index 2.2 cm/m2    LA Diameter Height Index 2.9 cm/m    Echo RV Stroke Work Index Estimate 1,166.7 mmHgmL/m2    LVOT + AV Gradient (peak) 51.6 mmHg    AV Gradient (peak) 48.0 mmHg    AV Gradient (mean) 31.1 mmHg    LVOT/AV Velocity Ratio 0.26     AV Area (LVOT SV Mtd) 0.88 cm2    AV Area (LVOT SR Mtd) 0.81 cm2    AV Area (LV SV Mtd) 0.99 cm2    AV Area (LV SV) BSA Index 0.43 cm2/m2    MR Regurgitant Volume (LV SV Mtd) 7.5 mL    MR Regurgitant Fraction (LV SV Mtd) 0.11     RA Pressure Estimate 8 mmHg    Pulmonary Vascular Resistance Estimate 6.0 mmHg    RR Interval 831.51 ms    BP Systolic 761 mmHg    BP Diastolic 67 mmHg    LV wall/cavity ratio 0.52     E/A ratio 0.48     LV SV - LVOT SV Diff 10.75 %    LVED Volume BSA Index 43 ml/m2    LVES Volume BSA Index 13 ml/m2    LV Systolic Volume Index 60.7 mL/m2    LV Diastolic Volume Index 37.1 mL/m2    LA Systolic Volume 59 mL    LA Systolic Vol BSA Index 06.2 mL/m2    LA Systolic Vol Height Index 34.7 mL/m     *Note: Due to a large number of results and/or encounters for the requested time period, some results have not been displayed. A complete set of results can be found in Results Review.     Imaging findings: EKG: initial    Result Date: 01/03/2020  Sinus rhythm Prolonged PR interval Borderline T abnormalities, diffuse leads  CT ANGIO CHEST    Result Date: 01/02/2020  Incidentally visualized embolus in the proximal branch of the right upper lobe segmental pulmonary artery resulting in mild distention of the right main pulmonary artery without evidence of right heart strain. The thoracic aorta is normal in course and caliber with mild atherosclerotic calcification and unchanged soft plaque in the descending thoracic aorta near the arch. Previously noted presumed hematoma along the descending thoracic aorta at the level of the diaphragm is no longer appreciated on this study. Stable 6 mm left lower lobe pulmonary nodule. Please see separately dictated  abdominal/pelvis CT report for more detailed findings below the diaphragm. Findings discussed with Dr. Arsenio Loader over the phone at approximately 6:50 PM on 01/02/2020. END OF IMPRESSION I have personally reviewed the images and the Resident's/Fellow's interpretation and agree with or edited the findings. [facility] submits this DICOM format image data and final report to the [facility], an independent secure electronic health information exchange, on a reciprocally searchable basis (with patient authorization) for a minimum of 12 months after exam date.    CT cervical spine without contrast    Result Date: 01/04/2020  Redemonstration of the comminuted type III odontoid fracture with extension into the right lateral mass of C2. The fracture segments are in near-anatomic alignment without appreciable bony retropulsion. There is some degree of mild interval callus formation, however the majority of the fracture line is still visible. No new fracture of the cervical spine. END OF IMPRESSION [facility] submits this DICOM format image data and final report to the [facility], an independent secure electronic health information exchange, on a reciprocally searchable basis (with patient authorization) for a minimum of 12 months after exam date.    CT angio abdomen and pelvis    Result Date: 01/03/2020  Significant interval improvement/near complete resolution of previously identified periaortic fat stranding/hematoma. No acute findings in the abdomen or pelvis. Focal ectasia of the infrarenal abdominal aorta measures 2.2 cm. Mildly ectatic right common iliac artery. Please see separate CT chest report for supradiaphragmatic findings. END OF IMPRESSION I have personally reviewed the images and the Resident's/Fellow's interpretation and agree with or edited the findings. [facility] submits this DICOM format image data and final report to the [facility], an independent secure electronic health information exchange, on a  reciprocally searchable basis (with patient authorization) for a minimum of 12 months after exam date.    Cardiac Testing:    TTE 01/04/20:    Interpretation Summary    1.  Concentric LVH and left atrial enlargement  2.  Normal LVEF without regional wall motion abnormalities.   3.  Moderate to severe aortic valve stenosis   4.  Mitral valve sclerosis without significant stenosis  5.  Mild right ventricular enlargement/dysfunction with estimated mildly elevated pulmonary artery systolic pressure   6.  Mildly dilated ascending aorta       Consults: Cardiology    Assessment: The patient is a 74 year old female with a past medical history of CAD status post CABG, moderate to severe aortic stenosis, obstructive sleep apnea on CPAP, asthma, history of DVT and PE and a recent hospitalization for a fall who was advised to go to the ED for evaluation of abnormal imaging which included CTA showing PE. The patient reported dyspnea which started after the recent fall that resulted in type 3 odontoid fx, grade 1 splenic laceration, scalp laceration, extrapleural hematoma and multiple rib fxs.  She denies any chest pain, palpitations,  lower extremity edema, no extremity tenderness.     Here, she was hemodynamically stable, satting well on room air.  She was started on Eliquis.  TTE was ordered which did not show right heart strain.  She was seen by cardiology for recommendations on antiplatelet medications.  They recommended starting aspirin 81 mg daily, discontinuing Plavix and placing her on anticoagulation for the PE.  The patient remained stable without any signs of bleeding on the Eliquis and was discharged in a stable condition with outpatient follow-up.    Plan: As above  Disposition: Home  Follow-up:  with in 1 week. with PCP  Smoking Cessation: NA    Diagnoses that have been ruled out:   None   Diagnoses that are still under consideration:   None   Final diagnoses:   Pulmonary embolism, unspecified chronicity,  unspecified pulmonary embolism type, unspecified whether acute cor pulmonale present   Pulmonary embolism           Author: Bevelyn Ngo, MBBS  Note created: 01/04/2020  at: 11:58 AM     Bevelyn Ngo, MBBS  01/04/20 [phone]

## 2020-01-04 NOTE — ED Notes (Signed)
Patient discharged home, instructions given, understanding verbalized, no questions or concerns noted,  PIV removed with no s/s of infiltration or infection. Pressure dressing applied to site. Patient awaiting transport to exit.

## 2020-01-04 NOTE — Progress Notes (Signed)
[facility] Follow Up Visit Progress Note / Transitional Care Visit    Follow up from hospital admission at [facility] hospital, discharged to rehab facility thereafter, and in ED Obs last night.    Date of hospital admission: 6/14  Date of hospital discharge:6/29    [facility] rehab facility 6/29-8/12    Date of Initial contact by our office: 8/12    Recently in ED Obs 8/23-8/2    Chief Complaint   Patient presents with    Transition Care Managment(TCM)     Strong original admission 10/25/19, in ED last night for PE after a CT    Shortness of Breath     with excertion.  Due for Hep C, colon, and zoster      Hospitalization Summary    CONCISE NARRATIVE: 74 yo female w pmhx CAD, aortic stenosis, CABG on plavix, OSA, and DM presented as level 2 trauma after fall down ~15 stairs on 10/25/19. She was found to have a type 3 odontoid fx, grade 1 splenic laceration, scalp laceration, extrapleural hematoma and multiple rib fxs. She was admitted to the [facility] for q1 hour pulse checks and hemodynamic monitoring.     Vascular surgery ordered CTA which showed left retroperitoneal hematoma near the psoas muscle. No aortic extravasation. Patient to follow-up in one month with Dr. Lissa Merlin.    Urology consulted for increasing left retroperitoneal hematoma with wall thickening of the left ureter, no intervention required and recommended follow-up renal US in one month. Additionally her hospital stay was complicated by urinary retention. Her foley was replaced on 6/28 and she continues on Flomax at discharge.     Neurosurgery was consulted for type III odontoid fracture. She was managed non operatively with [place] J collar at all times for 4-6 weeks and follow-up with Dr. Marianne Sofia in 4 weeks.     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 Would like patient to follow up outpatient for dry eyes.     Plastic surgery was consulted for scalp laceration repair. She underwent staple placement  and they have since been removed. Recommended antibiotic ointment TID and follow up with Dr. Oswald Hillock in 1 week for a wound check.      [facility] d/c summary reviewed:  Had PT, OT, speech rehab, seen by urology and had foley removed 6/30 without complication. Dental exam 8/4, normal.       Subjective:    As of 01/04/2020 visit, patient has been doing okay since hospital discharge. During her FU CT scan yesterday was noted to have a PE on R upper lobe segmental pulmonary artery. Recalls she felt SOB during her rehab stay, but it wasn't severe so she didn't say anything. Vascular surgery recommended she go to ED; had echo done which was neg for R heart strain and started on eliquis. Got home this AM.      Previous imaging in June:  CT a/p:  Impression    There is fat stranding and possible extravasation from the left lumbar vein and thickening of the medial crus of the left hemidiaphragm. Adjacent to this site, there is a moderate degree of fat stranding around the aorta. This stranding is likely an   extension of the injury to the left lumbar vein with venous bleeding. However, an aortic injury cannot be entirely ruled out.     Small splenic laceration and perisplenic hematoma, consistent with a low grade splenic injury.     3 cm hematoma adjacent to the  pancreatic tail may represent a low grade pancreatic tail injury.     Mild endometrial thickening/low-attenuation material in the endometrial canal, more than expected for age. Recommend further evaluation with pelvic ultrasound on a nonemergent basis.       CTA neck : Impression    No evidence of acute carotid or vertebral artery dissection in the neck.     Severe left and moderate-to-severe right internal carotid narrowing just distal to the bifurcation likely due to chronic atherosclerotic changes with greater than 70% stenosis bilaterally.       Had repeat CT angio chest, a/p and c spine and incidental finding of PE of R main artery  Previous hemotoma of thoracic aorta  resolved  Stable 22mm LLL nodule    Improvement/near resolution of periaortic fat stranding, ectasia of infrarenal abd aorta and R common iliac artery, previous pancreatic tail hematoma resolved    C spine showed near anatomic aligment of comminuted type 3 odontoid fracture, no retropulsion, mild callus formation but persistent fracture line    Seeing vascular surgery tomorrow  Seeing NSGY 9/14, remains in brace    Had chronic cath, attempted removal 7/29 and was able to urinate thereafter.   UTI developed on 8/16, symptoms current resolved  Shingles outbreak 8/16 as well; rash improved but still has intermittent pain      ROS: Constitutional: Negative for fever and chills.   Respiratory: +SOB, usually by the end of her hallway at home from car to elevator      Cardiovascular: Denies palpitations  Gastrointestinal: Negative for abdominal pain, c/d.   MSK: + lump on outside of L thigh, felt it while lying down on stretcher yesterday. Denies pain. Not sure how long it's been present.    Hospital, rehab and ED records were obtained and reviewed in detail today, as noted above.     The patient's hospital discharge medications were reviewed and reconciled with the patient.  The patient's allergies were reviewed and confirmed with the patient.  The patient's past medical, surgical, social and/or family histories were reviewed and updated.      Objective:  BP 148/84    Pulse 94    Temp 35.9 C (96.7 F)    Ht 1.702 m (5' 7.01")    Wt 110.9 kg (244 lb 6.4 oz)    LMP  (LMP Unknown)    SpO2 95%    BMI 38.27 kg/m    Gen: appears well, NAD, normal mood and affect; answers questions appropriately. Here with c collar in place  HEENT: +r anisocoria and slight ptosis. L parietal scalp with healed scar, and minimal divot.   Lungs: clear to auscultation bilaterally with no rales, wheezes, or rhonchi  CV: RRR, +systolic murmur  Lower Extremities: no edema , no skin breakdown  MSK: L lateral thigh with asymmetric baseball sized nodular  mass at midpoint. Nontender, without firmness or induration.  No overlying skin changes  Skin: R flank with few erythematous papules , no current vesiculopustular lesions    Assessment and Plan:  1. Closed odontoid fracture with type III morphology and routine healing, subsequent encounter  Reviewed recent CT neck which shows slow formation of callus but persistent fracture line. Recommended continued wearing of collar and FU as scheduled with NSGY    2. Carotid stenosis, bilateral  Recommended FU with vascular surgery for serial monitoring versus intervention    3. Endometrial thickening on CT  Reviewed incidental finding today; will get pelvic US next month (pt currently  overwhelmed by appts)    4. Acute pulmonary embolism without acute cor pulmonale, unspecified pulmonary embolism type  This is pt's second provoked PE. Will refer to hematology for determination of further hypercoag work up and duration of treatment  - AMB REFERRAL TO ONCOLOGY    5. Mass of left thigh  ?muscle hematoma vs tendon rupture. reeommended US imaging for additional clarification  - [place] lower extremity LEFT non vascular; Future    6. Shingles  Resolving. Recommended d/c of valtrex as she has completed 7d worth.    Note that greater than 50% of this 51 min visit today was spent  reviewing hospital records with patient, reconciling patient's medications and coordinating patient's after hospital care.      Return to work date: n/a    F/u 1 month given complexity    The following patient instructions were reviewed verbally with the patient, printed on patient's after visit summary and given to patient at the end of today's visit.     There are no Patient Instructions on file for this visit.       Follow up:     Future Appointments       Provider [place]    01/04/2020 4:20 PM [name], MD [place] [facility]     01/06/2020 1:30 PM Margaretha Glassing, MD [facility] Urology     01/25/2020 12:00 PM Mable Paris, [place]  [facility] Neurosurgery     03/29/2020 8:15 AM MED [facility] PULMONARY LAB 3 [facility] PULMONAR    03/29/2020 9:00 AM Parmenter, Serafina Mitchell, NP [facility] [place] PULMONAR         [name], MD  [place] [facility]  01/04/2020  6:32 PM

## 2020-01-04 NOTE — Patient Instructions (Signed)
[facility]  [phone] opt #[phone]  Please contact their office to schedule your ultrasound

## 2020-01-04 NOTE — Discharge Instructions (Signed)
Please take aspirin and eliquis    Eliquis to be taken 10mg  BID for 7days and 5mg  BID thereafter    Please discontinue taking plavix    Please call your PCP or present to ED you are having difficulty breathing or chest pain    It was a pleasure taking care of you in the ED observation unit

## 2020-01-04 NOTE — ED Notes (Signed)
Patient returned to floor via wheel chair, VSS, tele continued. No concerns noted, assessment completed, POC reviewed. No questions noted at this time. Will continue to monitor and assess.

## 2020-01-04 NOTE — Progress Notes (Signed)
Occupational Therapy    OT consult received & attempted, however was unable to be completed as pt off unit for testing.  OT will plan to follow-up at a later time, however if needs arise in the interim please contact the OT pager [facility] [phone]).      Thank you.  Verdon Cummins, OT  PIC:  [phone]  01/04/2020

## 2020-01-04 NOTE — ED Notes (Signed)
Patient to echo via wheel chair with transport, per Dr. Willene Hatchet, patient may be off tele for testing.

## 2020-01-04 NOTE — ED Notes (Signed)
Patient informed of discharge at this time, was unaware, she is looking for a ride at this time, patient encouraged to let us know if there is any thing that we can do to help.

## 2020-01-05 ENCOUNTER — Telehealth: Payer: Self-pay

## 2020-01-05 ENCOUNTER — Ambulatory Visit: Payer: Medicare (Managed Care) | Admitting: Vascular Surgery

## 2020-01-05 NOTE — Telephone Encounter (Signed)
Patient called. She went over her medication list with home care and realized that she is still taking the Flomax(Tamsulosin) medication. Dr. Laurance Flatten asked her in her appointment on 8/24 if she was still taking Flomax and she said no because she wasn't familiar with the brand name of it. She just wanted Dr. Laurance Flatten know that she is indeed still taking Flomax and to update her chart. Please advise.

## 2020-01-05 NOTE — Telephone Encounter (Signed)
MAR updated    [name], MD  [facility]  01/05/2020  10:50 PM

## 2020-01-05 NOTE — Telephone Encounter (Signed)
Safe Transitions Review of Chart done today.  Risk Score %: 59   Patient is admitted to:   [facility]  EOU-80      Being followed by:   Emergency Department Observation Unit   Cardiology  Diagnosis:    Pulmonary Embolism  Est. Discharge Date:   Yesterday   8.24.21      [facility] staff to schedule home visit for follow up.    If at discharge PCP feels it is warranted, we can discuss Care Management needs at that time.   Time spent on chart review: 15 minutes    Encarnacion Chu, RN, [place]   [facility] [place]

## 2020-01-06 ENCOUNTER — Encounter: Payer: Self-pay | Admitting: Urology

## 2020-01-06 ENCOUNTER — Ambulatory Visit: Payer: Medicare (Managed Care) | Admitting: Urology

## 2020-01-06 VITALS — Ht 67.0 in | Wt 243.0 lb

## 2020-01-06 DIAGNOSIS — R3129 Other microscopic hematuria: Secondary | ICD-10-CM

## 2020-01-06 DIAGNOSIS — R935 Abnormal findings on diagnostic imaging of other abdominal regions, including retroperitoneum: Secondary | ICD-10-CM

## 2020-01-06 LAB — POCT URINALYSIS DIPSTICK
Blood,UA POCT: NEGATIVE
Glucose,UA POCT: NORMAL mg/dL
Ketones,UA POCT: NEGATIVE mg/dL
Leuk Esterase,UA POCT: 2 — AB
Lot #: 53600802
Nitrite,UA POCT: NEGATIVE
PH,UA POCT: 5 (ref 5–8)
Specific gravity,UA POCT: 1.02 (ref 1.002–1.030)

## 2020-01-06 NOTE — Patient Instructions (Signed)
Your provider has requested you follow up with imaging.  Our imaging scheduler will set up your imaging appointment for you after she obtains authorization from your insurance, please be on the lookout for notification of your appointment by call, letter, or mychart message. She will set you up for next available appointment at one of our [facility] offices, if the appointment she schedules for you does not work, please call the imaging office directly  to reschedule it.    You can schedule your appointment at your own convenience and at the location of your choosing by calling [facility] at the number below.  The imaging facility will reach out to your insurance company to request authorization to complete this testing.  Should you require additional assistance from our office please call our team at [phone].    X RAYS DO NOT NEED TO BE SCHEDULED - THESE ARE WALK IN.     [facility]  [phone]

## 2020-01-06 NOTE — Progress Notes (Signed)
HPI:  [name] is a 74 y.o. female   Here with daughter [name]  Was in [facility]  [phone]  Now home      Seen 10/26/2019 at [facility]  Presented as level 2 trauma, following fall down steps    She was found to have right scalp hematoma, left parietal scalp laceration, type III odontoid fracture, left 2 and 5-6 rib fxs, small splenic laceration, and periaortic fat stranding.     Pt became hypotensive with CT scan due to periaortic fat stranding previously seen. Vascular surgery consulted and determined there was no concern for aortic injury and no need for intervention from their standpoint. Pt received 2 L of IVF bolus and 2 unit RBC. Pt then became hypertensive with systolic BP 086-761P and patient received hydralazine. Pt reports having some left sided pain but improved with recent pain medication administration.     Denies hematuria or history of kidney/bladder issues.   Cr 0.77  Hematocrit 38 (33, s/p 2 unit RBC)     CT Angio abdomen and pelvis today shows stable fat stranding/hematoma near the upper abdominal aorta. Increased hematoma in the left retroperitoneum and abutting the psoas muscle." Wall thickening of the proximal to mid ureter without contrast extravasation, mild associated left hydronephrosis". Stable 3 cm hematoma adjacent to pancreatic tail, small hematoma along left lower abdominal wall laterally.     Low concern for ureteral injury, and with paraaortic stranding, the thought was this created stranding around left kidney and tracking down ureteral plane.     plan was for repeat images [place] 12/03/2019 done at nursing home was normal (reviewed)      was CT angio abdomen pelvis pelvis 12/28/2019  CT urogram ordered to be done at the same time. This was NOT performed as requested with urogram component    CT angio shows a less prominent left ureter in prior area of concern     Incidentally visualized embolus in the proximal branch of the right upper lobe segmental  pulmonary   Now eliquis     ------------  She still had a catheter in place wehen seen 12/09/2019. Catheter was attempted to be removed x3 times. But was not asked to void sitting, was sp trauma at the time.   Removed 12/09/2019     Had a e. Coli UTi (dysuria), given antibiotic. better          ---------  with a history of CAD s/p CABG (2012) and stenting (2015, on plavix), asthma, COPD and OSA       PMH:         Past Medical History:   Diagnosis Date    Allergic rhinitis 09/30/2014    Arthritis     Asthma     CAD (coronary artery disease)     DVT (deep venous thrombosis)     post-op after CABG, on anticoagulation for ~2 years    PE (pulmonary embolism)     post-op after CABG, on anticoagulation for ~2 years    Prediabetes     Tubular adenoma 03/2015    colonoscopy        PSH:        Past Surgical History:   Procedure Laterality Date    [place]      CORONARY ARTERY BYPASS GRAFT  2013  triple     CYST REMOVAL      both thumbs    PR REPAIR EYE BLOWOUT,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    Procedure: ORBIT ORIF;  Surgeon: Jannett Celestine, DDS;  Location: [facility] MAIN OR;  Service: OMFS    TONSILLECTOMY AND ADENOIDECTOMY       Current Outpatient Medications   Medication    tamsulosin (FLOMAX) 0.4 MG capsule    aspirin 81 mg chewable tablet    apixaban (ELIQUIS) 5 MG tablet    apixaban (ELIQUIS) 5 MG tablet    aspirin 81 MG EC tablet    acetaminophen (TYLENOL) 500 mg tablet    ibuprofen (ADVIL,MOTRIN) 600 MG tablet    senna (SENOKOT) 8.6 MG tablet    melatonin 3 MG    ARTIFICIAL TEARS (LUBRIFRESH PM,GENTEAL PM) ointment    budesonide (PULMICORT) 0.25 MG/2ML nebulizer solution    montelukast (SINGULAIR) 10 MG tablet    sertraline (ZOLOFT) 100 MG tablet    fluticasone-umeclidinium-vilanterol (TRELEGY ELLIPTA) 200-62.5-25 MCG/INH diskus inhaler    albuterol HFA  (PROVENTIL, VENTOLIN, PROAIR HFA) 108 (90 Base) MCG/ACT inhaler    gabapentin (GABAPENTIN) 300 MG capsule    pantoprazole (PROTONIX) 20 MG EC tablet    azelastine (ASTELIN) 0.1 % nasal spray    Spacer/Aero-Holding Chambers (EASIVENT) spacer    fluticasone (FLONASE) 50 MCG/ACT nasal spray    Non-System Medication    ipratropium-albuterol (DUONEB) 0.5-2.5mg  /59mL nebulizer solution    ipratropium (ATROVENT) 0.06 % nasal spray    Misc. Devices (CPAP) machine    atorvastatin (LIPITOR) 40 MG tablet    Cholecalciferol (VITAMIN D3) 5000 UNITS TABS     No current facility-administered medications for this visit.       Allergies:         Allergies   Allergen Reactions    Morphine Hives     Has tolerated Percocet            Social History:   Social History           Socioeconomic History    Marital status: Widowed     Spouse name: Not on file    Number of children: Not on file    Years of education: Not on file    Highest education level: Not on file   Tobacco Use    Smoking status: Never Smoker    Smokeless tobacco: Never Used   Substance and Sexual Activity    Alcohol use: No    Drug use: No    Sexual activity: Not on file   Other Topics Concern    Not on file   Social History Narrative    May 2016        Lives with her husband in an in law suite at her son's house        Three children, all live locally     Five grandchildren     Retired from school food service       Family Hx:        Family History   Problem Relation Age of Onset    Other Mother         "rare lung disease"    Aneurysm Mother         abdominal aortic aneurysm    Diabetes Type II Mother     Breast cancer Mother     Blood clots Mother     Stroke Father     Stroke Brother  x2    Thyroid cancer Daughter            ROS    General: Alert but sleepy after pain medication, appears stated age and cooperative, resting comfortably in bed  Chest: Unlabored respirations   Abdomen: Soft,  non-distended, non-tender, no suprapubic tenderness, no guarding or rigidity. No ecchymosis noted to abdomen or left flank.   GENITOURINARY: foley in place draining clear yellow urine.  Psych: Affect pleasant, makes eye contact.     PE  .vs  LMP  (LMP Unknown)   LMP  (LMP Unknown)    General appearance - alert, well appearing, and in no distress  Eyes - sclera anicteric    Chest - normal respiratory effort        Extremities - no pedal edema                 Assessment:  [name] is a 74 y.o. female with presented 11/25/2019 as level II trauma following fall down steps yesterday.     She was found to have right scalp hematoma, left parietal scalp laceration, type III odontoid fracture, left 2 and 5-6 rib fxs, small splenic laceration, and periaortic fat stranding.     CT Angio abdomen and pelvis today shows stable fat stranding/hematoma near the upper abdominal aorta. Increased hematoma in the left retroperitoneum and abutting the psoas muscle, no injury evident to kidney. Wall thickening of the proximal to mid ureter without contrast extravasation, contrast present in ureter distal to area of thickening. Stable 3 cm hematoma adjacent to pancreatic tail, small hematoma along left lower abdominal wall laterally.       Low concern for ureteral injury, and with paraaortic stranding, the thought was this created stranding around left kidney and tracking down ureteral plane.     plan was for repeat images plan was for repeat images [place] 12/03/2019 done at nursing home was normal (reviewed)    was CT angio abdomen pelvis pelvis 12/28/2019  CT urogram ordered to be done at the same time. This was NOT performed as requested with urogram component    CT angio shows a less prominent left ureter in prior area of concern       Urine today shows no blood    We discussed that our preference was to get urogrma study which was not done. At this point, given improvement of images on CTand no blood in urine, I feel  that either follow up PRN or [place] in a few months may be reasonable.         -----------------          Plan:    US renal in 3 months  Video visit after      Margaretha Glassing, MD    All notes, labs, and images referenced in the note, were independently reviewed.

## 2020-01-07 ENCOUNTER — Encounter: Payer: Medicare (Managed Care) | Admitting: Family Medicine

## 2020-01-19 ENCOUNTER — Encounter: Payer: Self-pay | Admitting: Gastroenterology

## 2020-01-20 ENCOUNTER — Ambulatory Visit: Payer: Medicare (Managed Care)

## 2020-01-25 ENCOUNTER — Ambulatory Visit: Payer: Medicare (Managed Care) | Admitting: Neurosurgery

## 2020-01-26 ENCOUNTER — Other Ambulatory Visit: Payer: Self-pay | Admitting: Vascular Surgery

## 2020-01-26 DIAGNOSIS — I6523 Occlusion and stenosis of bilateral carotid arteries: Secondary | ICD-10-CM

## 2020-01-31 ENCOUNTER — Ambulatory Visit
Admission: RE | Admit: 2020-01-31 | Discharge: 2020-01-31 | Disposition: A | Discharge status: Home / Self Care | Payer: Medicare (Managed Care) | Source: Ambulatory Visit | Attending: Vascular Surgery | Admitting: Vascular Surgery

## 2020-01-31 ENCOUNTER — Other Ambulatory Visit: Payer: Self-pay | Admitting: Vascular Surgery

## 2020-01-31 DIAGNOSIS — I6523 Occlusion and stenosis of bilateral carotid arteries: Secondary | ICD-10-CM

## 2020-01-31 LAB — CV US CAROTID BILATERAL
Left Carotid Bulb EDV: 14.86 cm/s
Left Carotid Bulb PSV: 50.39 cm/s
Left Common Carotid Artery EDV Dist: 19.24 cm/s
Left Common Carotid Artery EDV Prox: 17.63 cm/s
Left Common Carotid Artery PSV Dist: 69.37 cm/s
Left Common Carotid Artery PSV Prox: 80.69 cm/s
Left External Carotid Artery EDV: 18.77 cm/s
Left External Carotid Artery PSV: 120.71 cm/s
Left ICA/CCA Ratio: 5.95
Left Internal Carotid Artery EDV Dist: 17.63 cm/s
Left Internal Carotid Artery EDV Mid: 90.63 cm/s
Left Internal Carotid Artery EDV Prox: 171.25 cm/s
Left Internal Carotid Artery PSV Dist: 51.61 cm/s
Left Internal Carotid Artery PSV Mid: 231.82 cm/s
Left Internal Carotid Artery PSV Prox: 412.64 cm/s
Left Subclavian Artery EDV: 0 cm/s
Left Subclavian Artery PSV: 132.51 cm/s
Left Vertebral Artery EDV: 30.58 cm/s
Left Vertebral Artery PSV: 82.35 cm/s
Right Carotid Bulb EDV: 58 cm/s
Right Carotid Bulb PSV: 188 cm/s
Right Common Carotid Artery EDV Dist: 14.18 cm/s
Right Common Carotid Artery EDV Prox: 15.28 cm/s
Right Common Carotid Artery PSV Dist: 46.05 cm/s
Right Common Carotid Artery PSV Prox: 58.14 cm/s
Right External Carotid Artery EDV: 54.42 cm/s
Right External Carotid Artery PSV: 273.94 cm/s
Right ICA/CCA Ratio: 12.27
Right Internal Carotid Artery EDV Dist: 26 cm/s
Right Internal Carotid Artery EDV Mid: 63.09 cm/s
Right Internal Carotid Artery EDV Prox: 246.81 cm/s
Right Internal Carotid Artery PSV Dist: 83 cm/s
Right Internal Carotid Artery PSV Mid: 110.55 cm/s
Right Internal Carotid Artery PSV Prox: 564.94 cm/s
Right Subclavian Artery EDV: 4.15 cm/s
Right Subclavian Artery PSV: 120.44 cm/s
Right Vertebral Artery EDV: 19.58 cm/s
Right Vertebral Artery PSV: 59.13 cm/s

## 2020-01-31 NOTE — Progress Notes (Addendum)
[name]  Age: 74 y.o.  DOB: 01-22-1946  MRN: [id number]  [address] [place] [address]  [address]  [place]  [id number]  Preferred language: ENGLISH    Referring Provider: PCP Dr. [name]  Reason: PE  Assessment:   01/03/20 ED w/ PE  01/04/20 PCP OV note  02/09/20 ED for SOB  02/15/20 PCP OV note CHART REVIEW    Labs: RESULTS REVIEW  Imaging: CHART REVIEW Image      Past Medical History:   Diagnosis Date    Allergic rhinitis 09/30/2014    Arthritis     Asthma     CAD (coronary artery disease)     DVT (deep venous thrombosis)     post-op after CABG, on anticoagulation for ~2 years    PE (pulmonary embolism)     post-op after CABG, on anticoagulation for ~2 years    Prediabetes     Tubular adenoma 03/2015    colonoscopy        Current Outpatient Medications on File Prior to Visit   Medication Sig Dispense Refill    tamsulosin (FLOMAX) 0.4 MG capsule Take 0.4 mg by mouth every evening      aspirin 81 mg chewable tablet Take 1 tablet (81 mg total) by mouth daily 30 tablet 0    apixaban (ELIQUIS) 5 MG tablet Take 10 mg (2 tablets) BID x 7 days, then 5 mg (1 tablet) BID thereafter 74 tablet 0    apixaban (ELIQUIS) 5 MG tablet Take 10 mg (2 tablets) BID x 7 days, then 5 mg (1 tablet) BID thereafter 74 tablet 0    aspirin 81 MG EC tablet Take 1 tablet (81 mg total) by mouth daily 30 tablet 0    acetaminophen (TYLENOL) 500 mg tablet Take 2 tablets (1,000 mg total) by mouth 3 times daily 30 tablet     ibuprofen (ADVIL,MOTRIN) 600 MG tablet Take 1 tablet (600 mg total) by mouth 3 times daily as needed for Pain      senna (SENOKOT) 8.6 MG tablet Take 2 tablets by mouth daily      melatonin 3 MG Take 1 tablet (3 mg total) by mouth nightly as needed for Sleep      ARTIFICIAL TEARS (LUBRIFRESH PM,GENTEAL PM) ointment Please administer every 6 hours to both eyes 3.5 g     budesonide (PULMICORT) 0.25 MG/2ML nebulizer solution Inhale 0.25 mg into the lungs daily      montelukast (SINGULAIR) 10 MG tablet Take 1  tablet (10 mg total) by mouth nightly 30 tablet 11    sertraline (ZOLOFT) 100 MG tablet Take 1 tablet (100 mg total) by mouth daily Replacing 50mg  dose, please cancel other refills 90 tablet 3    fluticasone-umeclidinium-vilanterol (TRELEGY ELLIPTA) 200-62.5-25 MCG/INH diskus inhaler Inhale 1 puff into the lungs daily 1 each 5    albuterol HFA (PROVENTIL, VENTOLIN, PROAIR HFA) 108 (90 Base) MCG/ACT inhaler Inhale 2 puffs into the lungs every 4-6 hours as needed 1 each 6    gabapentin (GABAPENTIN) 300 MG capsule Take 1 capsule (300 mg total) by mouth 3 times daily 180 capsule 2    pantoprazole (PROTONIX) 20 MG EC tablet Take 1 tablet (20 mg total) by mouth daily 30 min before breakfast Swallow whole. Do not crush, break, or chew. 90 tablet 3    azelastine (ASTELIN) 0.1 % nasal spray 1-2 sprays by Nasal route 2 times daily 90 mL 3    Spacer/Aero-Holding Chambers (EASIVENT) spacer Use twice daily  with Symbicort inhaler. Breathe in slowly. 1 each 2    fluticasone (FLONASE) 50 MCG/ACT nasal spray 2 sprays by Nasal route daily 48 g 1    Non-System Medication Medication/Supply: R thumb spica brace  Directions for Use: wear nightly 1 each 0    ipratropium-albuterol (DUONEB) 0.5-2.5mg  /32mL nebulizer solution Take 3 mLs by nebulization every 6 hours 90 mL 1    ipratropium (ATROVENT) 0.06 % nasal spray 2 sprays by Each Nare route 3 times daily 15 mL 0    Misc. Devices (CPAP) machine Download and compliance report 1 each 0    atorvastatin (LIPITOR) 40 MG tablet Take 40 mg by mouth daily (with dinner)      Cholecalciferol (VITAMIN D3) 5000 UNITS TABS Take 5,000 Units by mouth every morning          No current facility-administered medications on file prior to visit.

## 2020-01-31 NOTE — Progress Notes (Signed)
Patient seen briefly following carotid ultrasound.  Found to have severe bilateral ICA stenosis.  Patient denies symptoms of stroke or TIA including amaurosis fugax, unilateral weakness/paralysis, facial droop, or incoherent speech.  Patient is currently on daily ASA and statin.  Will obtain stat CTA head and neck and patient will return to clinic to discuss results and surgical options.  Patient instructed to seek immediate medial attention for any signs or symptoms of stroke or TIA.

## 2020-02-01 ENCOUNTER — Ambulatory Visit: Payer: Medicare (Managed Care) | Admitting: Neurosurgery

## 2020-02-01 ENCOUNTER — Encounter: Payer: Self-pay | Admitting: Neurosurgery

## 2020-02-01 DIAGNOSIS — S12120D Other displaced dens fracture, subsequent encounter for fracture with routine healing: Secondary | ICD-10-CM

## 2020-02-01 NOTE — Progress Notes (Signed)
ROUGH DRAFT - see letter with the same date as encounter for finalized evaluation      Latoya Gulling, MD, [facility] in [place], [place] and [place], [address]  [place], [place] [address]  [phone] - main  [phone] - fax    February 01, 2020    RE:  [name] [place], May 28, 1945    Dear Latoya Drilling, MD,    I had the pleasure of seeing [name] today in follow up for a type III odontoid fracture.     At her last visit:"Impression/Plan: 74 y.o. female s/p fall on 6/14 with a type III odontoid fracture with extension into right lateral mass and possible injury to the anterior longitudinal ligament. Repeat CT cervical spine is ordered to assess for bony healing. She will be seen back once this is completed for cervical collar clearance".     [name] is a 74 year old female with a PMH of CAD, aortic stenosis, CABG on plavix, OSA, and DM who presented as level 2 trauma after fall down 15 stairs on 10/25/19. She was found to have a type III odontoid fx, grade 1 splenic laceration, scalp laceration, extrapleural hematoma and multiple rib fractures. Neurosurgery was consulted for a type III odontoid fracture with extension into right lateral mass and possible injury to the anterior longitudinal ligament. She was placed in a cervical collar for management. She presents today reporting some continued right posterior neck pain; improved from prior. Collar remains in place. She is taking Tylenol as needed for pain.     Past Medical History:   Past Medical History:   Diagnosis Date    Allergic rhinitis 09/30/2014    Arthritis     Asthma     CAD (coronary artery disease)     DVT (deep venous thrombosis)     post-op after CABG, on anticoagulation for ~2 years    PE (pulmonary embolism)     post-op after CABG, on anticoagulation for ~2 years    Prediabetes     Tubular adenoma 03/2015    colonoscopy      Past Surgical History:    Past Surgical History:   Procedure Laterality Date    ACHILLES TENDON SURGERY      APPENDECTOMY      CHOLECYSTECTOMY      CORONARY ANGIOPLASTY WITH STENT PLACEMENT  2012    CORONARY ARTERY BYPASS GRAFT  2013    triple     CYST REMOVAL      both thumbs    PR REPAIR EYE BLOWOUT,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    Procedure: ORBIT ORIF;  Surgeon: Jannett Celestine, DDS;  Location: [facility] MAIN OR;  Service: OMFS    TONSILLECTOMY AND ADENOIDECTOMY       Allergies:   Allergies   Allergen Reactions    Morphine Hives     Has tolerated Percocet       Medications:  Current Outpatient Medications   Medication    tamsulosin (FLOMAX) 0.4 MG capsule    aspirin 81 mg chewable tablet    apixaban (ELIQUIS) 5 MG tablet    apixaban (ELIQUIS) 5 MG tablet    aspirin 81 MG EC tablet    acetaminophen (TYLENOL) 500 mg tablet    ibuprofen (ADVIL,MOTRIN) 600 MG tablet    senna (SENOKOT) 8.6 MG tablet    melatonin 3 MG    ARTIFICIAL TEARS (LUBRIFRESH PM,GENTEAL PM) ointment    budesonide (PULMICORT)  0.25 MG/2ML nebulizer solution    montelukast (SINGULAIR) 10 MG tablet    sertraline (ZOLOFT) 100 MG tablet    fluticasone-umeclidinium-vilanterol (TRELEGY ELLIPTA) 200-62.5-25 MCG/INH diskus inhaler    albuterol HFA (PROVENTIL, VENTOLIN, PROAIR HFA) 108 (90 Base) MCG/ACT inhaler    gabapentin (GABAPENTIN) 300 MG capsule    pantoprazole (PROTONIX) 20 MG EC tablet    azelastine (ASTELIN) 0.1 % nasal spray    Spacer/Aero-Holding Chambers (EASIVENT) spacer    fluticasone (FLONASE) 50 MCG/ACT nasal spray    Non-System Medication    ipratropium-albuterol (DUONEB) 0.5-2.5mg  /20mL nebulizer solution    ipratropium (ATROVENT) 0.06 % nasal spray    Misc. Devices (CPAP) machine    atorvastatin (LIPITOR) 40 MG tablet    Cholecalciferol (VITAMIN D3) 5000 UNITS TABS     No current facility-administered medications for this visit.     Social History:  Social History     Socioeconomic History    Marital status: Widowed     Spouse  name: Not on file    Number of children: Not on file    Years of education: Not on file    Highest education level: Not on file   Tobacco Use    Smoking status: Never Smoker    Smokeless tobacco: Never Used   Substance and Sexual Activity    Alcohol use: No    Drug use: No    Sexual activity: Not on file   Other Topics Concern    Not on file   Social History Narrative    May 2016        Lives with her husband in an in law suite at her son's house        Three children, all live locally     Five grandchildren     Retired from school food service     Family History:  Family History   Problem Relation Age of Onset    Other Mother         "rare lung disease"    Aneurysm Mother         abdominal aortic aneurysm    Diabetes Type II Mother     Breast cancer Mother     Blood clots Mother     Stroke Father     Stroke Brother         x2    Thyroid cancer Daughter        Review of Symptoms:  Twelve systems have been reviewed and pertinent positives noted in HPI.  All other systems negative:  Constitutional symptoms not present  Eyes not present  Ears, Nose, Mouth, Throat not present  Cardiovascular not present  Respiratory not present  Gastrointestinal not present  Genitourinary not present  Musculoskeletal present  Neurological present  Psychiatric not present  Integumentary not present  Endocrine not present  Hematologic/Lymphatic not present  Allergic/Immunologic not present    Physical Exam:  Well-developed and in no acute distress.  Eyes:  EOMI, sclera clear  Neck:  Supple.  Non-tender. In cervical collar.   Cardiovascular:  No cyanosis, good capillary refill.  Pulmonary:  Clear, even, and easy respirations.  Skin:  Warm, dry, and pink.   Musculoskeletal:  Range of motion full.  Normal tone.  No atrophy.  No fasciculations.  Focused neurological exam was performed:    Alert and oriented to person, place and time.  Speech is clear and fluent.  Cranial nerves II-XII intact.    Strength: Full power  throughout.  Sensory exam intact.  Gait is narrow-based and steady.      Imaging:  CT cervical spine without contrast  Result Date: 01/04/2020  Redemonstration of the comminuted type III odontoid fracture with extension into the right lateral mass of C2. The fracture segments are in near-anatomic alignment without appreciable bony retropulsion. There is some degree of mild interval callus formation, however the majority of the fracture line is still visible. No new fracture of the cervical spine.     Impression/Plan: 74 y.o. female s/p fall on 6/14 with a type III odontoid fracture with extension into right lateral mass and possible injury to the anterior longitudinal ligament. Repeat CT shows chronic non-union but with good alignment. Collar is removed. She will ear for comfort only. Repeat CT in 2 months with follow-up thereafter.    Visit was 25 minutes, greater than 50% of the time was spent counseling/coordinating care.    Thank you for this opportunity to care for your patient.    Sincerely,    Tillie Rung. Marianne Sofia, MD, Erenest Rasher, Associate Professor of Neurosurgery  (NPI: [id number])  Cassandra June, FNP-C     A [facility], [facility], and [employer] member practice          cc:

## 2020-02-02 ENCOUNTER — Other Ambulatory Visit: Payer: Self-pay | Admitting: Primary Care

## 2020-02-02 MED ORDER — TAMSULOSIN HCL 0.4 MG PO CAPS *I*
0.4000 mg | ORAL_CAPSULE | Freq: Every evening | ORAL | 3 refills | Status: DC
Start: 2020-02-02 — End: 2020-06-15

## 2020-02-02 MED ORDER — PANTOPRAZOLE SODIUM 40 MG PO TBEC *I*
40.0000 mg | DELAYED_RELEASE_TABLET | Freq: Every day | ORAL | 3 refills | Status: DC
Start: 2020-02-02 — End: 2021-01-22

## 2020-02-04 ENCOUNTER — Other Ambulatory Visit: Payer: Medicare (Managed Care)

## 2020-02-04 ENCOUNTER — Ambulatory Visit
Admission: RE | Admit: 2020-02-04 | Discharge: 2020-02-04 | Disposition: A | Discharge status: Home / Self Care | Payer: Medicare (Managed Care) | Source: Ambulatory Visit | Attending: Vascular Surgery | Admitting: Vascular Surgery

## 2020-02-04 DIAGNOSIS — I6782 Cerebral ischemia: Secondary | ICD-10-CM

## 2020-02-04 DIAGNOSIS — I6523 Occlusion and stenosis of bilateral carotid arteries: Secondary | ICD-10-CM

## 2020-02-04 MED ORDER — IOHEXOL 350 MG/ML (OMNIPAQUE) IV SOLN 500ML BOTTLE *I*
1.0000 mL | Freq: Once | INTRAVENOUS | Status: AC
Start: 2020-02-04 — End: 2020-02-04
  Administered 2020-02-04: 70 mL via INTRAVENOUS

## 2020-02-07 ENCOUNTER — Ambulatory Visit
Admission: RE | Admit: 2020-02-07 | Discharge: 2020-02-07 | Disposition: A | Discharge status: Home / Self Care | Payer: Medicare (Managed Care) | Source: Ambulatory Visit | Attending: Urology | Admitting: Urology

## 2020-02-07 ENCOUNTER — Ambulatory Visit
Admission: RE | Admit: 2020-02-07 | Discharge: 2020-02-07 | Disposition: A | Discharge status: Home / Self Care | Payer: Medicare (Managed Care) | Source: Ambulatory Visit | Admitting: Radiology

## 2020-02-07 ENCOUNTER — Other Ambulatory Visit: Payer: Self-pay

## 2020-02-07 DIAGNOSIS — Y929 Unspecified place or not applicable: Secondary | ICD-10-CM | POA: Insufficient documentation

## 2020-02-07 DIAGNOSIS — W19XXXA Unspecified fall, initial encounter: Secondary | ICD-10-CM | POA: Insufficient documentation

## 2020-02-07 DIAGNOSIS — I2699 Other pulmonary embolism without acute cor pulmonale: Secondary | ICD-10-CM

## 2020-02-07 DIAGNOSIS — R2242 Localized swelling, mass and lump, left lower limb: Secondary | ICD-10-CM

## 2020-02-07 DIAGNOSIS — N2889 Other specified disorders of kidney and ureter: Secondary | ICD-10-CM

## 2020-02-07 DIAGNOSIS — R935 Abnormal findings on diagnostic imaging of other abdominal regions, including retroperitoneum: Secondary | ICD-10-CM | POA: Insufficient documentation

## 2020-02-09 ENCOUNTER — Other Ambulatory Visit: Payer: Self-pay

## 2020-02-09 ENCOUNTER — Ambulatory Visit: Payer: Medicare (Managed Care) | Admitting: Vascular Surgery

## 2020-02-09 ENCOUNTER — Encounter: Payer: Self-pay | Admitting: Vascular Surgery

## 2020-02-09 ENCOUNTER — Emergency Department
Admission: EM | Admit: 2020-02-09 | Discharge: 2020-02-09 | Disposition: A | Discharge status: Home / Self Care | Payer: Medicare (Managed Care) | Source: Ambulatory Visit | Attending: Emergency Medicine | Admitting: Emergency Medicine

## 2020-02-09 ENCOUNTER — Emergency Department: Payer: Medicare (Managed Care) | Admitting: Radiology

## 2020-02-09 VITALS — BP 152/92 | Ht 67.0 in | Wt 250.0 lb

## 2020-02-09 DIAGNOSIS — R300 Dysuria: Secondary | ICD-10-CM

## 2020-02-09 DIAGNOSIS — Z7901 Long term (current) use of anticoagulants: Secondary | ICD-10-CM | POA: Insufficient documentation

## 2020-02-09 DIAGNOSIS — I440 Atrioventricular block, first degree: Secondary | ICD-10-CM

## 2020-02-09 DIAGNOSIS — Z789 Other specified health status: Secondary | ICD-10-CM

## 2020-02-09 DIAGNOSIS — Z86711 Personal history of pulmonary embolism: Secondary | ICD-10-CM

## 2020-02-09 DIAGNOSIS — R05 Cough: Secondary | ICD-10-CM

## 2020-02-09 DIAGNOSIS — R0602 Shortness of breath: Secondary | ICD-10-CM | POA: Insufficient documentation

## 2020-02-09 DIAGNOSIS — J9811 Atelectasis: Secondary | ICD-10-CM

## 2020-02-09 DIAGNOSIS — I6523 Occlusion and stenosis of bilateral carotid arteries: Secondary | ICD-10-CM

## 2020-02-09 LAB — CBC AND DIFFERENTIAL
Baso # K/uL: 0 10*3/uL (ref 0.0–0.1)
Basophil %: 0.5 %
Eos # K/uL: 0.2 10*3/uL (ref 0.0–0.4)
Eosinophil %: 2.9 %
Hematocrit: 41 % (ref 34–45)
Hemoglobin: 12.9 g/dL (ref 11.2–15.7)
Lymph # K/uL: 1.9 10*3/uL (ref 1.2–3.7)
Lymphocyte %: 24.3 %
MCH: 29 pg (ref 26–32)
MCHC: 32 g/dL (ref 32–36)
MCV: 91 fL (ref 79–95)
Mono # K/uL: 0.6 10*3/uL (ref 0.2–0.9)
Monocyte %: 7 %
Neut # K/uL: 5.2 10*3/uL (ref 1.6–6.1)
Platelets: 312 10*3/uL (ref 160–370)
RBC: 4.5 MIL/uL (ref 3.9–5.2)
RDW: 13.9 % (ref 11.7–14.4)
Seg Neut %: 65.3 %
WBC: 8 10*3/uL (ref 4.0–10.0)

## 2020-02-09 LAB — BASIC METABOLIC PANEL
Anion Gap: 9 (ref 7–16)
CO2: 30 mmol/L — ABNORMAL HIGH (ref 20–28)
Calcium: 10.1 mg/dL (ref 8.6–10.2)
Chloride: 104 mmol/L (ref 96–108)
Creatinine: 0.78 mg/dL (ref 0.51–0.95)
GFR,Black: 87 *
GFR,Caucasian: 75 *
Glucose: 102 mg/dL — ABNORMAL HIGH (ref 60–99)
Lab: 19 mg/dL (ref 6–20)
Potassium: 4.8 mmol/L (ref 3.3–5.1)
Sodium: 143 mmol/L (ref 133–145)

## 2020-02-09 LAB — URINALYSIS, DIPSTICK ONLY (STRONG WEST)
Ketones, UA: NEGATIVE
Nitrite,UA: NEGATIVE
Protein,UA: NEGATIVE
Specific Gravity,UA: 1.02 (ref 1.002–1.030)
pH,UA: 5.5 (ref 5.0–8.0)

## 2020-02-09 MED ORDER — APIXABAN 5 MG PO TABS *I*
10.0000 mg | ORAL_TABLET | Freq: Two times a day (BID) | ORAL | 0 refills | Status: DC
Start: 2020-02-09 — End: 2020-02-15

## 2020-02-09 MED ORDER — APIXABAN 5 MG PO TABS *I*
10.0000 mg | ORAL_TABLET | Freq: Once | ORAL | Status: AC
Start: 2020-02-09 — End: 2020-02-09
  Administered 2020-02-09: 10 mg via ORAL
  Filled 2020-02-09: qty 2

## 2020-02-09 NOTE — Telephone Encounter (Signed)
Returning call to [facility], spoke to secretary who confirmed that pt had already checked out, Network engineer spoke to NP who stated that pt was referred to ED.  Unknown last date of Eliquis.

## 2020-02-09 NOTE — Progress Notes (Signed)
Subjective:     Patient is a 74 y.o. female who presents for evaluation after hospital discharge. She sustained a fall which resulted in numerous injuries.  She is here today to follow up for the concern of aortic injury. She reports that she is having increased work of breathing and becoming more short of breath.  She is fatigued.  She was diagnosed with a PE however has not been taking her Eliquis for the past 2 weeks as her prescription ran out and she has had trouble getting a refill.  Her daughters express their significant concern for their mother's health today.    She did have a CTA chest done which shows no evidence of an aortic injury.    B carotid artery duplex was done with high grade stenosis B.  This was confirmed by CTA head and neck.      Review of Systems  A comprehensive review of systems was negative.    Objective:     Physical Exam:  NAD  RRR  Abd soft, nt, nd  No cervical bruits     Vitals:    02/09/20 0854   BP: (!) 152/92   Weight:    Height:      Current Outpatient Medications   Medication    tamsulosin (FLOMAX) 0.4 MG capsule    pantoprazole (PROTONIX) 40 MG EC tablet    apixaban (ELIQUIS) 5 MG tablet    apixaban (ELIQUIS) 5 MG tablet    acetaminophen (TYLENOL) 500 mg tablet    ibuprofen (ADVIL,MOTRIN) 600 MG tablet    senna (SENOKOT) 8.6 MG tablet    melatonin 3 MG    ARTIFICIAL TEARS (LUBRIFRESH PM,GENTEAL PM) ointment    budesonide (PULMICORT) 0.25 MG/2ML nebulizer solution    montelukast (SINGULAIR) 10 MG tablet    sertraline (ZOLOFT) 100 MG tablet    fluticasone-umeclidinium-vilanterol (TRELEGY ELLIPTA) 200-62.5-25 MCG/INH diskus inhaler    albuterol HFA (PROVENTIL, VENTOLIN, PROAIR HFA) 108 (90 Base) MCG/ACT inhaler    gabapentin (GABAPENTIN) 300 MG capsule    azelastine (ASTELIN) 0.1 % nasal spray    Spacer/Aero-Holding Chambers (EASIVENT) spacer    fluticasone (FLONASE) 50 MCG/ACT nasal spray    Non-System Medication    ipratropium-albuterol (DUONEB) 0.5-2.5mg   /18mL nebulizer solution    Misc. Devices (CPAP) machine    atorvastatin (LIPITOR) 40 MG tablet    Cholecalciferol (VITAMIN D3) 5000 UNITS TABS    apixaban (ELIQUIS) 5 MG tablet    clopidogrel (PLAVIX) 75 MG tablet    ipratropium (ATROVENT) 0.06 % nasal spray     No current facility-administered medications for this visit.     Allergies   Allergen Reactions    Morphine Hives     Has tolerated Percocet      Past Medical History:   Diagnosis Date    Allergic rhinitis 09/30/2014    Arthritis     Asthma     CAD (coronary artery disease)     DVT (deep venous thrombosis)     post-op after CABG, on anticoagulation for ~2 years    PE (pulmonary embolism)     post-op after CABG, on anticoagulation for ~2 years    Prediabetes     Tubular adenoma 03/2015    colonoscopy       Past Surgical History:   Procedure Laterality Date    ACHILLES TENDON SURGERY      APPENDECTOMY      CHOLECYSTECTOMY      CORONARY ANGIOPLASTY WITH STENT PLACEMENT  2012  CORONARY ARTERY BYPASS GRAFT  2013    triple     CYST REMOVAL      both thumbs    PR REPAIR EYE BLOWOUT,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    Procedure: ORBIT ORIF;  Surgeon: Jannett Celestine, DDS;  Location: [facility] MAIN OR;  Service: OMFS    TONSILLECTOMY AND ADENOIDECTOMY        Social History     Tobacco Use    Smoking status: Never Smoker    Smokeless tobacco: Never Used   Substance Use Topics    Alcohol use: No      Family History   Problem Relation Age of Onset    Other Mother         "rare lung disease"    Aneurysm Mother         abdominal aortic aneurysm    Diabetes Type II Mother     Breast cancer Mother     Blood clots Mother     Stroke Father     Stroke Brother         x2    Thyroid cancer Daughter         Assessment:     Assessment   Patient Active Problem List    Diagnosis Date Noted    Pulmonary nodule 09/30/2014     Priority: High     CT 02/20/15:  Stable left lower lung 6 mm nodule.     Repeat 12/18 stable, no furhter imaging indicated       Prediabetes 09/30/2014     Priority: Medium    Morbid obesity 01/12/2014     Priority: Medium    Severe Obstructive sleep apnea 11/11/2013     Priority: Medium     Adherent with CPAP      Coronary atherosclerosis 03/27/2013     Priority: Medium     Coronary artery disease   CABG 2013.    PCI to LAD in 8/13.    Coronary angiogram February of 2015 at [facility] after she had chest pain and an abnormal stress test that revealed patent stent and 3/3 grafts patent. The ejection fraction was 60%.    Episode of atypical chest pain 04/2015, negative troponins, negative nuclear stress test.         Gastritis 09/30/2014     Priority: Low    Endometrial thickening on CT 10/31/2019     6/21- incidental finding during hospitalization      Carotid stenosis, bilateral 10/31/2019     6/21- incidental finding during hospitalization, >70%  9/21- Severe      Closed odontoid fracture with type III morphology and routine healing, subsequent encounter 10/25/2019    Environmental allergies 07/24/2018     Immunotherapy ~2010      Osteopenia 04/03/2018     DXA 10/2016 AP spine -0.2, L neck 1.1, R -1.3, total L -1.0, total R -0.9  05/2018 AP spine -0.2, L neck -2.1, total -1.3      Anxiety and depression 06/12/2017    Aortic stenosis 04/14/2017     Mild on echo 3/21, repeat in 75yr      Health care maintenance 01/01/2017     PCN allergy removed after negative allergy testing.       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            Plan:     Plan     Will contact PCP office regarding Eliquis. It appears there was a prescription sent recently.  Given concern for worsening PE or R heart failure given her new symptoms, recommend ER evaluation.  Family and PCP agree.  Will need to address B severe carotid artery stenoses, asymptomatic, once medically stable.    Randolm Idol, MD 02/09/2020

## 2020-02-09 NOTE — Telephone Encounter (Signed)
Covering for Dr. Laurance Flatten. If her shortness is significant, she should go to ED for further evaluation. If she has been off her eliquis for a week, then we would need to restart at 10 mg BID for a week and then have her go back to 5 mg BID, please let me know so I can send in a prescription.   Thanks  Nelta Numbers, MD

## 2020-02-09 NOTE — ED Provider Notes (Addendum)
History     Chief Complaint   Patient presents with    Shortness of Breath     74 yo female, history of CAD, PE (on Eliquis), recent odontoid T3 fracture, splenic laceration, and retroperitoneal hematoma who presents to the ED today with SOB. She ran out of her Eliquis last week. She has been experiencing exertional shortness of breath for one week. Usually happens when she walks to the elevator. No: chest pain, syncope, abdominal pain, nausea/vomiting, hemoptysis. She endorses a new, non-productive cough. She additionally endorses mild dysuria and frequency. No hematuria. No fever.            Medical/Surgical/Family History     Past Medical History:   Diagnosis Date    Allergic rhinitis 09/30/2014    Arthritis     Asthma     CAD (coronary artery disease)     DVT (deep venous thrombosis)     post-op after CABG, on anticoagulation for ~2 years    PE (pulmonary embolism)     post-op after CABG, on anticoagulation for ~2 years    Prediabetes     Tubular adenoma 03/2015    colonoscopy         Patient Active Problem List   Diagnosis Code    Severe Obstructive sleep apnea G47.33    Pulmonary nodule R91.1    Coronary atherosclerosis I25.10    Prediabetes R73.03    Gastritis K29.70    Morbid obesity E66.01    Dysthymia F34.1    Facial fracture due to fall S02.92XA, [id number]    Orbital floor fracture S02.30XA    Restless leg syndrome G25.81    Health care maintenance Z00.00    Aortic stenosis I35.0    Anxiety and depression F41.9, F32.9    Chronic obstructive pulmonary disease J44.9    Osteopenia M85.80    Environmental allergies Z91.09    Closed odontoid fracture with type III morphology and routine healing, subsequent encounter S12.120D    Endometrial thickening on CT R93.89    Carotid stenosis, bilateral I65.23    Acute chest pain R07.9    Aortic valve disorder I35.9    Asthma J45.909    Backache M54.9    Benign hypertensive heart disease without congestive heart failure I11.9    DVT  (deep venous thrombosis), right I82.401    Shortness of breath R06.02    HTN (hypertension) I10    Multiple-type hyperlipidemia E78.2    Pulmonary embolism I26.99    Tachycardia R00.0            Past Surgical History:   Procedure Laterality Date    ACHILLES TENDON SURGERY      APPENDECTOMY      CHOLECYSTECTOMY      CORONARY ANGIOPLASTY WITH STENT PLACEMENT  2012    CORONARY ARTERY BYPASS GRAFT  2013    triple     CYST REMOVAL      both thumbs    PR REPAIR EYE BLOWOUT,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    Procedure: ORBIT ORIF;  Surgeon: Jannett Celestine, DDS;  Location: [facility] MAIN OR;  Service: OMFS    TONSILLECTOMY AND ADENOIDECTOMY       Family History   Problem Relation Age of Onset    Other Mother         "rare lung disease"    Aneurysm Mother         abdominal aortic aneurysm    Diabetes Type II Mother     Breast cancer Mother  Blood clots Mother     Stroke Father     Stroke Brother         x2    Thyroid cancer Daughter           Social History     Tobacco Use    Smoking status: Never Smoker    Smokeless tobacco: Never Used   Substance Use Topics    Alcohol use: No    Drug use: No     Living Situation     Questions Responses    Patient lives with Spouse    Homeless No    Caregiver for other family member No    External Services None    Employment Retired    Curator Violence Risk                 Review of Systems   Review of Systems   Constitutional: Negative for fever.   HENT: Negative for sore throat.    Eyes: Negative for visual disturbance.   Respiratory: Positive for cough and shortness of breath.    Cardiovascular: Negative for chest pain.   Gastrointestinal: Negative for abdominal pain.   Genitourinary: Positive for dysuria and frequency.   Musculoskeletal: Negative for back pain.   Neurological: Negative for syncope and headaches.   Hematological: Negative for adenopathy.   Psychiatric/Behavioral: Negative for agitation.       Physical Exam     Triage Vitals  Triage Start: Start,  (02/09/20 1108)   First Recorded BP: 153/65, Resp: 18, Temp: 36.6 C (97.9 F), Temp src: TEMPORAL Oxygen Therapy SpO2: 95 %, Oximetry Source: Rt Hand, O2 Device: None (Room air), Heart Rate: 81, (02/09/20 1112)  .  First Pain Reported  0-10 Scale: 4, Pain Location/Orientation: Neck, (02/09/20 1112)       Physical Exam  Vitals and nursing note reviewed.   Constitutional:       General: She is not in acute distress.     Appearance: She is not ill-appearing, toxic-appearing or diaphoretic.   HENT:      Head: Normocephalic and atraumatic.   Cardiovascular:      Rate and Rhythm: Normal rate.      Heart sounds: Murmur heard.   No friction rub. No gallop.    Pulmonary:      Effort: Pulmonary effort is normal. No accessory muscle usage or respiratory distress.      Breath sounds: No decreased breath sounds, wheezing or rhonchi.   Abdominal:      Palpations: Abdomen is soft. There is no mass.      Tenderness: There is no abdominal tenderness. There is no guarding or rebound.   Skin:     General: Skin is warm and dry.      Capillary Refill: Capillary refill takes less than 2 seconds.   Neurological:      General: No focal deficit present.      Mental Status: She is alert.      Cranial Nerves: No cranial nerve deficit.      Motor: No weakness.         Medical Decision Making     Assessment:  74 yo female, history of CAD, PE (on Eliquis), recent odontoid T3 fracture, splenic laceration, and retroperitoneal hematoma who presents to the ED today with SOB. Concern for worsening PE in the setting of medication non-compliance (ran out of medication). Low concern for PNA, ACS, MSK, HF. We will order cbc, bmp, reorder Eliquis, UA.  Differential diagnosis:  See above    Plan:        Chest standard frontal and lateral views      Basic metabolic panel      CBC and differential      Urinalysis, dipstick only ([place])      EKG 12 lead      Saline lock IV      Eliquis        ED Course and Disposition:  Vital signs stable, labs  normal. Restarted Eliquis at 10 mg BID and discharged home.             Malcolm Metro, MD      Resident Attestation:    Patient seen by me on 02/09/2020.    I saw and evaluated the patient. I agree with the resident's/fellow's findings and plan of care as documented above.  Author:  Guss Bunde, MD       Malcolm Metro, MD  Resident  02/10/20 1025       Guss Bunde, MD  02/10/20 [phone]

## 2020-02-09 NOTE — Telephone Encounter (Signed)
Elmyra Ricks, NP with Vascular calling office    States pt was on Eliquis ~1 month ago for PE    States she has not been taking for approximately 1 week now d/t not being refilled. Worsening shortness of breath now.  Reorder pended if appropriate.    NP inquiring on next steps for urgent cardio appointments v ED advisement, encouraged a call to pt cardiologist Trilby Leaver, MD.    Please advise.

## 2020-02-09 NOTE — ED Notes (Signed)
Nursing Care Plan:  Will monitor and assess VS and pain scores every 2-4 hours and prn.  Perform frequent rounding prn.  Provide updates to patient and/or cargiver frequently.  Provide support to patient/caregiver as needed.  Teach patient and/or caregivers about patients needs/status working towards discharge.  Patient oriented to room and given call bell.

## 2020-02-09 NOTE — ED Triage Notes (Signed)
Pt presents to ED with SOB,. Pt dx with PE 1 month ago, was taken Eliquis. Pt ran out of medication, has not taken in 1 week. Pt saw PCP, sent pt in here     Triage Note   Marlyne Beards, RN

## 2020-02-09 NOTE — Discharge Instructions (Signed)
You were seen in the ED for shortness of breath. We performed lab work and a chest xray, all of which looked normal. We decided that you were safe to go home.     I have sent a prescription to your pharmacy for Eliquis. Please pick it up and take as prescribed.     Please follow up with your doctors.    Please come back to the ED if you have worsening shortness of breath, chest pain, abdominal pain, nausea/vomiting, dizziness/lightheadedness, loss of consciousness.

## 2020-02-15 ENCOUNTER — Ambulatory Visit: Payer: Medicare (Managed Care) | Attending: Primary Care | Admitting: Primary Care

## 2020-02-15 ENCOUNTER — Encounter: Payer: Self-pay | Admitting: Primary Care

## 2020-02-15 VITALS — BP 128/74 | HR 75 | Ht 67.99 in | Wt 252.0 lb

## 2020-02-15 DIAGNOSIS — R9389 Abnormal findings on diagnostic imaging of other specified body structures: Secondary | ICD-10-CM

## 2020-02-15 DIAGNOSIS — R0602 Shortness of breath: Secondary | ICD-10-CM | POA: Insufficient documentation

## 2020-02-15 DIAGNOSIS — I350 Nonrheumatic aortic (valve) stenosis: Secondary | ICD-10-CM | POA: Insufficient documentation

## 2020-02-15 DIAGNOSIS — N2889 Other specified disorders of kidney and ureter: Secondary | ICD-10-CM | POA: Insufficient documentation

## 2020-02-15 DIAGNOSIS — S12120D Other displaced dens fracture, subsequent encounter for fracture with routine healing: Secondary | ICD-10-CM | POA: Insufficient documentation

## 2020-02-15 DIAGNOSIS — Z23 Encounter for immunization: Secondary | ICD-10-CM | POA: Insufficient documentation

## 2020-02-15 DIAGNOSIS — S0230XA Fracture of orbital floor, unspecified side, initial encounter for closed fracture: Secondary | ICD-10-CM

## 2020-02-15 DIAGNOSIS — I2699 Other pulmonary embolism without acute cor pulmonale: Secondary | ICD-10-CM | POA: Insufficient documentation

## 2020-02-15 MED ORDER — APIXABAN 5 MG PO TABS *I*
5.0000 mg | ORAL_TABLET | Freq: Two times a day (BID) | ORAL | 1 refills | Status: DC
Start: 2020-02-15 — End: 2020-04-17

## 2020-02-15 NOTE — Progress Notes (Signed)
[facility]          SUBJECTIVE    Pt is here to discuss:    Chief Complaint   Patient presents with    Pulmonary embolism     patient here for follow up          1. SOB - on exertion during ADLs, present for a couple weeks. Occasional cough, no wheezing. Denies chest tightness or palpitations. Usually resolves with rest. Discussed this with vascular surgeon during [place] 9/29, and realized she had been off of eliquis due to difficulty with refill. Sent to ED where they did vitals and labs and d/c-ed home with Rx.    Denies pedal edema. +orthopnea  +8lb weight gain since visit here in August    Of note, repeat echo 12/2019 showed moderate to severe aortic stenosis, was mild per cardiology note 07/2019    2.Severe Carotid stenosis- needs to delay intervention x8mo to allow further odontoid fracture healing.      3. R eye pain - eyebrow area. At location of previous orbital fracture where they grafted/plated it. Pain and swelling persists, wants to make sure she didn't re injure her previous injury.     4. Neck pain - persists. Asking for PT referral for relief.    5. Endometrial thickening on CT - due for GYN [place], which was delayed at last visit      6. Abnormal Renal finding - had abnormal finding on L ureter on CT done during her June hospitalization, repeat CT in August was not done in correct protocol to revisualize it. had [place] was done last week:    Mild left collecting system pelviectasis which persists post void, these findings are similar to the prior CT studies.      No renal calculi are identified.         PMH / Family Hx / Social Hx  Patient's medications, allergies, problem list, past medical, social histories were reviewed and notable for:       Current Outpatient Medications   Medication Sig    tamsulosin (FLOMAX) 0.4 MG capsule Take 1 capsule (0.4 mg total) by mouth every evening    pantoprazole (PROTONIX) 40 MG EC tablet Take 1 tablet (40 mg total) by mouth daily (before breakfast)  Swallow  whole. Do not crush, break, or chew.    apixaban (ELIQUIS) 5 MG tablet Take 10 mg (2 tablets) BID x 7 days, then 5 mg (1 tablet) BID thereafter    acetaminophen (TYLENOL) 500 mg tablet Take 2 tablets (1,000 mg total) by mouth 3 times daily    senna (SENOKOT) 8.6 MG tablet Take 2 tablets by mouth daily    budesonide (PULMICORT) 0.25 MG/2ML nebulizer solution Inhale 0.25 mg into the lungs daily    montelukast (SINGULAIR) 10 MG tablet Take 1 tablet (10 mg total) by mouth nightly    sertraline (ZOLOFT) 100 MG tablet Take 1 tablet (100 mg total) by mouth daily Replacing 50mg  dose, please cancel other refills    fluticasone-umeclidinium-vilanterol (TRELEGY ELLIPTA) 200-62.5-25 MCG/INH diskus inhaler Inhale 1 puff into the lungs daily    gabapentin (GABAPENTIN) 300 MG capsule Take 1 capsule (300 mg total) by mouth 3 times daily    fluticasone (FLONASE) 50 MCG/ACT nasal spray 2 sprays by Nasal route daily    atorvastatin (LIPITOR) 40 MG tablet Take 40 mg by mouth daily (with dinner)    Cholecalciferol (VITAMIN D3) 5000 UNITS TABS Take 5,000 Units by mouth every morning  apixaban (ELIQUIS) 5 MG tablet Take 2 tablets (10 mg total) by mouth every 12 hours for 7 days    apixaban (ELIQUIS) 5 MG tablet Take 10 mg (2 tablets) BID x 7 days, then 5 mg (1 tablet) BID thereafter    ibuprofen (ADVIL,MOTRIN) 600 MG tablet Take 1 tablet (600 mg total) by mouth 3 times daily as needed for Pain    melatonin 3 MG Take 1 tablet (3 mg total) by mouth nightly as needed for Sleep (Patient not taking: Reported on 02/15/2020)    ARTIFICIAL TEARS (LUBRIFRESH PM,GENTEAL PM) ointment Please administer every 6 hours to both eyes    albuterol HFA (PROVENTIL, VENTOLIN, PROAIR HFA) 108 (90 Base) MCG/ACT inhaler Inhale 2 puffs into the lungs every 4-6 hours as needed    azelastine (ASTELIN) 0.1 % nasal spray 1-2 sprays by Nasal route 2 times daily    Spacer/Aero-Holding Chambers (EASIVENT) spacer Use twice daily with Symbicort  inhaler. Breathe in slowly.    Non-System Medication Medication/Supply: R thumb spica brace  Directions for Use: wear nightly    ipratropium-albuterol (DUONEB) 0.5-2.5mg  /63mL nebulizer solution Take 3 mLs by nebulization every 6 hours    ipratropium (ATROVENT) 0.06 % nasal spray 2 sprays by Each Nare route 3 times daily    Misc. Devices (CPAP) machine Download and compliance report          OBJECTIVE  Vitals:    02/15/20 1550   BP: 128/74   BP Location: Left arm   Patient Position: Sitting   Pulse: 75   SpO2: 95%   Weight: 114.3 kg (252 lb)   Height: 1.727 m (5' 7.99")     Body mass index is 38.33 kg/m.      General: well-appearing Caucasian female, pleasant & conversant, in NAD   Lungs: increased resp effort, sighing multiple times during visit. CTAB.  No crackles or wheezes.  Cardiac: RRR, +systolic murmur throughout. No pedal edema. Pulses 2+ b/l.     HENT: R eyebrow / superior orbital bone swelling and TTP   Psych: AAOx3, normal affect and mood. Insight and judgement intact.           ASSESSMENT & PLAN  1. SOB (shortness of breath)  2. Aortic valve stenosis, etiology of cardiac valve disease unspecified  3. Pulmonary embolism, unspecified chronicity, unspecified pulmonary embolism type, unspecified whether acute cor pulmonale present  - apixaban (ELIQUIS) 5 MG tablet; Take 1 tablet (5 mg total) by mouth every 12 hours  Dispense: 90 tablet; Refill: 1    Concern for aortic stenosis progression on recent echo , and the cause of current symptoms of exertional SOB and hypervolemia (as evidence of 8lb weight gain in 61mo). Doubtful that pt had/has recurrent DVT or PE. Back on eliquis, our office filled it going forward. Will discuss echo and symptoms with pt's cardiologist Dr Kerin Ransom and consider referral to cardiothoracic surgeon.    4. Endometrial thickening on ultrasound  - Gyn ultrasound scan; Future    5. Closed odontoid fracture with type III morphology and routine healing, subsequent encounter  - AMB  REFERRAL TO PHYSICAL / OCCUPATIONAL THERAPY    6. Orbital floor fracture  Will get imaging to r/o recurrent injury  - CT ORBIT, SELLA, FOSSA WITHOUT CONTRAST; Future    7. Pelviectasis     8. Immunization due   Discussed current data shows decreased vaccine effectiveness of Pfizer at 67months. Reviewed current guidelines recommend booster doses for those >19 years old due to increased risk  of hospitalization, and those > 43 years old with certain medical conditions (cancer, kidney disease, COPD, diabetes, heart disease, obesity, pregnancy) due to increased breakthrough infections.         Patient does  qualify for booster at this time. Encouraged continued social distancing and masking given current surge in our community.      - Covid-19 mRNA vaccine (PFIZER) IM 30 mcg/0.3 mL    9. Need for immunization against influenza  Pt denies current illness/fever, allergy to egg or flu vaccine component, h/o previous serious reaction to the flu vaccine, h/o Guillain-Barre. I have educated the patient and/or parent/guardian/designee about each component in all the immunizations and toxoids that are being given today, have reviewed potential vaccine side effects and have answered their questions about the vaccinations.       - Flu quad high dose >65 YO (high dose)          Follow-up: 69mo given complexity      [name], MD  [place]  02/15/2020  3:55 PM        ______________________

## 2020-02-15 NOTE — Patient Instructions (Addendum)
1)  [facility]  [phone]  Please call to schedule your ultrasound due to the thickening of the uterus seen during your hospitalization    2) Dr Laurance Flatten refilled the eliquis for 6 months  See Dr Donalee Citrin in November to talk about how long to take the eliquis    3) Dr Laurance Flatten noticed the aortic stenosis is slightly worse compared to march 2021. She will talk with Dr Kerin Ransom and may need to involve a heart surgeon to discuss this further.    4) we referred you to physical therapy    5) we gave your flu and COVID vaccines    6) Dr Laurance Flatten ordered a CT of the eye bones given your pain    [facility] [id number] option 4 please call to schedule your CT

## 2020-02-19 LAB — EKG 12-LEAD
P: 29 deg
PR: 248 ms
QRS: 39 deg
QRSD: 102 ms
QT: 393 ms
QTc: 443 ms
Rate: 76 {beats}/min
T: 72 deg

## 2020-02-28 ENCOUNTER — Encounter: Payer: Self-pay | Admitting: Primary Care

## 2020-02-28 DIAGNOSIS — R0600 Dyspnea, unspecified: Secondary | ICD-10-CM

## 2020-02-28 MED ORDER — ATORVASTATIN CALCIUM 40 MG PO TABS *I*
40.0000 mg | ORAL_TABLET | Freq: Every day | ORAL | 3 refills | Status: DC
Start: 2020-02-28 — End: 2020-07-20

## 2020-02-28 MED ORDER — MONTELUKAST SODIUM 10 MG PO TABS *I*
10.0000 mg | ORAL_TABLET | Freq: Every evening | ORAL | 3 refills | Status: DC
Start: 2020-02-28 — End: 2021-02-27

## 2020-02-28 NOTE — Telephone Encounter (Signed)
Last OV: 02/15/2020    Requested Prescriptions     Pending Prescriptions Disp Refills    atorvastatin (LIPITOR) 40 mg tablet 90 tablet 1     Sig: Take 1 tablet (40 mg total) by mouth daily (with dinner)    montelukast (SINGULAIR) 10 mg tablet 30 tablet 11     Sig: Take 1 tablet (10 mg total) by mouth nightly

## 2020-03-06 ENCOUNTER — Other Ambulatory Visit: Payer: Medicare (Managed Care)

## 2020-03-07 ENCOUNTER — Ambulatory Visit: Payer: Medicare (Managed Care) | Attending: Primary Care

## 2020-03-07 DIAGNOSIS — N8500 Endometrial hyperplasia, unspecified: Secondary | ICD-10-CM | POA: Insufficient documentation

## 2020-03-07 DIAGNOSIS — R9389 Abnormal findings on diagnostic imaging of other specified body structures: Secondary | ICD-10-CM | POA: Insufficient documentation

## 2020-03-09 ENCOUNTER — Ambulatory Visit
Admission: RE | Admit: 2020-03-09 | Discharge: 2020-03-09 | Disposition: A | Discharge status: Home / Self Care | Payer: Medicare (Managed Care) | Source: Ambulatory Visit

## 2020-03-09 ENCOUNTER — Ambulatory Visit
Admission: RE | Admit: 2020-03-09 | Discharge: 2020-03-09 | Disposition: A | Discharge status: Home / Self Care | Payer: Medicare (Managed Care) | Source: Ambulatory Visit | Attending: Neurosurgery | Admitting: Neurosurgery

## 2020-03-09 DIAGNOSIS — H5710 Ocular pain, unspecified eye: Secondary | ICD-10-CM | POA: Insufficient documentation

## 2020-03-09 DIAGNOSIS — W19XXXA Unspecified fall, initial encounter: Secondary | ICD-10-CM

## 2020-03-09 DIAGNOSIS — S0230XA Fracture of orbital floor, unspecified side, initial encounter for closed fracture: Secondary | ICD-10-CM

## 2020-03-09 DIAGNOSIS — S12110D Anterior displaced Type II dens fracture, subsequent encounter for fracture with routine healing: Secondary | ICD-10-CM

## 2020-03-09 DIAGNOSIS — S12120D Other displaced dens fracture, subsequent encounter for fracture with routine healing: Secondary | ICD-10-CM | POA: Insufficient documentation

## 2020-03-10 ENCOUNTER — Telehealth: Payer: Self-pay | Admitting: Primary Care

## 2020-03-10 NOTE — Telephone Encounter (Signed)
Called Dr. Bernita Raisin office, spoke with [employer]. He took down a note. Was able to confirm that they received notes from 02/15/20 appointment. Will pass along concerns to Dr. Kerin Ransom.

## 2020-03-10 NOTE — Telephone Encounter (Signed)
Spoke with [place]. Scheduled her for 03/24/2020 at 10:40 am for a uterine biopsy here with Dr. Laurance Flatten. States she has not heard from Cardiology yet.    Let Katheline know I'd be calling her cardiology team and to be anticipating a call from them.

## 2020-03-10 NOTE — Telephone Encounter (Signed)
Please call and review with pt that [place] is still abnormal. Not significantly abnormal, but not normal. I would like her to have a biopsy of the lining of the uterus. This is done with a longer pelvic exam and a straw that goes past the cervix into the uterus to obtain a sample. I can do the procedure here (24min appt please), or we can refer her to OBGYN    Can you also see if she ever heard from her cardiologist? If not, please call dr Ritter's office and alert them of my concern that her aortic stenosis is progressing and she is short of breath, and please refax my last office note.

## 2020-03-12 ENCOUNTER — Other Ambulatory Visit: Payer: Self-pay | Admitting: Primary Care

## 2020-03-12 DIAGNOSIS — F419 Anxiety disorder, unspecified: Secondary | ICD-10-CM

## 2020-03-12 DIAGNOSIS — F32A Depression, unspecified: Secondary | ICD-10-CM

## 2020-03-13 MED ORDER — GABAPENTIN 300 MG PO CAPSULE *I*
300.0000 mg | ORAL_CAPSULE | Freq: Three times a day (TID) | ORAL | 2 refills | Status: DC
Start: 2020-03-13 — End: 2020-05-17

## 2020-03-13 MED ORDER — SERTRALINE HCL 100 MG PO TABS *I*
100.0000 mg | ORAL_TABLET | Freq: Every day | ORAL | 3 refills | Status: DC
Start: 2020-03-13 — End: 2021-02-26

## 2020-03-13 NOTE — Telephone Encounter (Signed)
Last office visit:   02/15/2020  Patients upcoming appointments:  Future Appointments   Date Time Provider [place]   03/24/2020 10:40 AM [name], MD CAF None   03/28/2020  3:00 PM Sydnee Levans, MD VAM None   03/29/2020  8:15 AM MED [facility] PULMONARY LAB 3 [facility] [place] PULMONAR   03/29/2020  9:00 AM Parmenter, Serafina Mitchell, NP MPU [facility] PULMONAR   04/11/2020 11:30 AM Mable Paris, MD NS5 None   04/11/2020  1:30 PM Margaretha Glassing, MD USG None   04/17/2020  2:00 PM [name], MD CAF None   06/07/2020  1:00 PM Randolm Idol, MD VSS None       Recent Lab Values 01/06/2020   EXP DATE 02/09/2021       Recent Lab results:  GENERAL CHEMISTRY   Recent Labs     02/09/20  1156 01/04/20  0410 01/03/20  0134   NA 143 137 138   K 4.8 3.9 4.5   CL 104 101 102   CO2 30* 23 23   GAP _0 UN _1 CREAT 0.78 0.64 0.62   GFRC 75 89 90   GFRB 87 102 103   GLU 102* 116* 109*   CA 10.1 9.6 9.6      LIPID PROFILE   Recent Labs     08/11/19  1557   CHOL 132   TRIG 140   HDL 36*   LDLC 68      LIVER PROFILE   Recent Labs     10/25/19  2048 08/11/19  1557   ALT 39* 23   AST 57* 35   ALK 157* 129*   TB 0.2 0.4      DIABETES THYROID   Recent Labs     08/11/19  1557   HA1C 5.9*    No value within the past 365 days      Pending/Orders Labs:  Lab Frequency Next Occurrence   PULMONARY FUNCTION TEST Spirometry, DLCO Once 11/09/2019   US renal retroperitoneal complete Once 11/26/2019   CT Urogram without and with IV contrast Once 12/09/2019

## 2020-03-21 ENCOUNTER — Telehealth: Payer: Self-pay

## 2020-03-21 DIAGNOSIS — Z01812 Encounter for preprocedural laboratory examination: Secondary | ICD-10-CM

## 2020-03-21 NOTE — Telephone Encounter (Signed)
Writer spoke with [place] regarding Covid screen prior to appointment on 11/17. Information provided for [place] to be screened between 11/12 through 11/13.  Instructed to call clinic if unable to be screened so appointment can be rescheduled. Melburn Hake, RN

## 2020-03-24 ENCOUNTER — Ambulatory Visit: Payer: Medicare (Managed Care) | Attending: Primary Care | Admitting: Primary Care

## 2020-03-24 ENCOUNTER — Encounter: Payer: Self-pay | Admitting: Primary Care

## 2020-03-24 VITALS — BP 136/64 | HR 78 | Temp 97.0°F | Ht 67.99 in | Wt 257.0 lb

## 2020-03-24 DIAGNOSIS — R300 Dysuria: Secondary | ICD-10-CM | POA: Insufficient documentation

## 2020-03-24 DIAGNOSIS — Z20822 Contact with and (suspected) exposure to covid-19: Secondary | ICD-10-CM | POA: Insufficient documentation

## 2020-03-24 DIAGNOSIS — L858 Other specified epidermal thickening: Secondary | ICD-10-CM

## 2020-03-24 DIAGNOSIS — Z20828 Contact with and (suspected) exposure to other viral communicable diseases: Secondary | ICD-10-CM | POA: Insufficient documentation

## 2020-03-24 DIAGNOSIS — R9389 Abnormal findings on diagnostic imaging of other specified body structures: Secondary | ICD-10-CM | POA: Insufficient documentation

## 2020-03-24 DIAGNOSIS — Z01812 Encounter for preprocedural laboratory examination: Secondary | ICD-10-CM

## 2020-03-24 NOTE — Addendum Note (Signed)
Addended by: Vevelyn Pat on: 03/24/2020 03:30 PM     Modules accepted: Orders

## 2020-03-24 NOTE — Addendum Note (Signed)
Addended by: Vevelyn Pat on: 03/24/2020 04:10 PM     Modules accepted: Orders

## 2020-03-24 NOTE — Procedures (Signed)
Endometrial Biopsy Procedure Note    Pre-operative Diagnosis: endometrial thickening    Post-operative Diagnosis: same    Indications: endometrial thickening and heterogenicity on imaging (initially CTAP 12/2019 then [place] 02/2020)    UPT result: not indicated    Consent:  The risks, benefits and side effects of the procedure were explained to the patient; questions were answered and an informed consent was obtained.    Procedure Details    The patient was placed in the dorsal lithotomy position.  Bimanual exam showed the uterus to be in  an anteverted position.  A speculum was inserted in the vagina, and the cervix was visualized. The vagina and cervix were cleansed with povidone iodine.  If indicated, a single-toothed tenaculum was applied to the anterior lip of the cervix for stabilization.  The uterus was sounded to a depth of 7 cm.  EMB was done yielding a scant amount of tissue.  4 separate attempts were made without sufficient tissue encountered. This was sent for pathology for evaluation.The patient tolerated the procedure well and there were no complications.     Correct pt (2 identifiers)? Y  Correct procedure/site? Y  Correct materials? Y  Participants? Patient, Latoya Drilling, MD   Time started: 1105am,  Time ended: 1129am        Plan:  Minimal amount of tissue yielded with EMB attempt    The patient was advised that it is normal to have spotting for 1-2 days after the procedure. Call if heavy bleeding, severe pain, cramping or fever. She was advised to have nothing in the vagina for 3 days. She was advised to use Tylenol as needed for mild to moderate pain. The patient is to call to discuss the results and further management.     FU pending results  If nondiagnostic, will consult GYN if necessary to repeat EMB vs D&C vs serial monitoring.    Latoya Drilling, MD  [facility]  03/24/2020  1:01 PM

## 2020-03-24 NOTE — Addendum Note (Signed)
Addended by: Grandville Silos on: 03/24/2020 03:59 PM     Modules accepted: Orders

## 2020-03-26 LAB — COVID-19 NAAT (PCR): COVID-19 NAAT (PCR): NEGATIVE

## 2020-03-26 LAB — COVID-19 PCR

## 2020-03-27 ENCOUNTER — Encounter: Payer: Self-pay | Admitting: Primary Care

## 2020-03-27 LAB — AEROBIC CULTURE

## 2020-03-27 MED ORDER — SULFAMETHOXAZOLE-TRIMETHOPRIM 800-160 MG PO TABS *I*
1.0000 | ORAL_TABLET | Freq: Two times a day (BID) | ORAL | 0 refills | Status: AC
Start: 2020-03-27 — End: 2020-04-01

## 2020-03-28 ENCOUNTER — Ambulatory Visit: Payer: Medicare (Managed Care) | Attending: Hematology | Admitting: Hematology

## 2020-03-28 ENCOUNTER — Encounter: Payer: Self-pay | Admitting: Hematology

## 2020-03-28 VITALS — BP 147/70 | HR 98 | Temp 97.3°F | Resp 18 | Ht 65.95 in | Wt 254.2 lb

## 2020-03-28 DIAGNOSIS — I350 Nonrheumatic aortic (valve) stenosis: Secondary | ICD-10-CM | POA: Insufficient documentation

## 2020-03-28 DIAGNOSIS — I119 Hypertensive heart disease without heart failure: Secondary | ICD-10-CM | POA: Insufficient documentation

## 2020-03-28 DIAGNOSIS — R911 Solitary pulmonary nodule: Secondary | ICD-10-CM | POA: Insufficient documentation

## 2020-03-28 DIAGNOSIS — I2699 Other pulmonary embolism without acute cor pulmonale: Secondary | ICD-10-CM | POA: Insufficient documentation

## 2020-03-28 DIAGNOSIS — I359 Nonrheumatic aortic valve disorder, unspecified: Secondary | ICD-10-CM | POA: Insufficient documentation

## 2020-03-28 NOTE — Patient Instructions (Addendum)
Your care team is:  Dr. Deaundre Allston  Wendy McCarty, NP  Kasey McInerney, RN  Benita Shelley, secretary     The office phone number is: [phone]    This phone will be answered by a secretary who will take your message Monday - Friday, 8:00 am - 5:00 pm.   Please be prepared to let the secretary know why you are calling.  This will help let your nurse know how urgent the call is and how to direct it. Someone from the office will then call you back. We do not have direct phone lines to nursing staff.   Please call this number to schedule, cancel or reschedule any appointments in the office and infusion center.     This is also the same number that you should call after hours and on weekends/holidays for any urgent needs.  There is always a fellow on call and a weekend triage RN.    We do communicate through a secure online patient portal; MyChart. You may use this system to review results, medical history, office notes and appointments. You may also request prescription medications electronically. We ask that any urgent messages or concerns be communicated via telephone and not MyChart.    If you need to have blood work done, please have this done at a [facility] lab. The orders will be placed electronically. You do not need a paper requisition.   Please visit [email or web address] for a list of our lab locations and hours.    Please note that at this time we are still following a strict, no visitor policy in our infusion center and radiation. Please make arrangements accordingly and communicate any concerns related to this policy to your team.     On Tuesdays our office is in [address]- 2nd floor [facility]    On Wednesday mornings, our office is on the 5th floor of the ambulatory tower. Take the silver elevators off of the main hospital lobby to the 5th floor.    On Wednesday afternoons, our office is in [address]- 1st floor [facility]

## 2020-03-29 ENCOUNTER — Encounter: Payer: Self-pay | Admitting: Pulmonology

## 2020-03-29 ENCOUNTER — Ambulatory Visit: Payer: Medicare (Managed Care) | Attending: Pulmonology

## 2020-03-29 ENCOUNTER — Ambulatory Visit: Payer: Medicare (Managed Care) | Admitting: Pulmonology

## 2020-03-29 VITALS — BP 133/62 | HR 85 | Temp 97.5°F | Resp 18 | Ht 66.42 in | Wt 254.0 lb

## 2020-03-29 DIAGNOSIS — G4733 Obstructive sleep apnea (adult) (pediatric): Secondary | ICD-10-CM

## 2020-03-29 DIAGNOSIS — J45909 Unspecified asthma, uncomplicated: Secondary | ICD-10-CM

## 2020-03-29 DIAGNOSIS — J449 Chronic obstructive pulmonary disease, unspecified: Secondary | ICD-10-CM | POA: Insufficient documentation

## 2020-03-29 DIAGNOSIS — R0600 Dyspnea, unspecified: Secondary | ICD-10-CM | POA: Insufficient documentation

## 2020-03-29 DIAGNOSIS — R1310 Dysphagia, unspecified: Secondary | ICD-10-CM

## 2020-03-29 DIAGNOSIS — R911 Solitary pulmonary nodule: Secondary | ICD-10-CM | POA: Insufficient documentation

## 2020-03-29 LAB — PULMONARY FUNCTION TEST
DLCO Z-score: -0.13
DLCO: 20.34 mL/min/mmHg
FEV1 (L): 1.66 L
FEV1 Z-score: -1.59
FEV1/FVC Z-score: -1.24
FEV1/FVC: 67 %
FVC (L): 2.46 L
FVC Z-score: -1.01

## 2020-03-29 NOTE — Progress Notes (Addendum)
[facility]      Dear Dr. [name], MD,    I had the pleasure of seeing [name] on 03/29/2020 at the [facility] for  re-evaluation of shortness of breath, morbid obesity, asthma, and OSA.  Please allow me to review the past medical history for my records.    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 At that time, she was started on Trelegy.     She was started on Eliquis for PE in August. She presented to the ED on 02/09/20 with dyspnea after running out of Eliquis. Had an ECHO in August which showed progression of aortic stenosis-has as scheduled repeat ECHO next week.       She is more short of breath over last several months after her fall in June.  Dyspnea with exertion.  She denies dyspnea at rest.  She has not noticed much of an improvement in her breathing with trelegy. She uses albuterol on average about 2 times a week- for dyspnea and wheezing which does help.  She denies chest tightness.  She started coughing yesterday- dry cough. Denies heartburn.  She has been having issues swallowing- she is due for esophageal dilatation-she is having difficulty with pills. She reports compliance with CPAP- on average about 7-8 hours a night. She has not been prescribed prednisone for breath.     On review of systems, no denies recent fevers, chills, night sweats, weight loss; she denies exertional or pleuritic chest pain no nocturnal respiratory symptoms including paroxysmal dyspnea, cough, or wheezing. The rest  of the review of systems is negative.    A complete medication list was reviewed and reconciled.    Pertinent pulmonary medications include:Trelegy, albuterol prn, duoneb prn, CPAP    Allergic reactions were reviewed and updated.     PHYSICAL EXAM & DATA   Blood pressure 133/62, pulse 85, temperature 36.4 C (97.5 F), temperature source Temporal, resp. rate 18, height 1.687 m (5' 6.42"), weight 115.2 kg (254 lb), SpO2 95 %. on RA    Gen:   Well appearing, obese No distress, no cyanosis,   Neck:  No cervical or supraclavicular lymphadenopathy. No thyromegaly. No JVD.    Resp:  CTA blaterally   CV:    Regular S1S2, + murmur, no peripheral edema   Neuro: Alert and oriented X 3   SKIN: No rashes or skin lesions     Pulmonary function tests:  Spirometry today reveals an FEV1 of 1.66 liters (73% of predicted), an FVC of 2.46 liters (83 % of predicted) and a normal FEV1/FVC ratio of 67 %. Diffusing capacity is 20.34 ml/min/mmHg or  98% of predicted. These values are consistent with normal spirometry. The values have increased compared to previous testing in March 2021. The FEV1 and FVC increased by 14%  and 10% respectively.    Imaging studies since our last visit:  02/09/20  Chest Xray   1. Improved ventilation with mild bibasilar atelectasis.    2. No significant pleural fluid or pulmonary edema.    3. No clearly suspicious airspace opacities to suggest a source of infection.       CT angio  01/02/20  Incidentally visualized embolus in the proximal branch of the right upper lobe segmental pulmonary artery resulting in mild distention of the right main pulmonary artery without evidence of right heart strain.      The thoracic aorta is normal in course and caliber with mild atherosclerotic calcification and unchanged soft plaque in the descending thoracic aorta near the arch.      Previously noted presumed hematoma along the descending thoracic aorta at the level of the diaphragm is no longer appreciated on this study.       Stable 6 mm left lower lobe pulmonary nodule.      Please see separately dictated abdominal/pelvis CT report for more detailed findings below the diaphragm.      Findings discussed with Dr. Arsenio Loader over the phone at approximately 6:50 PM on 01/02/2020.      END OF IMPRESSION       Impression:  [name] is a 74 y.o. female with dyspnea/asthma, morbid obesity, and OSA following up. There has been an improvement in her PFTs since starting Trelegy.  PFTs are now normal.  Etiology of her worsening dyspnea is likely related to worsening stenosis.  She has a follow up next week with cardiology.  She should continue current inhalers. She was encouraged to contact GI regarding swallowing issues.  Reviewed chest CT with pt- she was concerned about 6 mm LLL nodule-there has been no change in this in several years- no need for further imagine.  She will return for follow up in 6-8 months- will repeat spiro at that time.      My recommendations are:   -Continue Trelegy and albuterol    -Follow up with cardiology as planned   -Follow up with GI   -Continue CPAP    -RTC in 6-8 months for follow up with Arlyce Harman.       Thank you for allowing me to remain involved in the care of [facility]. Please don't hesitate to contact me with any questions or concerns.     Sincerely,   Serafina Mitchell.Haneef Hallquist, NP  7:36 AM  03/29/2020    I personally delivered 60 minutes of services pre-, during, and after this visit on the date of the visit

## 2020-03-31 NOTE — Progress Notes (Signed)
Subjective:      Patient ID: [name] is a 74 y.o. female    HPI I had the pleasure of seeing [name] in the hematology clinic for recommendations on recent thrombosis.   [name] had a prolonged hospital stay in the ICU in June 2021, after a fall.   On October 25, 2019, [name] fell down 15 flights of stairs and sustained a type III odontoid fracture, grade 1 splenic laceration, scalp laceration, extrapleural hematoma and multiple rib fractures. These were managed conservatively.    [name] was also diagnosed with left retroperitoneal hematoma near the psoas muscle, there was no aortic extravasation.   The hospital stay was complicated by expanding left retroperitoneal hematoma and urinary retention that required foley placement.   Due to the multiple fractures, hematoma, splenic laceration, [name] did not receive medical DVT prophylaxis for the long stay in the ICU.   On January 02, 2020, and outpatient follow-up CT of the chest as part of a follow-up visit with vascular surgery revealed pulmonary embolism in the right upper lobe segmental pulmonary artery.   [name] was readmitted to the hospital and started on anticoagulant and subsequently discharged on apixaban.   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 This was treated with anticoagulant for 2 years.   [name] other medical history includes obstructive sleep apnea, type 2 diabetes, aortic stenosis.    Past Medical History:   Diagnosis Date    Allergic rhinitis 09/30/2014    Arthritis     Asthma     CAD (coronary artery disease)     DVT (deep venous thrombosis)     post-op after CABG, on anticoagulation for ~2 years    PE (pulmonary embolism)     post-op after CABG, on anticoagulation for ~2 years    Prediabetes     Tubular adenoma 03/2015    colonoscopy       Past Surgical History:   Procedure Laterality Date    ACHILLES TENDON SURGERY      APPENDECTOMY      CHOLECYSTECTOMY      CORONARY ANGIOPLASTY WITH STENT PLACEMENT  2012     CORONARY ARTERY BYPASS GRAFT  2013    triple     CYST REMOVAL      both thumbs    PR REPAIR EYE BLOWOUT,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    Procedure: ORBIT ORIF;  Surgeon: Jannett Celestine, DDS;  Location: [facility] MAIN OR;  Service: OMFS    TONSILLECTOMY AND ADENOIDECTOMY        Family History   Problem Relation Age of Onset    Other Mother         "rare lung disease"    Aneurysm Mother         abdominal aortic aneurysm    Diabetes Type II Mother     Breast cancer Mother     Blood clots Mother     Stroke Father     Stroke Brother         x2    Thyroid cancer Daughter       Social History     Socioeconomic History    Marital status: Widowed     Spouse name: Not on file    Number of children: Not on file    Years of education: Not on file    Highest education level: Not on file   Tobacco Use    Smoking status: Never Smoker    Smokeless tobacco:  Never Used   Substance and Sexual Activity    Alcohol use: No    Drug use: No    Sexual activity: Not on file   Other Topics Concern    Not on file   Social History Narrative    May 2016        Lives with [name] husband in an in law suite at [name] son's house        Three children, all live locally     Five grandchildren     Retired from school food service      Current Outpatient Medications   Medication    sulfamethoxazole-trimethoprim (BACTRIM DS,SEPTRA DS) 800-160 mg per tablet    gabapentin (GABAPENTIN) 300 mg capsule    sertraline (ZOLOFT) 100 mg tablet    atorvastatin (LIPITOR) 40 mg tablet    montelukast (SINGULAIR) 10 mg tablet    docusate sodium (COLACE) 100 MG capsule    polyethylene glycol (GLYCOLAX,MIRALAX) 17 g powder packet    apixaban (ELIQUIS) 5 MG tablet    tamsulosin (FLOMAX) 0.4 MG capsule    pantoprazole (PROTONIX) 40 MG EC tablet    acetaminophen (TYLENOL) 500 mg tablet    ibuprofen (ADVIL,MOTRIN) 600 MG tablet    senna (SENOKOT) 8.6 MG tablet    ARTIFICIAL TEARS (LUBRIFRESH PM,GENTEAL PM) ointment    budesonide (PULMICORT) 0.25  MG/2ML nebulizer solution    fluticasone-umeclidinium-vilanterol (TRELEGY ELLIPTA) 200-62.5-25 MCG/INH diskus inhaler    albuterol HFA (PROVENTIL, VENTOLIN, PROAIR HFA) 108 (90 Base) MCG/ACT inhaler    azelastine (ASTELIN) 0.1 % nasal spray    Spacer/Aero-Holding Chambers (EASIVENT) spacer    fluticasone (FLONASE) 50 MCG/ACT nasal spray    Non-System Medication    ipratropium-albuterol (DUONEB) 0.5-2.5mg  /26mL nebulizer solution    Misc. Devices (CPAP) machine    Cholecalciferol (VITAMIN D3) 5000 UNITS TABS    ipratropium (ATROVENT) 0.06 % nasal spray     No current facility-administered medications for this visit.     Allergies   Allergen Reactions    Morphine Hives     Has tolerated Percocet     Review of Systems   Constitutional: Positive for fatigue.   All other systems reviewed and are negative.    Objective:     Vitals:    03/28/20 1426   BP: 147/70   Pulse: 98   Resp: 18   Temp: 36.3 C (97.3 F)   Weight: 115.3 kg (254 lb 3.1 oz)   Height: 167.5 cm (5' 5.95")     Physical Exam  Constitutional:       Appearance: Normal appearance.   Eyes:      Extraocular Movements: Extraocular movements intact.      Conjunctiva/sclera: Conjunctivae normal.   Cardiovascular:      Rate and Rhythm: Normal rate and regular rhythm.      Pulses: Normal pulses.      Heart sounds: Murmur heard.       Pulmonary:      Effort: Pulmonary effort is normal.      Breath sounds: Normal breath sounds.   Abdominal:      Palpations: Abdomen is soft.   Musculoskeletal:         General: Normal range of motion.   Skin:     General: Skin is warm and dry.   Neurological:      General: No focal deficit present.      Mental Status: [name] is alert.   Psychiatric:  Behavior: Behavior normal.         Thought Content: Thought content normal.         Judgment: Judgment normal.           Lab results: 02/09/20  1156 01/04/20  0410 01/03/20  0134 11/08/19  2305 11/07/19  2346   WBC 8.0 10.0 11.5* 13.4* 13.2*   Hemoglobin 12.9 12.2 12.7 10.8*  10.3*   Hematocrit 41 39 40 35 32*   RBC 4.5 4.2 4.4 3.8* 3.6*   Platelets 312 307 331 438* 429*     01/02/2020 CT angio chest:   Incidentally visualized embolus in the proximal branch of the right upper lobe segmental pulmonary artery resulting in mild distention of the right main pulmonary artery without evidence of right heart strain.    The thoracic aorta is normal in course and caliber with mild atherosclerotic calcification and unchanged soft plaque in the descending thoracic aorta near the arch.    Previously noted presumed hematoma along the descending thoracic aorta at the level of the diaphragm is no longer appreciated on this study. Stable 6 mm left lower lobe pulmonary nodule. Please see separately dictated abdominal/pelvis CT report for more detailed findings below the diaphragm.    01/04/2020 Echo: Interpretation Summary  1.  Concentric LVH and left atrial enlargement  2.  Normal LVEF without regional wall motion abnormalities.   3.  Moderate to severe aortic valve stenosis   4.  Mitral valve sclerosis without significant stenosis  5.  Mild right ventricular enlargement/dysfunction with estimated mildly elevated pulmonary artery systolic pressure   6.  Mildly dilated ascending aorta    Assessment:    Right upper lobe pulmonary embolism   History of DVT and pulmonary embolism   Obstructive sleep apnea    I personally reviewed the CT chest that was completed in the subsequent imaging studies, these are notable for the presence of pulmonary vascular thrombosis in the right upper lobe. Clinically the patient has been doing relatively well on the apixaban without recurrent symptoms suggestive of recurrent pulmonary embolism. It is also reassuring that there has been no overt bleeding symptoms.     Recall that during [name] initial prolonged hospital stay in June 2021 [name] had multiple fractures as well as multiple areas of hematoma.  Given the overall history this thrombotic event was clearly provoked, as  was [name] previous DVT and pulmonary events that occurred after surgery.  In light of this I think completion of 3 months of anticoagulation with apixaban will be adequate after which [name] can discontinue the full anticoagulant.      I recommended limited thrombophilia studies for screening purposes, which I am hoping will be negative. [name] will then complete the anticoagulant at the end of November.    It would be reasonable for [name] to resume [name] previous Plavix for the cardiac indication.     The rationale for this recommendation was reviewed with the patient at length. [name] will follow-up with me on as-needed basis.      Sydnee Levans, MD      Orders Placed This Encounter   Procedures    Anti - cardiolipin antibody    Beta-2 Glycoprotein Antibodies    D-dimer, quantitative    Lipoprotein A (LPA)       Cc: [name], MD  [address]  [place],  [place] [address]      This medical documentation was created using an electronic medical record system  with Systems analyst.  Although this document has been carefully reviewed, there may still be some phonetic and typographical errors.  These areas are purely typographical and due to imperfections of the software program and do not reflect any compromise in the patient's medical care.

## 2020-04-03 ENCOUNTER — Other Ambulatory Visit: Payer: Medicare (Managed Care)

## 2020-04-03 LAB — SURGICAL PATHOLOGY

## 2020-04-11 ENCOUNTER — Ambulatory Visit: Payer: Medicare (Managed Care) | Admitting: Neurosurgery

## 2020-04-11 ENCOUNTER — Ambulatory Visit: Payer: Medicare (Managed Care) | Admitting: Urology

## 2020-04-17 ENCOUNTER — Encounter: Payer: Self-pay | Admitting: Primary Care

## 2020-04-17 ENCOUNTER — Ambulatory Visit: Payer: Medicare (Managed Care) | Admitting: Primary Care

## 2020-04-17 ENCOUNTER — Telehealth: Payer: Self-pay | Admitting: Student in an Organized Health Care Education/Training Program

## 2020-04-17 ENCOUNTER — Other Ambulatory Visit
Admission: RE | Admit: 2020-04-17 | Discharge: 2020-04-17 | Disposition: A | Discharge status: Home / Self Care | Payer: Medicare (Managed Care) | Source: Ambulatory Visit | Attending: Hematology | Admitting: Hematology

## 2020-04-17 VITALS — BP 132/78 | HR 78 | Temp 97.3°F | Ht 66.42 in | Wt 264.6 lb

## 2020-04-17 DIAGNOSIS — I350 Nonrheumatic aortic (valve) stenosis: Secondary | ICD-10-CM

## 2020-04-17 DIAGNOSIS — I2510 Atherosclerotic heart disease of native coronary artery without angina pectoris: Secondary | ICD-10-CM

## 2020-04-17 DIAGNOSIS — I2699 Other pulmonary embolism without acute cor pulmonale: Secondary | ICD-10-CM | POA: Insufficient documentation

## 2020-04-17 DIAGNOSIS — Z86718 Personal history of other venous thrombosis and embolism: Secondary | ICD-10-CM

## 2020-04-17 DIAGNOSIS — R519 Headache, unspecified: Secondary | ICD-10-CM

## 2020-04-17 DIAGNOSIS — R9389 Abnormal findings on diagnostic imaging of other specified body structures: Secondary | ICD-10-CM

## 2020-04-17 LAB — D-DIMER, QUANTITATIVE

## 2020-04-17 NOTE — Telephone Encounter (Signed)
Hematology/Oncology On-call Note    I returned a call received by the answering service regarding [employer].    In brief, Penelope Fittro is a 74 y.o.  female with hx of DVT/PE. Got a call from [facility]. The blood sample arrived there after 4hours so they weren't able to run test for d-dimer. I will forward this note to the primary Hem/Onc's care team to follow up in the morning or the next weekday.      [place]  Hematology Oncology Fellow, PGY-4  [place] [facility] [place]

## 2020-04-17 NOTE — Progress Notes (Signed)
[facility]            SUBJECTIVE    Pt is here to discuss:    No chief complaint on file.        1. R forehead pain/swelling - persistent. CT orbital in October neg for any acute change, other than interval improvement in previous swelling at time of fall.     2. Endometrial thickening- s/p EMB by me 11/12 with scant tissue encountered despite numerous attempts; pathology read atrophic endometrium    3. Dyspnea , aortic stenosis - seeing cardiology tomorrow. Additional 12lb weight gain since October. Seen by pulm who repeated PFTs which showed improvement, therefore DOE not felt to be pulmonary in origin    4. DVT, PE- completed 46month course of eliquis at end of November per hematology recommendation.      PMH / Family Hx / Social Hx  Patient's medications, allergies, problem list, past medical, social histories were reviewed and notable for:       Current Outpatient Medications   Medication Sig    gabapentin (GABAPENTIN) 300 mg capsule Take 1 capsule (300 mg total) by mouth 3 times daily    sertraline (ZOLOFT) 100 mg tablet Take 1 tablet (100 mg total) by mouth daily  Replacing 50mg  dose, please cancel other refills    atorvastatin (LIPITOR) 40 mg tablet Take 1 tablet (40 mg total) by mouth daily (with dinner)    montelukast (SINGULAIR) 10 mg tablet Take 1 tablet (10 mg total) by mouth nightly    docusate sodium (COLACE) 100 MG capsule Take 100 mg by mouth 2 times daily    tamsulosin (FLOMAX) 0.4 MG capsule Take 1 capsule (0.4 mg total) by mouth every evening    pantoprazole (PROTONIX) 40 MG EC tablet Take 1 tablet (40 mg total) by mouth daily (before breakfast)  Swallow whole. Do not crush, break, or chew.    acetaminophen (TYLENOL) 500 mg tablet Take 2 tablets (1,000 mg total) by mouth 3 times daily    senna (SENOKOT) 8.6 MG tablet Take 2 tablets by mouth daily    budesonide (PULMICORT) 0.25 MG/2ML nebulizer solution Inhale 0.25 mg into the lungs daily     fluticasone-umeclidinium-vilanterol (TRELEGY ELLIPTA) 200-62.5-25 MCG/INH diskus inhaler Inhale 1 puff into the lungs daily    fluticasone (FLONASE) 50 MCG/ACT nasal spray 2 sprays by Nasal route daily    Cholecalciferol (VITAMIN D3) 5000 UNITS TABS Take 5,000 Units by mouth every morning       polyethylene glycol (GLYCOLAX,MIRALAX) 17 g powder packet Take by mouth daily as needed    apixaban (ELIQUIS) 5 MG tablet Take 1 tablet (5 mg total) by mouth every 12 hours (Patient not taking: Reported on 04/17/2020)    ibuprofen (ADVIL,MOTRIN) 600 MG tablet Take 1 tablet (600 mg total) by mouth 3 times daily as needed for Pain    ARTIFICIAL TEARS (LUBRIFRESH PM,GENTEAL PM) ointment Please administer every 6 hours to both eyes    albuterol HFA (PROVENTIL, VENTOLIN, PROAIR HFA) 108 (90 Base) MCG/ACT inhaler Inhale 2 puffs into the lungs every 4-6 hours as needed    azelastine (ASTELIN) 0.1 % nasal spray 1-2 sprays by Nasal route 2 times daily    Spacer/Aero-Holding Chambers (EASIVENT) spacer Use twice daily with Symbicort inhaler. Breathe in slowly.    Non-System Medication Medication/Supply: R thumb spica brace  Directions for Use: wear nightly    ipratropium-albuterol (DUONEB) 0.5-2.5mg  /1mL nebulizer solution Take 3 mLs by nebulization every 6 hours  ipratropium (ATROVENT) 0.06 % nasal spray 2 sprays by Each Nare route 3 times daily    Misc. Devices (CPAP) machine Download and compliance report        Wt Readings from Last 3 Encounters:   04/17/20 120 kg (264 lb 9.6 oz)   03/29/20 115.2 kg (254 lb)   03/28/20 115.3 kg (254 lb 3.1 oz)       OBJECTIVE  Vitals:    04/17/20 1410   BP: 132/78   BP Location: Right arm   Patient Position: Sitting   Pulse: 78   Temp: 36.3 C (97.3 F)   SpO2: 96%   Weight: 120 kg (264 lb 9.6 oz)   Height: 1.687 m (5' 6.42")     Body mass index is 42.17 kg/m.      General: well-appearing Caucasian female, pleasant & conversant, in NAD  Lungs: Normal resp effort. CTAB.  No crackles or  wheezes.  Cardiac: RRR, +systolic murmur. no pedal edema.    Psych: AAOx3, normal affect and mood. Insight and judgement intact.           ASSESSMENT & PLAN  1. Facial pain  No bony deformity, suspect soft tissue bruising. Continue conservative management    2. Endometrial thickening on CT  Reviewed given lack of vaginal bleeding, cut off for biopsy is actually >21mm (as opposed to >50mm if bleeding). No need for repeat procedure or D&S    3. History of DVT (deep vein thrombosis)  S/p 41mo completion of anticoagulation for provoked DVT    4. Aortic valve stenosis, etiology of cardiac valve disease unspecified  FU as scheduled with cardiology this week.     5. Atherosclerosis of coronary artery, unspecified vessel or lesion type, unspecified whether angina present, unspecified whether native or transplanted heart  Labs ordered for next visit  - Hemoglobin A1c; Future  - Lipid Panel (Reflex to Direct  LDL if Triglycerides more than 400); Future  - Comprehensive metabolic panel; Future      Follow-up: 57months      [name], MD  [facility]  04/17/2020  2:18 PM        ______________________

## 2020-04-18 DIAGNOSIS — I2699 Other pulmonary embolism without acute cor pulmonale: Secondary | ICD-10-CM

## 2020-04-18 LAB — ANTI-CARDIOLIPIN AB
Anticardiolipin IgG: <1.6 [GPL'U]/mL (ref 0.0–19.0)
Anticardiolipin IgM: 1.9 [MPL'U]/mL (ref 0.0–19.0)

## 2020-04-18 LAB — BETA-2 GLYCOPROTEIN ANTIBODIES
Beta-2 Glyco 1 IgG: <1.4 U/mL (ref 0.0–19.0)
Beta-2 Glyco 1 IgM: 2.4 U/mL (ref 0.0–19.0)

## 2020-04-18 LAB — RESOLUTION

## 2020-04-18 NOTE — Telephone Encounter (Signed)
Left message for [name] stating that her d-dimer was not able to be run as she had it done at a satellite lab, and asked if she is able to do it sometime when she is at [facility]. Will send My Chart message with this information as well.

## 2020-04-20 ENCOUNTER — Ambulatory Visit: Payer: Medicare (Managed Care) | Attending: Primary Care | Admitting: Primary Care

## 2020-04-20 ENCOUNTER — Ambulatory Visit: Payer: Medicare (Managed Care) | Admitting: Urology

## 2020-04-20 ENCOUNTER — Encounter: Payer: Self-pay | Admitting: Primary Care

## 2020-04-20 VITALS — HR 82 | Temp 98.4°F

## 2020-04-20 DIAGNOSIS — Z20828 Contact with and (suspected) exposure to other viral communicable diseases: Secondary | ICD-10-CM | POA: Insufficient documentation

## 2020-04-20 DIAGNOSIS — Z20822 Contact with and (suspected) exposure to covid-19: Secondary | ICD-10-CM | POA: Insufficient documentation

## 2020-04-20 DIAGNOSIS — R300 Dysuria: Secondary | ICD-10-CM | POA: Insufficient documentation

## 2020-04-20 DIAGNOSIS — R059 Cough, unspecified: Secondary | ICD-10-CM

## 2020-04-20 LAB — URINALYSIS WITH MICROSCOPIC
Bacteria,UA: NONE SEEN
Blood,UA: NEGATIVE
Glucose,UA: NEGATIVE
Ketones, UA: NEGATIVE
Nitrite,UA: NEGATIVE
Protein,UA: NEGATIVE
Specific Gravity,UA: 1.011 (ref 1.002–1.030)
pH,UA: 7.5 (ref 5.0–8.0)

## 2020-04-20 NOTE — Progress Notes (Signed)
[facility]    SUBJECTIVE    Pt is here to discuss:    Chief Complaint   Patient presents with    Cough         1. Nasal congestion, cough started Tuesday 12/7  Wheezing today  No fevers, chills, diarrhea, vomiting, loss of taste/smell, body aches, ST    Mild h/a     2. Dysuria , frequency - requesting urine test to see if UTI has returned    PMH / Family Hx / Social Hx  Patient's medications, allergies, problem list, past medical, social histories were reviewed and notable for:       Current Outpatient Medications   Medication Sig    gabapentin (GABAPENTIN) 300 mg capsule Take 1 capsule (300 mg total) by mouth 3 times daily    sertraline (ZOLOFT) 100 mg tablet Take 1 tablet (100 mg total) by mouth daily  Replacing 50mg  dose, please cancel other refills    atorvastatin (LIPITOR) 40 mg tablet Take 1 tablet (40 mg total) by mouth daily (with dinner)    montelukast (SINGULAIR) 10 mg tablet Take 1 tablet (10 mg total) by mouth nightly    docusate sodium (COLACE) 100 MG capsule Take 100 mg by mouth 2 times daily    polyethylene glycol (GLYCOLAX,MIRALAX) 17 g powder packet Take by mouth daily as needed    tamsulosin (FLOMAX) 0.4 MG capsule Take 1 capsule (0.4 mg total) by mouth every evening    pantoprazole (PROTONIX) 40 MG EC tablet Take 1 tablet (40 mg total) by mouth daily (before breakfast)  Swallow whole. Do not crush, break, or chew.    acetaminophen (TYLENOL) 500 mg tablet Take 2 tablets (1,000 mg total) by mouth 3 times daily    ibuprofen (ADVIL,MOTRIN) 600 MG tablet Take 1 tablet (600 mg total) by mouth 3 times daily as needed for Pain    senna (SENOKOT) 8.6 MG tablet Take 2 tablets by mouth daily    ARTIFICIAL TEARS (LUBRIFRESH PM,GENTEAL PM) ointment Please administer every 6 hours to both eyes    budesonide (PULMICORT) 0.25 MG/2ML nebulizer solution Inhale 0.25 mg into the lungs daily    fluticasone-umeclidinium-vilanterol (TRELEGY ELLIPTA) 200-62.5-25 MCG/INH diskus inhaler Inhale  1 puff into the lungs daily    albuterol HFA (PROVENTIL, VENTOLIN, PROAIR HFA) 108 (90 Base) MCG/ACT inhaler Inhale 2 puffs into the lungs every 4-6 hours as needed    azelastine (ASTELIN) 0.1 % nasal spray 1-2 sprays by Nasal route 2 times daily    Spacer/Aero-Holding Chambers (EASIVENT) spacer Use twice daily with Symbicort inhaler. Breathe in slowly.    fluticasone (FLONASE) 50 MCG/ACT nasal spray 2 sprays by Nasal route daily    Non-System Medication Medication/Supply: R thumb spica brace  Directions for Use: wear nightly    ipratropium-albuterol (DUONEB) 0.5-2.5mg  /76mL nebulizer solution Take 3 mLs by nebulization every 6 hours    ipratropium (ATROVENT) 0.06 % nasal spray 2 sprays by Each Nare route 3 times daily    Misc. Devices (CPAP) machine Download and compliance report    Cholecalciferol (VITAMIN D3) 5000 UNITS TABS Take 5,000 Units by mouth every morning          Immunization History   Administered Date(s) Administered    Covid-19 mRNA vaccine (PFIZER) IM 30 mcg/0.78mL 07/16/2019, 07/16/2019, 08/06/2019, 08/06/2019, 02/15/2020    Influenza Quadrivalent 0.34mL prefilled syringe/single dose vial(FluLaval,Fluzone,Afluria,Fluarix) 03/27/2013, 03/16/2018    Influenza, High Dose 05/25/2014, 02/28/2015, 03/21/2016, 02/28/2017    Influenza, High Dose Quad(PF)>49yr 02/03/2019, 02/15/2020  Pneumococcal 13-valent Conjugate (Prevnar 13) 09/14/2015    Pneumococcal Polysaccharide (Pneumovax) 09/20/2016    Tdap 10/25/2019       Sister in law with recent bronchitis, unsure if COVID tested        OBJECTIVE  Vitals:    04/20/20 1202   Pulse: 82   Temp: 36.9 C (98.4 F)   SpO2: 97%     There is no height or weight on file to calculate BMI.      General: well-appearing Caucasian female, pleasant & conversant, in NAD  Eyes:. PERRLA. EOMI. Conjunctiva pink, without swelling or exudate. Lids normal.   ENT: Moist mucous membranes. No tonsillar, palatal or buccal lesions/abnormalities. Normal lips and dentition.  B/L TMs grey, without bulging or erythema.   Neck: Trachea midline. Thyroid nontender, without nodularity or enlargement.   Lymph: No cervical or supraclavicular lymphadenopathy.  Lungs: Normal resp effort. CTAB.  No crackles or wheezes.  Cardiac: RRR, no M/R/G. No pedal edema. Pulses 2+ b/l.     Psych: AAOx3, normal affect and mood. Insight and judgement intact.           ASSESSMENT & PLAN    1. Cough  - Influenza A PCR; Future  - Influenza B PCR; Future  - RSV PCR; Future  - COVID-19 PCR; Future    Recommended testing as above to rule out COVID, RSV, flu etiologies. Reminded to quarantine until results available.     Recommended conservative care with rest, fluids, NSAIDs/APAP, honey (in tea or throat lozenges), mucinex for mucolytic purposes, delsym for cough suppression.       Reviewed warning signs of sinusitis and instructed pt to call or return if new fever, purulent nasal drainage, sinus or tooth pain.    Also reviewed s/s of bronchitis and pneumonia and instructed to call or return in increasing SOB, ever and purulent sputum    2. Dysuria  - Urinalysis with microscopic; Future  - Aerobic culture (Urine catheter); Future           [name], MD  [facility]  04/20/2020  12:28 PM        ______________________

## 2020-04-20 NOTE — Addendum Note (Signed)
Addended by: Vevelyn Pat on: 04/20/2020 05:22 PM     Modules accepted: Orders

## 2020-04-21 LAB — COVID-19 PCR

## 2020-04-21 LAB — RSV PCR: RSV PCR: 0

## 2020-04-21 LAB — LIPOPROTEIN A (LPA): Lipoprotein a: 101 mg/dL — ABNORMAL HIGH (ref <=29)

## 2020-04-21 LAB — INFLUENZA A: Influenza A PCR: 0

## 2020-04-21 LAB — COVID-19 NAAT (PCR): COVID-19 NAAT (PCR): NEGATIVE

## 2020-04-21 LAB — INFLUENZA B PCR: Influenza B PCR: 0

## 2020-04-22 ENCOUNTER — Encounter: Payer: Self-pay | Admitting: Primary Care

## 2020-04-22 LAB — AEROBIC CULTURE

## 2020-04-22 MED ORDER — CEPHALEXIN 500 MG PO CAPS *I*
500.0000 mg | ORAL_CAPSULE | Freq: Two times a day (BID) | ORAL | 0 refills | Status: AC
Start: 2020-04-22 — End: 2020-04-27

## 2020-05-09 ENCOUNTER — Ambulatory Visit: Payer: Medicare (Managed Care) | Admitting: Neurosurgery

## 2020-05-09 ENCOUNTER — Encounter: Payer: Self-pay | Admitting: Gastroenterology

## 2020-05-09 ENCOUNTER — Encounter: Payer: Self-pay | Admitting: Neurosurgery

## 2020-05-09 DIAGNOSIS — S12120D Other displaced dens fracture, subsequent encounter for fracture with routine healing: Secondary | ICD-10-CM

## 2020-05-09 NOTE — Progress Notes (Signed)
ROUGH DRAFT - see letter with the same date as encounter for finalized evaluation      [name], MD, [facility] in [place], [place] and [place], [place]  [place], [place] [address]  [phone] - main  [phone] - fax    May 09, 2020    RE:  [name] [name], 1945-09-10    Dear Latoya Drilling, MD,    I had the pleasure of seeing [name] today in follow up for a type III odontoid fracture.     [name] is a 74 year old female with a PMH of CAD, aortic stenosis, CABG on plavix, OSA, and DM who presented as level 2 trauma after fall down 15 stairs on 10/25/19. She was found to have a type III odontoid fx, grade 1 splenic laceration, scalp laceration, extrapleural hematoma and multiple rib fractures. Neurosurgery was consulted for a type III odontoid fracture with extension into right lateral mass and possible injury to the anterior longitudinal ligament. She was placed in a cervical collar for management initially which has since been discontinued. She presents in follow up with an updated CT of her cervical spine for review. She denies any neck pain today but she does report some stiffness. She denies any new numbness, tingling, weakness, fevers, changes in urine or bowels.     Past Medical History:   Past Medical History:   Diagnosis Date    Allergic rhinitis 09/30/2014    Arthritis     Asthma     CAD (coronary artery disease)     DVT (deep venous thrombosis)     post-op after CABG, on anticoagulation for ~2 years    PE (pulmonary embolism)     post-op after CABG, on anticoagulation for ~2 years    Prediabetes     Tubular adenoma 03/2015    colonoscopy      Past Surgical History:   Past Surgical History:   Procedure Laterality Date    ACHILLES TENDON SURGERY      APPENDECTOMY      CHOLECYSTECTOMY      CORONARY ANGIOPLASTY WITH STENT PLACEMENT  2012    CORONARY ARTERY BYPASS GRAFT  2013    triple      CYST REMOVAL      both thumbs    PR REPAIR EYE BLOWOUT,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    Procedure: ORBIT ORIF;  Surgeon: Jannett Celestine, DDS;  Location: [facility] MAIN OR;  Service: OMFS    TONSILLECTOMY AND ADENOIDECTOMY       Allergies:   Allergies   Allergen Reactions    Morphine Hives     Has tolerated Percocet       Medications:  Current Outpatient Medications   Medication    gabapentin (GABAPENTIN) 300 mg capsule    sertraline (ZOLOFT) 100 mg tablet    atorvastatin (LIPITOR) 40 mg tablet    montelukast (SINGULAIR) 10 mg tablet    docusate sodium (COLACE) 100 MG capsule    polyethylene glycol (GLYCOLAX,MIRALAX) 17 g powder packet    tamsulosin (FLOMAX) 0.4 MG capsule    pantoprazole (PROTONIX) 40 MG EC tablet    acetaminophen (TYLENOL) 500 mg tablet    ibuprofen (ADVIL,MOTRIN) 600 MG tablet    senna (SENOKOT) 8.6 MG tablet    ARTIFICIAL TEARS (LUBRIFRESH PM,GENTEAL PM) ointment    budesonide (PULMICORT) 0.25 MG/2ML nebulizer solution    fluticasone-umeclidinium-vilanterol (TRELEGY ELLIPTA) 200-62.5-25 MCG/INH diskus inhaler  albuterol HFA (PROVENTIL, VENTOLIN, PROAIR HFA) 108 (90 Base) MCG/ACT inhaler    azelastine (ASTELIN) 0.1 % nasal spray    Spacer/Aero-Holding Chambers (EASIVENT) spacer    fluticasone (FLONASE) 50 MCG/ACT nasal spray    Non-System Medication    ipratropium-albuterol (DUONEB) 0.5-2.5mg  /86mL nebulizer solution    ipratropium (ATROVENT) 0.06 % nasal spray    Misc. Devices (CPAP) machine    Cholecalciferol (VITAMIN D3) 5000 UNITS TABS     No current facility-administered medications for this visit.     Social History:  Social History     Socioeconomic History    Marital status: Widowed     Spouse name: Not on file    Number of children: Not on file    Years of education: Not on file    Highest education level: Not on file   Tobacco Use    Smoking status: Never Smoker    Smokeless tobacco: Never Used   Substance and Sexual Activity    Alcohol use: No    Drug  use: No    Sexual activity: Not on file   Other Topics Concern    Not on file   Social History Narrative    May 2016        Lives with her husband in an in law suite at her son's house        Three children, all live locally     Five grandchildren     Retired from school food service     Family History:  Family History   Problem Relation Age of Onset    Other Mother         "rare lung disease"    Aneurysm Mother         abdominal aortic aneurysm    Diabetes Type II Mother     Breast cancer Mother     Blood clots Mother     Stroke Father     Stroke Brother         x2    Thyroid cancer Daughter        Review of Symptoms:  Twelve systems have been reviewed and pertinent positives noted in HPI.  All other systems negative:  Constitutional symptoms not present  Eyes not present  Ears, Nose, Mouth, Throat not present  Cardiovascular not present  Respiratory not present  Gastrointestinal not present  Genitourinary not present  Musculoskeletal not present  Neurological not present  Psychiatric not present  Integumentary not present  Endocrine not present  Hematologic/Lymphatic not present  Allergic/Immunologic not present    Physical Exam:  Well-developed and in no acute distress.  Eyes:  EOMI, sclera clear  Neck:  Supple.  Non-tender.   Cardiovascular:  No cyanosis, good capillary refill.  Pulmonary:  Clear, even, and easy respirations.  Skin:  Warm, dry, and pink.   Musculoskeletal:  Range of motion full.  Normal tone.  No atrophy.  No fasciculations.  Focused neurological exam was performed:    Alert and oriented to person, place and time.  Speech is clear and fluent.  Cranial nerves II-XII intact.    Strength: Full power throughout.  Sensation grossly intact throughout.   Gait is steady.      Imaging:  CT cervical spine without contrast  Result Date: 03/09/2020  Further interval healing of known type III odontoid fracture with solid callus bridging the previously noted fracture line.   Healing compression  fracture of the T2 superior endplate.  Stable multilevel degenerative changes  in the cervical spine as detailed above, resulting in mild spinal canal stenosis at C5-C6 and multilevel foraminal narrowing predominantly on the left.  Stable mild degenerative height loss of the C5 and C6 vertebral bodies.  Stable mild degenerative retrolisthesis of C3 on C4.  Mild straightening of the cervical lordosis, likely positional.  Extensive atherosclerotic calcifications in bilateral carotid bulbs and proximal cervical internal carotid arteries with retropharyngeal course of bilateral proximal cervical ICAs.   END OF IMPRESSION    Impression/Plan: 74 y.o. female with a known type III odontoid fracture. Repeat CT shows further interval healing of known fracture. Neuro stable. No further imaging required at this point as she is no longer experiencing pain. She may follow up on an as needed basis in the future.     Visit was 25 minutes, greater than 50% of the time was spent counseling/coordinating care.    Thank you for this opportunity to care for your patient.    Sincerely,    Tavie Haseman, FNP-C     A [facility], [facility], and [facility] member practice      cc:

## 2020-05-15 ENCOUNTER — Encounter: Payer: Self-pay | Admitting: Primary Care

## 2020-05-16 ENCOUNTER — Ambulatory Visit: Payer: Medicare (Managed Care) | Admitting: Primary Care

## 2020-05-16 ENCOUNTER — Ambulatory Visit
Admission: RE | Admit: 2020-05-16 | Discharge: 2020-05-16 | Disposition: A | Discharge status: Home / Self Care | Payer: Medicare (Managed Care) | Source: Ambulatory Visit

## 2020-05-16 ENCOUNTER — Encounter: Payer: Self-pay | Admitting: Primary Care

## 2020-05-16 VITALS — BP 128/68 | HR 74 | Ht 66.42 in | Wt 248.0 lb

## 2020-05-16 DIAGNOSIS — R079 Chest pain, unspecified: Secondary | ICD-10-CM

## 2020-05-16 DIAGNOSIS — M5489 Other dorsalgia: Secondary | ICD-10-CM

## 2020-05-16 DIAGNOSIS — I350 Nonrheumatic aortic (valve) stenosis: Secondary | ICD-10-CM

## 2020-05-16 DIAGNOSIS — M8580 Other specified disorders of bone density and structure, unspecified site: Secondary | ICD-10-CM

## 2020-05-16 DIAGNOSIS — S2232XA Fracture of one rib, left side, initial encounter for closed fracture: Secondary | ICD-10-CM

## 2020-05-16 NOTE — Progress Notes (Signed)
[facility]            SUBJECTIVE    Pt is here to discuss:    Chief Complaint   Patient presents with    Back Pain     patient here for back pain 7/10         1. Mid back pain - bra line, midline, no radiation, worsening x3 weeks. Radiates to front of her chest with exertion. Not worsened by lying flat or twisting, but feels uncomfortable at all times, like she has to shift but cannot get comfortable.     Attempted tylenol only       PMH / Family Hx / Social Hx  Patient's medications, allergies, problem list, past medical, social histories were reviewed and notable for:    Current Outpatient Medications   Medication Sig    clopidogrel (PLAVIX) 75 mg tablet     gabapentin (GABAPENTIN) 300 mg capsule Take 1 capsule (300 mg total) by mouth 3 times daily    sertraline (ZOLOFT) 100 mg tablet Take 1 tablet (100 mg total) by mouth daily  Replacing 50mg  dose, please cancel other refills    atorvastatin (LIPITOR) 40 mg tablet Take 1 tablet (40 mg total) by mouth daily (with dinner)    montelukast (SINGULAIR) 10 mg tablet Take 1 tablet (10 mg total) by mouth nightly    docusate sodium (COLACE) 100 MG capsule Take 100 mg by mouth 2 times daily    polyethylene glycol (GLYCOLAX,MIRALAX) 17 g powder packet Take by mouth daily as needed    tamsulosin (FLOMAX) 0.4 MG capsule Take 1 capsule (0.4 mg total) by mouth every evening    pantoprazole (PROTONIX) 40 MG EC tablet Take 1 tablet (40 mg total) by mouth daily (before breakfast)  Swallow whole. Do not crush, break, or chew.    senna (SENOKOT) 8.6 MG tablet Take 2 tablets by mouth daily    budesonide (PULMICORT) 0.25 MG/2ML nebulizer solution Inhale 0.25 mg into the lungs daily    fluticasone-umeclidinium-vilanterol (TRELEGY ELLIPTA) 200-62.5-25 MCG/INH diskus inhaler Inhale 1 puff into the lungs daily    azelastine (ASTELIN) 0.1 % nasal spray 1-2 sprays by Nasal route 2 times daily    fluticasone (FLONASE) 50 MCG/ACT nasal spray 2 sprays by Nasal  route daily    Cholecalciferol (VITAMIN D3) 5000 UNITS TABS Take 5,000 Units by mouth every morning       acetaminophen (TYLENOL) 500 mg tablet Take 2 tablets (1,000 mg total) by mouth 3 times daily    ibuprofen (ADVIL,MOTRIN) 600 MG tablet Take 1 tablet (600 mg total) by mouth 3 times daily as needed for Pain    ARTIFICIAL TEARS (LUBRIFRESH PM,GENTEAL PM) ointment Please administer every 6 hours to both eyes    albuterol HFA (PROVENTIL, VENTOLIN, PROAIR HFA) 108 (90 Base) MCG/ACT inhaler Inhale 2 puffs into the lungs every 4-6 hours as needed    Spacer/Aero-Holding Chambers (EASIVENT) spacer Use twice daily with Symbicort inhaler. Breathe in slowly.    Non-System Medication Medication/Supply: R thumb spica brace  Directions for Use: wear nightly    ipratropium-albuterol (DUONEB) 0.5-2.5mg  /50mL nebulizer solution Take 3 mLs by nebulization every 6 hours    ipratropium (ATROVENT) 0.06 % nasal spray 2 sprays by Each Nare route 3 times daily    Misc. Devices (CPAP) machine Download and compliance report     Had repeat echo by cardiology 12/8, remains moderate aortic stenosis  Not seeing cardiology until 1/14    ROS  Interval  weight loss with diet changes  Wt Readings from Last 3 Encounters:   05/16/20 112.5 kg (248 lb)   04/17/20 120 kg (264 lb 9.6 oz)   03/29/20 115.2 kg (254 lb)     Cough and SOB persist, no improvement    OBJECTIVE  Vitals:    05/16/20 1531   BP: 128/68   BP Location: Left arm   Patient Position: Sitting   Pulse: 74   SpO2: 98%   Weight: 112.5 kg (248 lb)   Height: 1.687 m (5' 6.42")     Body mass index is 39.53 kg/m.      General: well-appearing Caucasian female, pleasant & conversant, in NAD   Lungs: Normal resp effort. CTAB.  No crackles or wheezes.  Cardiac: RRR, no M/R/G. No pedal edema.    MSK:no spinal process tenderness of t spine, no paravertebral muscle tenderness.   Psych: AAOx3, normal affect and mood. Insight and judgement intact.           ASSESSMENT & PLAN  1. Interscapular  pain  - *Chest standard frontal and lateral views; Future  - Spine thoracic 3 views; Future  2. Chest pain, unspecified type  3. Aortic valve stenosis, etiology of cardiac valve disease unspecified    Pain does not appear musculoskeletal on exam today (no spasm, spinal processes nontender)  Given radiation to front of chest, concern that this may be a symptom of her aortic stenosis  Echo reviewed, no progression compared to 12/2019 but I still feel this is causing her dyspnea and now possibly her chest and back pain    Will get CXR to r/o any acute aortic pathology. Reviewed s/s of aortic dissection which would warrant immediate ED eval.     Will get spinal xrays to r/o compression fracture    If negative, will attempt to get cardiology referral bumped up to eval for additional stress testing or aortic imaging, and/or treatment plan for aortic stenosis    Follow-up: pending imaging      [name], MD  [facility]  05/16/2020  3:37 PM        ______________________    Addendum:  Ho[place] Brunette normal    Thoracic spine:  IMPRESSION/FINDINGS:      The patient is post median sternotomy. Multiple surgical clips project over the cardiomediastinal silhouette.     The upper thoracic vertebra are suboptimally evaluated due to soft tissue attenuation from the shoulders. No acute osseous abnormality is present. Vertebral body heights are maintained. Chronic left posterior second rib fracture. There is mild diffuse   osteopenia.     END OF IMPRESSION       CXR:  No acute disease. No change in size of the aorta.      Will have nursing communicate with cardiology tomorrow and request urgent appt this week    [name], MD  [facility]  05/16/2020  6:30 PM

## 2020-05-17 ENCOUNTER — Telehealth: Payer: Self-pay

## 2020-05-17 ENCOUNTER — Other Ambulatory Visit: Payer: Self-pay | Admitting: Primary Care

## 2020-05-17 MED ORDER — GABAPENTIN 300 MG PO CAPSULE *I*
300.0000 mg | ORAL_CAPSULE | Freq: Three times a day (TID) | ORAL | 2 refills | Status: DC
Start: 2020-05-17 — End: 2021-03-06

## 2020-05-17 NOTE — Telephone Encounter (Signed)
Last office visit:   05/16/2020  Patients upcoming appointments:  Future Appointments   Date Time Provider [place]   05/18/2020  8:45 AM Margaretha Glassing, MD USG None   06/07/2020 10:45 AM Randolm Idol, MD VSS None   07/17/2020  2:00 PM [name], MD CAF None   09/27/2020 11:15 AM MED [facility] PULMONARY LAB 3 [facility] [place] PULMONAR   09/27/2020 11:30 AM Parmenter, Serafina Mitchell, NP [facility] PULMONAR       Recent Lab Values 01/06/2020   EXP DATE 02/09/2021       Recent Lab results:  GENERAL CHEMISTRY   Recent Labs     02/09/20  1156 01/04/20  0410 01/03/20  0134   NA 143 137 138   K 4.8 3.9 4.5   CL 104 101 102   CO2 30* 23 23   GAP '9 13 13   ' UN '19 12 15   ' CREAT 0.78 0.64 0.62   GFRC 75 89 90   GFRB 87 102 103   GLU 102* 116* 109*   CA 10.1 9.6 9.6      LIPID PROFILE   Recent Labs     08/11/19  1557   CHOL 132   TRIG 140   HDL 36*   LDLC 68      LIVER PROFILE   Recent Labs     10/25/19  2048 08/11/19  1557   ALT 39* 23   AST 57* 35   ALK 157* 129*   TB 0.2 0.4      DIABETES THYROID   Recent Labs     08/11/19  1557   HA1C 5.9*    No value within the past 365 days      Pending/Orders Labs:  Lab Frequency Next Occurrence   US renal retroperitoneal complete Once 11/26/2019   CT Urogram without and with IV contrast Once 12/09/2019   Hemoglobin A1c Once 07/16/2020   Lipid Panel (Reflex to Direct  LDL if Triglycerides more than 400) Once 07/16/2020   Comprehensive metabolic panel Once 31/54/0086   D-dimer, quantitative Once 04/18/2020

## 2020-05-17 NOTE — Telephone Encounter (Signed)
-----   Message from [name], MD sent at 05/16/2020  6:33 PM EST -----  Please call cardiologist office (Dr Kerin Ransom) and request an urgent visit THIS WEEK for patientHaving interscapular pain and occasional exertional chest pain x3 weeks. Xrays neg for aortic pathology or compression fracture. Her aortic stenosis on echo last month is not progressed since 12/2019 but is moderate and I suspect causing her dysponea she's had for months. This pain however is new and unique. Can they evaluate her if she needs additional aortic imaging or stress testing, and make a plan for her aortic stenosis? Please send my note.

## 2020-05-17 NOTE — Telephone Encounter (Signed)
Spoke with Dr. Joya Gaskins office this morning and was able to have patients appointment moved up to tomorrow at 1:15pm. Writer called and confirmed with patient that this time was okay and she verbalized that it was okay. Informed her that the appointment was already scheduled with that office. Patient verbalized understanding

## 2020-05-18 ENCOUNTER — Encounter: Payer: Self-pay | Admitting: Gastroenterology

## 2020-05-18 ENCOUNTER — Ambulatory Visit: Payer: Medicare (Managed Care) | Admitting: Urology

## 2020-05-18 ENCOUNTER — Encounter: Payer: Self-pay | Admitting: Urology

## 2020-05-18 DIAGNOSIS — R935 Abnormal findings on diagnostic imaging of other abdominal regions, including retroperitoneum: Secondary | ICD-10-CM

## 2020-05-18 NOTE — Progress Notes (Signed)
HPI:  [name] is a 75 y.o. female   Here with daughter [name]  Was in [facility]  [phone]  Now home      Seen 10/26/2019 at [facility]  Presented as level 2 trauma, following fall down steps    She was found to have right scalp hematoma, left parietal scalp laceration, type III odontoid fracture, left 2 and 5-6 rib fxs, small splenic laceration, and periaortic fat stranding.     Pt became hypotensive with CT scan due to periaortic fat stranding previously seen. Vascular surgery consulted and determined there was no concern for aortic injury and no need for intervention from their standpoint. Pt received 2 L of IVF bolus and 2 unit RBC. Pt then became hypertensive with systolic BP 841-660Y and patient received hydralazine. Pt reports having some left sided pain but improved with recent pain medication administration.     Denies hematuria or history of kidney/bladder issues.   Cr 0.77  Hematocrit 38 (33, s/p 2 unit RBC)     CT Angio abdomen and pelvis today shows stable fat stranding/hematoma near the upper abdominal aorta. Increased hematoma in the left retroperitoneum and abutting the psoas muscle." Wall thickening of the proximal to mid ureter without contrast extravasation, mild associated left hydronephrosis". Stable 3 cm hematoma adjacent to pancreatic tail, small hematoma along left lower abdominal wall laterally.     Low concern for ureteral injury, and with paraaortic stranding, the thought was this created stranding around left kidney and tracking down ureteral plane.     plan was for repeat images [place] 12/03/2019 done at nursing home was normal (reviewed)      was CT angio abdomen pelvis pelvis 12/28/2019  CT urogram ordered to be done at the same time. This was NOT performed as requested with urogram component    CT angio shows a less prominent left ureter in prior area of concern     Incidentally visualized embolus in the proximal branch of the right upper lobe segmental  pulmonary   Was on eliquis , now stopped   She is on PLAVIX now  ------------  She had e. Coli uti 11/20221 and 04/2020        ---------  with a history of CAD s/p CABG (2012) and stenting (2015, on plavix), asthma, COPD and OSA       PMH:         Past Medical History:   Diagnosis Date    Allergic rhinitis 09/30/2014    Arthritis     Asthma     CAD (coronary artery disease)     DVT (deep venous thrombosis)     post-op after CABG, on anticoagulation for ~2 years    PE (pulmonary embolism)     post-op after CABG, on anticoagulation for ~2 years    Prediabetes     Tubular adenoma 03/2015    colonoscopy        PSH:        Past Surgical History:   Procedure Laterality Date    ACHILLES TENDON SURGERY      APPENDECTOMY      CHOLECYSTECTOMY      CORONARY ANGIOPLASTY WITH STENT PLACEMENT      CORONARY ARTERY BYPASS GRAFT  2013    triple     CYST REMOVAL      both thumbs    PR REPAIR EYE BLOWOUT,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    Procedure: ORBIT ORIF;  Surgeon: Jannett Celestine, DDS;  Location: [facility] MAIN OR;  Service: OMFS    TONSILLECTOMY AND ADENOIDECTOMY       Current Outpatient Medications   Medication    gabapentin (GABAPENTIN) 300 mg capsule    clopidogrel (PLAVIX) 75 mg tablet    sertraline (ZOLOFT) 100 mg tablet    atorvastatin (LIPITOR) 40 mg tablet    montelukast (SINGULAIR) 10 mg tablet    docusate sodium (COLACE) 100 MG capsule    polyethylene glycol (GLYCOLAX,MIRALAX) 17 g powder packet    tamsulosin (FLOMAX) 0.4 MG capsule    pantoprazole (PROTONIX) 40 MG EC tablet    acetaminophen (TYLENOL) 500 mg tablet    ibuprofen (ADVIL,MOTRIN) 600 MG tablet    senna (SENOKOT) 8.6 MG tablet    ARTIFICIAL TEARS (LUBRIFRESH PM,GENTEAL PM) ointment    budesonide (PULMICORT) 0.25 MG/2ML nebulizer solution    fluticasone-umeclidinium-vilanterol (TRELEGY ELLIPTA) 200-62.5-25 MCG/INH diskus inhaler    albuterol HFA (PROVENTIL, VENTOLIN, PROAIR HFA) 108 (90 Base) MCG/ACT  inhaler    azelastine (ASTELIN) 0.1 % nasal spray    Spacer/Aero-Holding Chambers (EASIVENT) spacer    fluticasone (FLONASE) 50 MCG/ACT nasal spray    Non-System Medication    ipratropium-albuterol (DUONEB) 0.5-2.5mg  /49mL nebulizer solution    ipratropium (ATROVENT) 0.06 % nasal spray    Misc. Devices (CPAP) machine    Cholecalciferol (VITAMIN D3) 5000 UNITS TABS     No current facility-administered medications for this visit.       Allergies:         Allergies   Allergen Reactions    Morphine Hives     Has tolerated Percocet            Social History:   Social History           Socioeconomic History    Marital status: Widowed     Spouse name: Not on file    Number of children: Not on file    Years of education: Not on file    Highest education level: Not on file   Tobacco Use    Smoking status: Never Smoker    Smokeless tobacco: Never Used   Substance and Sexual Activity    Alcohol use: No    Drug use: No    Sexual activity: Not on file   Other Topics Concern    Not on file   Social History Narrative    May 2016        Lives with her husband in an in law suite at her son's house        Three children, all live locally     Five grandchildren     Retired from school food service       Family Hx:        Family History   Problem Relation Age of Onset    Other Mother         "rare lung disease"    Aneurysm Mother         abdominal aortic aneurysm    Diabetes Type II Mother     Breast cancer Mother     Blood clots Mother     Stroke Father     Stroke Brother         x2    Thyroid cancer Daughter            ROS    General: Alert but sleepy after pain medication, appears stated age and cooperative, resting comfortably in bed  Chest: Unlabored respirations   Abdomen:  Soft, non-distended, non-tender, no suprapubic tenderness, no guarding or rigidity. No ecchymosis noted to abdomen or left flank.   GENITOURINARY: foley in place draining clear yellow urine.  Psych:  Affect pleasant, makes eye contact.     PE  .vs  LMP  (LMP Unknown)   LMP  (LMP Unknown)    General appearance - alert, well appearing, and in no distress  Eyes - sclera anicteric    Chest - normal respiratory effort        Extremities - no pedal edema                 Assessment:  [name] is a 75 y.o. female with presented 11/25/2019 as level II trauma following fall down steps yesterday.     She was found to have right scalp hematoma, left parietal scalp laceration, type III odontoid fracture, left 2 and 5-6 rib fxs, small splenic laceration, and periaortic fat stranding.     CT Angio abdomen and pelvis today shows stable fat stranding/hematoma near the upper abdominal aorta. Increased hematoma in the left retroperitoneum and abutting the psoas muscle, no injury evident to kidney. Wall thickening of the proximal to mid ureter without contrast extravasation, contrast present in ureter distal to area of thickening. Stable 3 cm hematoma adjacent to pancreatic tail, small hematoma along left lower abdominal wall laterally.       Low concern for ureteral injury, and with paraaortic stranding, the thought was this created stranding around left kidney and tracking down ureteral plane.     plan was for repeat images plan was for repeat images [place] 12/03/2019 done at nursing home was normal (reviewed)    was CT angio abdomen pelvis pelvis 12/28/2019  CT urogram ordered to be done at the same time. This was NOT performed as requested with urogram component    CT angio shows a less prominent left ureter in prior area of concern       We discussed that our preference was to get urogrma study which was not done. \    Plan was for [place] , but also not done    Will get CT urogram now to evaluate      -----------------          Plan:    Ct urogram, bmp  Video visit after          Margaretha Glassing, MD    All notes, labs, and images referenced in the note, were independently reviewed.

## 2020-05-19 ENCOUNTER — Other Ambulatory Visit: Payer: Self-pay | Admitting: Primary Care

## 2020-05-19 NOTE — Telephone Encounter (Signed)
Prescribed by her pulmonologist - please have script sent there

## 2020-05-25 ENCOUNTER — Encounter: Payer: Self-pay | Admitting: Gastroenterology

## 2020-05-31 ENCOUNTER — Other Ambulatory Visit: Payer: Self-pay | Admitting: Pulmonology

## 2020-06-01 ENCOUNTER — Other Ambulatory Visit: Payer: Self-pay | Admitting: Gastroenterology

## 2020-06-01 MED ORDER — FLUTICASONE-UMECLIDIN-VILANT 200-62.5-25 MCG/INH IN AEPB *I*
1.0000 | INHALATION_SPRAY | Freq: Every day | RESPIRATORY_TRACT | 5 refills | Status: DC
Start: 2020-06-01 — End: 2020-12-13

## 2020-06-02 ENCOUNTER — Telehealth: Payer: Self-pay | Admitting: Primary Care

## 2020-06-02 NOTE — Telephone Encounter (Signed)
Pt seen in ED last night after falling in snow and landing on her face    Please FU on how she's feeling? Any bruising? Any headache, dizziness, nausea/vomiting?    Would recommend she take it very easy this weekend, in case she suffered concussion. Would avoid a lot of stimulation, noise, activity. Ideally she should avoid TV/phone for 24 hours , to allow brain rest. If she does things (ex watch TV, read, talk with visitors) i'd advise doing them in short intervals (32min) then assessing if she feels ok. If any h/a or nausea develop, she should listen to her body, and take a break. This allows brain to recover faster. If she pushes herself too hard/too fast, she could prolong her recovery from a concussion    [name], MD  [facility]  06/02/2020  6:55 AM

## 2020-06-02 NOTE — Telephone Encounter (Signed)
Spoke to patient in regards to ED visit last evening. Per Patient she has a mild headache, denies n/v.  Per Patient she has  bruising on right side of  forehead. She did not have stitches placed but has a small area that is oozing. Recommended to continue to keep it clean and replace bandaid. Instructed her on Dr. Tawanna Sat recommendations for rest. Verbalized understanding.

## 2020-06-05 ENCOUNTER — Telehealth: Payer: Self-pay | Admitting: Urology

## 2020-06-05 NOTE — Telephone Encounter (Signed)
Spoke to the patient and she is all st for her CT scan     [name], [name] MRN: [id number]    Date: 06/08/2020 Status: Sch   Time: 2:00 PM Length: 30   Visit Type: CT Trixie Deis [[id number]] Copay: $0.00   Provider: RIS, Jagual (CT2) Department: [facility] IMG CT SCAN     Address: [place] [address]      CT UROGRAM WOW   Patient Instructions: You may not have anything to eat or drink 4 hours prior to your exam. You may take your medications with a sip of water.

## 2020-06-07 ENCOUNTER — Ambulatory Visit: Payer: Medicare (Managed Care) | Attending: Vascular Surgery | Admitting: Vascular Surgery

## 2020-06-07 ENCOUNTER — Encounter: Payer: Self-pay | Admitting: Vascular Surgery

## 2020-06-07 ENCOUNTER — Ambulatory Visit
Admission: RE | Admit: 2020-06-07 | Discharge: 2020-06-07 | Disposition: A | Discharge status: Home / Self Care | Payer: Medicare (Managed Care) | Source: Ambulatory Visit

## 2020-06-07 ENCOUNTER — Encounter: Payer: Self-pay | Admitting: Gastroenterology

## 2020-06-07 ENCOUNTER — Other Ambulatory Visit: Payer: Self-pay | Admitting: Vascular Surgery

## 2020-06-07 VITALS — BP 112/70 | Ht 67.0 in | Wt 246.0 lb

## 2020-06-07 DIAGNOSIS — I6529 Occlusion and stenosis of unspecified carotid artery: Secondary | ICD-10-CM

## 2020-06-07 DIAGNOSIS — I6523 Occlusion and stenosis of bilateral carotid arteries: Secondary | ICD-10-CM

## 2020-06-07 LAB — CV US CAROTID BILATERAL
Left Carotid Bulb EDV: 17.47 cm/s
Left Carotid Bulb PSV: 65.81 cm/s
Left Common Carotid Artery EDV Dist: 13.07 cm/s
Left Common Carotid Artery EDV Prox: 28.43 cm/s
Left Common Carotid Artery PSV Dist: 60.32 cm/s
Left Common Carotid Artery PSV Prox: 120.48 cm/s
Left External Carotid Artery EDV: 14.37 cm/s
Left External Carotid Artery PSV: 117.74 cm/s
Left ICA/CCA Ratio: 6.6
Left Internal Carotid Artery EDV Dist: 23.47 cm/s
Left Internal Carotid Artery EDV Mid: 63.95 cm/s
Left Internal Carotid Artery EDV Prox: 161.71 cm/s
Left Internal Carotid Artery PSV Dist: 51.39 cm/s
Left Internal Carotid Artery PSV Mid: 178.44 cm/s
Left Internal Carotid Artery PSV Prox: 397.91 cm/s
Left Subclavian Artery EDV: 0 cm/s
Left Subclavian Artery PSV: 114.83 cm/s
Left Vertebral Artery EDV: 29.41 cm/s
Left Vertebral Artery PSV: 77.88 cm/s
Right Carotid Bulb EDV: 19.69 cm/s
Right Carotid Bulb PSV: 52.69 cm/s
Right Common Carotid Artery EDV Dist: 15.8 cm/s
Right Common Carotid Artery EDV Prox: 19.7 cm/s
Right Common Carotid Artery PSV Dist: 50.76 cm/s
Right Common Carotid Artery PSV Prox: 59.89 cm/s
Right External Carotid Artery EDV: 16.11 cm/s
Right External Carotid Artery PSV: 163.16 cm/s
Right ICA/CCA Ratio: 11.19
Right Internal Carotid Artery EDV Dist: 30.07 cm/s
Right Internal Carotid Artery EDV Mid: 21.43 cm/s
Right Internal Carotid Artery EDV Prox: 219.89 cm/s
Right Internal Carotid Artery PSV Dist: 61.18 cm/s
Right Internal Carotid Artery PSV Mid: 88.96 cm/s
Right Internal Carotid Artery PSV Prox: 568.17 cm/s
Right Subclavian Artery EDV: 13.27 cm/s
Right Subclavian Artery PSV: 79.6 cm/s
Right Vertebral Artery EDV: 21 cm/s
Right Vertebral Artery PSV: 58.59 cm/s

## 2020-06-08 ENCOUNTER — Ambulatory Visit
Admission: RE | Admit: 2020-06-08 | Discharge: 2020-06-08 | Disposition: A | Discharge status: Home / Self Care | Payer: Medicare (Managed Care) | Source: Ambulatory Visit | Attending: Urology | Admitting: Urology

## 2020-06-08 DIAGNOSIS — N1330 Unspecified hydronephrosis: Secondary | ICD-10-CM | POA: Insufficient documentation

## 2020-06-08 DIAGNOSIS — K760 Fatty (change of) liver, not elsewhere classified: Secondary | ICD-10-CM | POA: Insufficient documentation

## 2020-06-08 DIAGNOSIS — R935 Abnormal findings on diagnostic imaging of other abdominal regions, including retroperitoneum: Secondary | ICD-10-CM

## 2020-06-08 DIAGNOSIS — I77811 Abdominal aortic ectasia: Secondary | ICD-10-CM | POA: Insufficient documentation

## 2020-06-08 DIAGNOSIS — K5730 Diverticulosis of large intestine without perforation or abscess without bleeding: Secondary | ICD-10-CM | POA: Insufficient documentation

## 2020-06-08 LAB — POCT CREATININE
Creatinine, POCT: 0.7 mg/dL (ref 0.51–0.95)
GFR,Black POC: 99 *
GFR,Other POC: 86 *

## 2020-06-08 MED ORDER — IOHEXOL 350 MG/ML (OMNIPAQUE) IV SOLN *I*
1.0000 mL | Freq: Once | INTRAVENOUS | Status: AC
Start: 2020-06-08 — End: 2020-06-08
  Administered 2020-06-08: 149 mL via INTRAVENOUS

## 2020-06-08 NOTE — Discharge Instructions (Signed)
Discharge Instructions for Patients receiving   Contrast Medium    06/08/2020  2:06 PM    INFORMATION  I.V. contrast is eliminated through the kidneys.  Oral and rectal contrasts are not absorbed by the body and will be eliminated through the gastrointestinal tract.  Side effects and allergic reactions to contrast mediums are not common but can occur up to 48 hours after your scan.    INSTRUCTIONS   1. You will need to drink four 8 oz glasses of fluid over the next four hours, unless your medical condition does not allow you to do this.  Please speak with an [facility] if you have any questions or concerns about this.  2. Mild headache may occur following contrast injection.  3. The kidneys excrete the contrast.  Your urine will NOT become discolored.  4. You may experience loose stools/diarrhea for approximately 24 hours after drinking oral contrast.  5. You may experience watery stool immediately after rectal contrast.  6.  If you are a DIABETIC and take Actos Plus Met, Actos Plus Met XR, Avandamet, Foramet, Glucophage, Glucophage XR, Glucovance, Glumetza, Janumet, Janumet XR, Jentadueto, [place], Kombiglyze XR, [place], [place], [place], [place], Xigduo XR or Metformin check with your doctor for management of your diabetes during this time.  Your doctor will have to address the need to hold this medication or the need for kidney function blood tests.   7.  There is no evidence that the contrast you have received would be harmful to your breastfed child.  If you choose, you may pump and discard your breast milk for the next 24 hours.    Delayed effects from contrast are not common.  However, notify your doctor IMMEDIATELY:    If nausea or vomiting occurs   If any itching, hives, wheezing, or rashes occur   Severe or throbbing headache    Please call [facility] for questions or concerns regarding your procedure.  Monday-Friday from 7am-10pm call [phone]. The Emergency  Department is open 24 hours a day if emergency treatment is required.

## 2020-06-12 ENCOUNTER — Encounter: Payer: Self-pay | Admitting: Vascular Surgery

## 2020-06-12 NOTE — Progress Notes (Signed)
Vascular Surgery Outpatient Clinic Follow-Up Visit    HPI:     [name] is a 75 y.o. female with PMH significant for CAD s/p CABG, history of DVT/PE following CABG, who is seen in the Vascular Surgery outpatient clinic for follow up of carotid artery stenosis.    [name] was seen in clinic about 4 months ago.  She was initially consulted by Vascular Surgery was she was at the hospital following a fall.  There was concern for aortic injury on imaging.  CTA imaging completed at time of visit showed no evidence of injury.  She was also consulted for severe stenosis bilaterally of the carotid arteries.  At the time, given that patient remained asymptomatic and was still recovering from the fall.      Today, [name] continues to deny any signs or symptoms of stroke or TIA including amaurosis fugax, incoherent speech, facial droop, or unilateral weakness/paralysis.  She was seen in the ED about one week ago after a fall with injury to the head.  Additionally she was seen by cardiology and because of noted DOE is scheduled to undergo nuclear stress test in the coming days.  She remains on antiplatelet and statin therapy.      Patient's medications, allergies, and medical, surgical, family, and social histories were updated, as appropriate, in eRecord during today's office visit.    REVIEW OF SYSTEMS  ROS - Pertinent items noted in HPI.    MEDICAL HISTORY  Past Medical History:   Diagnosis Date    Allergic rhinitis 09/30/2014    Arthritis     Asthma     CAD (coronary artery disease)     DVT (deep venous thrombosis)     post-op after CABG, on anticoagulation for ~2 years    PE (pulmonary embolism)     post-op after CABG, on anticoagulation for ~2 years    Prediabetes     Tubular adenoma 03/2015    colonoscopy        SURGICAL HISTORY  Past Surgical History:   Procedure Laterality Date    ACHILLES TENDON SURGERY      APPENDECTOMY      CHOLECYSTECTOMY      CORONARY ANGIOPLASTY WITH STENT PLACEMENT   2012    CORONARY ARTERY BYPASS GRAFT  2013    triple     CYST REMOVAL      both thumbs    PR REPAIR EYE BLOWOUT,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 She has never used smokeless tobacco. She reports that she does not drink alcohol and does not use drugs.     ALLERGIES  Morphine     MEDICATIONS  Current Outpatient Medications  Medication Sig    fluticasone-umeclidinium-vilanterol (TRELEGY ELLIPTA) 200-62.5-25 MCG/INH diskus inhaler Inhale 1 puff into the lungs daily    gabapentin (GABAPENTIN) 300 mg capsule Take 1 capsule (300 mg total) by mouth 3 times daily    clopidogrel (PLAVIX) 75 mg tablet     sertraline (ZOLOFT) 100 mg tablet Take 1 tablet (100 mg total) by mouth daily  Replacing 50mg  dose, please cancel other refills    atorvastatin (LIPITOR) 40 mg tablet Take 1 tablet (40 mg total) by mouth daily (with dinner)    montelukast (SINGULAIR) 10 mg tablet Take 1 tablet (10 mg total) by mouth nightly    docusate sodium (COLACE) 100 MG capsule Take 100 mg by mouth 2 times daily    polyethylene glycol (GLYCOLAX,MIRALAX) 17 g powder packet Take by mouth daily as needed    tamsulosin (FLOMAX) 0.4 MG capsule Take 1 capsule (0.4 mg total) by mouth every evening    pantoprazole (PROTONIX) 40 MG EC tablet Take 1 tablet (40 mg total) by mouth daily (before breakfast)  Swallow whole. Do not crush, break, or chew.    acetaminophen (TYLENOL) 500 mg tablet Take 2 tablets (1,000 mg total) by mouth 3 times  daily    ibuprofen (ADVIL,MOTRIN) 600 MG tablet Take 1 tablet (600 mg total) by mouth 3 times daily as needed for Pain    senna (SENOKOT) 8.6 MG tablet Take 2 tablets by mouth daily    ARTIFICIAL TEARS (LUBRIFRESH PM,GENTEAL PM) ointment Please administer every 6 hours to both eyes    albuterol HFA (PROVENTIL, VENTOLIN, PROAIR HFA) 108 (90 Base) MCG/ACT inhaler Inhale 2 puffs into the lungs every 4-6 hours as needed    azelastine (ASTELIN) 0.1 % nasal spray 1-2 sprays by Nasal route 2 times daily    Spacer/Aero-Holding Chambers (EASIVENT) spacer Use twice daily with Symbicort inhaler. Breathe in slowly.    fluticasone (FLONASE) 50 MCG/ACT nasal spray 2 sprays by Nasal route daily    Non-System Medication Medication/Supply: R thumb spica brace  Directions for Use: wear nightly    ipratropium-albuterol (DUONEB) 0.5-2.5mg  /59mL nebulizer solution Take 3 mLs by nebulization every 6 hours    Misc. Devices (CPAP) machine Download and compliance report    Cholecalciferol (VITAMIN D3) 5000 UNITS TABS Take 5,000 Units by mouth every morning       ipratropium (ATROVENT) 0.06 % nasal spray 2 sprays by Each Nare route 3 times daily     No current facility-administered medications for this visit.        Objective:      BP 112/70 (BP Location: Left arm, Patient Position: Sitting)    Ht 1.702 m (5\' 7" )    Wt 111.6 kg (246 lb)    LMP  (LMP Unknown)    BMI 38.53 kg/m      Imaging:  Interpretation Summary  History:  Carotid disease  Prior studies: 9.2021    Right Internal Carotid Artery (ICA)  Severe stenosis (80-99%), stable from prior.  Right vertebral artery is antegrade.    Left Internal Carotid Artery (ICA)  Severe stenosis (80-99%), stable from prior.  Left vertebral artery is antegrade.    Physical Exam:      General appearance: healthy, alert, active and no distress  HEENT: Normocephalic, bruise to right forehead  CV: regular rate and rhythm and no carotid bruit  Lungs: CTA bilaterally, respirations  unlabored  Abd: soft, non-tender  Extremities: Bilateral lower extremities are warm and well perfused without any evidence of clubbing, cyanosis,  or limb ischemia. Intact DP and PT pulses. No evidence of venous ulceration, lipodermatosclerosis, or edema.   Neuro: normal without focal findings, mental status, speech normal, alert and oriented x3 and sensation grossly normal    Assessment:   [name] is a 75 y.o. female with known severe bilateral carotid artery stenosis. Currently asymptomatic     Plan:     Carotid artery stenosis   I personally reviewed images of the carotid duplex which showed severe bilateral carotid artery stenosis   Reviewed results of imaging with patient - as above   Will need to consider intervention, however, given recent cardiac visit - will wait for stress test results and any subsequent need for cardiac intervention.  Will reach out to cardiac for clearance - CEA vs TCAR   Will obtain updated CTA head and neck    Patient to return to clinic once completed for surgical discussion    Continue antiplatelet and statin therapy    Seek immediate medical attention for any signs or symptoms of stroke or TIA     Plan discussed with Dr. Yves Dill, NP  06/12/2020 at 3:43 PM

## 2020-06-15 ENCOUNTER — Ambulatory Visit: Payer: Medicare (Managed Care) | Admitting: Urology

## 2020-06-15 ENCOUNTER — Telehealth: Payer: Self-pay | Admitting: Urology

## 2020-06-15 ENCOUNTER — Encounter: Payer: Self-pay | Admitting: Urology

## 2020-06-15 VITALS — Ht 67.0 in | Wt 248.0 lb

## 2020-06-15 DIAGNOSIS — N1330 Unspecified hydronephrosis: Secondary | ICD-10-CM

## 2020-06-15 NOTE — Telephone Encounter (Signed)
Follow-up disposition: Return in about 1 year (around 06/15/2021).   Check out comments: US renal in 1 year, see [place] after     Dr. Dortha Kern is requesting to see this patient back in 1 Year.  Unfortunately writer was unable to find an appointment in that time frame.  Any help would be appreciated.    Please schedule when provider schedule open.

## 2020-06-15 NOTE — Progress Notes (Signed)
HPI:  [name] is a 75 y.o. female   Here with daughter [name]  Was in [facility]  [phone]. Now home    Seen 10/26/2019 at [facility]  Presented as level 2 trauma, following fall down steps    She was found to have right scalp hematoma, left parietal scalp laceration, type III odontoid fracture, left 2 and 5-6 rib fxs, small splenic laceration, and periaortic fat stranding.     Pt became hypotensive with CT scan due to periaortic fat stranding previously seen. Vascular surgery consulted and determined there was no concern for aortic injury and no need for intervention from their standpoint. Pt received 2 L of IVF bolus and 2 unit RBC. Pt then became hypertensive with systolic BP 440-102V and patient received hydralazine. Pt reports having some left sided pain but improved with recent pain medication administration.     Denies hematuria or history of kidney/bladder issues.   Cr 0.77  Hematocrit 38 (33, s/p 2 unit RBC)     CT Angio abdomen and pelvis today shows stable fat stranding/hematoma near the upper abdominal aorta. Increased hematoma in the left retroperitoneum and abutting the psoas muscle." Wall thickening of the proximal to mid ureter without contrast extravasation, mild associated left hydronephrosis". Stable 3 cm hematoma adjacent to pancreatic tail, small hematoma along left lower abdominal wall laterally.     Low concern for ureteral injury, and with paraaortic stranding, the thought was this created stranding around left kidney and tracking down ureteral plane.     plan was for repeat images [place] 12/03/2019 done at nursing home was normal (reviewed)      was CT angio abdomen pelvis pelvis 12/28/2019  CT urogram ordered to be done at the same time. This was NOT performed as requested with urogram component    CT angio shows a less prominent left ureter in prior area of concern       She had e. Coli uti 11/20221 and 04/2020    Seen 05/2020 plan for CT urogram 06/08/2020:  reported as "mild left hydronephrosis", non specific.     -----------------  At time of CT angio  Incidentally visualized embolus in the proximal branch of the right upper lobe segmental pulmonary Was on eliquis , now stopped .She is on PLAVIX now  ------------              ---------  with a history of CAD s/p CABG (2012) and stenting (2015, on plavix), asthma, COPD and OSA       PMH:         Past Medical History:   Diagnosis Date    Allergic rhinitis 09/30/2014    Arthritis     Asthma     CAD (coronary artery disease)     DVT (deep venous thrombosis)     post-op after CABG, on anticoagulation for ~2 years    PE (pulmonary embolism)     post-op after CABG, on anticoagulation for ~2 years    Prediabetes     Tubular adenoma 03/2015    colonoscopy        PSH:        Past Surgical History:   Procedure Laterality Date    ACHILLES TENDON SURGERY      APPENDECTOMY      CHOLECYSTECTOMY      CORONARY ANGIOPLASTY WITH STENT PLACEMENT      CORONARY ARTERY BYPASS GRAFT  2013    triple  CYST REMOVAL      both thumbs    PR REPAIR EYE BLOWOUT,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    Procedure: ORBIT ORIF;  Surgeon: Jannett Celestine, DDS;  Location: [facility] MAIN OR;  Service: OMFS    TONSILLECTOMY AND ADENOIDECTOMY       Current Outpatient Medications   Medication    fluticasone-umeclidinium-vilanterol (TRELEGY ELLIPTA) 200-62.5-25 MCG/INH diskus inhaler    gabapentin (GABAPENTIN) 300 mg capsule    clopidogrel (PLAVIX) 75 mg tablet    sertraline (ZOLOFT) 100 mg tablet    atorvastatin (LIPITOR) 40 mg tablet    montelukast (SINGULAIR) 10 mg tablet    docusate sodium (COLACE) 100 MG capsule    polyethylene glycol (GLYCOLAX,MIRALAX) 17 g powder packet    tamsulosin (FLOMAX) 0.4 MG capsule    pantoprazole (PROTONIX) 40 MG EC tablet    acetaminophen (TYLENOL) 500 mg tablet    ibuprofen (ADVIL,MOTRIN) 600 MG tablet    senna (SENOKOT) 8.6 MG tablet    ARTIFICIAL TEARS (LUBRIFRESH PM,GENTEAL  PM) ointment    albuterol HFA (PROVENTIL, VENTOLIN, PROAIR HFA) 108 (90 Base) MCG/ACT inhaler    azelastine (ASTELIN) 0.1 % nasal spray    Spacer/Aero-Holding Chambers (EASIVENT) spacer    fluticasone (FLONASE) 50 MCG/ACT nasal spray    Non-System Medication    ipratropium-albuterol (DUONEB) 0.5-2.5mg  /24mL nebulizer solution    ipratropium (ATROVENT) 0.06 % nasal spray    Misc. Devices (CPAP) machine    Cholecalciferol (VITAMIN D3) 5000 UNITS TABS     No current facility-administered medications for this visit.       Allergies:         Allergies   Allergen Reactions    Morphine Hives     Has tolerated Percocet            Social History:   Social History           Socioeconomic History    Marital status: Widowed     Spouse name: Not on file    Number of children: Not on file    Years of education: Not on file    Highest education level: Not on file   Tobacco Use    Smoking status: Never Smoker    Smokeless tobacco: Never Used   Substance and Sexual Activity    Alcohol use: No    Drug use: No    Sexual activity: Not on file   Other Topics Concern    Not on file   Social History Narrative    May 2016        Lives with her husband in an in law suite at her son's house        Three children, all live locally     Five grandchildren     Retired from school food service       Family Hx:        Family History   Problem Relation Age of Onset    Other Mother         "rare lung disease"    Aneurysm Mother         abdominal aortic aneurysm    Diabetes Type II Mother     Breast cancer Mother     Blood clots Mother     Stroke Father     Stroke Brother         x2    Thyroid cancer Daughter            ROS    General: Alert  but sleepy after pain medication, appears stated age and cooperative, resting comfortably in bed  Chest: Unlabored respirations   Abdomen: Soft, non-distended, non-tender, no suprapubic tenderness, no guarding or rigidity. No ecchymosis noted to  abdomen or left flank.   GENITOURINARY: foley in place draining clear yellow urine.  Psych: Affect pleasant, makes eye contact.     PE  .vs  LMP  (LMP Unknown)   LMP  (LMP Unknown)    General appearance - alert, well appearing, and in no distress  Eyes - sclera anicteric    Chest - normal respiratory effort        Extremities - no pedal edema                 Assessment:  [name] is a 75 y.o. female with presented 11/25/2019 as level II trauma following fall down steps yesterday.     She was found to have right scalp hematoma, left parietal scalp laceration, type III odontoid fracture, left 2 and 5-6 rib fxs, small splenic laceration, and periaortic fat stranding.     CT Angio abdomen and pelvis today shows stable fat stranding/hematoma near the upper abdominal aorta. Increased hematoma in the left retroperitoneum and abutting the psoas muscle, no injury evident to kidney. Wall thickening of the proximal to mid ureter without contrast extravasation, contrast present in ureter distal to area of thickening. Stable 3 cm hematoma adjacent to pancreatic tail, small hematoma along left lower abdominal wall laterally.       Low concern for ureteral injury, and with paraaortic stranding, the thought was this created stranding around left kidney and tracking down ureteral plane.     plan was for repeat images plan was for repeat images [place] 12/03/2019 done at nursing home was normal (reviewed)    was CT angio abdomen pelvis pelvis 12/28/2019  CT urogram ordered to be done at the same time. This was NOT performed as requested with urogram component    CT angio shows a less prominent left ureter in prior area of concern     Seen 05/2020 plan for CT urogram 06/08/2020: reported as "mild left hydronephrosis", non specific.   Upon my review if there is any dilation it is rather subtle, with ready contrast flow into ureter    I believe this to not require further work up right now given asymptomatic nature , as  well as rather subtle finding, which  Likely carries no clinical significance    We may offer a [place] in 1 year.    -----------------    Ct finding: . Nonspecific, small cystic focus at the level of the right adnexa. The finding is similar in appearance to the prior studies. In a patient this age, the finding is nonspecific. Consider follow-up pelvic ultrasound for further assessment.    Defer to Dr. Laurance Flatten PCP    --------------  Stop tamsulosin (reason for being on it?)      Plan:    Follow up with urology 1 year with [place]    Message to Dr. Laurance Flatten regarding right adnexa cyst    Stop tamsulosin (reason for being on it?)        Margaretha Glassing, MD    All notes, labs, and images referenced in the note, were independently reviewed.    This visit was completed using video        Notes:     1. Portions of this report may have been transcribed using voice recognition software. Every  effort was made to ensure accuracy; however, inadvertent computerized transcription errors may be present.    2. Verbal and/or Written Consent was obtained from patient regarding this telemedicine visit that includes the following topics:   They have the right to refuse a telehealth consultation in favor of an in-person visit.     This telehealth visit will not be recorded without consent,    They understand that they or I can discontinue the telehealth consult/visit if videoconferencing connections are not adequate for the situation.   They understand that they will be responsible for any copayments or coinsurances that may apply to this telehealth visit.

## 2020-06-20 ENCOUNTER — Encounter: Payer: Self-pay | Admitting: Gastroenterology

## 2020-06-21 ENCOUNTER — Encounter: Payer: Self-pay | Admitting: Gastroenterology

## 2020-06-22 ENCOUNTER — Encounter: Payer: Self-pay | Admitting: Vascular Surgery

## 2020-06-22 ENCOUNTER — Encounter: Payer: Self-pay | Admitting: Primary Care

## 2020-06-23 ENCOUNTER — Other Ambulatory Visit: Payer: Self-pay | Admitting: Vascular Surgery

## 2020-06-23 DIAGNOSIS — I6523 Occlusion and stenosis of bilateral carotid arteries: Secondary | ICD-10-CM

## 2020-07-17 ENCOUNTER — Ambulatory Visit: Payer: Medicare (Managed Care) | Admitting: Primary Care

## 2020-07-17 ENCOUNTER — Encounter: Payer: Self-pay | Admitting: Primary Care

## 2020-07-17 ENCOUNTER — Other Ambulatory Visit
Admission: RE | Admit: 2020-07-17 | Discharge: 2020-07-17 | Disposition: A | Discharge status: Home / Self Care | Payer: Medicare (Managed Care) | Source: Ambulatory Visit | Attending: Primary Care | Admitting: Primary Care

## 2020-07-17 VITALS — BP 116/82 | HR 88 | Temp 97.3°F | Ht 67.0 in | Wt 248.0 lb

## 2020-07-17 DIAGNOSIS — I2510 Atherosclerotic heart disease of native coronary artery without angina pectoris: Secondary | ICD-10-CM | POA: Insufficient documentation

## 2020-07-17 DIAGNOSIS — I6523 Occlusion and stenosis of bilateral carotid arteries: Secondary | ICD-10-CM

## 2020-07-17 DIAGNOSIS — N949 Unspecified condition associated with female genital organs and menstrual cycle: Secondary | ICD-10-CM | POA: Insufficient documentation

## 2020-07-17 DIAGNOSIS — I350 Nonrheumatic aortic (valve) stenosis: Secondary | ICD-10-CM

## 2020-07-17 DIAGNOSIS — Z0000 Encounter for general adult medical examination without abnormal findings: Secondary | ICD-10-CM

## 2020-07-17 DIAGNOSIS — L309 Dermatitis, unspecified: Secondary | ICD-10-CM

## 2020-07-17 DIAGNOSIS — R935 Abnormal findings on diagnostic imaging of other abdominal regions, including retroperitoneum: Secondary | ICD-10-CM | POA: Insufficient documentation

## 2020-07-17 LAB — LIPID PANEL
Chol/HDL Ratio: 3.8
Cholesterol: 162 mg/dL
HDL: 43 mg/dL (ref 40–60)
LDL Calculated: 90 mg/dL
Non HDL Cholesterol: 119 mg/dL
Triglycerides: 143 mg/dL

## 2020-07-17 LAB — COMPREHENSIVE METABOLIC PANEL
ALT: 22 U/L (ref 0–35)
AST: 31 U/L (ref 0–35)
Albumin: 4.2 g/dL (ref 3.5–5.2)
Alk Phos: 130 U/L — ABNORMAL HIGH (ref 35–105)
Anion Gap: 10 (ref 7–16)
Bilirubin,Total: 0.4 mg/dL (ref 0.0–1.2)
CO2: 30 mmol/L — ABNORMAL HIGH (ref 20–28)
Calcium: 9.8 mg/dL (ref 8.6–10.2)
Chloride: 103 mmol/L (ref 96–108)
Creatinine: 0.72 mg/dL (ref 0.51–0.95)
Glucose: 92 mg/dL (ref 60–99)
Lab: 16 mg/dL (ref 6–20)
Potassium: 4.6 mmol/L (ref 3.3–5.1)
Sodium: 143 mmol/L (ref 133–145)
Total Protein: 6.7 g/dL (ref 6.3–7.7)
eGFR BY CREAT: 88 *

## 2020-07-17 LAB — HEMOGLOBIN A1C: Hemoglobin A1C: 6.1 % — ABNORMAL HIGH

## 2020-07-17 MED ORDER — TRIAMCINOLONE ACETONIDE 0.5 % EX CREA *I*
TOPICAL_CREAM | Freq: Two times a day (BID) | CUTANEOUS | 0 refills | Status: AC
Start: 2020-07-17 — End: 2020-07-31

## 2020-07-17 MED ORDER — ZOSTER VACCINE LIVE 19400 UNT/0.65ML SC SOLR *I*
19400.0000 [IU] | Freq: Once | SUBCUTANEOUS | 0 refills | Status: AC
Start: 2020-07-17 — End: 2020-07-17

## 2020-07-17 NOTE — Patient Instructions (Addendum)
Thank you for completing your Subsequent Annual Medicare Visit   with [place] today.     The purpose of this visits was to:     Screen for disease   Assess risk of future medical problems   Help develop a healthy lifestyle   Update vaccines   Get to know your doctor in case of an illness    Patient Care Team:  [name], MD as PCP - General (Primary Care)  Tamala Julian, Sharene Butters, MD as Consulting Provider (Pulmonology)  Trilby Leaver, MD as Consulting Provider (Cardiology)  Sonia Side Cornelious Bryant, MD as Consulting Provider (Otolaryngology)  Suszanne Finch, MD (General Surgery)     Medicare 5 Year Plan    The following items were identified as areas of concern during your screening today:  High Blood Pressure (Hypertension) - This is a risk factor for Heart Attack, Stroke, Kidney Problems and Eye Problems.   BMI greater than 25 - This is a risk for Heart Attack, Stroke, High Blood Pressure, Diabetes, High Cholesterol and other complications.       The Health Maintenance table below identifies screening tests and immunizations recommended by your health care team:  Health Maintenance: These screening recommendations are based on USPSTF, [employer], and [place] state guidelines   Topic Date Due    HIV Screening  Never done    Shingles Vaccine (1 of 2) Never done    Colon Cancer Screening  03/20/2018    DEPRESSION SCREEN MONTHLY  08/17/2020    Osteoporosis Screening  05/14/2021    Fall Risk Screening  07/17/2021    Breast Cancer Screening  06/01/2022    Flu Shot  Completed    Adult Prevnar Vaccination  Completed    Adult Pneumovax Vaccine  Completed    Hepatitis C Screening  Completed    COVID-19 Vaccine  Completed     In addition, goals and orders placed to address these recommendations are listed in the "Today's Visit" section.    Call Dr Verner Chol to do the colonoscopy : Office Phone # [phone]    We wish you the best of health and look forward to seeing you again next year for your Annual  Medicare Wellness Visit.     If you have any health care concerns before then, please do not hesitate to contact us.          There are two main categories of fiber, soluble and insoluble. Each of these are very different; soluble fiber dissolves in water, insoluble does not. In general, soluble fibers slow down the GI tract and are helpful for those with looser stools or frequent bowel movements, and insoluble fibers speed things up and are more beneficial to those with infrequent bowel movements and tend to be more constipated. Take home point: for diarrhea you should increase your soluble fiber and reduce your insoluble fiber intake, for constipation you should increase your insoluble fiber intake.    Dietary examples of soluble fiber include items such as pasta, rice, oatmeal, corn meal, legumes, squashes, avocados, banana, apples, carrots, potatoes, yams, etc. Think starches! Supplemental forms of soluble fiber include Benefiber, Fibersure, Citrucel, and Heather's Tummy Fiber (Organic).     Metamucil and other products containing Psyllium husk (ie. Konsyl) contain both soluble and insoluble fiber.     Dietary examples of insoluble fiber include leafy greens, green beans, cucumbers, celery, whole grains (bran), granola, fruits with peels/skins, most raw vegetables. Supplemental forms of insoluble fiber include Unifiber, Fibercon tablets, and Fiberchoice.  In most cases, determining which fiber option is going to work best for patients is a process of trial and error. The goal for fiber intake is 25-30g daily.    Remember to drink plenty of water when on fiber therapy!

## 2020-07-17 NOTE — Progress Notes (Signed)
[facility]          SUBJECTIVE    Pt is here to discuss:    Chief Complaint   Patient presents with    Subsequent Annual Medicare Visit         1. Carotid stenosis - had cardiac stress testing (normal). Has repeat CTA and then will discuss surgical treatment soon.    2. Aortic stenosis - moderate on echo done by cardiology office. Normal lexiscan. Has appt in may with cardiologist to discuss. Still remains SOB and has some interscapular pain that radiates to anterior chest. Noticing some belching however, wondering if GERD related.+dysphagia to pills and solids too.      3. Dermatitis - L wrist. Present for months. Trying OTC steroid cream with minimal relief.     4. R adnexael cyst- seen on CT done by urology 05/2020. Ovaries not visualized on pelvic US 02/2020    PMH / Family Hx / Social Hx  Patient's medications, allergies, problem list, past medical, social histories were reviewed and notable for:    Current Outpatient Medications   Medication Sig    fluticasone-umeclidinium-vilanterol (TRELEGY ELLIPTA) 200-62.5-25 MCG/INH diskus inhaler Inhale 1 puff into the lungs daily    gabapentin (GABAPENTIN) 300 mg capsule Take 1 capsule (300 mg total) by mouth 3 times daily    clopidogrel (PLAVIX) 75 mg tablet     sertraline (ZOLOFT) 100 mg tablet Take 1 tablet (100 mg total) by mouth daily  Replacing 50mg  dose, please cancel other refills    atorvastatin (LIPITOR) 40 mg tablet Take 1 tablet (40 mg total) by mouth daily (with dinner)    montelukast (SINGULAIR) 10 mg tablet Take 1 tablet (10 mg total) by mouth nightly    docusate sodium (COLACE) 100 MG capsule Take 100 mg by mouth 2 times daily    polyethylene glycol (GLYCOLAX,MIRALAX) 17 g powder packet Take by mouth daily as needed    pantoprazole (PROTONIX) 40 MG EC tablet Take 1 tablet (40 mg total) by mouth daily (before breakfast)  Swallow whole. Do not crush, break, or chew.    acetaminophen (TYLENOL) 500 mg tablet Take 2 tablets (1,000 mg  total) by mouth 3 times daily    ibuprofen (ADVIL,MOTRIN) 600 MG tablet Take 1 tablet (600 mg total) by mouth 3 times daily as needed for Pain    senna (SENOKOT) 8.6 MG tablet Take 2 tablets by mouth daily    ARTIFICIAL TEARS (LUBRIFRESH PM,GENTEAL PM) ointment Please administer every 6 hours to both eyes    albuterol HFA (PROVENTIL, VENTOLIN, PROAIR HFA) 108 (90 Base) MCG/ACT inhaler Inhale 2 puffs into the lungs every 4-6 hours as needed    azelastine (ASTELIN) 0.1 % nasal spray 1-2 sprays by Nasal route 2 times daily    Spacer/Aero-Holding Chambers (EASIVENT) spacer Use twice daily with Symbicort inhaler. Breathe in slowly.    fluticasone (FLONASE) 50 MCG/ACT nasal spray 2 sprays by Nasal route daily    Non-System Medication Medication/Supply: R thumb spica brace  Directions for Use: wear nightly    ipratropium-albuterol (DUONEB) 0.5-2.5mg  /35mL nebulizer solution Take 3 mLs by nebulization every 6 hours    Misc. Devices (CPAP) machine Download and compliance report    Cholecalciferol (VITAMIN D3) 5000 UNITS TABS Take 5,000 Units by mouth every morning       triamcinolone (KENALOG) 0.5 % cream Apply topically 2 times daily for 14 days  to the following areas: R wrist    zoster LIVE VACCINE, PF, (  ZOSTAVAX) 83151 UNT/0.65ML injection Inject 0.65 mLs (19,400 units total) into the skin once for 1 dose  Repeat second dose in 2-6 months       ROS  Wt Readings from Last 3 Encounters:   07/17/20 112.5 kg (248 lb)   06/15/20 112.5 kg (248 lb)   06/08/20 110.2 kg (243 lb)           OBJECTIVE  Vitals:    07/17/20 1406   BP: 116/82   Pulse: 88   Temp: 36.3 C (97.3 F)   Weight: 112.5 kg (248 lb)   Height: 1.702 m (5\' 7" )     Body mass index is 38.84 kg/m.      General: well-appearing Caucasian female, pleasant & conversant, in NAD   Skin: L wrist with patch of erythema and excoriations within  Psych: AAOx3, normal affect and mood. Insight and judgement intact.           ASSESSMENT & PLAN  1. Carotid stenosis,  bilateral  Continue FU with vascular surgeon . Agree with surgery moving forward.     2. Aortic valve stenosis, etiology of cardiac valve disease unspecified  Recommended to discuss SOB and chest pain with cardiologist in may. I am concerned these symptoms are explained by the progression of her aortic stenosis and warrant close observation and/or possible surgery.     3. Adnexal cyst  Will f/u with GYN [place] in July (4mo after CT scan)    4. Dermatitis  - triamcinolone (KENALOG) 0.5 % cream; Apply topically 2 times daily for 14 days  to the following areas: R wrist  Dispense: 15 g; Refill: 0    5. Preventative health care  AWV completed  - zoster LIVE VACCINE, PF, (ZOSTAVAX) 76160 UNT/0.65ML injection; Inject 0.65 mLs (19,400 units total) into the skin once for 1 dose  Repeat second dose in 2-6 months  Dispense: 0.65 mL; Refill: 0        Follow-up: 4-4mo re: aortic stenosis    [name], MD  [place]  07/17/2020  6:07 PM        ______________________

## 2020-07-17 NOTE — Progress Notes (Signed)
Visit performed as:             Office Visit, met with patient in person    Today we reviewed and updated [name] smoking status, activities of daily living, depression screen, fall risk, medications and allergies.   I have counseled the patient in the above areas.     Subjective:     Chief Complaint: [name] is a 75 y.o. female here for a/an Subsequent Annual Medicare Visit    In general, [name] rates their overall health as:  fair      Patient Care Team:  [name], MD as PCP - General (Primary Care)  Petronaci, Sharene Butters, MD as Consulting Provider (Pulmonology)  Trilby Leaver, MD as Consulting Provider (Cardiology)  Sonia Side, Cornelious Bryant, MD as Consulting Provider (Otolaryngology)  Suszanne Finch, MD (General Surgery)  Randolm Idol, MD as Consulting Provider (Vascular Surgery)  Margaretha Glassing, MD as Consulting Provider (Urology)     Current Outpatient Medications on File Prior to Visit   Medication Sig Dispense Refill    fluticasone-umeclidinium-vilanterol (TRELEGY ELLIPTA) 200-62.5-25 MCG/INH diskus inhaler Inhale 1 puff into the lungs daily 1 each 5    gabapentin (GABAPENTIN) 300 mg capsule Take 1 capsule (300 mg total) by mouth 3 times daily 180 capsule 2    clopidogrel (PLAVIX) 75 mg tablet       sertraline (ZOLOFT) 100 mg tablet Take 1 tablet (100 mg total) by mouth daily  Replacing 6m dose, please cancel other refills 90 tablet 3    atorvastatin (LIPITOR) 40 mg tablet Take 1 tablet (40 mg total) by mouth daily (with dinner) 90 tablet 3    montelukast (SINGULAIR) 10 mg tablet Take 1 tablet (10 mg total) by mouth nightly 90 tablet 3    docusate sodium (COLACE) 100 MG capsule Take 100 mg by mouth 2 times daily      polyethylene glycol (GLYCOLAX,MIRALAX) 17 g powder packet Take by mouth daily as needed      pantoprazole (PROTONIX) 40 MG EC tablet Take 1 tablet (40 mg total) by mouth daily (before breakfast)  Swallow whole. Do not  crush, break, or chew. 90 each 3    acetaminophen (TYLENOL) 500 mg tablet Take 2 tablets (1,000 mg total) by mouth 3 times daily 30 tablet     ibuprofen (ADVIL,MOTRIN) 600 MG tablet Take 1 tablet (600 mg total) by mouth 3 times daily as needed for Pain      senna (SENOKOT) 8.6 MG tablet Take 2 tablets by mouth daily      ARTIFICIAL TEARS (LUBRIFRESH PM,GENTEAL PM) ointment Please administer every 6 hours to both eyes 3.5 g     albuterol HFA (PROVENTIL, VENTOLIN, PROAIR HFA) 108 (90 Base) MCG/ACT inhaler Inhale 2 puffs into the lungs every 4-6 hours as needed 1 each 6    azelastine (ASTELIN) 0.1 % nasal spray 1-2 sprays by Nasal route 2 times daily 90 mL 3    Spacer/Aero-Holding Chambers (EASIVENT) spacer Use twice daily with Symbicort inhaler. Breathe in slowly. 1 each 2    fluticasone (FLONASE) 50 MCG/ACT nasal spray 2 sprays by Nasal route daily 48 g 1    Non-System Medication Medication/Supply: R thumb spica brace  Directions for Use: wear nightly 1 each 0    ipratropium-albuterol (DUONEB) 0.5-2.582m/51m54mebulizer solution Take 3 mLs by nebulization every 6 hours 90 mL 1    Misc. Devices (CPAP) machine Download and compliance report 1 each 0  Cholecalciferol (VITAMIN D3) 5000 UNITS TABS Take 5,000 Units by mouth every morning          No current facility-administered medications on file prior to visit.     Allergies   Allergen Reactions    Morphine Hives     Has tolerated Percocet     Patient Active Problem List    Diagnosis Date Noted    Pulmonary nodule 09/30/2014     Priority: High     CT 02/20/15:  Stable left lower lung 6 mm nodule.     Repeat 12/18 stable, no furhter imaging indicated      Prediabetes 09/30/2014     Priority: Medium    Morbid obesity 01/12/2014     Priority: Medium    Severe Obstructive sleep apnea 11/11/2013     Priority: Medium     Adherent with CPAP      Coronary atherosclerosis 03/27/2013     Priority: Medium     Coronary artery disease   CABG 2013.    PCI to LAD  in 8/13.    Coronary angiogram February of 2015 at [facility] after she had chest pain and an abnormal stress test that revealed patent stent and 3/3 grafts patent. The ejection fraction was 60%.    Episode of atypical chest pain 04/2015, negative troponins, negative nuclear stress test.         Gastritis 09/30/2014     Priority: Low    Endometrial thickening on CT 10/31/2019     6/21- incidental finding during hospitalization      Carotid stenosis, bilateral 10/31/2019     6/21- incidental finding during hospitalization, >70%  9/21- Severe, surgery delayed due to odontoid fracture winter 2021  1/22- severe, recommending   cardiac  clearance and then CEA vs TCAR      Closed odontoid fracture with type III morphology and routine healing, subsequent encounter 10/25/2019    Environmental allergies 07/24/2018     Immunotherapy ~2010      Osteopenia 04/03/2018     DXA 10/2016 AP spine -0.2, L neck 1.1, R -1.3, total L -1.0, total R -0.9  05/2018 AP spine -0.2, L neck -2.1, total -1.3      Anxiety and depression 06/12/2017    Aortic stenosis 04/14/2017     Mild on echo 3/21, repeat in 29yr Mod/severe on echo 12/2019    Moderate again 04/2020  Getting nuclear stress echo      Health care maintenance 01/01/2017     PCN allergy removed after negative allergy testing.       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 Transitioned to trelegy    12/21- Fev1/FVC 67 ; FEV1 73%  The values have increased compared to previous testing in March 2021. The FEV1 and FVC increased by 14%  and 10% respectively.      Aortic valve  disorder 06/30/2013    Tachycardia 06/30/2013    DVT (deep venous thrombosis), right 04/12/2013    Shortness of breath 04/12/2013    Pulmonary embolism 04/12/2013  12/2019      Asthma 03/27/2013     11/21 Spirometry today reveals an FEV1 of 1.66 liters (73% of predicted), an FVC of 2.46 liters (83 % of predicted) and a normal FEV1/FVC ratio of 67 %. Diffusing capacity is 20.34 ml/min/mmHg or  98% of predicted. These values are consistent with normal spirometry (since trelegy added). The values have increased compared to previous testing in March 2021. The FEV1 and FVC increased by 14%  and 10% respectively.      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     Past Medical History:   Diagnosis Date    Allergic rhinitis 09/30/2014    Arthritis     Asthma     CAD (coronary artery disease)     DVT (deep venous thrombosis)     post-op after CABG, on anticoagulation for ~2 years    PE (pulmonary embolism)     post-op after CABG, on anticoagulation for ~2 years    Prediabetes     Tubular adenoma 03/2015    colonoscopy      Past Surgical History:   Procedure Laterality Date    ACHILLES TENDON SURGERY      APPENDECTOMY      CHOLECYSTECTOMY      CORONARY ANGIOPLASTY WITH STENT PLACEMENT  2012    CORONARY ARTERY BYPASS GRAFT  2013    triple     CYST REMOVAL      both thumbs    PR REPAIR EYE BLOWOUT,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    Procedure: ORBIT ORIF;  Surgeon: Jannett Celestine, DDS;  Location: [facility] MAIN OR;  Service: OMFS    TONSILLECTOMY AND ADENOIDECTOMY       Family History   Problem Relation Age of Onset    Other Mother         "rare lung disease"    Aneurysm Mother         abdominal aortic aneurysm    Diabetes Type II Mother     Breast cancer Mother     Blood clots Mother     Stroke Father     Stroke Brother         x2    Thyroid cancer Daughter      Social History     Socioeconomic History    Marital status: Widowed     Spouse name:  Not on file    Number of children: Not on file    Years of education: Not on file    Highest education level: Not on file   Occupational History    Not on file   Tobacco Use    Smoking status: Never Smoker    Smokeless tobacco: Never Used   Substance and Sexual Activity    Alcohol use: No    Drug use: No    Sexual activity: Not on file   Social History Narrative    May 2016        Lives with her husband in an in law suite at her son's house        Three children, all live locally     Five grandchildren     Retired from school food service       Objective:     Vital Signs: BP 116/82    Pulse 88    Temp 36.3 C (97.3 F)    Ht 1.702 m (5' 7")    Wt 112.5 kg (248 lb)  LMP  (LMP Unknown)    BMI 38.84 kg/m    BMI: Body mass index is 38.84 kg/m.    Vision Screening Results (Welcome visit only):  No exam data present    Depression Screening Results:  Recent Review Flowsheet Data     PHQ Scores 07/17/2020 01/04/2020 09/06/2019 02/03/2019 10/22/2018 01/19/2018 04/14/2017    PSQ2 Q1 - Interest/Pleasure - Y Y N N N N    PSQ2 Q2 - Down, Depressed, Hopeless - N Y N N N N    PHQ Q9 - Better Off Dead - 0 1 - - - -    PHQ Calculated Score 0 3 16 - - - -        Opioid Use/DAST- 10 Screening Results:   How many times in the past year have you used an illegal drug or used a prescription medication for nonmedical reasons?: 0 (07/17/2020  2:05 PM)    Activities of Daily Living/Functional Screening Results:  Is the person deaf or does he/she have serious difficulty hearing?: N  Is this person blind or does he/she have serious difficulty seeing even when wearing glasses?: N  *Vision Status: Visual aid   Does this person have serious difficulty walking or climbing stairs?: N  *Walks in Home: Independent  *Climbing Stairs: Independent  Does this person have difficulty dressing or bathing?: N  *Shopping: Independent  *House Keeping: Needs Assistance (Has an aide twice a week)  *Managing Own Medications: Independent  *Handling Finances:  Independent  Difficulty doing errands due to a physicial, mental or emotional condition: No  Difficulty remembering or making decisions due to a physicial, mental or emotional condition: No      Fall Risk Screening Results:  Have you fallen in the last year?: Yes  Did you sustain an injury which required medical attention?: Yes  What level of medical attention?: Hospitalization  Do you feel you are at risk for falling?: No      Assessment and Plan:     Cognitive Function:  Recall of recent and remote events appears:  Normal      Advanced Care Planning:  was discussed and the paperwork can be found in the scanned media section     The following health maintenance plan was reviewed with the patient:    Health Maintenance Topics with due status: Overdue       Topic Date Due    HIV Screening USPSTF/[place] Never done    IMM-ZOSTER Never done    Colon Cancer Screening Other 03/20/2018     Health Maintenance Topics with due status: Not Due       Topic Last Completion Date    Osteoporosis Screening Other 05/14/2018    Breast Cancer Screening USPSTF 06/01/2020    DEPRESSION SCREEN MONTHLY 07/17/2020    Fall Risk Screening 07/17/2020     Health Maintenance Topics with due status: Completed       Topic Last Completion Date    IMM-PREVNAR VACCINE 65 + YRS 09/14/2015    IMM-PNEUMOVAX VACCINE 65 + YRS 09/20/2016    Hepatitis C Screening USPSTF/[place] 04/14/2017    IMM-INFLUENZA 02/15/2020    COVID-19 Vaccine 02/15/2020     This health maintenance schedule, identified risks, a list of orders placed today and patient goals have been provided to [employer] in the after visit summary.     Plan for any concerns identified during screening or risk assessments:  Recommended FU with Dr Verner Chol  Recommended shingrix at pharmacy  [name], MD  [facility]  07/17/2020  6:06 PM

## 2020-07-19 ENCOUNTER — Encounter: Payer: Self-pay | Admitting: Primary Care

## 2020-07-20 MED ORDER — ATORVASTATIN CALCIUM 80 MG PO TABS *I*
80.0000 mg | ORAL_TABLET | Freq: Every day | ORAL | 3 refills | Status: AC
Start: 2020-07-20 — End: 2021-07-20

## 2020-07-23 ENCOUNTER — Encounter: Payer: Self-pay | Admitting: Radiology

## 2020-07-23 ENCOUNTER — Ambulatory Visit
Admission: RE | Admit: 2020-07-23 | Discharge: 2020-07-23 | Disposition: A | Discharge status: Home / Self Care | Payer: Medicare (Managed Care) | Source: Ambulatory Visit | Attending: Vascular Surgery | Admitting: Vascular Surgery

## 2020-07-23 DIAGNOSIS — I6523 Occlusion and stenosis of bilateral carotid arteries: Secondary | ICD-10-CM | POA: Insufficient documentation

## 2020-07-23 MED ORDER — IOHEXOL 350 MG/ML (OMNIPAQUE) IV SOLN *I*
1.0000 mL | Freq: Once | INTRAVENOUS | Status: AC
Start: 2020-07-23 — End: 2020-07-23
  Administered 2020-07-23: 70 mL via INTRAVENOUS

## 2020-07-24 ENCOUNTER — Encounter: Payer: Self-pay | Admitting: Vascular Surgery

## 2020-07-26 ENCOUNTER — Encounter: Payer: Self-pay | Admitting: Vascular Surgery

## 2020-07-26 ENCOUNTER — Ambulatory Visit: Payer: Medicare (Managed Care) | Admitting: Vascular Surgery

## 2020-07-26 VITALS — BP 130/72 | Ht 67.0 in | Wt 239.0 lb

## 2020-07-26 DIAGNOSIS — I6523 Occlusion and stenosis of bilateral carotid arteries: Secondary | ICD-10-CM

## 2020-07-26 NOTE — Invasive Procedure Plan of Care (Signed)
[place]  OR SURGICAL PROCEDURE                            Patient Name: [name]  [facility] [address]                                                              DOB: 1945/06/01         Please read this form or have someone read it to you.   It's important to understand all parts of this form. If something isn't clear, ask [place] to explain.   When you sign it, that means you understand the form and give [place] permission to do this surgery or procedure.     I agree for Randolm Idol, MD , and other members of the Division of Vascular Surgery (Drs. Frances Furbish, Deloris Ping, Glocker, Mix, Hayden, Goodrich) along with any assistants* they may choose, to treat the following condition(s): Narrowing of the carotid artery in your neck, which is increasing chances of a future stroke   By doing this surgery or procedure on me: Place a stent in the carotid artery using a small neck incision and a special device to reverse the flow in your carotid artery to prevent strokes during procedure   This is also known as: Right sided carotid stenting through transcarotid approach with flow reversal (TCAR procedure)   Laterality: Right     *if you'd like a list of the assistants, please ask. We can give that to you.    1. The care provider has explained my condition to me. They have told me how the procedure can help me. They have told me about other ways of treating my condition. I understand the care provider cannot guarantee the result of the procedure. If I don't have this procedure, my other choices are: No surgery, continued non-surgical medical management    2. The care provider has told me the risks (problems that can happen) of the procedure. I understand there may be unwanted results. The risks that are related to this procedure include: Excessive bleeding, infection, damage to nearby tissues, damage to nerves which could result in voice  changes or difficulty in eating/swallowing/speaking, stroke, heart attack, recurrence of narrowing of carotid artery in future, reactions to general anesthesia, reactions to contrast, kidney injury from contrast, hematoma in neck, compression of airway from hematoma in neck, need for future elective or emergent procedures, death      3. I understand that during the procedure, my care provider may find a condition that we didn't know about before the treatment started. Therefore, I agree that my care provider can perform any other treatment which they think is necessary and available.    4. I give permission to the hospital and/or its departments to examine and keep tissue, blood, body parts, fluids or materials removed from my body during the procedure(s) to aid in diagnosis and treatment, after which they may be used for scientific research or teaching by appropriate persons. If these materials are used for science or teaching, my identity will be protected. I will no longer own or have any rights to these materials  regardless of how they may be used.    5. My care provider might want a representative from a [employer] [place] to be there during my procedure. I understand that person works for:  Wellsite geologist from [employer]        The ways they might help my care provider during my procedure include:        providing information and support to hospital staff regarding the device, helping the OR staff prepare and other, including any hands on assistance (describe)    6. Here are my decisions about receiving blood, blood products, or tissues. I understand my decisions cover the time before, during and after my procedure, my treatment, and my time in the hospital. After my procedure, if my condition changes a lot, my care provider will talk with me again about receiving blood or blood products. At that time, my care provider might need me to review and sign another consent form, about getting or refusing  blood.    I understand that the blood is from the community blood supply. Volunteers donated the blood, the volunteers were screened for health problems. The blood was examined with very sensitive and accurate tests to look for hepatitis, HIV/AIDS, and other diseases. Before I receive blood, it is tested again to make sure it is the correct type.    My chances of getting a sickness from blood products are small. But no transfusion is 100% safe. I understand that my care provider feels the good I will receive from the blood is greater than the chances of something going wrong. My care provider has answered my questions about blood products.      My decision  about blood or  blood products   Yes        My decision   about tissue  Implants     N/A          I understand this  form.    My care provider  or his/her  assistants have  explained:   What I am having done and why I need it.  What other choices I can make instead of having this done.  The benefits and possible risks (problems) to me of having this done.  The benefits and possible risks (problems) to me of receiving transplants, blood, or blood products.  There is no guarantee of the results.  The care provider may not stay with me the entire time that I am in the operating or procedure room.  My provider has explained how this may affect my procedure. My provider has answered my  questions about this.         I give my  permission for  this surgery or  procedure.            _______________________________________________                                     My signature  (or parent or other person authorized to sign for you, if you are unable to sign for  yourself or if you are under 14 years old)        ______           Date        _____        Time   Electronic Signatures will display at the bottom of the consent form.  Care provider's statement: I have discussed the planned procedure, including the possibility for transfusion of blood  products or receipt  of tissue as necessary; expected benefits; the possible complications and risks; and possible alternatives  and their benefits and risks with the patients or the patient's surrogate. In my opinion, the patient or the patient's surrogate  understands the proposed procedure, its risks, benefits and alternatives.              Electronically signed by: Randolm Idol, MD                                                07/26/2020         Date        1:56 PM        Time

## 2020-07-28 DIAGNOSIS — I6529 Occlusion and stenosis of unspecified carotid artery: Secondary | ICD-10-CM | POA: Diagnosis present

## 2020-07-28 NOTE — Progress Notes (Signed)
Vascular Surgery Outpatient Clinic Follow-Up Visit    HPI:     [name] is a 75 y.o. female with PMH significant for CAD s/p CABG, history of DVT/PE following CABG, who is seen in the Vascular Surgery outpatient clinic for follow up of carotid artery stenosis.    [name] was seen in clinic about 4 months ago.  She was initially consulted by Vascular Surgery was she was at the hospital following a fall.  There was concern for aortic injury on imaging.  CTA imaging completed at time of visit showed no evidence of injury.  She was also consulted for severe stenosis bilaterally of the carotid arteries.  At the time, given that patient remained asymptomatic and was still recovering from the fall.      Today, [name] continues to deny any signs or symptoms of stroke or TIA including amaurosis fugax, incoherent speech, facial droop, or unilateral weakness/paralysis.  She was seen in the ED about one week ago after a fall with injury to the head.  Additionally she was seen by cardiology and because of noted DOE.  Echo was completed and scanned into chart.  Acceptable risk to proceed.    Patient's medications, allergies, and medical, surgical, family, and social histories were updated, as appropriate, in eRecord during today's office visit.    REVIEW OF SYSTEMS  ROS - Pertinent items noted in HPI.    MEDICAL HISTORY       Past Medical History:   Diagnosis Date    Allergic rhinitis 09/30/2014    Arthritis     Asthma     CAD (coronary artery disease)     DVT (deep venous thrombosis)     post-op after CABG, on anticoagulation for ~2 years    PE (pulmonary embolism)     post-op after CABG, on anticoagulation for ~2 years    Prediabetes     Tubular adenoma 03/2015    colonoscopy        SURGICAL HISTORY        Past Surgical History:   Procedure Laterality Date    ACHILLES TENDON SURGERY      APPENDECTOMY      CHOLECYSTECTOMY      CORONARY ANGIOPLASTY WITH STENT PLACEMENT  2012     CORONARY ARTERY BYPASS GRAFT  2013    triple     CYST REMOVAL      both thumbs    PR REPAIR EYE BLOWOUT,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    Procedure: ORBIT ORIF;  Surgeon: Jannett Celestine, DDS;  Location: [facility] MAIN OR;  Service: OMFS    TONSILLECTOMY AND ADENOIDECTOMY         FAMILY HISTORY        Family History   Problem Relation Age of Onset    Other Mother         "rare lung disease"    Aneurysm Mother         abdominal aortic aneurysm    Diabetes Type II Mother     Breast cancer Mother     Blood clots Mother     Stroke Father     Stroke Brother         x2    Thyroid cancer Daughter         SOCIAL HISTORY   reports that she has never smoked. She has never used smokeless tobacco. She reports that she does not drink alcohol and does not use drugs.     ALLERGIES  Morphine  MEDICATIONS       Current Outpatient Medications   Medication Sig    fluticasone-umeclidinium-vilanterol (TRELEGY ELLIPTA) 200-62.5-25 MCG/INH diskus inhaler Inhale 1 puff into the lungs daily    gabapentin (GABAPENTIN) 300 mg capsule Take 1 capsule (300 mg total) by mouth 3 times daily    clopidogrel (PLAVIX) 75 mg tablet     sertraline (ZOLOFT) 100 mg tablet Take 1 tablet (100 mg total) by mouth daily  Replacing 50mg  dose, please cancel other refills    atorvastatin (LIPITOR) 40 mg tablet Take 1 tablet (40 mg total) by mouth daily (with dinner)    montelukast (SINGULAIR) 10 mg tablet Take 1 tablet (10 mg total) by mouth nightly    docusate sodium (COLACE) 100 MG capsule Take 100 mg by mouth 2 times daily    polyethylene glycol (GLYCOLAX,MIRALAX) 17 g powder packet Take by mouth daily as needed    tamsulosin (FLOMAX) 0.4 MG capsule Take 1 capsule (0.4 mg total) by mouth every evening    pantoprazole (PROTONIX) 40 MG EC tablet Take 1 tablet (40 mg total) by mouth daily (before breakfast)  Swallow whole. Do not crush, break, or chew.    acetaminophen (TYLENOL) 500 mg tablet Take 2 tablets (1,000 mg  total) by mouth 3 times daily    ibuprofen (ADVIL,MOTRIN) 600 MG tablet Take 1 tablet (600 mg total) by mouth 3 times daily as needed for Pain    senna (SENOKOT) 8.6 MG tablet Take 2 tablets by mouth daily    ARTIFICIAL TEARS (LUBRIFRESH PM,GENTEAL PM) ointment Please administer every 6 hours to both eyes    albuterol HFA (PROVENTIL, VENTOLIN, PROAIR HFA) 108 (90 Base) MCG/ACT inhaler Inhale 2 puffs into the lungs every 4-6 hours as needed    azelastine (ASTELIN) 0.1 % nasal spray 1-2 sprays by Nasal route 2 times daily    Spacer/Aero-Holding Chambers (EASIVENT) spacer Use twice daily with Symbicort inhaler. Breathe in slowly.    fluticasone (FLONASE) 50 MCG/ACT nasal spray 2 sprays by Nasal route daily    Non-System Medication Medication/Supply: R thumb spica brace  Directions for Use: wear nightly    ipratropium-albuterol (DUONEB) 0.5-2.5mg  /68mL nebulizer solution Take 3 mLs by nebulization every 6 hours    Misc. Devices (CPAP) machine Download and compliance report    Cholecalciferol (VITAMIN D3) 5000 UNITS TABS Take 5,000 Units by mouth every morning       ipratropium (ATROVENT) 0.06 % nasal spray 2 sprays by Each Nare route 3 times daily     No current facility-administered medications for this visit.        Objective:      BP 112/70 (BP Location: Left arm, Patient Position: Sitting)    Ht 1.702 m (5\' 7" )    Wt 111.6 kg (246 lb)    LMP  (LMP Unknown)    BMI 38.53 kg/m      Imaging:  Interpretation Summary  History: Carotid disease  Prior studies: 9.2021    RightInternal Carotid Artery (ICA)  Severe stenosis (80-99%), stable from prior.  Right vertebral artery is antegrade.    LeftInternal Carotid Artery (ICA)  Severe stenosis (80-99%), stable from prior.  Left vertebral artery is antegrade.      07/23/2020 CT angio head and neck     Mixed density and ulcerated atherosclerotic plaques causing High-grade stenoses of the right and left internal carotid arteries just distal to the carotid  bifurcations resulting in 85% luminal  stenosis on the right and 80% luminal stenosis on  the left by NASCET criteria.    Physical Exam:      General appearance: healthy, alert, active and no distress  HEENT: Normocephalic, bruise to right forehead  CV: regular rate and rhythm and no carotid bruit  Lungs: CTA bilaterally, respirations unlabored  Abd: soft, non-tender  Extremities: Bilateral lower extremities are warm and well perfused without any evidence of clubbing, cyanosis, or limb ischemia. Intact DP and PT pulses. No evidence of venous ulceration, lipodermatosclerosis, or edema.   Neuro: normal without focal findings, mental status, speech normal, alert and oriented x3 and sensation grossly normal    Assessment:   [name] is a 75 y.o. female with known severe bilateral carotid artery stenosis. Currently asymptomatic     Plan:     Carotid artery stenosis   I personally reviewed images of the carotid duplex and CTA which showed severe bilateral carotid artery stenosis   Reviewed results of imaging with patient - as above   Discussed CEA vs TCAR.  Risks, benefits and alternatives to both.  Patient and family wish to proceed with TCAR.  Will start on R side first given worse disease.   IPOCC completed today   Continue antiplatelet and statin therapy    Seek immediate medical attention for any signs or symptoms of stroke or TIA

## 2020-08-01 ENCOUNTER — Encounter: Payer: Self-pay | Admitting: Primary Care

## 2020-08-02 ENCOUNTER — Encounter: Payer: Self-pay | Admitting: Internal Medicine

## 2020-08-02 ENCOUNTER — Ambulatory Visit: Payer: Medicare (Managed Care) | Attending: Internal Medicine | Admitting: Internal Medicine

## 2020-08-02 VITALS — BP 134/82 | HR 77 | Temp 98.5°F | Resp 16 | Ht 66.93 in | Wt 237.4 lb

## 2020-08-02 DIAGNOSIS — R300 Dysuria: Secondary | ICD-10-CM

## 2020-08-02 DIAGNOSIS — K661 Hemoperitoneum: Secondary | ICD-10-CM

## 2020-08-02 DIAGNOSIS — N390 Urinary tract infection, site not specified: Secondary | ICD-10-CM | POA: Insufficient documentation

## 2020-08-02 LAB — POCT URINALYSIS DIPSTICK
Bilirubin,Ur: NEGATIVE
Glucose,UA POCT: NORMAL mg/dL
Ketones,UA POCT: NEGATIVE mg/dL
Leuk Esterase,UA POCT: 2 — AB
Lot #: 54510303
Nitrite,UA POCT: NEGATIVE
PH,UA POCT: 6 (ref 5–8)
Specific gravity,UA POCT: 1.015 (ref 1.002–1.030)
Urobilinogen,UA: NORMAL mg/dL

## 2020-08-02 MED ORDER — CIPROFLOXACIN HCL 250 MG PO TABS *I*
250.0000 mg | ORAL_TABLET | Freq: Two times a day (BID) | ORAL | 0 refills | Status: AC
Start: 2020-08-02 — End: 2020-08-07

## 2020-08-02 NOTE — Telephone Encounter (Signed)
Would recommend appointment here given her back pain to make sure it's not a kidney infection.

## 2020-08-02 NOTE — Telephone Encounter (Signed)
Spoke with patient informed of recommendations. Appointment given for this afternoon.

## 2020-08-02 NOTE — Addendum Note (Signed)
Addended by: Judeth Cornfield on: 08/02/2020 04:47 PM     Modules accepted: Orders

## 2020-08-02 NOTE — Progress Notes (Signed)
SUBJECTIVE    [name] is a 75 y.o. female who presents for Urinary Tract Infection    75 year old white female presents with acute urinary tract symptoms, recurrent urinary tract infections, retroperitoneal hematoma    Regarding acute urinary tract symptoms, she reports a 3-week history of frequency, urgency.  No dysuria, no sense of incomplete void.  No fevers.    When queried about back pain she notes a mid back discomfort, but it sounds more musculoskeletal.    Denies abdominal pain    Regarding recurrent urinary tract infections, chart review confirms that this is her fifth presentation with urinary tract symptoms since last June.  Her first cultured Proteus, the last 3 Escherichia coli.    She did have an indwelling catheter this past June-there was a traumatic injury requiring same.  She has had a number of CT scans of the abdomen-these have revealed a retroperitoneal hematoma, at one point she did have some hydronephrosis-but bladder anatomy is never remarked is abnormal, and she is not had a postvoid residual checked.    She has seen urology, most recently a telemedicine visit this past February.    Regarding retroperitoneal hematoma, a urology note from this past February reviewed the CT angio of the abdomen and pelvis which showed "stable fat stranding/hematoma near the upper abdominal aorta.  Increased hematoma in the left retroperitoneum and abutting the psoas muscle" there was "wall thickening of the proximal to mid ureter without contrast extravasation, mild associated left hydronephrosis"    Past Medical, Family and Social History  Patient's medications, allergies, past medical, surgical, social and family histories were updated and marked as reviewed, as appropriate.    Review of Systems   Constitutional: Negative for fever.   Gastrointestinal: Negative for abdominal pain.   Genitourinary: Positive for frequency and urgency. Negative for dysuria, flank pain and hematuria.    Musculoskeletal: Positive for back pain.         OBJECTIVE    BP 134/82 (BP Location: Left arm, Patient Position: Sitting)    Pulse 77    Temp 36.9 C (98.5 F) (Temporal)    Resp 16    Ht 1.7 m (5' 6.93")    Wt 107.7 kg (237 lb 6.4 oz)    LMP  (LMP Unknown)    SpO2 98%    BMI 37.26 kg/m     VITALS: reviewed    Physical Exam  On exam she is friendly, well-appearing, no evident distress, cooperative throughout.  Nurse vitals noted above.  She is somewhat slow moving, she is overweight for height    On exam there is no CVAT  Abdomen-generous but soft.  Nontender to deep palpation throughout.  No masses.  No organomegaly.    Urinalysis 2+ leukocyte esterase positivity, negative nitrite, about 50 heme    ASSESSMENT & PLAN    1. Dysuria  - POCT Urinalysis Dipstick    2. UTI (urinary tract infection)    3. Recurrent UTI    4. Retroperitoneal hematoma      Medication precautions reviewed.    Clinical impression is that of acute urinary tract symptoms of several weeks duration, not resolving.  Patient has not attempted any true therapy.  Will provide empiric Cipro while we await culture given her prior culture results    Clinical impression is that of chronic recurrent urinary tract infections, now 5 of them over the past 8 months.  I do discussed this with the patient's-she may simply not be clearing her  urinary tract infections, or there may be some post void residual issues.  She does have an upcoming visit with her PMD and she will discuss all at that time    Clinical impression is that of retroperitoneal hematoma which is being monitored elsewhere.  Although she has been on them in the past, patient is not on any blood thinning agents at this time    She is appreciative and understanding of all    She otherwise will stay in touch with Dr. Laurance Flatten regarding any progression of current issues or onset of new issues    Dictation not checked for errors  Encouraged follow up by phone/message if needed within a week.

## 2020-08-03 ENCOUNTER — Encounter: Payer: Self-pay | Admitting: Primary Care

## 2020-08-03 NOTE — Telephone Encounter (Signed)
Currently taking atorvastatin 80 mg daily.  Prescribed CIPRO yesterday by locum provider.  Please advise.

## 2020-08-04 LAB — AEROBIC CULTURE

## 2020-08-21 ENCOUNTER — Ambulatory Visit: Payer: Self-pay

## 2020-08-21 DIAGNOSIS — Z01818 Encounter for other preprocedural examination: Secondary | ICD-10-CM

## 2020-08-21 NOTE — Progress Notes (Signed)
ANTICOAGULATION PLAN APPROVED BY PRESCRIBING PHYSICIAN     ANTICOAGULATION PLAN FOR SURGERY    DATE OF SURGERY:  09/04/20    SURGEON: Dr. Tsosie Billing    ANTICOAGULANT: Plavix, Aspirin    HOLD TIME AND PLAN: per vascular protocol, no need to hold

## 2020-08-24 ENCOUNTER — Other Ambulatory Visit: Payer: Self-pay | Admitting: Vascular Surgery

## 2020-08-24 DIAGNOSIS — I6529 Occlusion and stenosis of unspecified carotid artery: Secondary | ICD-10-CM

## 2020-08-25 ENCOUNTER — Ambulatory Visit
Admission: RE | Admit: 2020-08-25 | Discharge: 2020-08-25 | Disposition: A | Discharge status: Home / Self Care | Payer: Medicare (Managed Care) | Source: Ambulatory Visit | Attending: Vascular Surgery | Admitting: Vascular Surgery

## 2020-08-25 DIAGNOSIS — Z01818 Encounter for other preprocedural examination: Secondary | ICD-10-CM | POA: Insufficient documentation

## 2020-08-25 LAB — CBC AND DIFFERENTIAL
Baso # K/uL: 0.1 10*3/uL (ref 0.0–0.1)
Basophil %: 0.8 %
Eos # K/uL: 0.2 10*3/uL (ref 0.0–0.4)
Eosinophil %: 2.6 %
Hematocrit: 39 % (ref 34–45)
Hemoglobin: 12.4 g/dL (ref 11.2–15.7)
IMM Granulocytes #: 0 10*3/uL (ref 0.0–0.0)
IMM Granulocytes: 0.4 %
Lymph # K/uL: 1.6 10*3/uL (ref 1.2–3.7)
Lymphocyte %: 22.3 %
MCH: 29 pg (ref 26–32)
MCHC: 32 g/dL (ref 32–36)
MCV: 91 fL (ref 79–95)
Mono # K/uL: 0.5 10*3/uL (ref 0.2–0.9)
Monocyte %: 7.2 %
Neut # K/uL: 4.9 10*3/uL (ref 1.6–6.1)
Nucl RBC # K/uL: 0 10*3/uL (ref 0.0–0.0)
Nucl RBC %: 0 /100 WBC (ref 0.0–0.2)
Platelets: 264 10*3/uL (ref 160–370)
RBC: 4.3 MIL/uL (ref 3.9–5.2)
RDW: 13.5 % (ref 11.7–14.4)
Seg Neut %: 66.7 %
WBC: 7.4 10*3/uL (ref 4.0–10.0)

## 2020-08-25 LAB — URINALYSIS WITH REFLEX TO CULTURE
Blood,UA: NEGATIVE
Glucose,UA: NEGATIVE
Ketones, UA: NEGATIVE
Leuk Esterase,UA: NEGATIVE
Nitrite,UA: NEGATIVE
Protein,UA: NEGATIVE
Specific Gravity,UA: 1.021 (ref 1.002–1.030)
pH,UA: 7 (ref 5.0–8.0)

## 2020-08-25 LAB — MRSA (ORSA) AMPLIFICATION: MRSA (ORSA) Amplification: 0

## 2020-08-25 LAB — LDL CHOLESTEROL, DIRECT: LDL Direct: 95 mg/dL

## 2020-08-25 LAB — TYPE AND SCREEN
ABO RH Blood Type: O POS
Antibody Screen: NEGATIVE

## 2020-08-25 LAB — PROTIME-INR
INR: 1 (ref 0.9–1.1)
Protime: 11.4 s (ref 10.0–12.9)

## 2020-08-25 LAB — ASPIRIN PLATELET INHIBITION: Aspirin PLT Inhibition: 568 {ARU}

## 2020-08-25 LAB — P2Y12 PLATELET INHIBITION ASSAY: Platelet Function P2Y12: 142 [PRU] — ABNORMAL LOW (ref 194–418)

## 2020-08-25 NOTE — Anesthesia Preprocedure Evaluation (Addendum)
Anesthesia Pre-operative History and Physical for [name]  History and Physical Performed at [facility]  Highlighted Issues for this Procedure:  [name] is a 75 y.o. female with Carotid artery stenosis (I65.29) presenting for Procedure(s):  CAROTID ANGIOGRAM(TCAR) by Surgeon(s):  Randolm Idol, MD scheduled for 180 minutes.    Allergies:  -- Morphine -- Hives   --  Has tolerated Percocet    Habitus:  Estimated body mass index is 38.39 kg/m as calculated from the following:   Height as of 08/25/20: 1.702 m (5' 7").   Weight as of 08/25/20: 111.2 kg (245 lb 1.6 oz).    COVID-19 Lab Results:  Lab Results      Component                Value               Date/Time                 COVID-19 PCR             NEGATIVE            08/31/2020 1357         Medical History:  Severe bilateral internal carotid stenosis- 80-99%  CAD- s/p CABG in 2012, PCI to LAD 12/2011  HTN  Hx of PE, most recently 12/2019 after fall. Was on eliquis that has since been discontinued  Moderate to severe Aortic stenosis- AV area 0.99cm2, mean gradient 40mhg  COPD- trellegy; most recent PFT normal  Severe OSA- CPAP nightly  Cervical spine fracture s/p fall 10/2019      Anesthetic history:  01/04/16-  ORBIT ORIF-GETA.Mac 3. Grade IIa view. 7.0 ETT. No issues       Stress Test/Echocardiography:  06/12/20 nuclear stress  Conclusions   -Negative Lexiscan nuclear stress test for ischemia or infarct.   - Normal left ventricular size and systolic function, EF 636%   - No ischemic ECG changes with stress.   - No chest pain during the test.   - Brief run of 2:1 AV block with Lexiscan.     04/18/20 Echo Conclusions   - Left ventricularhypertrophy with normal systolic function, EF 662%   - Normal right ventricle.   - Moderately dilated left atrium. Mildly dilated right atrium.   - Moderate aortic stenosis.     01/04/20 Echo Interpretation Summary  1.  Concentric LVH and left atrial enlargement  2.  Normal LVEF without regional wall  motion abnormalities.   3.  Moderate to severe aortic valve stenosis   4.  Mitral valve sclerosis without significant stenosis  5.  Mild right ventricular enlargement/dysfunction with estimated mildly elevated pulmonary artery systolic pressure   6.  Mildly dilated ascending aorta    Electrophysiology/AICD/Pacer:  06/07/20 Carotis ultrasound Interpretation Summary  Right Internal Carotid Artery (ICA)  Severe stenosis (80-99%), stable from prior.  Right vertebral artery is antegrade.    Left Internal Carotid Artery (ICA)  Severe stenosis (80-99%), stable from prior.  Left vertebral artery is antegrade.        .  [facility] Summary:  [name] presents preoperatively for anesthesia evaluation prior to CAROTID ANGIOGRAM(TCAR) (Right ) - TCAR on 09/04/20 with Dr. JTsosie Billing     She has a Past Medical History:  CAD s/p CABG 2012, PCI to LAD in 8/13- Plavix     [facility] [place] - numerous co-morbidities. CAD stable. Nuclear stress  test is low risk overall.   Severe Carotid stenosis - bil - for planned procedure   HTN- (metoprolol stopped for hypotension) - ongoing dizziness- cardiology aware   DVT/PE - post-op after CABG, Repeat PE 12/2019(after fall - ICU for several wks).  Was on Eliquis which has now been discontinued. SNF x6 weeks.   Aortic stenosis - moderate AVA 1.0 cm^2  OSA- CPAP qhs setting auto   HLD-statin   COPD/Asthma- Singular, last used neb in rehab, Trelegy daily, rescue albuterol prn 2-3x vs none in a week.   Obesity BMI 38.5   Prediabetes  Allergic rhinitis  Multiple UTI recently   Depression- sertraline  Fall 10/2019 - cervical spine fx- fracture extends into the right C2 pedicle and right C2 lateral mass.     The patient has no dyspnea, is moderately active, climbs stairs and can lie flat. Sleeps with 3 pillows for neck pain, not breathing.      By Latoya Cape, NP at 7:39 AM on 08/25/2020    Anesthesia Evaluation Information Source: patient,  records     ANESTHESIA HISTORY  Pertinent(-):  No History of anesthetic complications or Family hx of anesthetic complications    GENERAL    + Obesity    + Infection  Comment: No fever, chills, night sweats. No unexplained change in weight.  Pertinent (-):  No substance abuse, history of anesthetic complications or Family Hx of Anesthetic Complications    HEENT    + Visual Impairment    + Hearing Loss (mild loss)    + Neck Pain  Pertinent (-):  No TMJD PULMONARY    + COPD          mild    + Asthma          inhaler daily    + Sleep apnea          CPAP  Pertinent(-):  No smoking, shortness of breath, recent URI or cough/congestion    CARDIOVASCULAR  Good(4+METs) Exercise Tolerance    + Hypertension    + Cardiac Testing          echo    + CABG    + CAD    + Coronary intervention          angioplasty, stent    + Valvular heart disease          AS, moderate, severe    + Anticoagulants          ASA, clopidrogel    + Vascular Issues    + Hx of DVT  Pertinent(-):  No past MI, angina, dysrhythmias or CHF    GI/HEPATIC/RENAL   NPO: > 8hrs ago (solids) and > 2hrs ago (clears) Enter Last PO Intake in ROS/Med Hx Tab      + GERD          well controlled  Pertinent(-):  No alcohol use, bowel issues or renal issuesProstate Issues: UTI.    + Esophageal Issues (dysphagia- worsening- prior dilation ) NEURO/PSYCH    + Neuropsychiatric Issues          depression    + Positioning issues (c-spine fx 10/2019)  Pertinent(-):  No seizures, cerebrovascular event or gait/mobility issues    ENDO/OTHER    + Diabetes Mellitus ( diet controlled )         HgA1c: 6.1  Pertinent(-):  No thyroid disease, chemo Hx, menstruating (post-menopause)    HEMATOLOGIC    + Anticoagulants/Antiplatelets  ASA, clopidrogel    + Blood dyscrasia          hyperlipidemia    + Arthritis          neck  Pertinent(-):  No bruising/bleeding easily         Physical Exam    Airway            Mouth opening: limited            Mallampati: III            TM distance (fb):  >3 FB            Neck ROM: limited            Comment: Tight 3 FB mouth opening   Mild restriction with cervical spine rotation to right             Airway Impression: challenging  Dental   Normal Exam       Comment: No loose or broken teeth. A few capped molars   Cardiovascular  Normal Exam           Rhythm: regular           Rate: normal    + Murmur (USB)  3/6 and systolic  No peripheral edema      Neurologic    Normal Exam    General Survey    Normal Exam   Pulmonary   Normal Exam    breath sounds clear to auscultation    Mental Status   Normal Exam    oriented to person, place and time    + Anxious     Patient Education from [facility] Provider:  Patient Education:  The following items were discussed with [name] to her satisfaction and comprehension:  The facility's NPO guidelines were discussed  To call the surgeon if she becomes ill prior to surgery  All questions were answered  Medications DOS with sip of water: per AVS  Hold medications AM day of surgery: per AVS  Nurse reviewed additional items as indicated in the education record.  [name] verbalized knowledge and teaching objectives met.  No barriers to learning identified.  IV insertion was reviewed with her.  The importance of coughing and deep breathing was emphasized.  Chlohexidine scrub instructions reviewed.    ________________________________________________________________________  PLAN  ASA Score  4  Anesthetic Plan general       Induction (routine IV) General Anesthesia/Sedation Maintenance Plan (inhaled agents, IV bolus and neuromuscular blockade);  Airway Manipulation (video laryngoscope and In-line stabilization); Airway (cuffed ETT); Line ( use current access and A- line); Monitoring (standard ASA and preinduction arterial); Positioning (supine and arms tucked); PONV Plan (dexamethasone and ondansetron); Pain (per surgical team); PostOp (PACU)    Informed Consent     Risks:         Risks discussed were  commensurate with the plan listed above with the following specific points: N/V, aspiration, sore throat and hypotension, Damage to: eyes, teeth, nerves and blood vessels, allergic Rx, unexpected serious injury and death.    Anesthetic Consent:         Anesthetic plan (and risks as noted above) were discussed with patient    Blood products Consent:        Use of blood products discussed with: patient and they consented    Plan also discussed with team members including:       resident and attending    Responsible Anesthesia Provider Attestation:  I attest that the patient or proxy understands and accepts the risks and benefits of the anesthesia plan. I also attest that I have personally performed a pre-anesthetic examination and evaluation, and prescribed the anesthetic plan for this particular location within 48 hours prior to the anesthetic as documented. Nira Retort, MD  09/04/20, 7:22 AM

## 2020-08-25 NOTE — Discharge Instructions (Signed)
[facility] Preoperative Instructions            Patient Name: [name] [facility]  Surgery Date:  Monday, April 25        When to Arrive for Surgery         On the day prior to your surgery, Friday, April 22, you will find out your arrival time. [place] - Please call [phone] between 2:30 and 7:00 p.m. .        Note: Patients scheduled for a procedure on Monday will be assigned an arrival time on the Friday before. Please note surgery start time is approximate. You may want to bring something to help pass the time.        PLEASE ARRIVE ON TIME.        Directions to [facility]: On the day of your procedure, park in the parking garage and take the elevator/stairs to Level One (1), then follow the walkway to the [place].  Walk past the Information Desk in the lobby, towards the Lab & Outpatient Services.  Follow the GREEN (R) ceiling tags to the GREEN elevators. (Valet parking is available outside the front entrance of the hospital between 6:00 AM and 5:00 PM and assistance is available at the information desk, if needed).   Continue to the [facility]): Take the GREEN (G) elevators to the Basement (Level B - Two floors down) to the [facility] and check in with the receptionist at the desk.        Eating Guidelines        Follow the instructions below unless otherwise instructed by your physician.        No solid food AFTER MIDNIGHT on the day of your surgery. No candy, gum, mints or chewing tobacco.        You can have clear liquids up to 4 hours before your surgery. This includes water, apple juice,  clear carbonated beverages,  black coffee,  clear tea. No milk, cream, or non-dairy creamers.         Failure to follow these instructions, could lead to a delay or cancellation of your procedure.        Medication Guidelines        On the morning of surgery, take only the medications indicated on the  Preoperative Medication List above.        Medications should only be taken with no more than one ounce of water.        Hold any herbal supplements 5-7 days prior to surgery.  You may take Tylenol (Acetaminophen) if needed.  Continue taking Aspirin as directed.  You may continue taking non-steroidal anti-inflammatory agents such as Ibuprofen (Advil, Motrin) or Naproxen (Aleve).  Please bring your Inhaler and CPAP with you on the day of surgery.   Do not take powder supplements or Metamucil on the day of the procedure.        Additional Information        What to bring or wear:        Bring Photo ID and insurance information.        Eye glasses and/or hearing aids:  These may be removed prior to surgery so be prepared to leave them with a trusted family member.        Do NOT wear contact lenses.        Wear comfortable, loose  fit clothing.          You must arrange a ride home before coming to surgery.        What NOT to bring or wear:        Before coming to the hospital, remove all makeup (including mascara), jewelry (including wedding band and watch), hair accessories and nail polish from toes and fingers.  Do not bring any valuables (money, wallet, purse, jewelry, or contact lenses.)        Information for After Surgery:    [place] [phone]    Visiting Hours  Visiting hours are between 8 a.m. and 8 p.m.  Visitation is limited to up to 4 consecutive hours per visitor, per day.    COVID infection rates have declined in the [place] region, enabling [facility] to expand visitation in many areas of the hospital beginning at 8 a.m. March 2.    The biggest change: the hospital will now allow two visitors for the duration of an inpatients stay, with one visitor permitted per day. Additionally:     ED and Observation Units: one visitor is allowed per patient throughout the ED visit when volume and social distancing allows. If waiting room is too busy, visitors will have to be asked to  leave.   For OB patients in labor: one identified support person and one designated visitor during labor. After delivery, the support person and the one visitor may continue to visit throughout regular visiting hours.   Cardiac Cath Lab or Electrophysiology: one visitor may accompany patient during pre-surgical process; the visitor may remain socially distanced in designated waiting room during the surgery.   Dialysis Unit, [place], Interventional Radiology: no visitors permitted, as space does not allow for social distancing.    Exceptions remain in place; visitors should consult the hospitals updated web site before visiting.    Guidelines for Patients Having Surgery   1 visitor may accompany the patient during the pre-surgical process.    1 support person can remain socially distanced in the surgical waiting room.    If the patient is admitted, visitation/exception guidelines will be followed.    All visitors must be at least 74 years old.  Exception guidelines will still be followed.          Your family will be notified when your surgery is completed and you have arrived on the patient care unit.        Expected Length of Stay:        SDA Admission:  You are being admitted to the hospital after surgery. Please leave any luggage in your car until after your procedure.  Your family can bring this into the hospital once you are in your room.        Health standards require that a responsible adult must accompany any patient who has received anesthetics or sedation and is going home the same day. You must arrange a ride home before coming to surgery.        Questions:    Please call the [facility] at [phone] between 8:00 a.m. and 4:00 p.m. Monday through Friday. You were seen today by Cathren Harsh, NP. .                Surgical Site Infections FAQs        What is a Surgical Site infection (SSI)?  A surgical site infection is an infection that occurs after surgery In  the  part of the body where the surgery took place. Most patients who have surgery do not develop an infection. However, Infections develop in about 1 to 3 out of every 100 patients who have surgery.         Some of the common symptoms of a surgical site infection are:    Redness and pain around the area where you had surgery    Drainage of cloudy fluid from your surgical wound    Fever         Can SSIs be treated?   Yes. Most surgical site infections can be treated with antibiotics. The antibiotic given to you depends on the bacteria (germs) causing the Infection. Sometimes patients with SSIs also need another surgery to treat the infection.         What are some of the things that hospitals are doing to prevent SSls?   To prevent SSIs, doctors, nurses, and other healthcare providers:    Clean their hands and arms up to their elbows with an antiseptic agent just before the surgery.    Clean their hands with soap and water or an alcohol-based hand rub before and after caring for each patient.    May remove some of your hair Immediately before your surgery using electric clippers If the hair Is in the same area where the procedure will occur. They should not shave you with a razor.    Wear special hair covers, masks, gowns, and gloves during surgery to keep the surgery area clean.    Give you antibiotics before your surgery starts. in most cases, you should get antibiotics within 60 mInutes before the surgery starts and the antibiotics should be stopped within 24 hours after surgery.    Clean the skin at the site of your surgery with a special soap that kills germs.         What can I do to help prevent SSIs?   Before your surgery:    Tell your doctor about other medical problems you may have. Health problems such as allergies, diabetes, and obesity could affect your surgery and your treatment.    Quit smoking. Patients who smoke get more Infections. Talk to your doctor about how you can quit before your  surgery.    Do not shave near where you will have surgery. Shaving with a razor can Irritate your skin and make it easier to develop an infection.         At the time of your surgery:    Speak up if someone tries to shave you with a razor before surgery. Ask why you need to be shaved and talk with your surgeon if you have any concerns.    Ask if you will get antibiotics before surgery.         After your surgery:    Make sure that your healthcare providers clean their hands before examining you, either with soap and water or an alcohol-based hand rub.   If you do not see your healthcare providers wash their hands,   please ask them to do so.     Family and friends who visit you should not touch the surgical wound or dressings.    Family and friends should clean their hands with soap and water or an alcohol-based hand rub before and after visiting you. If you do not see them clean their hands, ask them to clean their hands.   What do I need to do when I go home  from the hospital?    Before you go home, your doctor or nurse should explain everyt hing you need to know about taking care of your wound. Make sure you understand how to care for your wound before you leave the hospital.    Always clean your hands before and after caring for your wound.    Before you go home, make sure you know who to contact If you have questions or problems after you get home.    If you have any symptoms of an Infection, such as redness and pain at the surgery site, drainage, or fever, call your doctor immediately.   if you have additional questions, Please ask your doctor or nurse.        Name:__________________ _  Surgery date: ________________     [place]  Preoperative Vascular Surgery Patient Instructions  PREOPERATIVE SCRUBS     Welcome to [place] are anticipating having surgery with [place], and there are a few important things that you can do to help [place] care for you. Infection is a  potential risk with any surgery and we have identified some ways that you can help [place] lessen your risk:    YOUR SKIN - Germs normally live on your skin, and pose no risk to you, however when you have surgery, an incision is made into your skin which interrupts the normal protection that skin provides. In order to decrease the germs on your skin, we have you take two showers or baths, one the night before your surgery and one the morning of the surgery. Shower or bathe first using your regular shampoo and soap/body wash. Rinse your body prior to use of the sponge with special germ killing soap (Chlorhexidine soap). If you cannot take a shower, then you should wash the area of your surgery with the special soap or wipe that we will give you. Gently wash the area for 30 seconds each from behind Right Ear down to R/L toes.    Use Caution: Do not get the chlorhexadine in your eyes.   It has been reported that the chlorhexadine can make the shower slippery.    Rinse after washing the areas and dry with a freshly laundered towel and put on freshly laundered clothing.  Do not apply any lotions, moisturizers, deodorant, perfume, cologne or makeup after using the sponge.    NO SHAVING! Shaving your skin with a razor blade can actually increase your risk of infection. Do not shave any area of your body 2 days prior to the surgery except for your face. We may prepare areas of your skin by removing hair with a special clipper.    QUESTIONS? PLEASE CALL [phone] and ask to speak with a Camera operator.

## 2020-08-27 LAB — HEMOGLOBIN A1C: Hemoglobin A1C: 6.1 % — ABNORMAL HIGH

## 2020-08-31 ENCOUNTER — Ambulatory Visit: Payer: Medicare (Managed Care) | Attending: Nurse Practitioner

## 2020-08-31 DIAGNOSIS — Z01812 Encounter for preprocedural laboratory examination: Secondary | ICD-10-CM | POA: Insufficient documentation

## 2020-08-31 DIAGNOSIS — Z20828 Contact with and (suspected) exposure to other viral communicable diseases: Secondary | ICD-10-CM | POA: Insufficient documentation

## 2020-08-31 DIAGNOSIS — Z20822 Contact with and (suspected) exposure to covid-19: Secondary | ICD-10-CM | POA: Insufficient documentation

## 2020-08-31 LAB — COVID-19 NAAT (PCR): COVID-19 NAAT (PCR): NEGATIVE

## 2020-09-04 ENCOUNTER — Inpatient Hospital Stay: Payer: Medicare (Managed Care) | Admitting: Student in an Organized Health Care Education/Training Program

## 2020-09-04 ENCOUNTER — Inpatient Hospital Stay
Admission: RE | Admit: 2020-09-04 | Discharge: 2020-09-06 | DRG: 036 | Disposition: A | Discharge status: Home Health | Payer: Medicare (Managed Care) | Source: Ambulatory Visit | Attending: Vascular Surgery | Admitting: Vascular Surgery

## 2020-09-04 ENCOUNTER — Encounter
Admission: RE | Disposition: A | Discharge status: Home Health | Payer: Self-pay | Source: Ambulatory Visit | Attending: Vascular Surgery

## 2020-09-04 ENCOUNTER — Inpatient Hospital Stay: Payer: Medicare (Managed Care)

## 2020-09-04 ENCOUNTER — Inpatient Hospital Stay: Payer: Medicare (Managed Care) | Admitting: Nurse Practitioner

## 2020-09-04 DIAGNOSIS — I6521 Occlusion and stenosis of right carotid artery: Secondary | ICD-10-CM

## 2020-09-04 DIAGNOSIS — R7303 Prediabetes: Secondary | ICD-10-CM | POA: Diagnosis present

## 2020-09-04 DIAGNOSIS — J45909 Unspecified asthma, uncomplicated: Secondary | ICD-10-CM | POA: Diagnosis present

## 2020-09-04 DIAGNOSIS — Z86711 Personal history of pulmonary embolism: Secondary | ICD-10-CM

## 2020-09-04 DIAGNOSIS — Z6838 Body mass index (BMI) 38.0-38.9, adult: Secondary | ICD-10-CM

## 2020-09-04 DIAGNOSIS — G4733 Obstructive sleep apnea (adult) (pediatric): Secondary | ICD-10-CM | POA: Diagnosis present

## 2020-09-04 DIAGNOSIS — Z955 Presence of coronary angioplasty implant and graft: Secondary | ICD-10-CM

## 2020-09-04 DIAGNOSIS — E669 Obesity, unspecified: Secondary | ICD-10-CM | POA: Diagnosis present

## 2020-09-04 DIAGNOSIS — Z9181 History of falling: Secondary | ICD-10-CM

## 2020-09-04 DIAGNOSIS — Z7901 Long term (current) use of anticoagulants: Secondary | ICD-10-CM

## 2020-09-04 DIAGNOSIS — I2510 Atherosclerotic heart disease of native coronary artery without angina pectoris: Secondary | ICD-10-CM | POA: Diagnosis present

## 2020-09-04 DIAGNOSIS — Z86718 Personal history of other venous thrombosis and embolism: Secondary | ICD-10-CM

## 2020-09-04 DIAGNOSIS — Z006 Encounter for examination for normal comparison and control in clinical research program: Secondary | ICD-10-CM

## 2020-09-04 DIAGNOSIS — F32A Depression, unspecified: Secondary | ICD-10-CM | POA: Diagnosis present

## 2020-09-04 DIAGNOSIS — Z951 Presence of aortocoronary bypass graft: Secondary | ICD-10-CM

## 2020-09-04 DIAGNOSIS — R001 Bradycardia, unspecified: Secondary | ICD-10-CM | POA: Diagnosis present

## 2020-09-04 DIAGNOSIS — M1990 Unspecified osteoarthritis, unspecified site: Secondary | ICD-10-CM | POA: Diagnosis present

## 2020-09-04 DIAGNOSIS — I6523 Occlusion and stenosis of bilateral carotid arteries: Principal | ICD-10-CM | POA: Diagnosis present

## 2020-09-04 DIAGNOSIS — K219 Gastro-esophageal reflux disease without esophagitis: Secondary | ICD-10-CM | POA: Diagnosis present

## 2020-09-04 DIAGNOSIS — J449 Chronic obstructive pulmonary disease, unspecified: Secondary | ICD-10-CM | POA: Diagnosis present

## 2020-09-04 DIAGNOSIS — I6529 Occlusion and stenosis of unspecified carotid artery: Secondary | ICD-10-CM

## 2020-09-04 DIAGNOSIS — I350 Nonrheumatic aortic (valve) stenosis: Secondary | ICD-10-CM | POA: Diagnosis present

## 2020-09-04 DIAGNOSIS — I10 Essential (primary) hypertension: Secondary | ICD-10-CM | POA: Diagnosis present

## 2020-09-04 HISTORY — PX: CAROTID ARTERY ANGIOPLASTY: SHX1300

## 2020-09-04 LAB — ACT LR, POCT: ACT LR, POCT: 270 s — ABNORMAL HIGH (ref 89–169)

## 2020-09-04 LAB — POCT GLUCOSE: Glucose POCT: 97 mg/dL (ref 60–99)

## 2020-09-04 SURGERY — ARTERIOGRAM, CAROTID
Anesthesia: General | Site: Neck | Laterality: Right | Wound class: Clean

## 2020-09-04 MED ORDER — OXYCODONE HCL 10 MG PO TABS *I*
10.0000 mg | ORAL_TABLET | ORAL | Status: DC | PRN
Start: 2020-09-04 — End: 2020-09-06

## 2020-09-04 MED ORDER — OXYCODONE HCL 5 MG/5ML PO SOLN *I*
ORAL | Status: AC
Start: 2020-09-04 — End: 2020-09-04
  Filled 2020-09-04: qty 5

## 2020-09-04 MED ORDER — SUGAMMADEX SODIUM 100 MG/1ML IV SOLN *WRAPPED*
INTRAVENOUS | Status: DC | PRN
Start: 2020-09-04 — End: 2020-09-04
  Administered 2020-09-04: 200 mg via INTRAVENOUS

## 2020-09-04 MED ORDER — FENTANYL CITRATE 50 MCG/ML IJ SOLN *WRAPPED*
INTRAMUSCULAR | Status: AC
Start: 2020-09-04 — End: 2020-09-04
  Filled 2020-09-04: qty 2

## 2020-09-04 MED ORDER — CLOPIDOGREL BISULFATE 75 MG PO TABS *I*
75.0000 mg | ORAL_TABLET | Freq: Every day | ORAL | Status: DC
Start: 2020-09-05 — End: 2020-09-06
  Administered 2020-09-05 – 2020-09-06 (×2): 75 mg via ORAL
  Filled 2020-09-04 (×2): qty 1

## 2020-09-04 MED ORDER — CEFAZOLIN 1000 MG IN STERILE WATER 10ML SYRINGE *I*
PREFILLED_SYRINGE | INTRAVENOUS | Status: AC
Start: 2020-09-04 — End: 2020-09-04
  Filled 2020-09-04: qty 20

## 2020-09-04 MED ORDER — HEPARIN SODIUM (PORCINE) 1000 UNIT/ML IJ SOLN *WRAPPED*
Status: DC | PRN
  Administered 2020-09-04: 8000 [IU] via INTRAVENOUS

## 2020-09-04 MED ORDER — ACETAMINOPHEN 500 MG PO TABS *I*
ORAL_TABLET | ORAL | Status: AC
Start: 2020-09-04 — End: 2020-09-04
  Filled 2020-09-04: qty 2

## 2020-09-04 MED ORDER — FENTANYL CITRATE 50 MCG/ML IJ SOLN *WRAPPED*
INTRAMUSCULAR | Status: DC | PRN
Start: 2020-09-04 — End: 2020-09-04
  Administered 2020-09-04 (×4): 50 ug via INTRAVENOUS

## 2020-09-04 MED ORDER — LACTATED RINGERS IV SOLN *I*
20.0000 mL/h | INTRAVENOUS | Status: DC
Start: 2020-09-04 — End: 2020-09-04
  Administered 2020-09-04: 20 mL/h via INTRAVENOUS

## 2020-09-04 MED ORDER — LIDOCAINE HCL (PF) 1 % IJ SOLN *I*
INTRAMUSCULAR | Status: AC
Start: 2020-09-04 — End: 2020-09-04
  Filled 2020-09-04: qty 30

## 2020-09-04 MED ORDER — GABAPENTIN 300 MG PO CAPSULE *I*
300.0000 mg | ORAL_CAPSULE | Freq: Three times a day (TID) | ORAL | Status: DC
Start: 2020-09-04 — End: 2020-09-06
  Administered 2020-09-04 – 2020-09-06 (×6): 300 mg via ORAL
  Filled 2020-09-04 (×6): qty 1

## 2020-09-04 MED ORDER — REVEFENACIN 175 MCG/3ML IN SOLN *I*
175.0000 ug | Freq: Every day | RESPIRATORY_TRACT | Status: DC
Start: 2020-09-04 — End: 2020-09-06
  Administered 2020-09-05 – 2020-09-06 (×2): 175 ug via RESPIRATORY_TRACT
  Filled 2020-09-04 (×3): qty 3

## 2020-09-04 MED ORDER — ACETAMINOPHEN 500 MG PO TABS *I*
1000.0000 mg | ORAL_TABLET | Freq: Three times a day (TID) | ORAL | Status: DC
Start: 2020-09-04 — End: 2020-09-06
  Administered 2020-09-04 – 2020-09-06 (×6): 1000 mg via ORAL
  Filled 2020-09-04 (×6): qty 2

## 2020-09-04 MED ORDER — LABETALOL HCL 20 MG/4 ML IV SOLN WRAPPED *I*
20.0000 mg | INTRAVENOUS | Status: AC | PRN
Start: 2020-09-04 — End: 2020-09-06

## 2020-09-04 MED ORDER — ACETAMINOPHEN 500 MG PO TABS *I*
1000.0000 mg | ORAL_TABLET | Freq: Once | ORAL | Status: AC
Start: 2020-09-04 — End: 2020-09-04
  Administered 2020-09-04: 1000 mg via ORAL

## 2020-09-04 MED ORDER — LIDOCAINE HCL 2 % IJ SOLN *I*
INTRAMUSCULAR | Status: DC | PRN
Start: 2020-09-04 — End: 2020-09-04
  Administered 2020-09-04: 80 mg via INTRAVENOUS

## 2020-09-04 MED ORDER — ONDANSETRON HCL 2 MG/ML IV SOLN *I*
4.0000 mg | Freq: Four times a day (QID) | INTRAMUSCULAR | Status: DC | PRN
Start: 2020-09-04 — End: 2020-09-06

## 2020-09-04 MED ORDER — SODIUM CHLORIDE 0.9 % IV SOLN WRAPPED *I*
20.0000 mL/h | Status: DC
Start: 2020-09-04 — End: 2020-09-04

## 2020-09-04 MED ORDER — ATORVASTATIN CALCIUM 80 MG PO TABS *I*
80.0000 mg | ORAL_TABLET | Freq: Every day | ORAL | Status: DC
Start: 2020-09-04 — End: 2020-09-06
  Administered 2020-09-05 – 2020-09-06 (×2): 80 mg via ORAL
  Filled 2020-09-04 (×2): qty 1

## 2020-09-04 MED ORDER — PROTAMINE SULFATE 10 MG/ML IV SOLN *I*
INTRAVENOUS | Status: DC | PRN
Start: 2020-09-04 — End: 2020-09-04
  Administered 2020-09-04: 50 mg via INTRAVENOUS

## 2020-09-04 MED ORDER — ACETAMINOPHEN 325 MG/10.15 ML WRAPPED *I*
1000.0000 mg | Freq: Once | Status: AC
Start: 2020-09-04 — End: 2020-09-04

## 2020-09-04 MED ORDER — BUDESONIDE 0.5 MG/2ML IN SUSP *I*
0.5000 mg | Freq: Two times a day (BID) | RESPIRATORY_TRACT | Status: DC
Start: 2020-09-04 — End: 2020-09-06
  Administered 2020-09-04 – 2020-09-06 (×4): 0.5 mg via RESPIRATORY_TRACT
  Filled 2020-09-04 (×6): qty 2

## 2020-09-04 MED ORDER — OXYCODONE HCL 5 MG/5ML PO SOLN *I*
10.0000 mg | Freq: Once | ORAL | Status: AC | PRN
Start: 2020-09-04 — End: 2020-09-04

## 2020-09-04 MED ORDER — IPRATROPIUM-ALBUTEROL 0.5-2.5 MG/3ML IN SOLN *I*
3.0000 mL | Freq: Once | RESPIRATORY_TRACT | Status: DC
Start: 2020-09-04 — End: 2020-09-04

## 2020-09-04 MED ORDER — HEPARIN SODIUM 5000 UNIT/ML SQ *I*
5000.0000 [IU] | Freq: Three times a day (TID) | SUBCUTANEOUS | Status: DC
Start: 2020-09-04 — End: 2020-09-06
  Administered 2020-09-04 – 2020-09-06 (×6): 5000 [IU] via SUBCUTANEOUS
  Filled 2020-09-04 (×6): qty 1

## 2020-09-04 MED ORDER — BUPIVACAINE-EPINEPHRINE 0.25 % IJ SOLUTION *WRAPPED*
INTRAMUSCULAR | Status: AC
Start: 2020-09-04 — End: 2020-09-04
  Filled 2020-09-04: qty 30

## 2020-09-04 MED ORDER — HEPARIN (PORCINE) IN 0.9% NACL 2 UNITS/ML IJ SOLN WRAPPED *I*
Status: AC
Start: 2020-09-04 — End: 2020-09-04
  Filled 2020-09-04: qty 500

## 2020-09-04 MED ORDER — SERTRALINE HCL 100 MG PO TABS *I*
100.0000 mg | ORAL_TABLET | Freq: Every day | ORAL | Status: DC
Start: 2020-09-05 — End: 2020-09-06
  Administered 2020-09-05 – 2020-09-06 (×2): 100 mg via ORAL
  Filled 2020-09-04 (×2): qty 1

## 2020-09-04 MED ORDER — PROPOFOL 10 MG/ML IV EMUL (INTERMITTENT DOSING) WRAPPED *I*
INTRAVENOUS | Status: DC | PRN
Start: 2020-09-04 — End: 2020-09-04
  Administered 2020-09-04: 20 mg via INTRAVENOUS

## 2020-09-04 MED ORDER — SODIUM CHLORIDE 0.9 % IV SOLN WRAPPED *I*
Status: DC | PRN
Start: 2020-09-04 — End: 2020-09-04
  Administered 2020-09-04: 1500 mL

## 2020-09-04 MED ORDER — DEXAMETHASONE SODIUM PHOSPHATE 4 MG/ML INJ SOLN *WRAPPED*
8.0000 mg | Freq: Two times a day (BID) | INTRAMUSCULAR | Status: AC
Start: 2020-09-04 — End: 2020-09-05
  Administered 2020-09-04 – 2020-09-05 (×2): 8 mg via INTRAVENOUS
  Filled 2020-09-04 (×2): qty 2

## 2020-09-04 MED ORDER — LABETALOL HCL 20 MG/4 ML IV SOLN WRAPPED *I*
INTRAVENOUS | Status: DC | PRN
Start: 2020-09-04 — End: 2020-09-04
  Administered 2020-09-04: 5 mg via INTRAVENOUS

## 2020-09-04 MED ORDER — OXYCODONE HCL 5 MG PO TABS *I*
5.0000 mg | ORAL_TABLET | ORAL | Status: DC | PRN
Start: 2020-09-04 — End: 2020-09-06

## 2020-09-04 MED ORDER — FENTANYL CITRATE 50 MCG/ML IJ SOLN *WRAPPED*
25.0000 ug | INTRAMUSCULAR | Status: DC | PRN
Start: 2020-09-04 — End: 2020-09-04
  Administered 2020-09-04: 25 ug via INTRAVENOUS

## 2020-09-04 MED ORDER — DOCUSATE SODIUM 100 MG PO CAPS *I*
100.0000 mg | ORAL_CAPSULE | Freq: Two times a day (BID) | ORAL | Status: DC
Start: 2020-09-04 — End: 2020-09-06
  Administered 2020-09-04 – 2020-09-06 (×4): 100 mg via ORAL
  Filled 2020-09-04 (×4): qty 1

## 2020-09-04 MED ORDER — HYDROMORPHONE HCL PF 1 MG/ML IJ SOLN *WRAPPED*
INTRAMUSCULAR | Status: AC
Start: 2020-09-04 — End: 2020-09-04
  Filled 2020-09-04: qty 0.5

## 2020-09-04 MED ORDER — ASPIRIN 81 MG PO TBEC *I*
81.0000 mg | DELAYED_RELEASE_TABLET | Freq: Every day | ORAL | Status: DC
Start: 2020-09-04 — End: 2020-09-06
  Administered 2020-09-05 – 2020-09-06 (×2): 81 mg via ORAL
  Filled 2020-09-04 (×2): qty 1

## 2020-09-04 MED ORDER — PLASMA-LYTE IV BOLUS *WRAPPED*
500.0000 mL | INTRAVENOUS | Status: AC | PRN
Start: 2020-09-04 — End: 2020-09-04
  Administered 2020-09-04 (×2): 500 mL via INTRAVENOUS

## 2020-09-04 MED ORDER — ONDANSETRON HCL 2 MG/ML IV SOLN *I*
INTRAMUSCULAR | Status: DC | PRN
Start: 2020-09-04 — End: 2020-09-04
  Administered 2020-09-04: 4 mg via INTRAVENOUS

## 2020-09-04 MED ORDER — CEFAZOLIN SODIUM 1000 MG IJ SOLR *I*
INTRAMUSCULAR | Status: DC | PRN
Start: 2020-09-04 — End: 2020-09-04
  Administered 2020-09-04: 2000 mg

## 2020-09-04 MED ORDER — ATROPINE SULFATE 0.1 MG/ML IJ SOLN *I*
0.5000 mg | INTRAMUSCULAR | Status: DC | PRN
Start: 2020-09-04 — End: 2020-09-05
  Filled 2020-09-04: qty 10

## 2020-09-04 MED ORDER — GLYCOPYRROLATE 0.2 MG/ML IJ SOLN *WRAPPED*
INTRAMUSCULAR | Status: DC | PRN
  Administered 2020-09-04 (×3): .2 mg via INTRAVENOUS

## 2020-09-04 MED ORDER — SUGAMMADEX SODIUM 100 MG/1ML IV SOLN *WRAPPED*
INTRAVENOUS | Status: AC
Start: 2020-09-04 — End: 2020-09-04
  Filled 2020-09-04: qty 2

## 2020-09-04 MED ORDER — PLASMA-LYTE IV SOLN *WRAPPED*
Status: DC | PRN
Start: 2020-09-04 — End: 2020-09-04

## 2020-09-04 MED ORDER — PHENYLEPHRINE 100 MCG/ML IN NS 10 ML *WRAPPED*
INTRAMUSCULAR | Status: DC | PRN
Start: 2020-09-04 — End: 2020-09-04
  Administered 2020-09-04: 100 ug via INTRAVENOUS
  Administered 2020-09-04 (×3): 50 ug via INTRAVENOUS

## 2020-09-04 MED ORDER — CEFAZOLIN 2000 MG IN STERILE WATER 20ML SYRINGE *I*
PREFILLED_SYRINGE | INTRAVENOUS | Status: AC
Start: 2020-09-04 — End: 2020-09-04
  Filled 2020-09-04: qty 20

## 2020-09-04 MED ORDER — HALOPERIDOL LACTATE 5 MG/ML IJ SOLN *I*
0.5000 mg | Freq: Once | INTRAMUSCULAR | Status: DC | PRN
Start: 2020-09-04 — End: 2020-09-04

## 2020-09-04 MED ORDER — ROCURONIUM BROMIDE 10 MG/ML IV SOLN *WRAPPED*
Status: DC | PRN
Start: 2020-09-04 — End: 2020-09-04
  Administered 2020-09-04: 50 mg via INTRAVENOUS

## 2020-09-04 MED ORDER — IPRATROPIUM-ALBUTEROL 0.5-2.5 MG/3ML IN SOLN *I*
3.0000 mL | Freq: Four times a day (QID) | RESPIRATORY_TRACT | Status: DC
Start: 2020-09-04 — End: 2020-09-06
  Administered 2020-09-04 – 2020-09-06 (×7): 3 mL via RESPIRATORY_TRACT
  Filled 2020-09-04 (×7): qty 3

## 2020-09-04 MED ORDER — IOHEXOL 240 MG/ML (OMNIPAQUE) IV SOLN *I*
INTRAMUSCULAR | Status: DC | PRN
Start: 2020-09-04 — End: 2020-09-04
  Administered 2020-09-04: 30 mL via INTRA_ARTERIAL

## 2020-09-04 MED ORDER — DEXAMETHASONE SODIUM PHOSPHATE 4 MG/ML INJ SOLN *WRAPPED*
INTRAMUSCULAR | Status: DC | PRN
Start: 2020-09-04 — End: 2020-09-04
  Administered 2020-09-04: 8 mg via INTRAVENOUS

## 2020-09-04 MED ORDER — PHENYLEPHRINE 80 MG (0.32MG/ML) IN NS 250 ML *I*
INTRAVENOUS | Status: DC | PRN
Start: 2020-09-04 — End: 2020-09-04
  Administered 2020-09-04: 50 ug/min via INTRAVENOUS

## 2020-09-04 MED ORDER — PANTOPRAZOLE SODIUM 40 MG PO TBEC *I*
40.0000 mg | DELAYED_RELEASE_TABLET | Freq: Every morning | ORAL | Status: DC
Start: 2020-09-05 — End: 2020-09-06
  Administered 2020-09-05 – 2020-09-06 (×2): 40 mg via ORAL
  Filled 2020-09-04 (×2): qty 1

## 2020-09-04 MED ORDER — DIPHENHYDRAMINE HCL 50 MG/ML IJ SOLN *I*
25.0000 mg | INTRAMUSCULAR | Status: DC | PRN
Start: 2020-09-04 — End: 2020-09-04

## 2020-09-04 MED ORDER — ETOMIDATE 2 MG/ML IV SOLN *I*
INTRAVENOUS | Status: DC | PRN
Start: 2020-09-04 — End: 2020-09-04
  Administered 2020-09-04: 18 mg via INTRAVENOUS

## 2020-09-04 MED ORDER — MONTELUKAST SODIUM 10 MG PO TABS *I*
10.0000 mg | ORAL_TABLET | Freq: Every evening | ORAL | Status: DC
Start: 2020-09-04 — End: 2020-09-06
  Administered 2020-09-04 – 2020-09-05 (×2): 10 mg via ORAL
  Filled 2020-09-04 (×2): qty 1

## 2020-09-04 MED ORDER — CEFAZOLIN 2000 MG IN STERILE WATER 20ML SYRINGE *I*
2000.0000 mg | PREFILLED_SYRINGE | Freq: Once | INTRAVENOUS | Status: AC
Start: 2020-09-04 — End: 2020-09-04
  Administered 2020-09-04: 2000 mg via INTRAVENOUS

## 2020-09-04 MED ORDER — OXYCODONE HCL 5 MG/5ML PO SOLN *I*
5.0000 mg | Freq: Once | ORAL | Status: AC | PRN
Start: 2020-09-04 — End: 2020-09-04
  Administered 2020-09-04: 5 mg via ORAL

## 2020-09-04 MED ORDER — ARFORMOTEROL TARTRATE 15 MCG/2ML IN NEBU *I*
15.0000 ug | INHALATION_SOLUTION | Freq: Two times a day (BID) | RESPIRATORY_TRACT | Status: DC
Start: 2020-09-04 — End: 2020-09-06
  Administered 2020-09-04 – 2020-09-06 (×4): 15 ug via RESPIRATORY_TRACT
  Filled 2020-09-04 (×7): qty 2

## 2020-09-04 MED ORDER — LIDOCAINE HCL 1% IJ SOLN *WRAPPED* (FOR LINE PLACEMENT ONLY)
0.1000 mL | INTRAMUSCULAR | Status: DC | PRN
Start: 2020-09-04 — End: 2020-09-04

## 2020-09-04 MED ORDER — FLUTICASONE PROPIONATE 50 MCG/ACT NA SUSP *I*
2.0000 | Freq: Every day | NASAL | Status: DC
Start: 2020-09-04 — End: 2020-09-06
  Administered 2020-09-05 – 2020-09-06 (×2): 2 via NASAL
  Filled 2020-09-04: qty 16

## 2020-09-04 SURGICAL SUPPLY — 35 items
ADHESIVE DERMABOND GLUE HI VISCOSITY (Supply) ×1
ADHESIVE SKIN CLOSURE 0.7ML DERMABOND ADVANCED (Supply) ×2 IMPLANT
APPLICATOR CHLORAPREP 26ML ORANGE LARGE (Solution) ×3 IMPLANT
COVER OR LIGHT DISP CAMERA (Supply) ×3 IMPLANT
COVER TABLE 60X90 REG DUTY (Drape) ×3 IMPLANT
DRAPE SUR W78XL121IN 5 LAYR THYRD SMS POLYPR DMND FEN ABSRB REINF DISP FOR ENT NEURO (Drape) ×1 IMPLANT
DRAPE SURG IOBAN 2 ANTIMIC MED 13 X 13IN (Drape) ×3 IMPLANT
DRAPE THYROID (Drape) ×2
DRAPE UTILITY W/TAPE 15X26 (Drape) ×6 IMPLANT
FILTER NEPTUNE 4PORT MANIFOLD (Supply) ×3 IMPLANT
GLOVE SURG BIOGEL SZ6 LTX STER PF (Glove) ×3 IMPLANT
GUIDEWIRE CHOICE PT 182CM J (Guidewire) ×2 IMPLANT
GUIDEWIRE ENROUTE 0.014"X95CM (Guidewire) ×1
GUIDEWIRE ENROUTE 0.014INX95CM (Guidewire) ×2 IMPLANT
INFLATOR ENCORE DEVICE 26 (Supply) ×3 IMPLANT
KIT ACCESS 4FR L7CM MICRO INTRODUCER TRANSCAROTID PERIPHERAL CATH INTRODUCTION STER (Supply) ×3 IMPLANT
MARKER SKIN WRITESITE PLUS (Supply) ×3 IMPLANT
PACK CUSTOM VASCULAR CUT DOWN PACK (Pack) ×3 IMPLANT
PACK TOWEL ORTHO LIGHT BLUE STER (Pack) ×3 IMPLANT
PAD EYE STERILE 1 5/8 X 2 5/8IN (Dressing) IMPLANT
SET MICROPUNCTURE STIFF SS 5FRX10X45CM (Sheath) ×3 IMPLANT
SHIELD EYE ~~LOC~~ 3 X 2.5IN CLEAR (Dressing) IMPLANT
SHIELD RADPAD PERIPH (Supply) ×6 IMPLANT
SOL SOD CHL IRRIG 1500ML BTL (Solution) ×6 IMPLANT
SPONGE SURGICEL ABS 4 X 8IN (Supply) ×2 IMPLANT
SPONGE SURGIFOAM GELATIN 100 (Sponge) ×3 IMPLANT
STENT TRANSCAROTID ENROUTE 9X40 (Stent (Non-Cardiac)) ×2 IMPLANT
SUTR MONCRYL 4-0 PS-2 UND 27IN (Suture) ×6 IMPLANT
SUTR PROLENE 5-0 RB-2 BL MONO (Suture) ×6 IMPLANT
SUTR SILK 2-0 SH 30 IN BLACK (Suture) ×6 IMPLANT
SUTR VICRYL ANTIB 3-0 SH 27 UNDY (Suture) ×3 IMPLANT
SYRINGE MEDALLION 10ML YELLOW (Syringe) ×6 IMPLANT
SYRINGE MEDALLION PURPLE 20ML (Syringe) ×9 IMPLANT
SYSTEM TRANSCAROTID NEUROPROTECT ENROUTE (Sheath) ×2 IMPLANT
VISE MULTI TORQUE .014-.038 IN (Supply) ×3 IMPLANT

## 2020-09-04 NOTE — Anesthesia Procedure Notes (Signed)
---------------------------------------------------------------------------------------------------------------------------------------    AIRWAY   GENERAL INFORMATION AND STAFF    Patient location during procedure: OR       Date of Procedure: 09/04/2020 7:44 AM  CONDITION PRIOR TO MANIPULATION     Current Airway/Neck Condition:  Normal        For more airway physical exam details, see Anesthesia PreOp Evaluation  AIRWAY METHOD     Patient Position:  Sniffing    Preoxygenated: yes      Maintained In-Line Stability: yes          To see details of medications used, see MAR    Induction: IV    Mask Difficulty Assessment:  2 - vent by mask + OPA/NPA      Mask NMB: 2 - vent by mask + OPA/NPA      Technique Used for Successful ETT Placement:  Video laryngoscopy    Devices/Methods Used in Placement:  Intubating stylet    Blade Type:  Macintosh    Laryngoscope Blade/Video laryngoscope Blade Size:  3    Cormack-Lehane Classification:  Grade I - full view of glottis    Placement Verified by: capnometry, auscultation and equal breath sounds      Number of Attempts at Approach:  1    Number of Other Approaches Attempted:  0  FINAL AIRWAY DETAILS    Final Airway Type:  Endotracheal airway    Final Endotracheal Airway:  ETT      Cuffed: cuffed    Insertion Site:  Oral    ETT Size (mm):  7.0    Distance inserted from Teeth (cm):  22  ----------------------------------------------------------------------------------------------------------------------------------------

## 2020-09-04 NOTE — Progress Notes (Addendum)
Vascular Post-op Note    Subjective:     [name] is a 75 y.o. female is s/p R TCAR    -Asymptomatically bradycardic   -Is taking PO, void, flatus   -Denies cp/sob/emesis/new numbness or feeling of weakness    Objective:      BP: (60-139)/(40-78)   Temp:  [35.9 C (96.6 F)-36.2 C (97.2 F)]   Temp src: Temporal (04/25 1200)  Heart Rate:  [45-86]   Resp:  [13-18]   SpO2:  [94 %-98 %]   Height:  [170 cm (5' 6.93")]   Weight:  [107 kg (235 lb 14.3 oz)]     Intake/Output  I/O this shift:  04/25 0700 - 04/25 1459  In: 2500 (23.4 mL/kg) [I.V.:2500]  Out: 360 (3.4 mL/kg) [Urine:350; Blood:10]  Net: 2140  Weight: 107 kg     CMP  No results for input(s): NA, K, CL, CO2, CREAT, GLUCOSE, ALT, AST, APHOS, TB, DB, AMY, LIP in the last 168 hours.    No components found with this basename: BUN, LABGLOM, CALCIUM,  LAC    Coags  No results for input(s): INR, PTT in the last 168 hours.    No components found with this basename: APTT    CBC  No results for input(s): WBC, HGB, HCT, PLT in the last 168 hours.    Physical Exam:      General appearance: active and no distress  HEENT: PERRLA, extra ocular movement intact and neck supple with midline trachea.   CV: regular rate and rhythm  Lungs:unlabored WOB  Abd: Soft, NT, ND  Extremities:  R/L femoral pulse palp  R/L DP multimpashic  R/L PT multipahsic  Motor intact BUE/BLE   Sensory intact BUE/BLE    Neuro: normal without focal findings, mental status, speech normal, alert and oriented x3, PERLA and cranial nerves 2-12 intact    Assessment:   [name] is a 75 y.o. 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    Plan:     E/F:  [place] prn  Diet: regular  Cardiac: statin, DAPT  Pulmonary: inhalers   Endo: repeat dexamethasone dose   GI: regular diet, PPI, bowel reg  Uro: foley out, voiding   Abx: periop   Anti-platelet: ASA/plasvix   DVT prophylaxis: SQH  Pain: multimodal    Overall plan for the day:  postop recovery    Please page the vascular surgery resident on call for any questions.    Juanna Cao, MD  Vascular Surgery  09/04/2020 at 12:31 PM

## 2020-09-04 NOTE — Preop H&P (Signed)
UPDATES TO PATIENT'S CONDITION on the DAY OF SURGERY/PROCEDURE    I. Updates to Patient's Condition (to be completed by a provider privileged to complete a H&P, following reassessment of the patient by the provider):    Day of Surgery/Procedure Update:  History  History reviewed and no change    Physical  Physical exam updated and no change            II. Procedure Readiness   I have reviewed the patient's H&P and updated condition. By completing and signing this form, I attest that this patient is ready for surgery/procedure.    III. Attestation   I have reviewed the updated information regarding the patient's condition and it is appropriate to proceed with the planned surgery/procedure.    I have reminded [name] that COVID-19 is still present in our community. She was advised that [facility] and its affiliates have made deliberate and widespread changes to policies and procedures, consistent with applicable directives, in order to reduce the risk of exposure in our facilities.     I further explained and [name] understands that given the communicability of the SARS CoV2 coronavirus, there remains a small but real risk of contracting the disease while receiving perioperative care - even with stringent preventive measures in place.   [name] understands the potential consequences of COVID-19 disease as it relates to their planned procedure and anticipated postoperative course.      The patient and I have considered and discussed the relative risks and benefits of proceeding with his/her surgery - both in terms of the procedure itself, and also in the context of the ongoing pandemic.  [name] wishes to proceed with the procedure.     Randolm Idol, MD as of 7:00 AM 09/04/2020

## 2020-09-04 NOTE — Anesthesia Case Conclusion (Signed)
CASE CONCLUSION  [place]  Actions:  Suctioned  Criteria Used for Airway Removal:  Adequate Tv & RR, acceptable O2 saturation and following commands  Assessment:  Routine  Transport  Directly to: PACU  Airway:  Nasal cannula  Oxygen Delivery:  2 lpm  Position:  Recumbent  Patient Condition on Handoff  Level of Consciousness:  Mildly sedated  Patient Condition:  Stable  Handoff Report to:  RN

## 2020-09-04 NOTE — Progress Notes (Signed)
Report Given To  Sharlet Salina, [place]       Descriptive Sentence / Reason for Admission   S/p R TCAR 4/25 for incidental findings of severe bilateral stenosis of the carotid arteries  Hx: Arthritis, asthma, CAD DVT, PE, COPD on CPAP    Verified by: Kathie Dike, RN        Active Issues / Relevant Events   Neuro: A&O x 3  Telemetry: SB  Tubes/Wires: N/A  Pulmonary: RA   GI/Nutrition: Reg diet.  Gas - N. Last BM PTA  Renal: spont  Endocrine: N/A  Mobility: bedrest  Continuous IV medications:   Pump/rate verified date/time:  Pain control: tyle ATC  IV access: PIV R/L hand  Wounds: R neck, L groin  Orders Released: Yes   Weight:  Tele Box #: 11  Pre-Op Unit:  Last COVID swab:  Miscellaneous:     Verified by: Kathie Dike, RN        To Do List  -CPAP at night  -Q4 neuros, mod NIH  -HOB > 30    Verified by: Kathie Dike, RN        Anticipatory Guidance / Discharge Planning  Expected Dischage Date: TBD  From: home  Plan: home  Transport: TBD    [name], daughter, [phone] updated 09/04/20 @ 1700    Verified by: Kathie Dike, RN

## 2020-09-04 NOTE — Anesthesia Postprocedure Evaluation (Addendum)
Anesthesia Post-Op Note    Patient: [name]    Procedure(s) Performed:  Procedure Summary  Date:  09/04/2020 Anesthesia Start: 09/04/2020  7:29 AM Anesthesia Stop: 09/04/2020  9:39 AM Room / Location:  [id number]_OR_[id number] / [facility] MAIN OR   Procedure(s):  CAROTID ANGIOGRAM(TCAR) Diagnosis:  Carotid artery stenosis [I65.29] Surgeon(s):  Randolm Idol, MD  Pappas, Alferd Patee, MD Responsible Anesthesia Provider:  Nira Retort, MD         Recovery Vitals  BP: 101/55 (09/04/2020 12:00 PM)  Arterial Line BP: 112/46 (09/04/2020 12:15 PM)  Heart Rate: (!) 45 (09/04/2020 12:15 PM)  Heart Rate (via Pulse Ox): (!) 46 (09/04/2020 12:15 PM)  Resp: 14 (09/04/2020 12:15 PM)  Temp: 36.2 C (97.2 F) (09/04/2020 12:00 PM)  SpO2: 97 % (09/04/2020 12:15 PM)  O2 Flow Rate: 2 L/min (09/04/2020 12:15 PM)   0-10 Scale: 5 (09/04/2020 11:00 AM)    Anesthesia type:  general  Complications Noted During Procedure or in PACU:  None   Comment:    Patient Location:  PACU  Level of Consciousness:    Recovered to baseline and awake  Patient Participation:     Able to participate  Temperature Status:    Normothermic  Oxygen Saturation:    Within patient's normal range  Cardiac Status:   Stable (Tends toward low BP but responsive to fluids.)  Fluid Status:    Stable  Airway Patency:     Yes  Pulmonary Status:    Baseline  Pain Management:    Adequate analgesia  Nausea and Vomiting:  None    Post Op Assessment:    Tolerated procedure well  Responsible Anesthesia Provider Attestation:  All indicated post anesthesia care provided  Comments:    Ready for Phase II recovery.       Complications Noted During Recovery Period:      None  Recovery from Anesthesia:      Recovered to baseline level of consciousness  Condition of patient:      Recovered to pre-anesthetic condition

## 2020-09-04 NOTE — Anesthesia Procedure Notes (Addendum)
----------------------------------------------------------------------------------------------------------------------------------------    ARTERIAL LINE     Date of Procedure: 09/04/2020 7:34 AM  PLACEMENT     Final Insertion site (or Primary Attempted Site if not successful): right radial    Catheter Length: 20G 4.45cm  Attempts (Skin Punctures):  1  INDICATIONS     Indications: Hemodynamic Monitoring, Multiple ABG's and Prolonged Procedure    CONSENT AND TIMEOUT     Consent:  Obtained per policy    Timeout: patient identified (name/DOB) , allergies reviewed with patient/record , operative procedure/side/site verified against surgical schedule and anticoagulation/antiplatelet status reviewed  PROCEDURE DETAILS       Patient Location: OR      Sedation used: yes      Monitoring: full ASA monitors      Sterile Technique: hand sanitizer, mask, hat and sterile gloves      Site Preparation:  Chlorhexidine+isopropanol, sterile drape, local infiltration and arm board      Technique:  Arrow catheter and direct cannulation      Placement confirmed by:  Transducer and pulsatile flow      Secured by:  Tape, transparent dressing and biopatch  POST-PROCEDURE DETAILS     Complications:  None, well-tolerated    Patient condition at end of procedure:  Stable  STAFF     Performed by Pearla Dubonnet, MD      Attest: I was present for the entire procedure   Nira Retort, MD  ----------------------------------------------------------------------------------------------------------------------------------------

## 2020-09-04 NOTE — Progress Notes (Signed)
Patient transferred from PACU to [id number] at [place]. Patient currently AOx 3, VSS, and is monitored on telemetry. Skin assessment was completed and observed by Pryor Curia and Augustina Mood, RN.     Marland KitchenKathie Dike, RN

## 2020-09-04 NOTE — Op Note (Signed)
[name], [name] MR #:  [id number]   CSN:  [id number] DOB:  [birth year]    AGE:  75     SURGEON:  Randolm Idol, MD  CO-SURGEON:    ASSISTANT:  Levora Dredge, MD  SURGERY DATE:  09/04/2020    PREOPERATIVE DIAGNOSIS:  Bilateral critical carotid artery stenosis.    POSTOPERATIVE DIAGNOSIS:  Bilateral critical carotid artery stenosis.    OPERATIVE PROCEDURE:  Right trans carotid artery stent placement with flow reversal and ultrasound-guided access of the left common femoral vein.    ANESTHESIA:  General.    INDICATIONS:  [name] is a 75 year old woman who was identified to have bilateral critical carotid artery stenosis that was asymptomatic.  Given her severe underlying pulmonary disease, it was decided TCAR would be in her best interest.  Patient and her family were informed of the potential risks, complications, benefits, and alternatives to the procedure including, but not limited to, bleeding, infection, and need for further procedure.  Patient acknowledged understanding, and informed consent was obtained.    DESCRIPTION OF PROCEDURE:  Patient was transported to the operating room, placed on the operating table in a supine position.  All bony prominences were padded.  Bilateral groins and the right neck were prepped and draped in usual sterile fashion with a chlorhexidine solution.  Two patient identifier timeout was performed, and preoperative antibiotics were administered.       Transverse incision was made over the clavicle.  This was carried down through the skin and subcutaneous tissues.  Platysma was transected.  Rafi of the sternocleidomastoid was identified and bluntly dissected.  The internal jugular vein was retracted out of the way, and the common carotid artery was identified.  This was encircled with a vessel loop.  Vagus nerve was not seen.  The patient was systemically anticoagulated with 8000 units of heparin and once confirmed a therapeutic ACT, a 5-0 Prolene was used  to perform a pursestring suture in the common carotid artery.  Micropuncture needle was used to access through the pursestring, and a microwire was advanced.  We were happy with this position.  This was exchanged out for a micro sheath.  Carotid angiogram was performed.  In an AP view, we had a great picture of the bifurcation.  TCAR sheath was then placed. The left common femoral vein was directly accessed with a micro needle under direct ultrasound guidance, and a wire was advanced without difficulty.  This was exchanged out for a micro sheath, Bentson wire was placed, and the device sheath was then placed.  Passive flow reversal was then hooked up.  We performed a TCAR time-out confirming a therapeutic ACT.  Patient had been premedicated with glycopyrrolate.  5.5 x 30 mm balloon was then advanced and angioplastied after the clamp was placed.  9 x 40 stent was then advanced and deployed across the lesion into the internal carotid artery at the distal end in the common carotid artery.  We waited 2 minutes, and then we performed an angiogram.  This showed excellent resolution of the stenosis.  We performed an oblique view and confirmed.  We were happy with this result, wire was removed, and clamp was removed after the flow reversal was disconnected.  Patient was reversed with protamine.  The sheath was removed from the common carotid artery, and pursestring was used to cinch down.  The venous access sheath was removed, and manual pressure was held for hemostasis.  Hemostasis was achieved in the  neck incision, and the platysma was closed with 2-0 Vicryl and the skin with Monocryl and Dermabond.  Patient tolerated procedure well.  She was emerged from anesthesia, following commands, moving all 4 extremities.  All instrument counts were correct at the end of the case.  I, Tsosie Billing, was present for the entirety of the procedure.    FLOW REVERSAL TOTAL TIME:  15 minutes.    CONTRAST USED:  30 mL.    TOTAL  FLUORSCOPY TIME:  7.2 minutes.             ______________________________  Randolm Idol, MD    JLE/MODL  DD:  09/04/2020 10:02:13  DT:  09/04/2020 10:51:15  Job #:  [id number]    cc:

## 2020-09-04 NOTE — Progress Notes (Signed)
09/04/20 1218   [facility] Patient Class Review   Patient Class Review Inpatient     Patient class effective as of 09/04/20.    Cherylynn Ridges, RN  Utilization Management  [phone]

## 2020-09-04 NOTE — INTERIM OP NOTE (Signed)
1. Procedure: Right TCAR  2. Planned return to operating room: none  3. Antibiotic regimen: periop  4. Pain Management: multimodal  5. DVT prophylaxis and duration: resume home DAPT and statin  6. Weight bearing status and activity restrictions: free to elevate HOB   7. Diet: regular   8. Additional Specifics (as needed): BP goals 90-140    Interim Op Note (Surgical Log ID: [id number])       Date of Surgery: 09/04/2020       Surgeons: Surgeon(s) and Role:     Randolm Idol, MD - Primary     * Pappas, Alferd Patee, MD - Fellow   Assistants:         Pre-op Diagnosis: Pre-Op Diagnosis Codes:     * Carotid artery stenosis [I65.29]       Post-op Diagnosis: Post-Op Diagnosis Codes:     * Carotid artery stenosis [I65.29]       Procedure(s) Performed: Procedure(s) (LRB):  CAROTID ANGIOGRAM(TCAR) (Right)       Anesthesia Type: General        Fluid Totals: I/O this shift:  04/25 0700 - 04/25 1459  In: 1100 (10.3 mL/kg) [I.V.:1100]  Out: 10 (0.1 mL/kg) [Blood:10]  Net: 1090  Weight: 107 kg        Estimated Blood Loss: 10 mL       Specimens to Pathology:  * No specimens in log *       Temporary Implants:        Packing:                 Patient Condition: good       Findings (Including unexpected complications): See op note and intraop imaging.      Signed:  Juanna Cao, MD  on 09/04/2020 at 9:36 AM

## 2020-09-04 NOTE — INTERIM OP NOTE (Addendum)
Interim Op Note (Surgical Log ID: [id number])       Date of Surgery: 09/04/2020       Surgeons: Surgeon(s) and Role:     Randolm Idol, MD - Primary     * Nakul Avino, Alferd Patee, MD - Fellow   Assistants:         Pre-op Diagnosis: Pre-Op Diagnosis Codes:     * Carotid artery stenosis [I65.29]       Post-op Diagnosis: Post-Op Diagnosis Codes:     * Carotid artery stenosis [I65.29]       Procedure(s) Performed: Procedure(s) (LRB):  CAROTID ANGIOGRAM(TCAR) (Right)       Anesthesia Type: General        Fluid Totals: I/O this shift:  04/25 0700 - 04/25 1459  In: 2500 (23.4 mL/kg) [I.V.:2500]  Out: 10 (0.1 mL/kg) [Blood:10]  Net: 2490  Weight: 107 kg        Estimated Blood Loss: 10 mL       Specimens to Pathology:  * No specimens in log *       Temporary Implants:        Packing:                 Patient Condition: good       Findings (Including unexpected complications): R TCAR for asym critical stenosis, 9 x40 stent, 5.5 x30 balloon  At case conclusion, neurointact      Signed:  Ardelle Balls, MD  on 09/04/2020 at 9:39 AM     Vascular Quality Initiative -Carotid Artery Stent    Does this carotid artery procedure meet VQI Carotid Stent inclusion requirements?     Mark YES below if this stent involves the carotid bifurcation or is isolated to the internal, external,or common carotid artery that may be performed by percutaneous or open (cut down) approach. Both primary and redo stenting is included. Unlike other procedures, stenting for trauma is also included.Typically, the carotid bifurcation or internal carotid artery is treated, but sometimes an isolated common carotid stenosis is treated alone, or in combination with a bifurcation/internal carotid stent. Rarely the  external carotid is treated but we are including it to meet CAS recertification requirements. The data entry form allows any of the four locations to be recorded for compliance with CMS Carotid Stent reporting. If the procedure failed when  attempting to place the long sheath in the CCA, this should be recorded as a technical failure on the procedure tab. If bilateral Carotid Artery Stent procedures are done on the same  day, please enter as two separate procedures.    Mark NO below if stenting was placed in theintracranial carotid artery (above C1) or the stent was placed for bleeding or infection.    Yes       Urgency: Elective  Site: OR, fixed    Indication: Asymptomatic stenosis    Anesthesia: General     Side: right    Lesion Type: Atherosclerosis Lesion Length: 63mm    ICA distal tortuosity: None/Mild (no S curve or minor with bends <=75 degrees)    High Anatomic Risk for CEA: Cervical immobility    Refused Surgery: no    Lesion Calcification: 51-99% circumference    Arch Atherosclerosis: Mild   Arch Type: Type I   Bovine Arch: no    Medication Loading: None  Anti-platelet IIb/IIIa inhibitor treatment: no    Stenosis by Angiography 90% 2nd Stenosis: no  2nd Stenosis Severity (if applicable): n/a%  Lesion Location: Bifurcation  Upper Extent of Lesion (Location): C3  Number of Stents: 1    Approach: open carotid  Prophylactic Anti-bradyarrhythmic yes    Heparin  yes  Protamine:  yes       Contrast Volume: 30 ml   Fluoroscopy Time:  7.2  Implants:   Implant Name Type Inv. Item Serial No. Manufacturer Mfr No. Lot No. LRB No. Used Model No. Status   STENT TRANSCAROTID ENROUTE [id number] - [id number] Stent (Non-Cardiac) Omelia Blackwater  [employer] [id number] Right 1 [id number] Implanted       Bradyarrhythmia requiring Tx: no      Protection Device Used: yes, successful  Protection Device Type: Flow reversal   Flow Reversal Time: mintues:15  Protection Device Failure:  no    Neurologic Change:  no.   Intracranial Completion Angiogram: No    Lesion(s) treated: Bifurcation or ICA     Bifurcation or ICA    Pre-dilate Before Protection Device:  no     Protection Device: Retrograde Flow    Technical Failure:  no     If Technical  Failure is No:    Pre-dilate Before Stent:  yes    Stent Type: Other silkroad    Number of Stents: 1 Post Dilate: no      Balloon Diameter: 5.5 mm (largest size used during procedure)      Current Facility-Administered Medications   Medication Dose Route Frequency    acetaminophen (TYLENOL) tablet 1,000 mg  1,000 mg Oral TID    aspirin EC tablet 81 mg  81 mg Oral Daily    atorvastatin (LIPITOR) tablet 80 mg  80 mg Oral Daily    [START ON 09/05/2020] clopidogrel (PLAVIX) tablet 75 mg  75 mg Oral Daily    docusate sodium (COLACE) capsule 100 mg  100 mg Oral BID    fluticasone (FLONASE) 50 MCG/ACT nasal spray 2 spray  2 spray Nasal Daily    gabapentin (NEURONTIN) capsule 300 mg  300 mg Oral TID    ipratropium-albuterol (DUONEB) 0.5-2.5mg  /59mL nebulization solution 3 mL  3 mL Nebulization Q6H    montelukast (SINGULAIR) tablet 10 mg  10 mg Oral Nightly    [START ON 09/05/2020] pantoprazole (PROTONIX) EC tablet 40 mg  40 mg Oral QAM    [START ON 09/05/2020] sertraline (ZOLOFT) tablet 100 mg  100 mg Oral Daily    heparin (porcine) SQ injection 5,000 units  5,000 units Subcutaneous TID    ondansetron (ZOFRAN) injection 4 mg  4 mg Intravenous Q6H PRN    dexAMETHasone (DECADRON) injection 8 mg  8 mg Intravenous Q12H    labetalol (NORMODYNE,TRANDATE) 20 mg/4 mL injection 20 mg  20 mg Intravenous Q4H PRN    arformoterol (BROVANA) 15 mcg/19mL nebulizer solution 15 mcg  15 mcg Nebulization 2 times per day    And    budesonide (PULMICORT) nebulizer solution 0.5 mg  0.5 mg Nebulization BID    And    revefenacin (YUPELRI) 175 mcg/38mL nebulizer solution 175 mcg  175 mcg Nebulization Daily    oxyCODONE (ROXICODONE) IR tablet 5 mg  5 mg Oral Q4H PRN    Or    oxyCODONE (ROXICODONE) IR tablet 10 mg  10 mg Oral Q4H PRN

## 2020-09-04 NOTE — Addendum Note (Signed)
Addendum  created 09/04/20 1504 by Nira Retort, MD    Clinical Note Signed

## 2020-09-05 ENCOUNTER — Telehealth: Payer: Self-pay

## 2020-09-05 DIAGNOSIS — I6529 Occlusion and stenosis of unspecified carotid artery: Secondary | ICD-10-CM

## 2020-09-05 DIAGNOSIS — I2510 Atherosclerotic heart disease of native coronary artery without angina pectoris: Secondary | ICD-10-CM

## 2020-09-05 DIAGNOSIS — I119 Hypertensive heart disease without heart failure: Secondary | ICD-10-CM

## 2020-09-05 DIAGNOSIS — I350 Nonrheumatic aortic (valve) stenosis: Secondary | ICD-10-CM

## 2020-09-05 DIAGNOSIS — R001 Bradycardia, unspecified: Secondary | ICD-10-CM

## 2020-09-05 NOTE — Comprehensive Assessment (Signed)
Adult Social Work Initial Assessment    SW met with pt to obtain her baseline information. She resides alone in a 2nd floor apartment in [place] ([place]). There is an elevator to access her apartment. She ambulates independently without the need for an assistive device. She has a rolling walker at home. She has aide services every T/TH 5 hours each day, covered by [employer]. She is not on home O2, but wears a CPAP at night. She gets her supplies through [place]. She is not currently receiving home care services, but is known to [place]. She drives and is retired from the [employer] in [place]. Pts PCP is Dr. Laurance Flatten. Her Cardiologist is Dr. Kerin Ransom. She uses [facility] on [address] in [place]. She stated she is compliant with taking her medications and denied issues affording her medications. She is independent with medication management using a pill box. She has a scale at home. She reported she let her son borrow her BP cuff and will ask for him to return it to her.     Writer's contact with patient was via face to face PPE Used: mask, gloves, and eye protection.   Was Patient masked during visit: No.    Demographics:  Religious Beliefs: [place] of Residence: [place] ([place])  Marital status: Widowed  Ethnicity/Race: Caucasian  Primary Language: English  Primary Care Taker of?: No one    Risk Factors:  Risk Factors: Age related issues, Adjustment to Dx/Injury/Illness    Advance Directive:  *Has patient (or family) completed any of the following? (select all that apply): Health Care Proxy (HCP)  HCP Name: Merril Abbe (Daughter)  HCP Phone Number: [phone]  HCP available for inclusion in the chart?: Yes  HCP in chart: Yes  *Would they like to discuss any issues related to MOLST, DNR Order, HCP, Living Will, or POA?: No  *Health Care Directive teaching done: No    Personal Contacts/Support System:  Spokesperson Name: Sharlyne Koeneman  Relationship to  Patient : Daughter  Phone Number(s): [phone]  Alternate Spokesperson?: No  Emergency Contact other than spokesperson?: No  Additional Contacts/Supports: Yes    Living Situation:  Lives With: Alone  Can they assist patient after discharge?: No  Primary Care Taker of?: No one    Home Geography:  Type of Home: Apartment  # Of Steps In Home: 0  # Steps to Enter Home: 0 Management consultant)  Bedroom: Second floor  Bathroom: Second floor - full  Utilitites Working: Yes    Psychosocial:  Person assessed: Patient  Coping Status: Appropriate to Stressor  Current Goal of Care: Curative  Anticipatory Grief / Bereavement Risk: Normal risk    Alcohol Assessment:  > 0 ETOH drinks in past month: No    Substance Abuse (Not including alcohol):  Other Substance Abuse: No  No    Baseline ADL functioning:  Transfers: Independent  Ambulation: Independent  Assistive Device: none  Bathing/Grooming: With assistance  Meal Prep: Independent [employer] Meals covered by [employer]. Every Tuesday, 7 meals.)  Able to feed self?: Yes  Household maintenance/chores: With assistance  Able to drive?: Yes    Dialysis:  Does Patient have Dialysis?: No    Income Information:  Vocational: Retired  Income Situation: [place]: Leisure centre manager Choice  Prescription Coverage: and has  Pharmacy Used: [facility] [address] in [place] in [place] military: No  Is patient OPWDD connected? : No  Is the patient presumed eligible for OPWDD services?: No    Home Care Services:  Do you currently have home care services?: No  Current home equipment available: [facility]:  Do you have home oxygen?: Yes  Vendor and equipment: CPAP  No home oxygen (Comment): [facility]     Suhayb Anzalone C. Norberto Sorenson, [facility] SW  [phone]  [place]

## 2020-09-05 NOTE — Progress Notes (Signed)
Report Given To  Veva Holes, RN [phone]       Descriptive Sentence / Reason for Admission   S/p R TCAR 4/25 for incidental findings of severe bilateral stenosis of the carotid arteries  Hx: Arthritis, asthma, CAD DVT, PE, CABG, COPD on CPAP    Verified by: Denice Bors, RN        Active Issues / Relevant Events   Neuro: A&O x 3  Telemetry: SB  Tubes/Wires: N/A  Pulmonary: RA   GI/Nutrition: Reg diet.  Gas - N. Last BM PTA  Renal: spont  Endocrine: N/A  Mobility: activity as tolerated  Continuous IV medications: n/a  Pump/rate verified date/time:  Pain control: tyle ATC  IV access: PIV R/L hand  Wounds: R neck, L groin  Orders Released: Yes   Weight:  Tele Box #: 11  Pre-Op Unit:  Last COVID swab:  Miscellaneous:   4/26- Patient bradycardic 32-40s overnight, and at times hypotensive, PRN Atropine ordered for a sustain HR less than 35, notify MD first before pushing.    Verified by: Denice Bors, RN        To Do List  -CPAP at night  -Q4 neuros, mod NIH  -HOB > 30  -Keep SBP in the 90-140s  -Duo neb treatments    Verified by: Denice Bors, RN        Anticipatory Guidance / Discharge Planning  Expected [place] Date: TBD  From: home  Plan: home  Transport: TBD    [name], daughter, [phone] updated 09/04/20 @ 1700    Verified by: Denice Bors, RN

## 2020-09-05 NOTE — Anesthesia Postprocedure Evaluation (Signed)
Anesthesia Post-Op Note    Patient: [name]    Procedure(s) Performed:  Procedure Summary  Date:  09/04/2020 Anesthesia Start: 09/04/2020  7:29 AM Anesthesia Stop: 09/04/2020  9:39 AM Room / Location:  S_OR_10 / [place] MAIN OR   Procedure(s):  CAROTID ANGIOGRAM(TCAR) Diagnosis:  Carotid artery stenosis [I65.29] Surgeon(s):  Randolm Idol, MD  Pappas, Alferd Patee, MD Responsible Anesthesia Provider:  Nira Retort, MD         Recovery Vitals  BP: 96/50 (09/05/2020  5:00 AM)  Arterial Line BP: (!) 102/37 (09/04/2020  5:15 PM)  Heart Rate: (!) 45 (09/05/2020  5:00 AM)  Heart Rate (via Pulse Ox): (!) 49 (09/05/2020  5:00 AM)  Resp: 16 (09/05/2020  5:00 AM)  Temp: 36.6 C (97.9 F) (09/05/2020  5:00 AM)  SpO2: 93 % (09/05/2020  5:00 AM)  O2 Flow Rate: 2 L/min (09/04/2020  6:48 PM)   0-10 Scale: 0 (09/05/2020  5:00 AM)    Anesthesia type:  general  Complications Noted During Procedure or in PACU:  None   Comment:    ""     Complications Noted During Recovery Period:      None  Recovery from Anesthesia:      Recovered to baseline level of consciousness  Condition of patient:      Satisfactory

## 2020-09-05 NOTE — Telephone Encounter (Signed)
Ok to give verbal order, yes

## 2020-09-05 NOTE — Progress Notes (Addendum)
Covering for Migdalia Dk, [facility]     Writer's contact with patient was via face-to-face PPE used: mask, gloves, and eye protection.   Was patient masked during visit: No.        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    She manages own medications, is able to get transport home when discharged  Widowed (January 2021), supportive daughters     4/27:  [facility] accepted home care referral     Irene Shipper, RN  Acute Care Coordinator [phone]  Phone:  [phone],  Fax:  [phone]  Weekends or holidays, please call weekend care coordinator at [phone] and enter pager number [phone]

## 2020-09-05 NOTE — H&P (Addendum)
Cardiology Consult Service - Consult Note    Consulting service: Vascular Surgery  Consult question: post-op bradycardia  Primary cardiologist: Dr. Trilby Leaver [facility])    HPI     [name] is a 75 y.o. 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    Her bradycardia began yesterday in the PACU post-op, she does not remember this happening to her in the past and states that her HR is normally in the 70's. She endorses lightheadedness when sitting up and when laying down, but only with a change in position, not while resting and this is a symptom that has been ongoing and has been followed by her Cardiologist. She endorses chest pressure but not chest pain that started last night. She states that it feels like a "chest cold" and points to the mid-sternum for where the pain is felt the most. She says it does not worsen with deep breathing or coughing. She denies radiation of the pain.     She does also endorse pain at the surgical site. She says she has been short of breath but not out of the ordinary for her given her history of COPD.     She most recently had a mechanical fall in January which led to significant injuries for which she was hospitalized she denies any prodromal symptoms before the fall and states it was purely accidental.     Of note, she was last seen by Cardiology Jan/Feb for dyspnea on exertion and lightheadedness where she was noted to be back on Plavix and not Eliquis. She also had a stress test following that visit.     06/12/2020 Stress test Conclusions  - Negative Lexiscan nuclear stress test for ischemia or infarct.  - Normal left ventricular size and systolic function, EF [id number].  - No ischemic ECG changes with stress.  - No chest pain during the test.  - Brief run of 2:1 AV  block with Lexiscan.     Cardiac meds:  Aspirin 81 mg   Atorvastatin 80 mg   Plavix 75 mg      Review of systems     A complete 12 point review of systems was performed which was negative except for what was documented in the HPI above.    PMH / [place]     Past Medical History:   Diagnosis Date    Allergic rhinitis 09/30/2014    Aortic stenosis 04/14/2017    Moderate     Arthritis     Asthma     CAD (coronary artery disease)     Carotid stenosis, bilateral 10/31/2019    6/21- incidental finding during hospitalization, >70% 9/21- Severe, surgery delayed due to odontoid fracture winter 2021 1/22- severe, recommending   cardiac  clearance and then CEA vs TCAR    Chronic obstructive pulmonary disease 06/30/2013    PFTs 2/19: FEV1/FVC 0.65, FEV1 74% 3/21:Fev1/fvc 0.65, FEV1 64%  FEV1 has decreased by 16% and FVC has decreased by 10% . Transitioned to trelegy  12/21- Fev1/FVC 67 ; FEV1 73% The values have increased compared to previous testing in March 2021. The FEV1 and FVC increased by 14%  and 10% respectively.    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     Current Outpatient Medications on File Prior to Encounter   Medication Sig  Dispense Refill    atorvastatin (LIPITOR) 80 mg tablet Take 1 tablet (80 mg total) by mouth daily  Replacing 40mg  dose; please cancel refills 90 tablet 3    fluticasone-umeclidinium-vilanterol (TRELEGY ELLIPTA) 200-62.5-25 MCG/INH diskus inhaler Inhale 1 puff into the lungs daily 1 each 5    gabapentin (GABAPENTIN) 300 mg capsule Take 1 capsule (300 mg total) by mouth 3 times daily 180 capsule 2    clopidogrel (PLAVIX) 75 mg tablet Take 75 mg by mouth daily        sertraline (ZOLOFT) 100 mg tablet Take 1 tablet (100 mg total) by mouth daily  Replacing 50mg  dose, please cancel other refills 90 tablet 3    montelukast (SINGULAIR) 10 mg tablet Take 1 tablet (10 mg total) by mouth nightly 90 tablet 3    docusate sodium (COLACE) 100 MG capsule Take 100 mg by mouth 2 times daily      pantoprazole (PROTONIX) 40 MG EC tablet Take 1 tablet (40 mg total) by mouth daily (before breakfast)  Swallow whole. Do not crush, break, or chew. 90 each 3    acetaminophen (TYLENOL) 500 mg tablet Take 2 tablets (1,000 mg total) by mouth 3 times daily (Patient taking differently: Take 1,000 mg by mouth as needed  ) 30 tablet     albuterol HFA (PROVENTIL, VENTOLIN, PROAIR HFA) 108 (90 Base) MCG/ACT inhaler Inhale 2 puffs into the lungs every 4-6 hours as needed 1 each 6    azelastine (ASTELIN) 0.1 % nasal spray 1-2 sprays by Nasal route 2 times daily 90 mL 3    fluticasone (FLONASE) 50 MCG/ACT nasal spray 2 sprays by Nasal route daily 48 g 1    Cholecalciferol (VITAMIN D3) 5000 UNITS TABS Take 5,000 Units by mouth every morning         polyethylene glycol (GLYCOLAX,MIRALAX) 17 g powder packet Take by mouth daily as needed      ibuprofen (ADVIL,MOTRIN) 600 MG tablet Take 1 tablet (600 mg total) by mouth 3 times daily as needed for Pain      ARTIFICIAL TEARS (LUBRIFRESH PM,GENTEAL PM) ointment Please administer every 6 hours to both eyes (Patient not taking: Reported on 08/25/2020) 3.5 g     Non-System Medication  Medication/Supply: R thumb spica brace  Directions for Use: wear nightly 1 each 0    ipratropium-albuterol (DUONEB) 0.5-2.5mg  /53mL nebulizer solution Take 3 mLs by nebulization every  6 hours 90 mL 1    Misc. Devices (CPAP) machine Download and compliance report 1 each 0       Social history      reports that she has never smoked. She has never used smokeless tobacco. She reports previous alcohol use. She reports that she does not use drugs. No history on file for sexual activity.    Family history     Family History   Problem Relation Age of Onset    Other Mother         "rare lung disease"    Aneurysm Mother         abdominal aortic aneurysm    Diabetes Type II Mother     Breast cancer Mother     Blood clots Mother     Stroke Father     Stroke Brother         x2    Thyroid cancer Daughter     Anesthesia problems Neg Hx        Physical exam / Lab and imaging data     Vitals:    09/05/20 0754   BP: (!) 91/46   Pulse: 51   Resp: 16   Temp: 36.1 C (97 F)   Weight:    Height:      General: Well developed female, NAD  HEENT: Anicteric, MMM, OP clear, no adenopathy  Pulmonary: CTAB, no wheezes, rales, or rhonchi.  Normal work of breathing  Cardiac: RRR, normal S1, soft S2.  No S3 or S4.  3/6 systolic ejection murmur best heard at RUSB.  No JVD  Abdomen: Soft, NT, ND, NABS  Extremities: Warm, well perfused.  No edema, no rashes. PPP  Neurologic: Grossly normal    Labs personally reviewed and notable for:  No notable labs    ECG:  No recent to review    Telemetry:  Sinus bradycardia, occasionally dips to lower rate of 32-33 while sleeping, occasional sinus pause but not prolonged     Echo:  01/04/20  1.  Concentric LVH and left atrial enlargement  2.  Normal LVEF without regional wall motion abnormalities.   3.  Moderate to severe aortic valve stenosis   4.  Mitral valve sclerosis without significant stenosis  5.  Mild right ventricular enlargement/dysfunction with estimated mildly elevated pulmonary artery  systolic pressure   6.  Mildly dilated ascending aorta      Impression / Recommendations     Ms. Mariscal is a 75 y.o. 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    There is no evidence of AV block or other bradyarrhythmias on telemetry therefore this is likely sinus bradycardia. It seems her symptom of lightheadedness is long standing and related to positional changes, she denies syncope/presyncope symptoms. It is most likely that the sinus bradycardia is secondary to increased vagal tone following the surgery.     Given that she lives alone, it is warranted to observe her overnight in the hospital. If discharge today is desired, discharge with an Epatch monitor for 48 hours. If kept overnight and her bradycardia resolves, no need to discharge with an E patch. This decision of discharge will be left to the primary team.     Of note, she has a significant murmur for aortic stenosis, this is  known by her primary cardiologist Dr. Kerin Ransom and is being followed.        The above recommendations were discussed with the cardiology consult attending.    Thank you for the opportunity to participate in the care of your patient.  Please do not hesitate to contact the cardiology consult fellow on-call with any further questions or concerns.    Sincerely,    Teton Student, Cardiovascular Disease  [facility]       Gordan Payment, MD  12:25 PM  09/05/20        Cardiology attending note:  I have personally seen and examined this patient, and I have reviewed relevant notes and data. I have reviewed the above note and have made changes to the note as appropriate regarding the data, findings, impressions and / or plan of care as documented. Please see our detailed recommendations above.   Billie Ruddy,  MD

## 2020-09-05 NOTE — Addendum Note (Signed)
Addendum  created 09/05/20 9741 by Pearla Dubonnet, MD    Clinical Note Signed

## 2020-09-05 NOTE — Progress Notes (Signed)
Report Given To  Sharlet Salina, [place]       Descriptive Sentence / Reason for Admission   S/p R TCAR 4/25 for incidental findings of severe bilateral stenosis of the carotid arteries  Hx: Arthritis, asthma, CAD DVT, PE, CABG, COPD on CPAP    Verified by: Kathie Dike, RN      Active Issues / Relevant Events   Neuro: A&O x 3  Telemetry: SB  Tubes/Wires: N/A  Pulmonary: RA   GI/Nutrition: Reg diet. Gas - Y. Last BM PTA  Renal: spont  Endocrine: N/A  Mobility: activity as tolerated  Continuous IV medications: n/a  Pump/rate verified date/time:  Pain control: tyl ATC  IV access: PIV R hand  Wounds: R neck, L groin  Orders Released: Yes   Weight:  Tele Box #: [id number]  Pre-Op Unit:  Last COVID swab:  Miscellaneous:   4/26- Patient bradycardic 32-40s overnight, and at times hypotensive, PRN Atropine ordered for a sustain HR less than 35, notify MD first before pushing.    Verified by: Kathie Dike, RN      To Do List  -CPAP at night  -Q4 neuros, mod NIH  -Keep SBP in the 90-140s  -Duo neb treatments    Verified by: Kathie Dike, RN      Anticipatory Guidance / Discharge Planning  Expected [place] Date: TBD  From: home  Plan: home  Transport: TBD    [name], daughter, [phone] updated 09/04/20 @ 1700    Verified by: Kathie Dike, RN

## 2020-09-05 NOTE — Progress Notes (Signed)
Vascular Surgery Progress Note    Subjective:     Shirla Hodgkiss is a 75 y.o. female is s/p R TCAR    -Asymptomatically bradycardic   -Is taking PO, void, flatus   -Denies cp/sob/emesis/new numbness or feeling of weakness    Objective:      BP: (60-127)/(40-86)   Temp:  [35.9 C (96.6 F)-37 C (98.6 F)]   Temp src: Oral (04/26 0500)  Heart Rate:  [40-74]   Resp:  [12-18]   SpO2:  [90 %-97 %]   Weight:  [109.5 kg (241 lb 8 oz)]     Intake/Output  No intake/output data recorded.    CMP  No results for input(s): NA, K, CL, CO2, CREAT, GLUCOSE, ALT, AST, APHOS, TB, DB, AMY, LIP in the last 168 hours.    No components found with this basename: BUN, LABGLOM, CALCIUM,  LAC    Coags  No results for input(s): INR, PTT in the last 168 hours.    No components found with this basename: APTT    CBC  No results for input(s): WBC, HGB, HCT, PLT in the last 168 hours.    Physical Exam:      General appearance: active and no distress  HEENT: PERRLA, extra ocular movement intact and neck supple with midline trachea. Mild droop at right face with smile. Tongue midline  CV: regular rate and rhythm  Lungs:unlabored WOB  Abd: Soft, NT, ND  Extremities:  R/L femoral pulse palp  R/L DP multimpashic  R/L PT multipahsic  Motor intact BUE/BLE   Sensory intact BUE/BLE    Neuro: normal without focal findings, mental status, speech normal, alert and oriented x3, PERLA and cranial nerves 2-12 intact    Assessment:   Anae Hams is a 75 y.o. 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    Plan:     E/F:  Rep[place] prn  Diet: regular  Cardiac: statin, DAPT, potential cardiology consult   Pulmonary: inhalers   Endo: repeat dexamethasone dose   GI: regular diet, PPI, bowel reg  Uro: foley out, voiding   Abx: periop   Anti-platelet: ASA/plasvix   DVT prophylaxis: SQH  Pain: multimodal    Overall plan for the day: postop recovery, potential discharge today vs cards consult      Please page the vascular surgery resident on call for any questions.    Juanna Cao, MD  Vascular Surgery  09/05/2020 at 7:02 AM

## 2020-09-05 NOTE — Progress Notes (Signed)
Patient HR 36-40  bpm MD Rolly Salter made aware Patient asleep with cpap mask on, BP 127/56. Will continue to monitor patient.

## 2020-09-05 NOTE — Telephone Encounter (Signed)
Collie Siad from [facility] called and states that the patient will need home care services because she just had surgery. She states that all they need is a verbal confirmation from Dr. Laurance Flatten. Collie Siad said that you can reach her at [phone]

## 2020-09-05 NOTE — Progress Notes (Signed)
Home Health Assessment    Completed by: Clemens Catholic, RN  Phone: [phone]      Referred by: Irene Shipper RN [facility]       Source of Information: Medical record      Home Health indicators present: may need home skilled services SN       Barriers to discharge to be addressed: none      Question Patient? No      Plan:  [facility] Home care aware of home care referral. [facility] will  continue to follow progress towards discharge, and will assist in planning a safe discharge to  Home with appropriate services.    Clemens Catholic RN, Home Care Coordinator  [facility]  Cell [phone] Fax [phone]  [facility] [phone] opt 4

## 2020-09-06 ENCOUNTER — Inpatient Hospital Stay: Payer: Medicare (Managed Care)

## 2020-09-06 ENCOUNTER — Telehealth: Payer: Self-pay

## 2020-09-06 DIAGNOSIS — I6523 Occlusion and stenosis of bilateral carotid arteries: Principal | ICD-10-CM

## 2020-09-06 DIAGNOSIS — R001 Bradycardia, unspecified: Secondary | ICD-10-CM

## 2020-09-06 NOTE — Discharge Summary (Signed)
Name: Brandyn Thien MRN: [id number] DOB: 1945-11-15     Admit Date: 09/04/2020   Date of Discharge: 09/05/2020     Patient was accepted for discharge to   Home or Self Care [1]           Discharge Attending Physician: Randolm Idol, MD      Hospitalization Summary    CONCISE NARRATIVE: Name: Jalexia Lalli    MRN: [id number]  DOB: 06-02-1945  Admit Date: 09/04/2020  Date of Discharge: 09/05/20    Discharge Attending Physician: Randolm Idol, MD    Hospitalization Summary    CONCISE NARRATIVE: Garielle is 75 y.o. year old female admitted on 09/04/2020 for asymptomatic right carotid artery stenosis.  On  09/04/20, Esme had a RIGHT TCAR.   Postoperatively, the patient experienced no new neurologic events. Patient remains neurologically intact. No cranial nerve injury present at discharge. Other complications included: asymptomatic sinus bradycardia HR 30-40s. She complained of baseline dizziness and remained hemodynamically stable. Cardiology consulted and discharged with 48 hour e-patch monitor and follow up with her cardiologist Dr Kerin Ransom.      Upon discharge, Chae was experiencing no pain and tolerating a regular diet.      Modified Rankin score at discharge = 0 - No symptoms.    R neck incision: no evidence of hematoma, dressing clean, dry, and intact and open to air.   L groin venous access: CDI    Erial was discharged home and will follow up with PCP and Vascular Surgery as scheduled.    VQI Discharge Medications:  Is the patient being discharged on the following?     ASA: yes  Statin: yes  ACE inhibitor/ARB: no  P2Y12 Antagonist: clopidogrel  Beta blocker: no  Anticoagulant: none          OR PROCEDURE: SURGEON:  Randolm Idol, MD  CO-SURGEON:    ASSISTANT:  Levora Dredge, MD  SURGERY DATE:  09/04/2020    PREOPERATIVE DIAGNOSIS:  Bilateral critical carotid artery stenosis.    POSTOPERATIVE DIAGNOSIS:  Bilateral critical carotid artery stenosis.    OPERATIVE PROCEDURE:  Right  trans carotid artery stent placement with flow reversal and ultrasound-guided access of the left common femoral vein.    ANESTHESIA:  General.    INDICATIONS:  Ms. Casados is a 75 year old woman who was identified to have bilateral critical carotid artery stenosis that was asymptomatic.  Given her severe underlying pulmonary disease, it was decided TCAR would be in her best interest.  Patient and her family were informed of the potential risks, complications, benefits, and alternatives to the procedure including, but not limited to, bleeding, infection, and need for further procedure.  Patient acknowledged understanding, and informed consent was obtained.    DESCRIPTION OF PROCEDURE:  Patient was transported to the operating room, placed on the operating table in a supine position.  All bony prominences were padded.  Bilateral groins and the right neck were prepped and draped in usual sterile fashion with a chlorhexidine solution.  Two patient identifier timeout was performed, and preoperative antibiotics were administered.       Transverse incision was made over the clavicle.  This was carried down through the skin and subcutaneous tissues.  Platysma was transected.  Rafi of the sternocleidomastoid was identified and bluntly dissected.  The internal jugular vein was retracted out of the way, and the common carotid artery was identified.  This was encircled with a vessel loop.  Vagus nerve was not seen.  The  patient was systemically anticoagulated with 8000 units of heparin and once confirmed a therapeutic ACT, a 5-0 Prolene was used to perform a pursestring suture in the common carotid artery.  Micropuncture needle was used to access through the pursestring, and a microwire was advanced.  We were happy with this position.  This was exchanged out for a micro sheath.  Carotid angiogram was performed.  In an AP view, we had a great picture of the bifurcation.  TCAR sheath was then placed. The left common femoral  vein was directly accessed with a micro needle under direct ultrasound guidance, and a wire was advanced without difficulty.  This was exchanged out for a micro sheath, Bentson wire was placed, and the device sheath was then placed.  Passive flow reversal was then hooked up.  We performed a TCAR time-out confirming a therapeutic ACT.  Patient had been premedicated with glycopyrrolate.  5.5 x 30 mm balloon was then advanced and angioplastied after the clamp was placed.  9 x 40 stent was then advanced and deployed across the lesion into the internal carotid artery at the distal end in the common carotid artery.  We waited 2 minutes, and then we performed an angiogram.  This showed excellent resolution of the stenosis.  We performed an oblique view and confirmed.  We were happy with this result, wire was removed, and clamp was removed after the flow reversal was disconnected.  Patient was reversed with protamine.  The sheath was removed from the common carotid artery, and pursestring was used to cinch down.  The venous access sheath was removed, and manual pressure was held for hemostasis.  Hemostasis was achieved in the neck incision, and the platysma was closed with 2-0 Vicryl and the skin with Monocryl and Dermabond.  Patient tolerated procedure well.  She was emerged from anesthesia, following commands, moving all 4 extremities.  All instrument counts were correct at the end of the case.  I, Tsosie Billing, was present for the entirety of the procedure.    FLOW REVERSAL TOTAL TIME:  15 minutes.    CONTRAST USED:  30 mL.    TOTAL FLUORSCOPY TIME:  7.2 minutes.            SIGNIFICANT MED CHANGES: None    CONSULTANT SERVICE    Billie Ruddy, MD Cardiology             Signed: Carnella Guadalajara, NP  On: 09/06/2020  at: 11:27 AM

## 2020-09-06 NOTE — Telephone Encounter (Signed)
Patient name- [name]    Name of person who is calling-Natalie    Hospital name[facility]    Callers Contact Name and Number- [phone]    Patient hospital discharged date- 09/06/20    Does patient have transportation: no  Was the patient diagnosed with COVID during the hospitalization or had a + COVID test in the last 10 days?   Date of COVID test? No covid     Please schedule appointment with the following conditions      1) Appointment is at least 10 day s after COVID+ test       AND no covid      2) fever has resolved (and they are off of fever reducing medication like ibuprofen or Tylenol) the entire 24 hours before appt date      AND      3) COVID symptoms are improving for the entire 24hrs before apt date     4) If patient wants appointment to be seen sooner than 10 days, please offer Zoom appointment      Appointment date with Provider : 5/6 Dr. Laurance Flatten at 8

## 2020-09-06 NOTE — Progress Notes (Signed)
HOME CARE AGENCY CHOICE    Medicare regulations require that hospitals advise patients that they may choose to receive services from any home health agency.  Patient/family choice of agency is noted with an [x]  by that agency's name.      Agencies: [x]   [employer] ([address] [place], [place], [place], [place], [place] and [place] counties)       []   [place]       []   [place]      []   [facility]        []   [facility])        []   Other ____________________      Home Care Agency Representative alerted:    Name: Gladstone Lighter     Date: 09/05/2020    Time: 3:30pm     *[facility] along with [facility], [facility] and [place], [facility], and [facility] are all part of the [place] [facility] [place]'s health care system.

## 2020-09-06 NOTE — Discharge Instructions (Signed)
Brief Summary of your Hospital Stay (including key procedures and diagnostic test results):  You were admitted to [facility] on 09/04/2020  5:32 AM for Right Sided Carotid Artery Stenosis.  After discussion with Dr. Lissa Merlin, Mort Sawyers, MD regarding the risks and benefits, you underwent a TCAR procedure (Transcarotid Artery Revascularization. A small incision is made just above your clavicle and a stent is placed in your carotid artery to re-establish blood flow. A special filter is used to ensure that particles of plaque do not travel to your brain during the procedure.  Post-operatively your heart rate was significantly lower than normal likely due to operating near heart rate receptors in your carotid artery. You were seen by the hospital cardiology team who recommended monitoring you in the hospital for an additional day. Your heart rate continued to be low therefore an E-Patch monitor was placed on your chest to continue surveillance for the next 48 hours. You have a follow up appointment with Dr Kerin Ransom (your cardiologist) on 09/19/20. Prior to discharge, you tolerated a diet, voiding freely and your pain was controlled with oral medications.Your right chest incision site is open to air and closed with dermabond. Your left groin venous access site is open to air. Both are clean, dry, and intact without redness, drainage, hematoma (blood collection under the skin) or concern for infection. Dr. Lissa Merlin deemed you stable for discharge. You are to follow-up with your Primary Care Doctor and Vascular Surgery in clinic as scheduled.     Call Dr. Lissa Merlin [phone] for   Fever of 101F. or greater  Shaking chills  Uncontrolled pain  If you develop any redness, drainage or swelling from the right neck incision or left groin access site  If you cannot reach the provider above, call [phone] (24hs/day, 7 day/week) and ask to page the vascular surgery attending on call.    ****If you develop any TIA/CVA symptoms (sudden onset  of numbness or weakness to one side of the body, facial droop, difficulty speaking, vision loss, or severe headache) please seek immediate medical attention by calling [phone]****      Other Instructions:   -Keep e-heart monitor in place for 48 hours. Follow up with your cardiologist on 09/19/20.   -Please keep your [facility] about 30 degrees when you are sleeping. This can be done by placing 3 pillows under your head at night.     Diet: Regular    Activity: Do not lift heavy objects greater than 10 lbs  Avoid any strenuous exercise (including sexual activity) for the next 2-3 weeks  Do not drive until cleared by Dr Lissa Merlin at 1 month follow up      Wound Care:  Wash incision with antibacterial soap  Take a shower, pat incision dry and keep dry  Do not bathe or immerse in water for two weeks after your date of surgery  Keep Incision clean, dry, and intact. Leave open to air and monitor for signs and symptoms of infection (increased redness, warmth, drainage, worsening pain)    Pain Management: Resume home medications    It is important to keep your pain controlled so that you are able to take deep breaths and cough. However, the use of a narcotic pain medication can be constipating. Please continue to walk, drink plenty of fluids, and use an over the counter stool softener such as Colace (Docusate Sodium) and/or Senna as needed for constipation. Do not operate heavy machinery, drive, or make important decisions while on narcotic pain medications because  narcotics may impair your reflex speed and judgement. You may wean your narcotic pain medicines as tolerated to regular over-the-counter pain medicines such as acetaminophen (Tylenol - avoid if you have liver disease) or ibuprofen (Motrin, Advil - avoid if you have kidney disease). Be careful not to exceed 4000mg  of acetaminophen in a day from all sources (Tylenol with Codeine, Norco and Percocet contain acetaminophen)      Smoking: Smoking can reduce the quality of your wound  healing and increase your chances of wound infections, as well as increase your chances of developing chronic health problems or worsen conditions you already have. If you smoke, you should quit. Smoking cessation information is available for your review to help you quit. Medications to help you quit are available. Ask your doctor Laurance Flatten, Guy Begin, MD) if you would like to receive these medications.       Diabetes: If you are diabetic, check your blood sugar at least daily. Poor blood sugar control can lead to delayed wound healing. Follow up with [name], MD to discuss your diabetic care regimen.      Hand washing:  Hand Washing is your first line of defense against infection in the hospital, out shopping, and at home. In an effort to Prevent  Infection, especially at your incision site(s), please continue to wash your hands with antibacterial soap and water  or  use the alcohol based hand sanitizers frequently.  Keeping a bottle of hand sanitizer near you at all times during your recovery period can save you from a very serious complication if you use it as suggested.  It is especially important to remember to wash your hands before and after eating, using the bathroom, and taking care of your surgical site. Anyone who is helping you at home should be doing the same thing all of the time, especially if they are helping you in any way with your personal care , dressings, or meal preparation. This can be the beginning of developing some very good habits for you, your friends and loved ones! They care and we care too!    Prescriptions Ordered: There are rarely paper Rxs/perscriptions handed out  any longer.   Because we use the computer to order most of them now, Please, make sure you know where to pick up any prescriptions that may have been ordered for you electronically.  We want them filled and available for you to take them at home as you have been directed by your Nurse at your time of discharge from the  hospital.

## 2020-09-06 NOTE — Progress Notes (Signed)
Vascular Surgery Progress Note    Subjective:     [name] is a 75 y.o. female is s/p R TCAR    -bradycardic overnight, some intermittent dizziness when standing up  -Is taking PO, void, flatus   -Denies cp/sob/emesis/new numbness or feeling of weakness    Objective:      BP: (91-124)/(44-56)   Temp:  [35.9 C (96.6 F)-37.2 C (99 F)]   Temp src: Temporal (04/27 0500)  Heart Rate:  [46-51]   Resp:  [16]   SpO2:  [93 %-95 %]     Intake/Output  No intake/output data recorded.    CMP  No results for input(s): NA, K, CL, CO2, CREAT, GLUCOSE, ALT, AST, APHOS, TB, DB, AMY, LIP in the last 168 hours.    No components found with this basename: BUN, LABGLOM, CALCIUM,  LAC    Coags  No results for input(s): INR, PTT in the last 168 hours.    No components found with this basename: APTT    CBC  No results for input(s): WBC, HGB, HCT, PLT in the last 168 hours.    Physical Exam:      General appearance: active and no distress  HEENT: PERRLA, extra ocular movement intact and neck supple with midline trachea. Mild droop at right face with smile. Tongue midline  CV: regular rate and rhythm  Lungs:unlabored WOB  Abd: Soft, NT, ND  Extremities:  R/L femoral pulse palp  R/L DP multimpashic  R/L PT multipahsic  Motor intact BUE/BLE   Sensory intact BUE/BLE    Neuro: normal without focal findings, mental status, speech normal, alert and oriented x3, PERLA and cranial nerves 2-12 intact    Assessment:   [name] is a 75 y.o. 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    Plan:     E/F:  Replete prn  Diet: regular  Cardiac: statin, DAPT, potential cardiology consult   Pulmonary: inhalers   Endo: repeat dexamethasone dose   GI: regular diet, PPI, bowel reg  Uro: foley out, voiding   Abx: periop   Anti-platelet: ASA/plasvix   DVT prophylaxis: SQH  Pain: multimodal    Overall plan for the day: Emonitor per cardiology, anticipate discharge home     Please  page the vascular surgery resident on call for any questions.    Juanna Cao, MD  Vascular Surgery  09/06/2020 at 5:54 AM

## 2020-09-06 NOTE — Progress Notes (Signed)
Patient cleared for discharge today, 09/06/2020.  Patient with mask applied per [facility] policy and writer with PPE mask and gloves in place.  Writer reviewed discharge instructions with patient and her son, including appointments, restrictions, signs and symptoms of infection, incisional care, and medication instructions.  No new prescriptions for this admission. IV removed by Baxter Flattery, RN.  Automatic blood pressure cuff provided for home monitoring.  Patient and son in agreement with discharge instructions and denies further questions.  Patient dressed appropriately and transported with all belongings via wheelchair to patient discharge to meet her son at 32 for home.

## 2020-09-06 NOTE — Progress Notes (Addendum)
09/06/20    CARDIOLOGY FOLLOW UP    History of Present Illness     Patient seen and examined.  No acute overnight events.  Ambulating to bathroom with significant dizziness or instability.    Past Medical and Surgical History     Past Medical History:   Diagnosis Date    Allergic rhinitis 09/30/2014    Aortic stenosis 04/14/2017    Moderate     Arthritis     Asthma     CAD (coronary artery disease)     Carotid stenosis, bilateral 10/31/2019    6/21- incidental finding during hospitalization, >70% 9/21- Severe, surgery delayed due to odontoid fracture winter 2021 1/22- severe, recommending   cardiac  clearance and then CEA vs TCAR    Chronic obstructive pulmonary disease 06/30/2013    PFTs 2/19: FEV1/FVC 0.65, FEV1 74% 3/21:Fev1/fvc 0.65, FEV1 64%  FEV1 has decreased by 16% and FVC has decreased by 10% . Transitioned to trelegy  12/21- Fev1/FVC 67 ; FEV1 73% The values have increased compared to previous testing in March 2021. The FEV1 and FVC increased by 14%  and 10% respectively.    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    Procedure: ORBIT ORIF;  Surgeon: Jannett Celestine, DDS;  Location: [facility] MAIN OR;  Service: OMFS    TONSILLECTOMY AND ADENOIDECTOMY       Medications and Allergies     Current Outpatient Medications   Medication Instructions    acetaminophen (TYLENOL) 1,000 mg, Oral, 3 TIMES DAILY    albuterol HFA (PROVENTIL, VENTOLIN, PROAIR HFA)  108 (90 Base) MCG/ACT inhaler 2 puffs, Inhalation, EVERY 4-6 HOURS PRN    ARTIFICIAL TEARS (LUBRIFRESH PM,GENTEAL PM) ointment Please administer every 6 hours to both eyes    aspirin 81 mg, Oral, DAILY    atorvastatin (LIPITOR) 80 mg, Oral, DAILY, Replacing 40mg  dose; please cancel refills    azelastine (ASTELIN) 0.1 % nasal spray 1-2 sprays, Nasal, 2 TIMES DAILY    Cetirizine HCl (ZYRTEC PO) 2 tablets, Oral, DAILY    Cholecalciferol 5,000 units, Oral, EVERY MORNING    clopidogrel (PLAVIX) 75 mg, Oral, DAILY    docusate sodium (COLACE) 100 mg, Oral, 2 TIMES DAILY    fluticasone (FLONASE) 50 MCG/ACT nasal spray 2 sprays, Nasal, DAILY    fluticasone-umeclidinium-vilanterol (TRELEGY ELLIPTA) 200-62.5-25 MCG/INH diskus inhaler 1 puff, Inhalation, DAILY    gabapentin (NEURONTIN) 300 mg, Oral, 3 TIMES DAILY    ibuprofen (ADVIL,MOTRIN) 600 mg, Oral, 3 TIMES DAILY PRN    ipratropium-albuterol (DUONEB) 0.5-2.5mg  /59mL nebulizer solution 3 mLs, Nebulization, EVERY 6 HOURS    Misc. Devices (CPAP) machine Download and compliance report    montelukast (SINGULAIR) 10 mg, Oral, NIGHTLY  Non-System Medication Medication/Supply: R thumb spica brace<BR>Directions for Use: wear nightly    pantoprazole (PROTONIX) 40 mg, Oral, DAILY BEFORE BREAKFAST, Swallow whole. Do not crush, break, or chew.    polyethylene glycol (GLYCOLAX,MIRALAX) 17 g powder packet Oral, DAILY PRN    sertraline (ZOLOFT) 100 mg, Oral, DAILY, Replacing 50mg  dose, please cancel other refills         Review of Systems   Positives & pertinent negatives per HPI. All other systems reviewed and are negative.    Vitals and Physical Exam   BP 116/54 (BP Location: Left arm)    Pulse (!) 43    Temp 35.4 C (95.7 F) (Temporal)    Resp 16    Ht 1.7 m (5' 6.93")    Wt 109.5 kg (241 lb 8 oz)    LMP  (LMP Unknown)    SpO2 95%    BMI 37.90 kg/m     GEN: Appears comfortable. No acute distress.   HENT: Normocephalic, atraumatic.  PULM: CTABL. Normal WOB.  CARD:  Bradycardic in 40-50s. Regular rhythm. Crescendo decrescendo SEM.  GI: Non-tender. Non-distended.  SKIN: Warm and dry. No erythema or bruising.  NEURO: No focal deficit. Mentation at baseline.    Laboratory Data   Hematology:   Results in Past 730 Days  Result Component Current Result Previous Result   WBC 7.4 (08/25/2020) 8.0 (02/09/2020)   Hemoglobin 12.4 (08/25/2020) 12.9 (02/09/2020)   Hematocrit 39 (08/25/2020) 41 (02/09/2020)   Platelets 264 (08/25/2020) 312 (02/09/2020)     Chemistry:   Results in Past 730 Days  Result Component Current Result Previous Result   Sodium 143 (07/17/2020) 143 (02/09/2020)   Potassium 4.6 (07/17/2020) 4.8 (02/09/2020)   Creatinine 0.72 (07/17/2020) 0.78 (02/09/2020)   Glucose 92 (07/17/2020) 102 (H) (02/09/2020)   Calcium 9.8 (07/17/2020) 10.1 (02/09/2020)   Magnesium 2.0 (11/07/2019) 2.1 (11/04/2019)   Hemoglobin A1C 6.1 (H) (08/25/2020) 6.1 (H) (07/17/2020)   AST 31 (07/17/2020) 57 (H) (10/25/2019)   ALT 22 (07/17/2020) 39 (H) (10/25/2019)     Coagulation Studies:   Results in Past 730 Days  Result Component Current Result Previous Result   aPTT 35.6 (01/03/2020) 22.4 (L) (10/25/2019)   INR 1.0 (08/25/2020) 1.2 (H) (01/03/2020)     Cardiac:   Results in Past 730 Days  Result Component Current Result Previous Result   TROP T 0 HR High Sensitivity 13 (01/03/2020) 25 (H) (10/26/2019)   TROP T 3 HR High Sensitivity 16 (H) (01/03/2020) 23 (H) (10/26/2019)   NT-pro BNP 64 (01/03/2020) Not in Time Range     Lipids:   Results in Past 730 Days  Result Component Current Result Previous Result   Cholesterol 162 (07/17/2020) 132 (08/11/2019)   HDL 43 (07/17/2020) 36 (L) (08/11/2019)   Triglycerides 143 (07/17/2020) 140 (08/11/2019)   LDL Calculated 90 (07/17/2020) 68 (08/11/2019)   Chol/HDL Ratio 3.8 (07/17/2020) 3.7 (08/11/2019)       Impression and Plan     75 y.o. female with PMHx significant for bilateral carotid artery stenosis, aortic stenosis, severe OSA, COPD, and s/p CABG in 2013, with history DVT/PE no longer on Eliquis, who is now POD  #2 from R TCAR who we were consulted for post-op bradycardia.    There is no evidence of AV block or other bradyarrhythmias on telemetry therefore this is likely sinus bradycardia. It seems her symptom of lightheadedness is long standing and related to positional changes, she denies syncope/presyncope symptoms. It is most likely that the sinus bradycardia is  secondary to increased vagal tone following the surgery.     Recs:    -Patient can be sent home with E-patch monitor on discharge and follow up with her outpatient cardiologist  -no further workup necessary at this time    This note was dictated using Dragon voice recognition software. A reasonable effort was made to proofread this note but there may be minor transcription/typographical errors.     Gordan Payment, MD PGY-5  Cardiovascular Disease Fellow  [facility] 09/06/20  9:17 AM        Cardiology attending note:  I have personally seen and examined this patient, and I have reviewed relevant notes and data. I have reviewed the above note of the cardiology fellow and have made changes to the note as appropriate regarding the data, findings, impressions and / or plan of care as documented. Please see our detailed recommendations above.   Billie Ruddy, MD

## 2020-09-06 NOTE — Telephone Encounter (Signed)
Left message for return to call

## 2020-09-06 NOTE — Progress Notes (Signed)
Patient interview -  [facility])    Home visit address/ contact phone confirmed:  Yes/yes    (1) Signs or symptoms of a respiratory infection such as fever, cough, shortness of breath or sore throat; or no   (2) Persons who have had contact, in the last 14 days, with someone with a confirmed diagnosis of COVID-19, or under investigation for COVID-19, or are ill with respiratory illness. no    Does patient have technology in the home capable of virtual zoom visits (ie: smart phone, ipad, tablet, computer with camera) and does patient have the ability to use?yes smart phone     Has the patient had any falls (with or without injury) in the last 3 months? yes  Has the patient had any near misses (where they would have fallen, but caught self) in the last 3 months? no      Pets/ Smoking:no/no    Home set up/ lives with/designated caregiver (name and phone #) lives alone :   Merril Abbe (Daughter of Age)   [phone]     ADL needs:supportive daughters     Current DME or supplies in home:rolling walker     Medication management method:self uses pill box    Pneumococcal vaccine given and date      PNEUMOCOCCAL 13(PREVNAR 13) 09/14/2015   PNEUMOCOCCAL(PNEUMOVAX) 09/20/2016            Influenza vaccine given and date   INFLUENZA, HIGH DOSE [place] 02/15/2020         COVID vaccine given and dates.    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

## 2020-09-06 NOTE — Progress Notes (Signed)
HOME CARE DISCHARGE PLAN    The following services have been arranged with [facility] [phone]/ [phone]:  __xx____Nursing   _  The agency will call to schedule first home visit date for [facility] services within 24-48 hours of facility discharge, and pt/family are in agreement.    Special instructions for scheduling first visit none    Blood draw is not applicable __x___    Focus of care and identified patient/caregiver teaching needs at home right carotid artery stenosis  asymptomatic sinus bradycardia HR 30-40s skilled home care services SN symptom disease and medication assessment teaching and management     Follow-up MD appts scheduled:  May6 Transitional Care Mgmt with [name], MD  Friday Sep 15, 2020 8:00 AM   [facility]  [address]   [place] [address]  [phone]     [id number] Follow up with Trilby Leaver, MD  Tuesday Sep 19, 2020  3PM with your cardiologist [place]  [phone]         The above arrangements are based on an in-hospital evaluation. It is short-term and will be re-evaluated by the [place] Nurse in the home on a regular basis, as per physicians order.      Clemens Catholic RN, [place]  [facility] [phone]

## 2020-09-06 NOTE — Progress Notes (Signed)
Report Given To  Latoya Flattery RN [phone]       Descriptive Sentence / Reason for Admission   S/p R TCAR 4/25 for incidental findings of severe bilateral stenosis of the carotid arteries  Hx: Arthritis, asthma, CAD DVT, PE, CABG, COPD on CPAP    Verified by: Latoya Bors, RN        Active Issues / Relevant Events   Neuro: A&O x 3  Telemetry: SB  Tubes/Wires: N/A  Pulmonary: RA   GI/Nutrition: Reg diet. Gas - Y. Last BM PTA  Renal: spont  Endocrine: N/A  Mobility: activity as tolerated  Continuous IV medications: n/a  Pump/rate verified date/time:  Pain control: tyl ATC  IV access: PIV R hand  Wounds: R neck, L groin  Orders Released: Yes   Weight:  Tele Box #: 11  Pre-Op Unit:  Last COVID swab:  Miscellaneous:   4/26- Patient bradycardic 32-40s overnight, and at times hypotensive, PRN Atropine ordered for a sustain HR less than 35, notify MD first before pushing.    Verified by: Latoya Bors, RN        To Do List  -CPAP at night  -Q4 neuros, mod NIH  -Keep SBP in the 90-140s  -Duo neb treatments    Verified by: Latoya Bors, RN       Anticipatory Guidance / Discharge Planning  Expected [place] Date: TBD  From: home  Plan: home  Transport: TBD    [name], daughter, [phone] updated 09/04/20 @ 1700    Verified by: Latoya Bors, RN

## 2020-09-06 NOTE — Progress Notes (Addendum)
[facility] MD CONFIRMATION    Confirmed signing MD: [name]   MD Address: [address]  [place] [address]  [phone]    MD Phone / Fax: : [phone]; Fax: [phone]  Confirmed by:  Per chart review MD confirmed   Date of confirmation: 09/05/20    ADDENDUM:  Luan Moore, RN from PCP office called 4/28 to give approval as well.    Clemens Catholic RN, Home Care Coordinator  [place]  Cell [phone] Fax [phone]  [place] [phone] opt 4

## 2020-09-07 ENCOUNTER — Other Ambulatory Visit: Payer: Self-pay

## 2020-09-07 NOTE — Telephone Encounter (Signed)
[name] returned call.  Gave PCP verbal okay to [facility] home [facility] services.  [name] stated she will let them know this pt is to start receiving home care services.

## 2020-09-07 NOTE — Progress Notes (Signed)
In System Transitions Care Management Documentation:     Hospital Admission Date/Discharge Date:   09/04/2020 To 09/06/2020     Discharge Diagnosis at Latoya time of discharge:    1. R TCAR 09/04/2020    2. Carotid artery stenosis    Rhodes. Carotid stenosis    4. Bradycardia         Hospital Area Discharged From:   [facility])     Discharge Disposition:    Home or [place] at Discharge:         Rhodes of Hospital Stay: CONCISE NARRATIVE: Name: [name]    MRN: [id number]  DOB: 1945-10-08  Admit Date: 09/04/2020  Date of Discharge: 09/05/20    Discharge Attending Physician: Latoya Idol, Rhodes    Hospitalization Summary    CONCISE NARRATIVE: [name] is 75 y.o. year old female admitted on 09/04/2020 for asymptomatic right carotid artery stenosis.  On  09/04/20, [name] had a RIGHT TCAR.   Postoperatively, Latoya patient experienced no new neurologic events. Patient remains neurologically intact. No cranial nerve injury present at discharge. Other complications included: asymptomatic sinus bradycardia HR 30-40s. She complained of baseline dizziness and remained hemodynamically stable. Cardiology consulted and discharged with 48 hour e-patch monitor and follow up with her cardiologist Dr [name].      Upon discharge, [name] was experiencing no pain and tolerating a regular diet.      Modified Rankin score at discharge = 0 - No symptoms.    R neck incision: no evidence of hematoma, dressing clean, dry, and intact and open to air.   L groin venous access: CDI    [name] was discharged home and will follow up with PCP and Vascular Surgery as scheduled.    VQI Discharge Medications:  Is Latoya patient being discharged on Latoya following?     ASA: yes  Statin: yes  ACE inhibitor/ARB: no  P2Y12 Antagonist: clopidogrel  Beta blocker: no  Anticoagulant: none      Medications: New, Changed, or Stopped:     Medication List      CHANGE how you take these medications    acetaminophen 500 mg  tablet  Commonly known as: TYLENOL  Take 2 tablets (1,000 mg total) by mouth Rhodes times daily  What changed:    when to take this   reasons to take this  Notes to patient: Take as directed as needed for pain.            Future Appointments        Sep 15 2020   08:00 AM - Transitional Care Mgmt  [place], Rhodes           Sep 19 2020   03:00 PM - Office Visit  [name] - [name]          Sep 27 2020   11:15 AM - SPIROMETRY  [name]           Sep 27 2020   11:30 AM - FOLLOW UP VISIT  [name] - Latoya Course, NP           Oct 18 2020   09:30 AM - CAROTID ULTRASOUND  [name]  Displaying Latoya next Rhodes appointments. This patient has additional appointments scheduled.         Upcoming Appointments and To Do's:    Your To Do List     [name] On Rhodes/10/2020.    Specialties: Primary Care, Family Medicine  Why: 8AM with your PCP  Contact information  [place] [address]  [phone]             Latoya Idol, Rhodes On 10/18/2020.    Specialties: Vascular Surgery, Surgery  Why: at 9:30 am for ultrasound then appt at 10:15 pm with provider  Contact information  [address]  [address]   [place] [address]  [phone]             Care, [place].    Specialty: [place]  Why: [facility] home care to call to set up home visit  Contact information  [address] [address]  [phone]             [name] On Rhodes/02/2021.    Specialty: Cardiology  Why: 3PM with your cardiologist  Contact information  [place] [address]  [phone]                       [name]   09/07/2020      TCM POST DISCHARGE CONTACT Rhodes OUTREACH    Care Management  Intervention: Care Rhodes Intervention Completed       Risk of Admission or ED Visit  Current as of 6 hours ago      89% 40 - 100%: High Risk   20 - 40%: Medium Risk   0 - 20%: Low Risk     Last Change:         This score indicates an adult patient's 1-year risk, as a percentage, of a hospital admission or ED visit.      View model formula and coefficients          Current PCP:  [name]                Spoke To:: patient      Are you feeling as good as you did when you left Latoya hospital?: Yes         Has home care agency contacted patient yet?: No (she said a [facility] VN is going to contacy her)      If equipment was ordered at time of discharge, does Latoya patient have this in Latoya home?: N/A       Is Latoya patient aware of pending orders / testing? : Yes (has on a heart monitor)      Do you have any questions about your discharge instructions?: No      Do you have an appointment scheduled with your PCP and know Latoya date and time of that appointment?: Yes      Do you have transportation to get to that appointment?: Yes      Do you have all of your medications listed on your discharge summary and are you taking them as they are written on Latoya bottle?: Yes         Discharge medications reviewed against outpatient MEDICAL RECORD NUMBER: Yes      Do you have any questions or concerns about  your medications?: No         If Latoya patient is newly initiated on anticoagulation, are they aware who is managing this?: N/A      Reviewed medications with:: patient      Time spent on Latoya call with Latoya patient:: 10 minutes       [name] says she is stuffy and has a cough.  She said she had it before Latoya surgery, also.  She said she has this at Latoya change of Latoya seasons.  She has an Aid from [employer] that comes on [place].    She lives alone and they told her no driving for a month so she will have one of her children drive her to her appointments.  She, also, has Research officer, trade union.    She is wearing a heart monitor  because her heart rate was very low after Latoya insertion of Latoya stent.  She will sent it back to them tomorrow.  This was a preplanned surgery and will have another one when Cardiology schedules it.  After speaking with [name] and reviewing Latoya chart there doesn't appear to be Latoya need for care management at this time.   I informed Chloeanne that if they have any further questions or concerns, they can call Latoya office or me.

## 2020-09-07 NOTE — Telephone Encounter (Signed)
Called and LVM for return call.

## 2020-09-07 NOTE — Progress Notes (Addendum)
In System Transitions Care Management Documentation:     Hospital Admission Date/Discharge Date:   09/04/2020 To 09/06/2020     Discharge Diagnosis at the time of discharge:    1. R TCAR 09/04/2020    2. Carotid artery stenosis    3. Carotid stenosis    4. Bradycardia         Hospital Area Discharged From:   [facility] (HOSP)     Discharge Disposition:    Home or Self Care    Pending Labs at Discharge:         Course of Hospital Stay: CONCISE NARRATIVE: Name: [name]    MRN: [id number]  DOB: 11/12/1945  Admit Date: 09/04/2020  Date of Discharge: 09/05/20    Discharge Attending Physician: Ellis, Jennifer Lynn, MD    Hospitalization Summary    CONCISE NARRATIVE: [name] is 74 y.o. year old female admitted on 09/04/2020 for asymptomatic right carotid artery stenosis.  On  09/04/20, Brizeyda had a RIGHT TCAR.   Postoperatively, the patient experienced no new neurologic events. Patient remains neurologically intact. No cranial nerve injury present at discharge. Other complications included: asymptomatic sinus bradycardia HR 30-40s. She complained of baseline dizziness and remained hemodynamically stable. Cardiology consulted and discharged with 48 hour e-patch monitor and follow up with her cardiologist Dr Ritter.      Upon discharge, [name] was experiencing no pain and tolerating a regular diet.      Modified Rankin score at discharge = 0 - No symptoms.    R neck incision: no evidence of hematoma, dressing clean, dry, and intact and open to air.   L groin venous access: CDI    [name] was discharged home and will follow up with PCP and Vascular Surgery as scheduled.    VQI Discharge Medications:  Is the patient being discharged on the following?     ASA: yes  Statin: yes  ACE inhibitor/ARB: no  P2Y12 Antagonist: clopidogrel  Beta blocker: no  Anticoagulant: none      Medications: New, Changed, or Stopped:     Medication List      CHANGE how you take these medications    acetaminophen 500 mg  tablet  Commonly known as: TYLENOL  Take 2 tablets (1,000 mg total) by mouth 3 times daily  What changed:   · when to take this  · reasons to take this  Notes to patient: Take as directed as needed for pain.            Future Appointments        Sep 15 2020   08:00 AM - Transitional Care Mgmt  [facility] - Jillian Moore, MD           Sep 19 2020   03:00 PM - Office Visit  [facility] [place] - Ritter, Nathan, MD          Sep 27 2020   11:15 AM - SPIROMETRY  [facility] PULMONARY LAB 3           Sep 27 2020   11:30 AM - FOLLOW UP VISIT  [facility] - Tina M Parmenter, NP           Oct 18 2020   09:30 AM - CAROTID ULTRASOUND  [facility] Dept of [facility],Division of Vascular Surgery - VASCULAR SURGERY, TECH 5              Displaying the next 5 appointments. This patient has additional appointments scheduled.         Upcoming Appointments and To Do's:    Your To Do List     Moore, Jillian, MD On 09/15/2020.    Specialties: Primary Care, Family Medicine  Why: 8AM with your PCP  Contact information  [address]  [address]  [place] [address]  [phone]             Ellis, Jennifer Lynn, MD On 10/18/2020.    Specialties: Vascular Surgery, Surgery  Why: at 9:30 am for ultrasound then appt at 10:15 pm with provider  Contact information  [address]  [address]  [place] [address]  [phone]             [facility], [facility] [place] [facility].    Specialty: Home Health Services  Why: [facility] home care to call to set up home visit  Contact information  [address]  REFERRAL INFORMATION  [place] [address]  [phone]             Ritter, Nathan, MD On 09/19/2020.    Specialty: Cardiology  Why: 3PM with your cardiologist  Contact information  [address] [place] [address]  [address]   [place] [address]  [phone]                       Caila Cirelli, RN, Clinical Care Manager   09/07/2020      TCM POST DISCHARGE CONTACT 3 OUTREACH    Care Management  Intervention: Care Manager Intervention Completed       Risk of Admission or ED Visit  Current as of 6 hours ago      89% 40 - 100%: High Risk   20 - 40%: Medium Risk   0 - 20%: Low Risk     Last Change:         This score indicates an adult patient's 1-year risk, as a percentage, of a hospital admission or ED visit.      View model formula and coefficients          Current PCP:  Jillian Moore, MD                Spoke To:: patient      Are you feeling as good as you did when you left the hospital?: Yes         Has home care agency contacted patient yet?: No (she said a [facility] VN is going to contacy her)      If equipment was ordered at time of discharge, does the patient have this in the home?: N/A       Is the patient aware of pending orders / testing? : Yes (has on a heart monitor)      Do you have any questions about your discharge instructions?: No      Do you have an appointment scheduled with your PCP and know the date and time of that appointment?: Yes      Do you have transportation to get to that appointment?: Yes      Do you have all of your medications listed on your discharge summary and are you taking them as they are written on the bottle?: Yes         Discharge medications reviewed against outpatient medical record:: Yes      Do you have any questions or concerns about   your medications?: No         If the patient is newly initiated on anticoagulation, are they aware who is managing this?: N/A      Reviewed medications with:: patient      Time spent on the call with the patient:: 10 minutes       [name] says she is stuffy and has a cough.  She said she had it before the surgery, also.  She said she has this at the change of the seasons.  She has an Aid from [employer] that comes on [place].    She lives alone and they told her no driving for a month so she will have one of her children drive her to her appointments.  She, also, has Research officer, trade union.    She is wearing a heart monitor  because her heart rate was very low after the insertion of the stent.  She will sent it back to them tomorrow.  This was a preplanned surgery and will have another one when Cardiology schedules it.  After speaking with [name] and reviewing the chart there doesn't appear to be the need for care management at this time.   I informed [name] that if they have any further questions or concerns, they can call the office or me.

## 2020-09-09 ENCOUNTER — Encounter: Payer: Self-pay | Admitting: Gastroenterology

## 2020-09-09 DIAGNOSIS — Z48812 Encounter for surgical aftercare following surgery on the circulatory system: Secondary | ICD-10-CM

## 2020-09-13 DIAGNOSIS — R001 Bradycardia, unspecified: Secondary | ICD-10-CM

## 2020-09-15 ENCOUNTER — Encounter: Payer: Self-pay | Admitting: Primary Care

## 2020-09-15 ENCOUNTER — Ambulatory Visit: Payer: Medicare (Managed Care) | Attending: Primary Care | Admitting: Primary Care

## 2020-09-15 ENCOUNTER — Encounter: Payer: Self-pay | Admitting: Gastroenterology

## 2020-09-15 VITALS — BP 126/76 | HR 57 | Temp 97.5°F | Ht 66.0 in | Wt 237.0 lb

## 2020-09-15 DIAGNOSIS — Z23 Encounter for immunization: Secondary | ICD-10-CM

## 2020-09-15 DIAGNOSIS — N83201 Unspecified ovarian cyst, right side: Secondary | ICD-10-CM | POA: Insufficient documentation

## 2020-09-15 DIAGNOSIS — R001 Bradycardia, unspecified: Secondary | ICD-10-CM | POA: Insufficient documentation

## 2020-09-15 DIAGNOSIS — I6523 Occlusion and stenosis of bilateral carotid arteries: Secondary | ICD-10-CM | POA: Insufficient documentation

## 2020-09-15 NOTE — Progress Notes (Signed)
[facility] Follow Up Visit Progress Note / Transitional Care Visit    Follow up from hospital admission at [facility] hospital.   Date of hospital admission: 4/25  Date of hospital discharge: 4/25  Date of Initial contact by our office: 4/28    Chief Complaint   Patient presents with    Transition Care Managment(TCM)        Subjective:    Transitional Care Management Initial Contact Info post-hospitalization:     CONCISE NARRATIVE: [name] is 75 y.o. year old female admitted on 09/04/2020 for asymptomatic right carotid artery stenosis.  On  09/04/20, [name] had a RIGHT TCAR.     Postoperatively, the patient experienced no new neurologic events. Patient remains neurologically intact. No cranial nerve injury present at discharge. Other complications included: asymptomatic sinus bradycardia HR 30-40s. She complained of baseline dizziness and remained hemodynamically stable. Cardiology consulted and discharged with 48 hour e-patch monitor and follow up with her cardiologist Dr Kerin Ransom.      Upon discharge, [name] was experiencing no pain and tolerating a regular diet.      Modified Rankin score at discharge = 0 - No symptoms.    R neck incision: no evidence of hematoma, dressing clean, dry, and intact and open to air.   L groin venous access: CDI    [name] was discharged home and will follow up with PCP and Vascular Surgery as scheduled.    VQI Discharge Medications:  Is the patient being discharged on the following?     ASA: yes  Statin: yes  ACE inhibitor/ARB: no  P2Y12 Antagonist: clopidogrel  Beta blocker: no  Anticoagulant: none      09/15/2020 HPI:     Post-op bradycardia prompted hospitalization. Per pt, her  lowest HR during hospitalization was in the 30s. Nurses would keep coming into her room to check on her, she denied having any symptoms. Wore and submitted her 48h epatch which showed normal sinus rhythm. Per pt, wearing her applewatch and pulse has improved to 50s-70s    Neck incision  is doing well. Some redness and tenderness to touch, but no pain. Wound glue is peeling off.          Hospital records were obtained and reviewed in detail today.     Epatch results reviewed with pt today:  Interpretation Summary    The predominant rhythm was normal sinus rhythm. No atrial fibrillation. Rare APCs with total burden of < 0.05%. One episode of non-sustained atrial tachycardia lasting 8 beats in duration. There was one symptomatic event recorded. There was significant baseline artifact during this event and the rhythm cannot be determined. Please see attached PDF for full report details.       The patient's hospital discharge medications were reviewed and reconciled with the patient.  The patient's allergies were reviewed and confirmed with the patient.  The patient's past medical, surgical, social and/or family histories were reviewed and updated.      Objective:  BP 126/76    Pulse 57    Temp 36.4 C (97.5 F)    Ht 1.676 m (5\' 6" )    Wt 107.5 kg (237 lb)    LMP  (LMP Unknown)    BMI 38.25 kg/m    Gen: appears well, NAD, normal mood and affect; answers questions appropriately   Neck: supple, no lymphadenopathy, no thyromegaly, no palpable thyroid nodules. Minimal TTP at incision site. Incision is minimally erythematous, c/d/i.   CV: RRR, +murmur, no rub or  gallop     Assessment and Plan:  1. Carotid stenosis, bilateral  2. Sinus bradycardia  S/p R carotid stent, healing well on exam today  Bradycardia resolved may have been secondary to manipulation around carotid sinus/baroreceptors. Not currently on medication with bradycardic SE. Epatch reviewed today and HR normalized.     3. Right ovarian cyst  Chart review done today and noticed CT urogram from January showed R ovarian cyst. We had discussed repeating CT vs GYN [place] this summer to reassess. Will get GYN [place] later this summer, prior to our next appt.   - Gyn ultrasound scan; Future    4. Immunization due   Discussed current data shows decreased  vaccine effectiveness at 14months and current high risk of breakthrough infection. Agreed to booster today given her risk factors.       - COVID-19 mRNA VAC-TRIS(Pfizer) 78mcg/0.3mL IM SUSP       Follow up:     Future Appointments       Provider [place]    09/27/2020 11:15 AM MED [facility] PULMONARY LAB 3 [facility] PULMONAR    09/27/2020 11:30 AM Parmenter, Serafina Mitchell, NP [facility] PULMONAR    10/18/2020 9:30 AM VASCULAR SURGERY, TECH 5 [facility] Dept of Surgery,Division of Vascular Surgery CV Vasc    10/18/2020 10:15 AM Randolm Idol, MD [facility] Dept of [place] of Vascular Surgery     11/16/2020 1:00 PM [name], MD [facility]     05/29/2021 9:45 AM RIS, RR US2 (RRUS2) [facility] [place]    06/14/2021 10:45 AM Margaretha Glassing, MD [facility]     07/23/2021 1:00 PM [name], MD [place]          [name], MD  [facility]  09/15/2020  8:54 AM

## 2020-09-15 NOTE — Patient Instructions (Addendum)
[facility]  [phone]  Please call to make your GYN [facility] appointment.

## 2020-09-19 ENCOUNTER — Encounter: Payer: Self-pay | Admitting: Gastroenterology

## 2020-09-20 ENCOUNTER — Telehealth: Payer: Self-pay

## 2020-09-20 DIAGNOSIS — Z01812 Encounter for preprocedural laboratory examination: Secondary | ICD-10-CM

## 2020-09-20 NOTE — Telephone Encounter (Signed)
Writer spoke with [place] regarding Covid screen prior to appointment on 5/18. Information provided for [place] to be screened between 5/13 through 5/14.  Instructed to call clinic if unable to be screened so appointment can be rescheduled. Elonda Husky, RN

## 2020-09-22 ENCOUNTER — Ambulatory Visit: Payer: Medicare (Managed Care) | Attending: Pulmonology

## 2020-09-22 ENCOUNTER — Encounter: Payer: Self-pay | Admitting: Gastroenterology

## 2020-09-22 DIAGNOSIS — Z20822 Contact with and (suspected) exposure to covid-19: Secondary | ICD-10-CM | POA: Insufficient documentation

## 2020-09-22 DIAGNOSIS — Z20828 Contact with and (suspected) exposure to other viral communicable diseases: Secondary | ICD-10-CM | POA: Insufficient documentation

## 2020-09-22 DIAGNOSIS — Z01812 Encounter for preprocedural laboratory examination: Secondary | ICD-10-CM | POA: Insufficient documentation

## 2020-09-22 LAB — COVID-19 NAAT (PCR): COVID-19 NAAT (PCR): NEGATIVE

## 2020-09-26 ENCOUNTER — Encounter: Payer: Self-pay | Admitting: Gastroenterology

## 2020-09-26 NOTE — Progress Notes (Signed)
[place] [facility]      Dear Dr. [name], MD,    I had the pleasure of seeing [name] on 09/26/2020 at the [facility] for  re-evaluation of shortness of breath, morbid obesity, asthma, and OSA.  Please allow me to review the past medical history for my records.    Past Medical History:   Diagnosis Date    Allergic rhinitis 09/30/2014    Aortic stenosis 04/14/2017    Moderate     Arthritis     Asthma     CAD (coronary artery disease)     Carotid stenosis, bilateral 10/31/2019    6/21- incidental finding during hospitalization, >70% 9/21- Severe, surgery delayed due to odontoid fracture winter 2021 1/22- severe, recommending   cardiac  clearance and then CEA vs TCAR    Chronic obstructive pulmonary disease 06/30/2013    PFTs 2/19: FEV1/FVC 0.65, FEV1 74% 3/21:Fev1/fvc 0.65, FEV1 64%  FEV1 has decreased by 16% and FVC has decreased by 10% . Transitioned to trelegy  12/21- Fev1/FVC 67 ; FEV1 73% The values have increased compared to previous testing in March 2021. The FEV1 and FVC increased by 14%  and 10% respectively.    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     She is doing well from a pulmonary perspective since last visit.  She continues to have dyspnea with exertion, this is stable.  She reports she has "good days and bad days".  She has an occasional dry cough which has worsened recently due to environmental allergies.  She denies wheezing and chest tightness.  She uses Trelegy daily and albuterol on average a couple of times a month.  Albuterol does help.  She denies GERD symptoms,  fever, chills, and night sweats.  She has not been treated with prednisone for her breathing.  She reports compliance with CPAP-uses this on average about 5 to 8 hours a night and does feel well rested rested.  She continues to work on weight loss she has gained a couple of pounds over the last week because of her birthday.    A complete medication list was reviewed and reconciled.    Pertinent pulmonary medications include:Trelegy, albuterol prn, duoneb prn, Flonase, montelukast CPAP    Allergic reactions were reviewed and updated.     PHYSICAL EXAM & DATA   Blood pressure 137/62, pulse 72, temperature 35.9 C (96.6 F), temperature source Temporal, height 1.676 m (5\' 6" ), weight 111.5 kg (245 lb 12.8 oz), SpO2 96 %. on RA    Gen:   Well appearing, obese No distress, no cyanosis,   Neck:  No cervical or supraclavicular lymphadenopathy. No thyromegaly. No JVD.    Resp:  CTA blaterally   CV:    Regular S1S2, + murmur, no peripheral edema   Neuro: Alert and oriented X 3   SKIN: No rashes or skin lesions     Pulmonary function tests:  Spirometry today reveals an FEV1 of 1.42liters (64% of predicted), an FVC of  2.13 liters (73 % of predicted) and a normal FEV1/FVC ratio of 67 %. Diffusing capacity is 20.34 ml/min/mmHg or  98% of predicted. These values are consistent with normal spirometry however, the appearance of her flow loops support obstruction.     Imaging studies since our last visit:  No new imaging to review.       Impression:  [name] is a 75 y.o. female with dyspnea/asthma, morbid obesity, and OSA following up.  Pulmonary function testing has declined slightly compared to previous testing however, her respiratory symptoms are stable.  She should continue Trelegy.  She should continue to work on weight loss and increase activity. She should continue CPAP for OSA.  she will return for follow up in 6-8 months- will repeat spiro at that time-I have asked her to contact us sooner if respiratory  symptoms worsen. .      My recommendations are:  -Continue Trelegy and albuterol   -Continue montelukast   -Continue CPAP   -RTC in 6-8 months for follow up with Arlyce Harman.       Thank you for allowing me to remain involved in the care of [name]. Please don't hesitate to contact me with any questions or concerns.     Sincerely,   Serafina Mitchell.Thor Nannini, NP  10:55 AM  09/26/2020    I personally delivered 60 minutes of services pre-, during, and after this visit on the date of the visit

## 2020-09-27 ENCOUNTER — Encounter: Payer: Self-pay | Admitting: Pulmonology

## 2020-09-27 ENCOUNTER — Ambulatory Visit: Payer: Medicare (Managed Care) | Attending: Pulmonology

## 2020-09-27 ENCOUNTER — Ambulatory Visit: Payer: Medicare (Managed Care) | Admitting: Pulmonology

## 2020-09-27 VITALS — BP 137/62 | HR 72 | Temp 96.6°F | Ht 66.0 in | Wt 245.8 lb

## 2020-09-27 DIAGNOSIS — R059 Cough, unspecified: Secondary | ICD-10-CM | POA: Insufficient documentation

## 2020-09-27 DIAGNOSIS — J45909 Unspecified asthma, uncomplicated: Secondary | ICD-10-CM

## 2020-09-27 DIAGNOSIS — J449 Chronic obstructive pulmonary disease, unspecified: Secondary | ICD-10-CM

## 2020-09-27 DIAGNOSIS — G4733 Obstructive sleep apnea (adult) (pediatric): Secondary | ICD-10-CM

## 2020-09-27 LAB — PULMONARY FUNCTION TEST
FEV1 (L): 1.42 L
FEV1 Z-score: -2.1
FEV1/FVC Z-score: -1.23
FEV1/FVC: 67 %
FVC (L): 2.13 L
FVC Z-score: -1.58

## 2020-09-27 MED ORDER — ALBUTEROL SULFATE HFA 108 (90 BASE) MCG/ACT IN AERS *I*
2.0000 | INHALATION_SPRAY | RESPIRATORY_TRACT | 11 refills | Status: DC | PRN
Start: 2020-09-27 — End: 2021-03-06

## 2020-10-02 ENCOUNTER — Other Ambulatory Visit: Payer: Self-pay

## 2020-10-02 NOTE — Progress Notes (Signed)
TCM POST DISCHARGE CONTACT 5 OUTREACH      Care Management Intervention: Care Mgr Intervention Completed             Contact 4 Appointment/completion date:: 09/14/20      Spoke To:: patient      Do you have any new symptoms or concerns since we last spoke?: No         Do you know who to call if you begin to have any new problems or symptoms?: No      Have there been any changes to your medications since we last spoke? : No         Do you have all of your medications listed on your discharge summary and are you taking them as they are written on the bottle?: Yes         Do you have any questions or concerns about your medications?: No         Time spent on the call with the patient:: 10 minutes      Patient Care Management assessment and plan of care/next steps:: she is feeling better/has seen the Cardiologist/heart monitor reading was good      Care Management Program Status:: No additional Care Management needs identified at this time. Patient closed to Transitional Care Management.

## 2020-10-13 ENCOUNTER — Ambulatory Visit: Payer: Medicare (Managed Care) | Attending: Primary Care

## 2020-10-13 DIAGNOSIS — N83201 Unspecified ovarian cyst, right side: Secondary | ICD-10-CM | POA: Insufficient documentation

## 2020-10-16 ENCOUNTER — Encounter: Payer: Self-pay | Admitting: Primary Care

## 2020-10-18 ENCOUNTER — Ambulatory Visit: Payer: Medicare (Managed Care) | Admitting: Vascular Surgery

## 2020-10-18 ENCOUNTER — Encounter: Payer: Self-pay | Admitting: Vascular Surgery

## 2020-10-18 ENCOUNTER — Ambulatory Visit
Admission: RE | Admit: 2020-10-18 | Discharge: 2020-10-18 | Disposition: A | Discharge status: Home / Self Care | Payer: Medicare (Managed Care) | Source: Ambulatory Visit | Attending: Vascular Surgery | Admitting: Vascular Surgery

## 2020-10-18 VITALS — BP 102/72 | Ht 67.0 in | Wt 235.0 lb

## 2020-10-18 DIAGNOSIS — I6529 Occlusion and stenosis of unspecified carotid artery: Secondary | ICD-10-CM | POA: Insufficient documentation

## 2020-10-18 DIAGNOSIS — I6523 Occlusion and stenosis of bilateral carotid arteries: Secondary | ICD-10-CM

## 2020-10-18 DIAGNOSIS — Z48812 Encounter for surgical aftercare following surgery on the circulatory system: Secondary | ICD-10-CM | POA: Insufficient documentation

## 2020-10-18 DIAGNOSIS — I6522 Occlusion and stenosis of left carotid artery: Secondary | ICD-10-CM | POA: Insufficient documentation

## 2020-10-18 LAB — CV US CAROTID BILATERAL
Left Carotid Bulb EDV: 10.78 cm/s
Left Carotid Bulb PSV: 33.11 cm/s
Left Common Carotid Artery EDV Dist: 13.95 cm/s
Left Common Carotid Artery EDV Prox: 14.17 cm/s
Left Common Carotid Artery PSV Dist: 58.08 cm/s
Left Common Carotid Artery PSV Prox: 68.01 cm/s
Left External Carotid Artery EDV: 12.76 cm/s
Left External Carotid Artery PSV: 104.82 cm/s
Left ICA/CCA Ratio: 4.29
Left Internal Carotid Artery EDV Dist: 21.87 cm/s
Left Internal Carotid Artery EDV Mid: 52.67 cm/s
Left Internal Carotid Artery EDV Prox: 96.31 cm/s
Left Internal Carotid Artery PSV Dist: 62.53 cm/s
Left Internal Carotid Artery PSV Mid: 169.72 cm/s
Left Internal Carotid Artery PSV Prox: 249.24 cm/s
Left Subclavian Artery EDV: 6.48 cm/s
Left Subclavian Artery PSV: 65.83 cm/s
Left Vertebral Artery EDV: 15.28 cm/s
Left Vertebral Artery PSV: 53.74 cm/s
Right Carotid Bulb EDV: 14.59 cm/s
Right Carotid Bulb PSV: 38.71 cm/s
Right Common Carotid Artery EDV Dist: 21.07 cm/s
Right Common Carotid Artery EDV Prox: 19.67 cm/s
Right Common Carotid Artery PSV Dist: 63.77 cm/s
Right Common Carotid Artery PSV Prox: 79.02 cm/s
Right External Carotid Artery EDV: 12.43 cm/s
Right External Carotid Artery PSV: 84.94 cm/s
Right ICA/CCA Ratio: 1.27
Right Internal Carotid Artery EDV Dist: 27.03 cm/s
Right Internal Carotid Artery EDV Mid: 49.45 cm/s
Right Internal Carotid Artery EDV Prox: 29.23 cm/s
Right Internal Carotid Artery PSV Dist: 81.98 cm/s
Right Internal Carotid Artery PSV Mid: 149.63 cm/s
Right Internal Carotid Artery PSV Prox: 80.86 cm/s
Right Subclavian Artery EDV: 2.42 cm/s
Right Subclavian Artery PSV: 54.07 cm/s
Right Vertebral Artery EDV: 15.38 cm/s
Right Vertebral Artery PSV: 53.82 cm/s

## 2020-10-18 NOTE — Progress Notes (Signed)
Subjective:     Patient is a 75 y.o. female who presents for initial postop evaluation s/p R TCAR.  She reports feeling well at home.  No symptoms of stroke or TIA.  Incisions healed well.  She remains on plavix without any complications.  Anxious about proceeding with revascularization of contralateral side.    Duplex today:  History:  Post Right TCAR, 1st post op  Prior studies: 06/07/20    Right Internal Carotid Artery (ICA)  Carotid artery stent appears patent with no significant stenosis (0-19%) documented   Right vertebral artery is antegrade    Left Internal Carotid Artery (ICA)  Moderate stenosis (50-79%) stable. ICA/CCA ratio of 4.29  Left vertebral artery is antegrade      Review of Systems  A comprehensive review of systems was negative.    Objective:     Physical Exam:  NAD  RRR  Respirations non labored  R clavicular incision well healed    Pulses:  Pulses:     Palpable B radial pulses     Vitals:    10/18/20 1000   BP: 102/72   Weight:    Height:      Current Outpatient Medications   Medication    albuterol HFA (PROVENTIL, VENTOLIN, PROAIR HFA) 108 (90 Base) MCG/ACT inhaler    aspirin (ASPIRIN EC) 81 mg EC tablet    Cetirizine HCl (ZYRTEC PO)    atorvastatin (LIPITOR) 80 mg tablet    fluticasone-umeclidinium-vilanterol (TRELEGY ELLIPTA) 200-62.5-25 MCG/INH diskus inhaler    gabapentin (GABAPENTIN) 300 mg capsule    clopidogrel (PLAVIX) 75 mg tablet    sertraline (ZOLOFT) 100 mg tablet    montelukast (SINGULAIR) 10 mg tablet    docusate sodium (COLACE) 100 MG capsule    polyethylene glycol (GLYCOLAX,MIRALAX) 17 g powder packet    acetaminophen (TYLENOL) 500 mg tablet    ibuprofen (ADVIL,MOTRIN) 600 MG tablet    azelastine (ASTELIN) 0.1 % nasal spray    fluticasone (FLONASE) 50 MCG/ACT nasal spray    Non-System Medication    ipratropium-albuterol (DUONEB) 0.5-2.5mg  /35mL nebulizer solution    Misc. Devices (CPAP) machine    Cholecalciferol (VITAMIN D3) 5000 UNITS TABS    pantoprazole  (PROTONIX) 40 MG EC tablet    ARTIFICIAL TEARS (LUBRIFRESH PM,GENTEAL PM) ointment     No current facility-administered medications for this visit.     Allergies   Allergen Reactions    Morphine Hives     Has tolerated Percocet      Past Medical History:   Diagnosis Date    Allergic rhinitis 09/30/2014    Aortic stenosis 04/14/2017    Moderate     Arthritis     Asthma     CAD (coronary artery disease)     Carotid stenosis, bilateral 10/31/2019    6/21- incidental finding during hospitalization, >70% 9/21- Severe, surgery delayed due to odontoid fracture winter 2021 1/22- severe, recommending   cardiac  clearance and then CEA vs TCAR    Chronic obstructive pulmonary disease 06/30/2013    PFTs 2/19: FEV1/FVC 0.65, FEV1 74% 3/21:Fev1/fvc 0.65, FEV1 64%  FEV1 has decreased by 16% and FVC has decreased by 10% . Transitioned to trelegy  12/21- Fev1/FVC 67 ; FEV1 73% The values have increased compared to previous testing in March 2021. The FEV1 and FVC increased by 14%  and 10% respectively.    DVT (deep venous thrombosis)     post-op after CABG, on anticoagulation for ~2 years    PE (pulmonary  embolism)     post-op after CABG 2013, repeat 12/2019     Prediabetes     Severe Obstructive sleep apnea 11/11/2013    Adherent with CPAP    Tubular adenoma 03/2015    colonoscopy       Past Surgical History:   Procedure Laterality Date    ACHILLES TENDON SURGERY      APPENDECTOMY      CAROTID ARTERY ANGIOPLASTY Right 09/04/2020    CHOLECYSTECTOMY      CORONARY ANGIOPLASTY WITH STENT PLACEMENT  2012    CORONARY ARTERY BYPASS GRAFT  2013    triple     CYST REMOVAL      both thumbs    PR REPAIR EYE BLOWOUT,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    Procedure: ORBIT ORIF;  Surgeon: Jannett Celestine, DDS;  Location: [facility] MAIN OR;  Service: OMFS    TONSILLECTOMY AND ADENOIDECTOMY        Social History     Tobacco Use    Smoking status: Never Smoker    Smokeless tobacco: Never Used   Substance Use Topics    Alcohol use: Not  Currently     Comment: rare      Family History   Problem Relation Age of Onset    Other Mother         "rare lung disease"    Aneurysm Mother         abdominal aortic aneurysm    Diabetes Type II Mother     Breast cancer Mother     Blood clots Mother     Stroke Father     Stroke Brother         x2    Thyroid cancer Daughter     Anesthesia problems Neg Hx         Assessment:     Assessment   Patient Active Problem List    Diagnosis Date Noted    Pulmonary nodule 09/30/2014     Priority: High     CT 02/20/15:  Stable left lower lung 6 mm nodule.     Repeat 12/18 stable, no furhter imaging indicated      Prediabetes 09/30/2014     Priority: Medium    Morbid obesity 01/12/2014     Priority: Medium    Severe Obstructive sleep apnea 11/11/2013     Priority: Medium     Adherent with CPAP      Coronary atherosclerosis 03/27/2013     Priority: Medium     Coronary artery disease   CABG 2013.    PCI to LAD in 8/13.    Coronary angiogram February of 2015 at [facility] after she had chest pain and an abnormal stress test that revealed patent stent and 3/3 grafts patent. The ejection fraction was 60%.    Episode of atypical chest pain 04/2015, negative troponins, negative nuclear stress test.         Gastritis 09/30/2014     Priority: Low    Sinus bradycardia 09/06/2020     HR 30-40s post op from R TCAR on 09/04/20  Cardiology consulted inpatient  Discharged with e-patch monitor and follow up with patient's cardiologist- Dr Carlena Bjornstad 09/04/2020 07/28/2020     Added automatically from request for surgery [id number]    Subtle R facial droop post operatively   No other focal neuro deficits  HR 30-40 post op, e-patch monitor x 48h post op per cardiology  R chest incision dermabond, L groin venous access CDI  ASA, plavix, Lipitor  Follow up in 1 month with [place] and office visit with Dr Lissa Merlin      Adnexal cyst 07/17/2020     1/22 CT- LEFT sided (per body of report, impression says RIGHT  however)  6/22 [place] confirms LEFT simple cyst, 1.6cm      Endometrial thickening on CT 10/31/2019     6/21- incidental finding during hospitalization  11/21- EMB nondiagnostic but atrophic fragments      Carotid stenosis, bilateral 10/31/2019     6/21- incidental finding during hospitalization, >70%  9/21- Severe, surgery delayed due to odontoid fracture winter 2021  1/22- severe, recommending   cardiac  clearance and then CEA vs TCAR      Closed odontoid fracture with type III morphology and routine healing, subsequent encounter 10/25/2019    Environmental allergies 07/24/2018     Immunotherapy ~2010      Osteopenia 04/03/2018     DXA 10/2016 AP spine -0.2, L neck 1.1, R -1.3, total L -1.0, total R -0.9  05/2018 AP spine -0.2, L neck -2.1, total -1.3      Anxiety and depression 06/12/2017    Aortic stenosis 04/14/2017     Mild on echo 3/21, repeat in 67yr  Mod/severe on echo 12/2019    Moderate again 04/2020  Getting nuclear stress echo      Health care maintenance 01/01/2017     PCN allergy removed after negative allergy testing.       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 Transitioned to trelegy    12/21- Fev1/FVC 67 ; FEV1 73%  The values have increased compared to previous testing in March 2021. The FEV1 and FVC increased by 14%  and 10% respectively.      Tachycardia 06/30/2013    DVT (deep venous thrombosis), right 04/12/2013    Shortness of breath 04/12/2013    Pulmonary embolism 04/12/2013     12/2019      Asthma 03/27/2013     11/21 Spirometry today reveals an FEV1 of 1.66 liters (73% of predicted), an FVC of  2.46 liters (83 % of predicted) and a normal FEV1/FVC ratio of 67 %. Diffusing capacity is 20.34 ml/min/mmHg or  98% of predicted. These values are consistent with normal spirometry (since trelegy added). The values have increased compared to previous testing in March 2021. The FEV1 and FVC increased by 14%  and 10% respectively.      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 Carotid stenosis, bilateral  CV US Carotid Bilateral             Plan:     Plan     Doing well s/p TCAR.  Already on lifelong plavix for prior coronary intervention.  Duplex today with moderate stenosis on L carotid.  Will reimage in 6 months. No need for revascularization at this time.    Randolm Idol, MD 10/18/2020

## 2020-11-16 ENCOUNTER — Encounter: Payer: Self-pay | Admitting: Primary Care

## 2020-11-16 ENCOUNTER — Ambulatory Visit: Payer: Medicare (Managed Care) | Admitting: Primary Care

## 2020-11-16 ENCOUNTER — Telehealth: Payer: Self-pay

## 2020-11-16 VITALS — BP 128/74 | HR 89 | Ht 67.01 in | Wt 242.0 lb

## 2020-11-16 DIAGNOSIS — I350 Nonrheumatic aortic (valve) stenosis: Secondary | ICD-10-CM

## 2020-11-16 DIAGNOSIS — J449 Chronic obstructive pulmonary disease, unspecified: Secondary | ICD-10-CM

## 2020-11-16 DIAGNOSIS — R1310 Dysphagia, unspecified: Secondary | ICD-10-CM

## 2020-11-16 DIAGNOSIS — Z1211 Encounter for screening for malignant neoplasm of colon: Secondary | ICD-10-CM

## 2020-11-16 NOTE — Telephone Encounter (Signed)
The patient was referred today, 11/16/2020, for Gastroenterology. The patient has a preferred provider but the provider has moved out of network since the last time the patient saw the provider. The patient is undecided on where she would like to go for the referral. The patient stated she will contact the office when she makes a decision.    When the patient calls back, please authorize the patient's referral under the provider of her choice.

## 2020-11-16 NOTE — Progress Notes (Signed)
[facility]          SUBJECTIVE    Pt is here to discuss:    Chief Complaint   Patient presents with    Chest Pain     patient here for follow up         1. FU chest pain, exertional DOE- still has infrequent chest pain, nonexertional. Seen by cardiology in May who recommended repeating echo again in 57mo. Also seen by pulmonology in June, had some worsening of PFTs. Encouraged weight loss/exercise. Recently returned from trip to [place]; was able to do some hiking without severe limitations     2. Dysphagia- to pills. Struggled this AM with a pill in her throat for a few minutes. Recalls EGD with stretching procedure years ago. Overdue for colon cancer screening too.    PMH / Family Hx / Social Hx  Patient's medications, allergies, problem list, past medical, social histories were reviewed and notable for:    Current Outpatient Medications   Medication Sig    aspirin (ASPIRIN EC) 81 mg EC tablet Take 81 mg by mouth daily    Cetirizine HCl (ZYRTEC PO) Take 2 tablets by mouth daily    atorvastatin (LIPITOR) 80 mg tablet Take 1 tablet (80 mg total) by mouth daily  Replacing 40mg  dose; please cancel refills    fluticasone-umeclidinium-vilanterol (TRELEGY ELLIPTA) 200-62.5-25 MCG/INH diskus inhaler Inhale 1 puff into the lungs daily    gabapentin (GABAPENTIN) 300 mg capsule Take 1 capsule (300 mg total) by mouth 3 times daily    clopidogrel (PLAVIX) 75 mg tablet Take 75 mg by mouth daily      sertraline (ZOLOFT) 100 mg tablet Take 1 tablet (100 mg total) by mouth daily  Replacing 50mg  dose, please cancel other refills    montelukast (SINGULAIR) 10 mg tablet Take 1 tablet (10 mg total) by mouth nightly    docusate sodium (COLACE) 100 MG capsule Take 100 mg by mouth 2 times daily    polyethylene glycol (GLYCOLAX,MIRALAX) 17 g powder packet Take by mouth daily as needed    pantoprazole (PROTONIX) 40 MG EC tablet Take 1 tablet (40 mg total) by mouth daily (before breakfast)  Swallow whole. Do not crush,  break, or chew.    fluticasone (FLONASE) 50 MCG/ACT nasal spray 2 sprays by Nasal route daily    Cholecalciferol (VITAMIN D3) 5000 UNITS TABS Take 5,000 Units by mouth every morning       albuterol HFA (PROVENTIL, VENTOLIN, PROAIR HFA) 108 (90 Base) MCG/ACT inhaler Inhale 2 puffs into the lungs every 4-6 hours as needed  for Chronic Obstructive Lung Disease    acetaminophen (TYLENOL) 500 mg tablet Take 2 tablets (1,000 mg total) by mouth 3 times daily (Patient taking differently: Take 1,000 mg by mouth as needed  )    ibuprofen (ADVIL,MOTRIN) 600 MG tablet Take 1 tablet (600 mg total) by mouth 3 times daily as needed for Pain    ARTIFICIAL TEARS (LUBRIFRESH PM,GENTEAL PM) ointment Please administer every 6 hours to both eyes (Patient not taking: Reported on 08/25/2020)    azelastine (ASTELIN) 0.1 % nasal spray 1-2 sprays by Nasal route 2 times daily    Non-System Medication Medication/Supply: R thumb spica brace  Directions for Use: wear nightly    ipratropium-albuterol (DUONEB) 0.5-2.5mg  /45mL nebulizer solution Take 3 mLs by nebulization every 6 hours    Misc. Devices (CPAP) machine Download and compliance report          OBJECTIVE  Vitals:  11/16/20 1309   BP: 128/74   BP Location: Right arm   Patient Position: Sitting   Pulse: 89   SpO2: 96%   Weight: 109.8 kg (242 lb)   Height: 1.702 m (5' 7.01")     Body mass index is 37.89 kg/m.      General: well-appearing Caucasian female, pleasant & conversant, in NAD     Psych: AAOx3, normal affect and mood. Insight and judgement intact.           ASSESSMENT & PLAN  1. Chronic obstructive pulmonary disease, unspecified COPD type  2. Aortic valve stenosis, etiology of cardiac valve disease unspecified   Appears stable; no worsening of DOE nor exertional chest pain. Continue current regimen and scheduled follow up with specialists.     3. Dysphagia, unspecified type  4. Screening for colon cancer    - AMB REFERRAL TO GASTROENTEROLOGY      Follow-up: as scheduled  in 37mo for AWV, sooner prn    [name], MD  [place]  11/16/2020  1:17 PM        ______________________

## 2020-11-20 ENCOUNTER — Encounter: Payer: Self-pay | Admitting: Gastroenterology

## 2020-11-27 ENCOUNTER — Encounter: Payer: Self-pay | Admitting: Gastroenterology

## 2020-12-06 NOTE — Telephone Encounter (Signed)
I called and spoke with patient. She said she found a Copy[employer], [employer] but she has to see if they take her insurance. She will call back with the information.

## 2020-12-13 ENCOUNTER — Other Ambulatory Visit: Payer: Self-pay | Admitting: Pulmonology

## 2020-12-14 MED ORDER — FLUTICASONE-UMECLIDIN-VILANT 200-62.5-25 MCG/INH IN AEPB *I*
1.0000 | INHALATION_SPRAY | Freq: Every day | RESPIRATORY_TRACT | 5 refills | Status: AC
Start: 2020-12-14 — End: ?

## 2021-01-22 ENCOUNTER — Other Ambulatory Visit: Payer: Self-pay | Admitting: Primary Care

## 2021-01-22 NOTE — Telephone Encounter (Signed)
Last office visit:   11/16/2020  Patients upcoming appointments:  Future Appointments   Date Time Provider [place]   04/18/2021 12:30 PM VASCULAR SURGERY, TECH 1 VSP CV Vasc   04/18/2021  1:15 PM Randolm Idol, MD VSS None   05/29/2021  9:45 AM RIS, RR US2 (RRUS2) ERU ERiver IMG   05/30/2021  1:45 PM MED [facility] PULMONARY LAB 3 MPU [place] PULMONAR   05/30/2021  2:00 PM Petronaci, Sharene Butters, MD MPU [facility] PULMONAR   06/14/2021 10:45 AM Ralene Bathe, MD UPR None   07/23/2021  1:00 PM [name], MD CAF None       Recent Lab Values 08/02/2020 01/06/2020   EXP DATE 03/12/2021 02/09/2021       Recent Lab results:  GENERAL CHEMISTRY   Recent Labs     07/17/20  1023 02/09/20  1156   NA 143 143   K 4.6 4.8   CL 103 104   CO2 30* 30*   GAP 10 9   UN 16 19   CREAT 0.72 0.78   GFRC  --  75   GFRB  --  87   GLU 92 102*   CA 9.8 10.1      LIPID PROFILE   Recent Labs     07/17/20  1023   CHOL 162   TRIG 143   HDL 43   LDLC 90      LIVER PROFILE   Recent Labs     07/17/20  1023   ALT 22   AST 31   ALK 130*   TB 0.4      DIABETES THYROID   Recent Labs     08/25/20  1328 07/17/20  1023   HA1C 6.1* 6.1*    No value within the past 365 days      Pending/Orders Labs:  Lab Frequency Next Occurrence   US renal retroperitoneal complete Once 06/15/2021   CV US Carotid Bilateral Once 10/05/2020   PULMONARY FUNCTION TEST Spirometry Once 03/30/2021   CV US Carotid Bilateral Once 04/19/2021

## 2021-02-24 ENCOUNTER — Other Ambulatory Visit: Payer: Self-pay | Admitting: Family Medicine

## 2021-02-24 DIAGNOSIS — F32A Depression, unspecified: Secondary | ICD-10-CM

## 2021-02-24 DIAGNOSIS — F419 Anxiety disorder, unspecified: Secondary | ICD-10-CM

## 2021-02-26 NOTE — Telephone Encounter (Signed)
Last office visit:   11/16/2020  Patients upcoming appointments:  Future Appointments   Date Time Provider [place]   04/18/2021 12:30 PM VASCULAR SURGERY, TECH 1 VSP CV Vasc   04/18/2021  1:15 PM Randolm Idol, MD VSS None   05/29/2021  9:45 AM RIS, RR US2 (RRUS2) ERU ERiver IMG   05/30/2021  1:45 PM MED [facility] PULMONARY LAB 3 MPU [place] PULMONAR   05/30/2021  2:00 PM Petronaci, Sharene Butters, MD MPU [facility] PULMONAR   06/14/2021 10:45 AM Ralene Bathe, MD UPR None   07/23/2021  1:00 PM [name], MD CAF None       Recent Lab Values 08/02/2020 01/06/2020   EXP DATE 03/12/2021 02/09/2021       Recent Lab results:  GENERAL CHEMISTRY   Recent Labs     07/17/20  1023   NA 143   K 4.6   CL 103   CO2 30*   GAP 10   UN 16   CREAT 0.72   GLU 92   CA 9.8      LIPID PROFILE   Recent Labs     07/17/20  1023   CHOL 162   TRIG 143   HDL 43   LDLC 90      LIVER PROFILE   Recent Labs     07/17/20  1023   ALT 22   AST 31   ALK 130*   TB 0.4      DIABETES THYROID   Recent Labs     08/25/20  1328 07/17/20  1023   HA1C 6.1* 6.1*    No value within the past 365 days      Pending/Orders Labs:  Lab Frequency Next Occurrence   US renal retroperitoneal complete Once 06/15/2021   CV US Carotid Bilateral Once 10/05/2020   PULMONARY FUNCTION TEST Spirometry Once 03/30/2021   CV US Carotid Bilateral Once 04/19/2021

## 2021-02-27 ENCOUNTER — Other Ambulatory Visit: Payer: Self-pay | Admitting: Primary Care

## 2021-02-27 DIAGNOSIS — R0600 Dyspnea, unspecified: Secondary | ICD-10-CM

## 2021-02-27 NOTE — Telephone Encounter (Signed)
Last office visit:   11/16/2020  Patients upcoming appointments:  Future Appointments   Date Time Provider [place]   03/06/2021  8:40 AM [name], MD CAF None   04/18/2021 12:30 PM VASCULAR SURGERY, TECH 1 VSP CV Vasc   04/18/2021  1:15 PM Randolm Idol, MD VSS None   05/29/2021  9:45 AM RIS, RR US2 (RRUS2) ERU ERiver IMG   05/30/2021  1:45 PM MED [facility] PULMONARY LAB 3 MPU [place] PULMONAR   05/30/2021  2:00 PM Petronaci, Sharene Butters, MD MPU [facility] PULMONAR   06/14/2021 10:45 AM Ralene Bathe, MD UPR None   07/23/2021  1:00 PM [name], MD CAF None       Recent Lab Values 08/02/2020 01/06/2020   EXP DATE 03/12/2021 02/09/2021       Recent Lab results:  GENERAL CHEMISTRY   Recent Labs     07/17/20  1023   NA 143   K 4.6   CL 103   CO2 30*   GAP 10   UN 16   CREAT 0.72   GLU 92   CA 9.8      LIPID PROFILE   Recent Labs     07/17/20  1023   CHOL 162   TRIG 143   HDL 43   LDLC 90      LIVER PROFILE   Recent Labs     07/17/20  1023   ALT 22   AST 31   ALK 130*   TB 0.4      DIABETES THYROID   Recent Labs     08/25/20  1328 07/17/20  1023   HA1C 6.1* 6.1*    No value within the past 365 days      Pending/Orders Labs:  Lab Frequency Next Occurrence   US renal retroperitoneal complete Once 06/15/2021   CV US Carotid Bilateral Once 10/05/2020   PULMONARY FUNCTION TEST Spirometry Once 03/30/2021   CV US Carotid Bilateral Once 04/19/2021

## 2021-03-06 ENCOUNTER — Ambulatory Visit: Payer: Medicare (Managed Care) | Attending: Primary Care | Admitting: Primary Care

## 2021-03-06 ENCOUNTER — Ambulatory Visit: Payer: Medicare (Managed Care) | Admitting: Primary Care

## 2021-03-06 ENCOUNTER — Other Ambulatory Visit: Payer: Self-pay

## 2021-03-06 ENCOUNTER — Encounter: Payer: Self-pay | Admitting: Primary Care

## 2021-03-06 VITALS — BP 138/68 | HR 90 | Ht 67.01 in | Wt 241.0 lb

## 2021-03-06 DIAGNOSIS — J189 Pneumonia, unspecified organism: Secondary | ICD-10-CM | POA: Insufficient documentation

## 2021-03-06 DIAGNOSIS — M65332 Trigger finger, left middle finger: Secondary | ICD-10-CM | POA: Insufficient documentation

## 2021-03-06 DIAGNOSIS — Z23 Encounter for immunization: Secondary | ICD-10-CM | POA: Insufficient documentation

## 2021-03-06 MED ORDER — ALBUTEROL SULFATE HFA 108 (90 BASE) MCG/ACT IN AERS *I*
2.0000 | INHALATION_SPRAY | RESPIRATORY_TRACT | 11 refills | Status: AC | PRN
Start: 2021-03-06 — End: ?

## 2021-03-06 MED ORDER — GABAPENTIN 300 MG PO CAPSULE *I*
300.0000 mg | ORAL_CAPSULE | Freq: Three times a day (TID) | ORAL | 2 refills | Status: AC
Start: 2021-03-06 — End: ?

## 2021-03-06 MED ORDER — DICLOFENAC SODIUM 1 % EX GEL *I*
2.0000 g | Freq: Four times a day (QID) | CUTANEOUS | 0 refills | Status: AC
Start: 2021-03-06 — End: ?

## 2021-03-06 NOTE — Progress Notes (Signed)
[facility]            SUBJECTIVE    Pt is here to discuss:    Chief Complaint   Patient presents with    Pneumonia         1. FU ED visit in [place] - while visiting son developed cough and seen on 9/17 at [facility]; treated as sinusitis, given prednisone x5d. When symptoms didn't improve and she became SOB on 9/21, returned to [facility] for reevaluation. CXR was concerning for pneumonia in L upper lung zone, treated with amoxicillin x10d and IM Rocephin.  Tested neg for flu and COVID    Then developed excessive bleeding from injection site, required repeat eval in ED and treated with surgicel.     Completed prednisone x5d and antibiotics x10d    Finally feeling better, ~75%, but admits this didn't occur until after she finished abx.   Still has lingering cough, managing it with albuterol.   Asking for albuterol refill    She tells me today she will be moving back permanently to [place] in 1 month (thanksiving time)    2. Finger pain - L middle finger, gets stuck in flexion every morning. Has to pop it back open. Quite painful. New for the last few weeks    PMH / Family Hx / Social Hx  Patient's medications, allergies, problem list, past medical, social histories were reviewed and notable for:    Current Outpatient Medications   Medication Sig    montelukast (SINGULAIR) 10 mg tablet TAKE 1 TABLET BY MOUTH NIGHTLY    sertraline (ZOLOFT) 100 mg tablet TAKE 1 TABLET BY MOUTH EVERY DAY - REPLACING 50 MG DOSE    pantoprazole (PROTONIX) 40 mg EC tablet TAKE 1 TABLET BY MOUTH EVERY DAY BEFORE BREAKFAST SWALLOW WHOLE DO NOT CRUSH, BREAK, OR CHEW    fluticasone-umeclidinium-vilanterol (TRELEGY ELLIPTA) 200-62.5-25 MCG/INH diskus inhaler Inhale 1 puff into the lungs daily    aspirin 81 mg EC tablet Take 81 mg by mouth daily    Cetirizine HCl (ZYRTEC PO) Take 2 tablets by mouth daily    atorvastatin (LIPITOR) 80 mg tablet Take 1 tablet (80 mg total) by mouth daily  Replacing 40mg  dose; please cancel refills    gabapentin  (GABAPENTIN) 300 mg capsule Take 1 capsule (300 mg total) by mouth 3 times daily    clopidogrel (PLAVIX) 75 mg tablet Take 75 mg by mouth daily      docusate sodium (COLACE) 100 MG capsule Take 100 mg by mouth 2 times daily    polyethylene glycol (GLYCOLAX,MIRALAX) 17 g powder packet Take by mouth daily as needed    azelastine (ASTELIN) 0.1 % nasal spray 1-2 sprays by Nasal route 2 times daily    fluticasone (FLONASE) 50 MCG/ACT nasal spray 2 sprays by Nasal route daily    Cholecalciferol (VITAMIN D3) 5000 UNITS TABS Take 5,000 Units by mouth every morning       albuterol HFA (PROVENTIL, VENTOLIN, PROAIR HFA) 108 (90 Base) MCG/ACT inhaler Inhale 2 puffs into the lungs every 4-6 hours as needed  for Chronic Obstructive Lung Disease    ibuprofen (ADVIL,MOTRIN) 600 MG tablet Take 1 tablet (600 mg total) by mouth 3 times daily as needed for Pain    Non-System Medication Medication/Supply: R thumb spica brace  Directions for Use: wear nightly    ipratropium-albuterol (DUONEB) 0.5-2.5mg  /53mL nebulizer solution Take 3 mLs by nebulization every 6 hours    Misc. Devices (CPAP) machine Download and compliance  report        CXR 9/21 reviewed:    1. Prior CABG.     2. No evidence of acute cardiopulmonary process.     3. Hazy nodular densities over the left upper lung field, vertical distribution conforming to multiple left posterior ribs would support callous but cannot confirm.     If chest CT is not otherwise required for patient evaluation, recommend left rib series. Otherwise, chest CT would be definitive.        OBJECTIVE  Vitals:    03/06/21 1057   BP: 138/68   BP Location: Left arm   Patient Position: Sitting   Cuff Size: large adult   Pulse: 90   SpO2: 98%   Weight: 109.3 kg (241 lb)   Height: 1.702 m (5' 7.01")     Body mass index is 37.74 kg/m.      General: well-appearing Caucasian female, pleasant & conversant, in NAD   Lungs: Normal resp effort. CTAB.  No crackles or wheezes.  Cardiac: RRR, +systolic  crescendo murmur loudest at RUSB. No pedal edema.   MSK: +flexion deformity at L middle finger. TTP at flexor tendon sheath at distal palmar crease  Psych: AAOx3, normal affect and mood. Insight and judgement intact.           ASSESSMENT & PLAN  1. Pneumonia  UC notes x2 and ED note and imaging reviewed by me today through care everywhere  L upper lung pneumonia  Reviewed previous CT chests between 2016-2021 showed stable L lower zone nodule. Encouraged repeat imaging in 59mo (January) to assure interval clearance of new L upper lung findings. Printout of CXR and last CT chest provided to patient today as she will have already moved to [facility] in January      2. Trigger finger, left middle finger  Encouraged topical voltaren gel and tendon gliding exercises (handout provided)  F/u 2 weeks for local steroid injection if no interval improvememnt  - diclofenac (VOLTAREN) 1 % gel; Apply 2 g topically 4 times daily  to the following areas: L middle finger  Dispense: 100 g; Refill: 0    3. Immunization due       Pt denies current illness/fever, allergy to egg or flu vaccine component, h/o previous serious reaction to the flu vaccine, h/o Guillain-Barre. I have educated the patient and/or parent/guardian/designee about each component in all the immunizations and toxoids that are being given today, have reviewed potential vaccine side effects and have answered their questions about the vaccinations.      Discussed current data shows decreased COVID vaccine effectiveness at 77months and current risk of breakthrough infection. Reviewed the new COVID booster mechanism of action/minor differences, current availability and encouraged this prior to anticipated fall /winter surge. Encouraged prompt testing for any symptoms and reviewed availability of current antiviral treatments that can reduce risk of severe illness.     - Flu quad high dose >65 YO (high dose)  - COVID-19 MRNA Bivalent Vaccine Booster Pfizer-[employer]ion[employer] 30 mcg/0.61mL IM  SUSP      Follow-up: 2 weeks for trigger finger injection      [name], MD  [place]  03/06/2021  11:06 AM        ______________________

## 2021-03-20 ENCOUNTER — Ambulatory Visit: Payer: Medicare (Managed Care) | Admitting: Primary Care

## 2021-03-23 ENCOUNTER — Other Ambulatory Visit: Payer: Self-pay

## 2021-03-23 ENCOUNTER — Encounter: Payer: Self-pay | Admitting: Primary Care

## 2021-03-23 ENCOUNTER — Ambulatory Visit: Payer: Medicare (Managed Care) | Admitting: Primary Care

## 2021-03-23 VITALS — BP 104/70 | HR 91 | Temp 96.7°F | Ht 67.01 in | Wt 235.2 lb

## 2021-03-23 DIAGNOSIS — M65332 Trigger finger, left middle finger: Secondary | ICD-10-CM

## 2021-03-23 MED ORDER — TRIAMCINOLONE ACETONIDE 40 MG/ML IJ SUSP *I*
10.0000 mg | Freq: Once | INTRAMUSCULAR | Status: AC
Start: 2021-03-23 — End: 2021-03-23
  Administered 2021-03-23: 10 mg

## 2021-03-23 NOTE — Procedures (Signed)
Trigger finger Injection Procedure Note    Pre-operative Diagnosis: L middle trigger finger    Post-operative Diagnosis: same    Indications: pain and recurrent catching sensation, occasional locking that requires him to manually extend finger      Procedure Details     Written consent was obtained for the procedure. The flexor tendon and A1 pulley of the L middle digit were identified.  A palpable swelling within the tendon and mild catching sensation were perceived proximal to the A1 pulley. There was no evidence of locking today. . The joint was prepped with Hibaclens and a TB needle was inserted into the flexor tendon sheath and position was verified by asking pt to flex the finger and observing the needle move. A total of 0.55mL of 1% lidocaine WITHOUT epinephrine and 0.24ml of Kenalog 40mg /mL were injected into the sheath. The needle was removed and the area cleansed and dressed with bandaid.    Complications:  None; patient tolerated the procedure well.    Plan:  1) reviewed warning signs of infection  2) recommended to keep area covered with bandaid for remainder of today  3) recommended avoiding soaking hands for 24h  4) f/u prn    [name], MD  [place]  08/06/2013  10:35 AM

## 2021-03-28 ENCOUNTER — Encounter: Payer: Self-pay | Admitting: Gastroenterology

## 2021-03-29 ENCOUNTER — Encounter: Payer: Self-pay | Admitting: Primary Care

## 2021-04-18 ENCOUNTER — Other Ambulatory Visit: Payer: Self-pay

## 2021-04-18 ENCOUNTER — Encounter: Payer: Self-pay | Admitting: Vascular Surgery

## 2021-04-18 ENCOUNTER — Ambulatory Visit: Payer: Medicare (Managed Care) | Attending: Surgery | Admitting: Vascular Surgery

## 2021-04-18 ENCOUNTER — Ambulatory Visit
Admission: RE | Admit: 2021-04-18 | Discharge: 2021-04-18 | Disposition: A | Discharge status: Home / Self Care | Payer: Medicare (Managed Care) | Source: Ambulatory Visit

## 2021-04-18 VITALS — BP 120/70 | Ht 67.01 in | Wt 234.0 lb

## 2021-04-18 DIAGNOSIS — I6523 Occlusion and stenosis of bilateral carotid arteries: Secondary | ICD-10-CM

## 2021-04-18 NOTE — Progress Notes (Signed)
Vascular Surgery Outpatient Clinic Follow-Up Visit    HPI:     [name] is a 75 y.o. female with PMH significant for CAD s/p CABG, history of DVT/PE following CABG, who is seen in the Vascular Surgery outpatient clinic for follow up of carotid artery stenosis. [name] is s/p R TCAR on 09/04/2020 with Dr. Lissa Merlin for severe carotid artery stenosis.      [name] reports doing well since last seen. She denies any signs or symptoms of stroke or TIA including amaurosis fugax, incoherent speech, facial droop, or unilateral weakness/paralysis.  She remains on antiplatelet and statin therapy.      Patient's medications, allergies, and medical, surgical, family, and social histories were updated, as appropriate, in eRecord during today's office visit.    REVIEW OF SYSTEMS  ROS - Pertinent items noted in HPI.    MEDICAL HISTORY  Past Medical History:   Diagnosis Date    Allergic rhinitis 09/30/2014    Aortic stenosis 04/14/2017    Moderate     Arthritis     Asthma     CAD (coronary artery disease)     Carotid stenosis, bilateral 10/31/2019    6/21- incidental finding during hospitalization, >70% 9/21- Severe, surgery delayed due to odontoid fracture winter 2021 1/22- severe, recommending   cardiac  clearance and then CEA vs TCAR    Chronic obstructive pulmonary disease 06/30/2013    PFTs 2/19: FEV1/FVC 0.65, FEV1 74% 3/21:Fev1/fvc 0.65, FEV1 64%  FEV1 has decreased by 16% and FVC has decreased by 10% . Transitioned to trelegy  12/21- Fev1/FVC 67 ; FEV1 73% The values have increased compared to previous testing in March 2021. The FEV1 and FVC increased by 14%  and 10% respectively.    DVT (deep venous thrombosis)     post-op after CABG, on anticoagulation for ~2 years    PE (pulmonary embolism)     post-op after CABG 2013, repeat 12/2019     Prediabetes     Severe Obstructive sleep apnea 11/11/2013    Adherent with CPAP    Tubular adenoma 03/2015    colonoscopy        SURGICAL HISTORY  Past Surgical History:    Procedure Laterality Date    ACHILLES TENDON SURGERY      APPENDECTOMY      CAROTID ARTERY ANGIOPLASTY Right 09/04/2020    CHOLECYSTECTOMY      CORONARY ANGIOPLASTY WITH STENT PLACEMENT  2012    CORONARY ARTERY BYPASS GRAFT  2013    triple     CYST REMOVAL      both thumbs    PR REPAIR EYE BLOWOUT,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 She has never used smokeless tobacco. She reports that she does not currently use alcohol. She reports that she does not use drugs.     ALLERGIES  Morphine     MEDICATIONS  Current Outpatient Medications   Medication Sig    gabapentin 300 mg capsule Take 1 capsule (300 mg total) by mouth 3 times daily    diclofenac (VOLTAREN) 1 % gel Apply 2 g topically 4 times daily  to the following areas: L middle finger    montelukast (SINGULAIR) 10 mg tablet TAKE 1 TABLET BY MOUTH NIGHTLY    sertraline (ZOLOFT) 100 mg tablet TAKE 1 TABLET BY MOUTH EVERY DAY - REPLACING 50 MG DOSE    pantoprazole (PROTONIX) 40 mg EC tablet TAKE 1 TABLET BY MOUTH EVERY DAY BEFORE BREAKFAST SWALLOW WHOLE DO NOT CRUSH, BREAK, OR CHEW    fluticasone-umeclidinium-vilanterol (TRELEGY ELLIPTA) 200-62.5-25 MCG/INH diskus inhaler Inhale 1 puff into the lungs daily    aspirin 81 mg EC tablet Take 81 mg by mouth daily    Cetirizine HCl (ZYRTEC PO) Take 2 tablets by mouth daily    atorvastatin (LIPITOR) 80 mg tablet Take 1 tablet (80 mg total) by mouth daily  Replacing 40mg   dose; please cancel refills    clopidogrel (PLAVIX) 75 mg tablet Take 75 mg by mouth daily      docusate sodium (COLACE) 100 MG capsule Take 100 mg by mouth 2 times daily    azelastine (ASTELIN) 0.1 % nasal spray 1-2 sprays by Nasal route 2 times daily    fluticasone (FLONASE) 50 MCG/ACT nasal spray 2 sprays by Nasal route daily    Non-System Medication Medication/Supply: R thumb spica brace  Directions for Use: wear nightly    ipratropium-albuterol (DUONEB) 0.5-2.5mg  /64mL nebulizer solution Take 3 mLs by nebulization every 6 hours    Misc. Devices (CPAP) machine Download and compliance report    Cholecalciferol (VITAMIN D3) 5000 UNITS TABS Take 5,000 Units by mouth every morning       albuterol HFA (PROVENTIL, VENTOLIN, PROAIR HFA) 108 (90 Base) MCG/ACT inhaler Inhale 2 puffs into the lungs every 4-6 hours as needed  for Chronic Obstructive Lung Disease    polyethylene glycol (GLYCOLAX,MIRALAX) 17 g powder packet Take by mouth daily as needed    ibuprofen (ADVIL,MOTRIN) 600 MG tablet Take 1 tablet (600 mg total) by mouth 3 times daily as needed for Pain     No current facility-administered medications for this visit.      Objective:      BP 120/70 (BP Location: Left arm, Patient Position: Sitting, Cuff Size: large adult)    Ht 1.702 m (5' 7.01")    Wt 106.1 kg (234 lb)    LMP  (LMP Unknown)    BMI 36.64 kg/m      Imaging:  Interpretation Summary  History:  Right TCAR, Left ICA stenosis  Prior studies: 10/18/2020    Right Internal Carotid Artery (ICA)  Carotid artery stent appears patent without no significant stenosis (0-19%).  Right vertebral artery is antegrade    Left Internal Carotid Artery (ICA)  Moderate stenosis (50-79%)  Left vertebral artery is antegrade    Physical Exam:      General appearance: healthy, alert, active and no distress  HEENT: Normocephalic  CV: regular rate and rhythm and no carotid bruit  Lungs: CTA bilaterally, respirations unlabored  Abd: soft, non-tender  Extremities:  Bilateral lower extremities are warm and well perfused without any evidence of clubbing, cyanosis,  or limb ischemia. Intact DP and PT pulses. No evidence of venous ulceration, lipodermatosclerosis, or edema.   Neuro: normal without focal findings, mental status, speech normal, alert and oriented x3 and sensation grossly normal    Assessment:   Bette Brienza is a 75 y.o. female with history of R TCAR, L ICA with stable moderate stenosis.  Currently asymptomatic     Plan:     Carotid artery stenosis   I personally reviewed images of the carotid duplex which stable moderate L ICA stenosis.  R ICA with no significant stenosis s/p TCAR   Reviewed results of imaging with patient - as above   No indication for surgical intervention at this time    Recommend ongoing surveillance and medical management with antiplatelet and statin therapy.     Seek immediate medical attention for any signs or symptoms of stroke or TIA.      Patient notes that she will be relocating to [place].  No follow up will be scheduled at this time. Discussed need for her establish care with PCP and vascular surgery once she is settled.  They can then request records and imaging.     Carver Fila, NP  04/18/2021 at 4:23 PM

## 2021-04-19 LAB — CV US CAROTID BILATERAL
Left Carotid Bulb EDV: 13.88 cm/s
Left Carotid Bulb PSV: 57.53 cm/s
Left Common Carotid Artery EDV Dist: 9.52 cm/s
Left Common Carotid Artery EDV Prox: 11.52 cm/s
Left Common Carotid Artery PSV Dist: 54.04 cm/s
Left Common Carotid Artery PSV Prox: 90.51 cm/s
Left External Carotid Artery EDV: 19.63 cm/s
Left External Carotid Artery PSV: 132.63 cm/s
Left ICA/CCA Ratio: 2.32
Left Internal Carotid Artery EDV Dist: 27.83 cm/s
Left Internal Carotid Artery EDV Mid: 116.53 cm/s
Left Internal Carotid Artery EDV Prox: 42.6 cm/s
Left Internal Carotid Artery PSV Dist: 79.6 cm/s
Left Internal Carotid Artery PSV Mid: 323.08 cm/s
Left Internal Carotid Artery PSV Prox: 125.44 cm/s
Left Subclavian Artery EDV: 0 cm/s
Left Subclavian Artery PSV: 101.76 cm/s
Left Vertebral Artery EDV: 14.76 cm/s
Left Vertebral Artery PSV: 48.81 cm/s
Right Carotid Bulb EDV: 14.14 cm/s
Right Carotid Bulb PSV: 39.46 cm/s
Right Common Carotid Artery EDV Dist: 14.53 cm/s
Right Common Carotid Artery EDV Prox: 13.43 cm/s
Right Common Carotid Artery PSV Dist: 44.24 cm/s
Right Common Carotid Artery PSV Prox: 55.25 cm/s
Right External Carotid Artery EDV: 92.11 cm/s
Right External Carotid Artery PSV: 476.43 cm/s
Right ICA/CCA Ratio: 2.77
Right Internal Carotid Artery EDV Dist: 24.1 cm/s
Right Internal Carotid Artery EDV Mid: 17.11 cm/s
Right Internal Carotid Artery EDV Prox: 41.43 cm/s
Right Internal Carotid Artery PSV Dist: 59.32 cm/s
Right Internal Carotid Artery PSV Mid: 72.85 cm/s
Right Internal Carotid Artery PSV Prox: 122.71 cm/s
Right Subclavian Artery EDV: 0 cm/s
Right Subclavian Artery PSV: 79.09 cm/s
Right Vertebral Artery EDV: 16.4 cm/s
Right Vertebral Artery PSV: 67.02 cm/s

## 2021-05-29 ENCOUNTER — Other Ambulatory Visit: Payer: Medicare (Managed Care)

## 2021-05-29 ENCOUNTER — Telehealth: Payer: Self-pay

## 2021-05-29 NOTE — Telephone Encounter (Signed)
Patient calls and states she has moved and needs to send her records to her new provider. Per the patient's request, a blank release of record has been uploaded to [employer]

## 2021-05-30 ENCOUNTER — Other Ambulatory Visit: Payer: Medicare (Managed Care)

## 2021-05-30 ENCOUNTER — Ambulatory Visit: Payer: Medicare (Managed Care) | Admitting: Pulmonology

## 2021-06-04 NOTE — Telephone Encounter (Signed)
The patient sent a request to release records to [facility]. The release was sent to [facility] for processing.

## 2021-06-14 ENCOUNTER — Ambulatory Visit: Payer: Medicare (Managed Care) | Admitting: Urology

## 2021-07-03 NOTE — Telephone Encounter (Signed)
[name] has released the records on 06/21/21. The original release has been placed in scanning.

## 2021-07-05 ENCOUNTER — Telehealth: Payer: Self-pay

## 2021-07-05 NOTE — Telephone Encounter (Signed)
Spoke to the patient to schedule a wellness appointment but patient states she no longer lives her. Patient asked we take DR. Moore off as a PCP.

## 2021-07-12 ENCOUNTER — Ambulatory Visit: Payer: Medicare (Managed Care) | Admitting: Urology

## 2021-07-23 ENCOUNTER — Ambulatory Visit: Payer: Medicare (Managed Care) | Admitting: Primary Care

## 2021-08-06 ENCOUNTER — Telehealth: Payer: Self-pay

## 2021-08-06 NOTE — Telephone Encounter (Unsigned)
Copied from [place] #[id number]. Topic: Information Request - Other  >> Aug 06, 2021  3:45 PM [name] wrote:  Patient [name] telephoned requesting that a Copy of the Pulmonary Function Test and the Office Visit Notes from August 09, 2019 [name]), March 29, 2020 [name]) and Sep 27, 2020 [name]) be sent via Facsimile to my Pulmonologist, Dr. Truman Hayward via Fax [phone]    If need be, Patient can be reached back at Phone [phone]

## 2021-08-07 NOTE — Telephone Encounter (Signed)
Writer faxed requested office notes and pulmonary function tests to Dr. Truman Hayward at [phone].

## 2021-08-26 ENCOUNTER — Other Ambulatory Visit: Payer: Self-pay | Admitting: Primary Care

## 2021-08-26 DIAGNOSIS — R0600 Dyspnea, unspecified: Secondary | ICD-10-CM

## 2021-12-19 NOTE — Discharge Summary (Signed)
[facility] DISCHARGE SUMMARY    PATENT IDENTIFICATION:  NAME: [name]  MRN: [id number]  DOB: [birth year]  PCP:  Docia Furl, MD  Primary Cardiologist: Dr. Lysbeth Penner    Admit date: 12/18/2021  Discharge date:  12/19/2021    Admitting Physician: Sibyl Parr, MD  Discharge Physician: Loraine Leriche, MD    Admission Diagnoses: Severe aortic stenosis  Discharge Diagnoses: Severe aortic stenosis s/p TAVR    Aortic Stenosis s/p TF TAVR using a 26mm Sapien valve on 12/18/2021; NYHA class III  - EKG: SINUS RHYTHM FIRST DEGREE AV BLOCK  (baseline)  - Post TAVR ECHO: completed, final report below. AV MG 16  - Continue Aspirin 81mg  qd for life  - L groin pursestring suture removed w/o complication   - Discharge teaching provided in detail and pt expressed understanding   - Discharge home today   - F/U in 30 days with ECHO prior to [facility] visit     Acute on Chronic Diastolic Heart Failure // NYHA III  - EF 60-65% per imaging  - BNP: 31  - LVEDP 27 in TAVR  - Volume status: appears euvolemic   - GDMT: N/A      Essential HTN:   - BP controlled  - resumed antihypertensives post procedure      CAD   - s/p CABG in 2013 and PCI to LAD  - follows with Dr. Katrinka Blazing as OP  - continue Statin, plavix & ASA     Dyslipidemia  - Statin     Carotid artery disease  - s/p TCAR 08/2020     PE  - occurred post CABG  - no longer on OAC      Asthma  - follows with Dr. Park Liter   - cont trelegy w/ singulair, zyrtec, and PRN albuterol   - most recent FEV1 is 73%     OSA  - cont trelegy     Obesity   - BMI 35.87  - lifestyle changes encouraged and advised      Admission Condition: Poor  Discharge Condition: Stable    Complications: no complications were noted   Infections:  none    Hospital Course: 76 year old female with past medical hx CAD (hx of CABG in 2013, PCI to LAD after surgery), HTN, HLD, carotid artery disease (hx of TCAR 08/2020), PE post CABG, asthma, OSA, and severe aortic stenosis who was admitted on 12/18/21 for planned TF TAVR  using a 26mm Sapien valve. There were no procedural complications.  Post op, she was admitted to the telemetry floor for monitoring, for which she remained stable. EKG post TAVR showed SR with 1st degree AVB. Bilateral groin sites remained soft and without oozing, L purestring was removed prior to discharge. She has ambulated in the hall without difficulty and is ready for discharge. Pt denies SOB at rest, DOE, orthopnea, dizziness, or edema.  Post TAVR echo was performed prior to discharge, read below. Follow up has been arranged with [facility] in 30 days with ECHO prior to visit.    Pertinent Diagnostic Studies:     TAVR Procedure Report (12/18/21):  COMPLETE OpNote  Surgeon(s) and Role:     * Loraine Leriche, MD - COSURGEON     * Sandria Bales, MD - Assisting     * Werner Lean, MD - COSURGEON     * Regan Rakers, MD - FELLOW     Staff and Assistants: CV Circulator: Roxine Caddy, RCIS  CV Scrub  Person: Elias Else, RCIS  CV Monitor Tech: Clide Dales     Pre-op Diagnosis:   SEVERE AORTIC STENOSIS  ACUTE ON CHRONIC DIASTOLIC CONGESTIVE HEART FAILURE  SEE EPIC PROBLEM LIST     Post-op Diagnosis:   SEVERE AORTIC STENOSIS  ACUTE ON CHRONIC DIASTOLIC CONGESTIVE HEART FAILURE  SEE EPIC PROBLEM LIST     Anesthesia Type: Monitored Anesthesia Care     Procedure(s) (LRB):  TAVR 26+2cc Sapien RTF and MAC (N/A)  IMPLANTATION, AORTIC VALVE, TRANSCATHETER, FEMORAL APPROACH (Bilateral)  ACCESS:  RFA  AORTOGRAM  SELECTIVE RFA ANGIOGRAM  PERCLOSE FEMORAL VESSELS  POST BAV--+1 (+3 mL TOTAL)     ISSUES:     LVEDP:  27     PACING:  NONE ADDED     COMPLETE OP NOTE:  IN EPIC     REFERRING CARDIOLOGIST:  Latrelle Dodrill, MD     CONDITION:  STABLE TO ICU     FINDINGS:  GOOD POSITION WITH MODERATE PVL REDUCED TO MILD OR LESS POST BAV     Specimen(s): NONE     Estimated Blood Loss: < 50 mL     Blood Administration: None     Complications: No complications were noted     Grafts/Implants:    Implant Name Type Inv. Item Serial No. Manufacturer  Lot No. LRB No. Used Action   DEVICE PERCLOSE PROSTYLE CLSR SUT MEDIATE REPAIR STERILE - [id number]   DEVICE PERCLOSE PROSTYLE CLSR SUT MEDIATE REPAIR STERILE     [id number]   3 Implanted   SAPIEN 3 ULTRA TRANSCATHETER HEART VALVE (PART OF [id number], CAN'T BE ORDERED SEPARATELY ACCORDING TO MFG) Valve       [id number]   1 Implanted      CONTRAST:   128 ML  FLOURO TIME:  25.8 Min  Gys:   876 MGy  LAST ACT:  20 mg PROTAMINE GIVEN x 2  DESCRIPTION OF PROCEDURE:       The patient, [name] is a 76 y.o. female, was placed supine on the table.  After adequate MAC anesthesia, the patient positioned, prepped and draped in standard fashion.  Both groin infiltrated with 1% Xylocaine.  Site of implant chosen was the right femoral artery.  Access obtained with ultrasound and micropuncture kit.  A 7 x 23-French sheath was placed in the left femoral artery.  A 7 x 35-French sheath placed in the left femoral vein.  The Arterial sheath was attached to a monitoring transducer and the Venous sheath was attached to a volume line and both were managed by Anesthesia.  A micro sheath placed in the right femoral artery, selective right femoral artery angiogram performed showing good position.  A 6 x 23-French sheath placed.  We preclosed with 2 Perclose devices and upsized to an 8 x 45 sheath and then placed the E sheath.  This advanced easily, 10,000 units of heparin given.  ACT maintained greater than 250 throughout the procedure.  Via the contralateral arterial sheath, pigtail catheter placed in noncoronary sinus of Valsalva.  Via the venous sheath, a 5-French balloon tipped pacing lead placed, placing the tip in the apex of the right ventricle with excellent thresholds.  Via the E sheath an AL1 catheter and soft-tip straight wire were used to cross the valve advancing the AL1 and exchanging for a pigtail.  Simultaneous left ventricular and aortic pressures were obtained.  Safari wire placed with coil in the preapical  area.  We did not predilate (BAV) with the [facility]  Balloon.  We prepared a 26 [employer] Sapien S3 and positioned it in the aortic annulus. Balloon volume was nominal plus 2 mL.  We were happy with the position and deployed the valve.  There was moderate perivalvular leak.  We performed a BAV x 1 using the [employer] balloon plus 1mL this expanded the valve nicely.  There was mild or less perivalvular leak.  We felt that this was a good result.  Equipment was removed from the heart.  Safari wire remained in the E sheath, removing the E sheath.  It was not necessary to use an additional Perclose to maintain hemostasis.  Manual pressure was maintained until there was no bleeding.  Protamine was given as indicated.  Contralateral arterial sheath was removed and closed with a single Perclose device.  Venous sheath was removed.  Manual pressure maintained.  No temporary pacing lead was indicated.  The patient was prepared for transport to the intensive care unit in stable condition.    Post TAVR ECHO (12/19/21):   Status post TAVR, 26 mm Sapien  No paravalvular leak (PVL).   Mean aortic valve gradient is 16 mmHg.   The left ventricle (LV) is normal in size with mild left ventricular hypertrophy.  LV systolic function is normal. LV ejection fraction (EF) is 60 to 65%.  Diastolic function is indeterminate.  The right ventricle (RV) is mildly dilated with mildly reduced systolic function.  Mildly dilated left atrium.  Right ventricular systolic pressure is normal.   Inferior Vena Cava: normal in size with greater than 50% collapse.  No prior studies available for comparison.    EKG (12/19/21): SINUS RHYTHM FIRST DEGREE AV BLOCK   EKG (12/18/21): SINUS RHYTHM FIRST DEGREE AV BLOCK   EKG (12/17/21): SINUS RHYTHM FIRST DEGREE AV BLOCK     Telemetry: SR rate 60s    Lab Results   Component Value Date    WBC 10.30 12/19/2021    HGB 11.1 (L) 12/19/2021    HCT 34.6 12/19/2021    MCV 90.0 12/19/2021    PLT 180 12/19/2021     Lab  Results   Component Value Date    CREATININE 0.71 12/19/2021    BUN 16 12/19/2021    NA 137 12/19/2021    K 4.2 12/19/2021    CL 106 12/19/2021    CO2 27 12/19/2021     Lab Results   Component Value Date    INR 1.04 12/18/2021    INR 0.98 12/17/2021    PROTIME 14.0 12/18/2021    PROTIME 13.3 12/17/2021       Inpatient Consults: none    Discharge Exam:    BP 103/57 (BP Location: Left arm, Patient Position: Lying)   Pulse 60   Temp 98.6 F (37 C) (Oral)   Resp 18   Ht 5\' 7"  (1.702 m)   Wt (!) 103.9 kg (229 lb)   SpO2 96%   BMI 35.87 kg/m     Physical Examination:  General -  NAD, sitting up in bed  Eyes -  Conjunctiva and cornea are clear  Neck -  Supple, non-tender   Heme -  No bruising/bleeding  Lungs-  Unlabored, on RA, CTA bil; no rales/ rhonchi/ wheezes  Cardiac -   RRR, normal S1, and S2, 2/6 soft systolic murmur loudest at RUSB and auscultated on precordium  Abdomen - Normo-active bowel sounds, soft and non-tender  Extremities -  Groins C/D/I, warm, 2+ radial and 1+ DP pulses bil, no LE  edema  Musculoskeletal -  Moves all extremities   Neuro -  A&O x 4    Disposition: Home      Discharge Meds:      Discharge Medications        Medications To Continue        Sig   albuterol 90 mcg/actuation inhaler  Commonly known as: PROVENTIL HFA   Inhale 2 puffs into the lungs every 6 (six) hours as needed.     aspirin 81 mg EC tablet   Take 1 tablet (81 mg total) by mouth in the morning. Take with food. Marland Kitchen     atorvaSTATin 40 mg tablet  Commonly known as: LIPITOR   Take 1 tablet (40 mg total) by mouth in the morning.     clopidogreL 75 mg tablet  Commonly known as: PlaVIX   Take 1 tablet (75 mg total) by mouth in the morning.     fluticasone propion-salmeteroL 100-50 mcg/dose diskus inhaler  Commonly known as: ADVAIR   Inhale 1 puff into the lungs every 12 (twelve) hours.     gabapentin 300 MG capsule  Commonly known as: NEURONTIN   Take 1 capsule (300 mg total) by mouth in the morning and 1 capsule (300 mg total) in  the evening and 1 capsule (300 mg total) before bedtime.     loratadine 10 mg tablet  Commonly known as: CLARITIN   Take 1 tablet (10 mg total) by mouth in the morning.  .     montelukast 10 mg tablet  Commonly known as: SINGULAIR   Take 1 tablet (10 mg total) by mouth nightly.     pantoprazole 40 MG tablet  Commonly known as: PROTONIX   Take 1 tablet (40 mg total) by mouth in the morning.     sertraline 100 MG tablet  Commonly known as: ZOLOFT   Take 1 tablet (100 mg total) by mouth in the morning.              Patient Instructions:      Activity: no lifting, Driving, or Strenuous exercise for 1 week, no submerging in pools/baths/hot tubs for 2 weeks.  You may shower, please pat the incisions dry with the towel and do not rub.  You may remove the bandages in 2-3 days.  Please check your BP, HR and weight daily and call us with changes.       Diet: regular diet and restricted 2 gram sodium diet      Follow Up Appointments:  1.) Please follow-up with the [facility] Valve Team on 01/23/2022 as noted. You will have an ECHO (an ultrasound of your heart) at [facility] 3rd floor in the [address] and then you will see one of the NP/PAs in [address] of the [address]. You can call us at [phone] anytime if you have questions or need anything. It was a pleasure taking care of you!  2.) Please make an appointment to see your primary cardiologist, Dr. Lysbeth Penner, in the next 1-2 weeks and your PCP within the month for post hospital follow up.    Ciro Backer, NP-C  12/19/2021  3:35 PM    Discharge Instructions:   A total of 50 minutes were spent giving the patient verbal and written instructions regarding wound care, heavy lifting restrictions, driving restrictions, problems to notify the office, such as: temperature greater than 100.10F, drainage/redness/odor from wounds, shortness of breath, dizziness, diarrhea, redness/swelling of any extremity, persistent nausea/vomiting, inability  to have a bowel movement,  inability to urinate, weight gain of > 3 lbs in 48 hours,or any other problem/question he/she may encounter.  she verbalized understanding.

## 2022-01-06 ENCOUNTER — Emergency Department: Payer: Medicare (Managed Care) | Admitting: Radiology

## 2022-01-06 ENCOUNTER — Ambulatory Visit: Payer: Medicare (Managed Care)

## 2022-01-06 ENCOUNTER — Other Ambulatory Visit: Payer: Self-pay

## 2022-01-06 ENCOUNTER — Emergency Department
Admission: EM | Admit: 2022-01-06 | Discharge: 2022-01-06 | Disposition: A | Discharge status: Home / Self Care | Payer: Medicare (Managed Care) | Source: Ambulatory Visit | Attending: Emergency Medicine | Admitting: Emergency Medicine

## 2022-01-06 DIAGNOSIS — Y998 Other external cause status: Secondary | ICD-10-CM | POA: Insufficient documentation

## 2022-01-06 DIAGNOSIS — Y92008 Other place in unspecified non-institutional (private) residence as the place of occurrence of the external cause: Secondary | ICD-10-CM | POA: Insufficient documentation

## 2022-01-06 DIAGNOSIS — W1830XA Fall on same level, unspecified, initial encounter: Secondary | ICD-10-CM | POA: Insufficient documentation

## 2022-01-06 DIAGNOSIS — W19XXXA Unspecified fall, initial encounter: Secondary | ICD-10-CM

## 2022-01-06 DIAGNOSIS — S0083XA Contusion of other part of head, initial encounter: Secondary | ICD-10-CM | POA: Insufficient documentation

## 2022-01-06 DIAGNOSIS — S0031XA Abrasion of nose, initial encounter: Secondary | ICD-10-CM | POA: Insufficient documentation

## 2022-01-06 DIAGNOSIS — Y9389 Activity, other specified: Secondary | ICD-10-CM | POA: Insufficient documentation

## 2022-01-06 DIAGNOSIS — S0990XA Unspecified injury of head, initial encounter: Secondary | ICD-10-CM

## 2022-01-06 DIAGNOSIS — M542 Cervicalgia: Secondary | ICD-10-CM | POA: Insufficient documentation

## 2022-01-06 NOTE — ED Notes (Signed)
Assumed care of pt, agree with triage note. Call bell in reach.  Nursing Care Plan:  Will monitor and assess VS and pain scores every 2-4 hours and prn.  Perform frequent rounding prn.  Provide updates to patient and/or cargiver frequently.  Provide support to patient/caregiver as needed.  Teach patient and/or caregivers about patients needs/status working towards discharge.  Patient oriented to room and given call bell.

## 2022-01-06 NOTE — Discharge Instructions (Signed)
You were seen the emergency department after hitting your head. You had a CT scan that did not show any bleeding into the brain or any other acute findings. Your neurologic exam was normal.    You may have a concussion after hitting your head. Symptoms of this include headache, difficulty focusing, decreased concentration, fatigue. Please read the instructions below after head injury.    You may take 650mg  tylenol every 6 hours for your pain. Do not take more than 3g of tylenol in 24 hours.    Please follow up with your primary care doctor in the next 3-5 days.     Return to the emergency department or contact your doctor if you develop vomiting, seizures, worsening headache, repetitive questioning, altered mental status, numbness, tingling, weakness, confusion, or any new, worsening, or concerning symptoms.      [facility] DEPARTMENT OF EMERGENCY MEDICINE  CONCUSSION DISCHARGE INSTRUCTIONS   ADULT (>16)      A. INSTRUCTIONS FOR AN ADULT WHO WILL WATCH THE PATIENT FOR THE NEXT 24 HOURS    WHAT TO LOOK FOR   IT IS IMPORTANT that the patient be watched closely for the first 24 hours at home. A concussion can cause slow bleeding or swelling of the brain that may not be apparent at first, although after this evaluation we feel this is very unlikely.    AN ADULT SHOULD  Stay with and check the patient every 2-4 hours for at least 24 hours, while you are both awake.  Call your doctor or return to  the emergency department for the following symptoms  a. Incorrect answers to questions such as: What day is it ? Where are you? Do you remember what happened to you?  b. Unusual behavior, restlessness, combativeness. Difficulty seeing, walking or using arms.  c. Increased sleepiness or drowsiness  d. Vomiting  e. Seizures (?Fits? or convulsions)  f. Increased headache      B. INSTRUCTIONS FOR THE PATIENT    WHAT TO EXPECT IN THE NEXT DAYS TO WEEKS  Concussions are a common injury, and most people recover fully, usually rapidly in the  first few days. If you find you are getting worse, you should see your doctor.  After a concussion you may develop the following symptoms:    HEADACHE- take acetaminophen (Tylenol?) or ibuprofen (Advil?, Motrin?) for headache pain  FATIGUE  DIZZINESS  IRRITABILITY AND EMOTIONAL INSTABILITY   TROUBLE WITH CONCENTRATION AND SHORT TERM MEMORY       RETURNING TO DAILY ACTIVITIES     Limit physical activity as well as activities that require a lot of thinking or concentration. These activities can make symptoms worse and slow your recovery.  Physical activities include exercising, running, cycling, weight-training, heavy lifting, etc.   Thinking activities include anything involving a screen (cell phone, computer, TV, video games), homework, reading, class work, etc.  Get lots of rest. Be sure to get enough sleep at night-no late nights. Keep the same bedtime weekdays and weekends.   Take daytime naps or rest breaks when you feel tired or fatigued.   Avoid alcohol and recreational drugs. These can make symptoms worse and slow your recovery  Drink lots of fluids and eat carbohydrates or protein to main appropriate blood sugar levels.   As symptoms decrease, you may begin to gradually return to your daily activities. If symptoms worsen or return, lessen your activities, then try again to increase your activities gradually.   During recovery, it is normal to feel  frustrated and sad when you do not feel right and you can?t be as active as usual.   Repeated evaluation of your symptoms is recommended to help guide recovery.

## 2022-01-06 NOTE — ED Provider Notes (Signed)
History     Chief Complaint   Patient presents with   ? Fall     76 y/o F with PMH aortic steonsis, CAD, asthma, carotid stenosis, OSA, obesity, DVT, PE, HLD, HTN who presents to the emergency department after a fall. Pt states she missed the step leading to the living room and fell off the one step and fell forward hitting her face on the carpet. She denies LOC. She is on ASA/plavix. Reports right sided neck pain since the fall. She does have a hx of c spine fx a couple years ago, non-op management. She denies numbness, tingling, weakness, slurred speech, confusion.      History provided by:  Patient and medical records  Language interpreter used: No          Medical/Surgical/Family History     Past Medical History:   Diagnosis Date   ? Allergic rhinitis 09/30/2014   ? Aortic stenosis 04/14/2017    Moderate    ? Arthritis    ? Asthma    ? CAD (coronary artery disease)    ? Carotid stenosis, bilateral 10/31/2019    6/21- incidental finding during hospitalization, >70% 9/21- Severe, surgery delayed due to odontoid fracture winter 2021 1/22- severe, recommending   cardiac  clearance and then CEA vs TCAR   ? Chronic obstructive pulmonary disease 06/30/2013    PFTs 2/19: FEV1/FVC 0.65, FEV1 74% 3/21:Fev1/fvc 0.65, FEV1 64%  FEV1 has decreased by 16% and FVC has decreased by 10% . Transitioned to trelegy  12/21- Fev1/FVC 67 ; FEV1 73% The values have increased compared to previous testing in March 2021. The FEV1 and FVC increased by 14%  and 10% respectively.   ? DVT (deep venous thrombosis)     post-op after CABG, on anticoagulation for ~2 years   ? PE (pulmonary embolism)     post-op after CABG 2013, repeat 12/2019    ? Prediabetes    ? Severe Obstructive sleep apnea 11/11/2013    Adherent with CPAP   ? Tubular adenoma 03/2015    colonoscopy         Patient Active Problem List   Diagnosis Code   ? Severe Obstructive sleep apnea G47.33   ? Pulmonary nodule R91.1   ? Coronary atherosclerosis I25.10   ? Prediabetes R73.03    ? Gastritis K29.70   ? Morbid obesity E66.01   ? Dysthymia F34.1   ? Facial fracture due to fall S02.92XA, [id number] Skeens   ? Orbital floor fracture S02.30XA   ? Restless leg syndrome G25.81   ? Health care maintenance Z00.00   ? Aortic stenosis I35.0   ? Anxiety and depression F41.9, F32.A   ? Chronic obstructive pulmonary disease J44.9   ? Osteopenia M85.80   ? Environmental allergies Z91.09   ? Closed odontoid fracture with type III morphology and routine healing, subsequent encounter S12.120D   ? Endometrial thickening on CT R93.89   ? Carotid stenosis, bilateral I65.23   ? Acute chest pain R07.9   ? Asthma J45.909   ? Backache M54.9   ? Benign hypertensive heart disease without congestive heart failure I11.9   ? DVT (deep venous thrombosis), right I82.401   ? Shortness of breath R06.02   ? HTN (hypertension) I10   ? 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 Pulmonary embolism I26.99   ? Tachycardia R00.0   ? Adnexal cyst N94.9   ? R TCAR 09/04/2020 I65.29   ? Sinus bradycardia R00.1  Past Surgical History:   Procedure Laterality Date   ? ACHILLES TENDON SURGERY     ? APPENDECTOMY     ? CAROTID ARTERY ANGIOPLASTY Right 09/04/2020   ? CHOLECYSTECTOMY     ? CORONARY ANGIOPLASTY WITH STENT PLACEMENT  2012   ? CORONARY ARTERY BYPASS GRAFT  2013    triple    ? CYST REMOVAL      both thumbs   ? PR OPEN TX ORBITAL FLOOR BLOWOUT FX 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    Procedure: ORBIT ORIF;  Surgeon: Rocky Crafts, DDS;  Location: [facility] MAIN OR;  Service: OMFS   ? TONSILLECTOMY AND ADENOIDECTOMY       Family History   Problem Relation Age of Onset   ? Other Mother         "rare lung disease"   ? Aneurysm Mother         abdominal aortic aneurysm   ? Diabetes Type II Mother    ? Breast cancer Mother    ? Blood clots Mother    ? Stroke Father    ? Stroke Brother         x2   ? Thyroid cancer Daughter    ? Anesthesia problems Neg Hx           Social History     Tobacco Use   ? Smoking status: Never   ? Smokeless tobacco:  Never   Substance Use Topics   ? Alcohol use: Not Currently     Comment: rare   ? 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  First Pain Reported  0-10 Scale: 5, Pain Location/Orientation: Neck, (01/06/22 1554)       Physical Exam  Vitals and nursing note reviewed.   Constitutional:       Appearance: Normal appearance.   HENT:      Head: Normocephalic.      Comments: Bruising to middle forehead. Abrasion on the bridge of pts nose. No crepitus to facial bones or significant ttp.     Mouth/Throat:      Mouth: Mucous membranes are moist.   Eyes:      Extraocular Movements: Extraocular movements intact.      Conjunctiva/sclera: Conjunctivae normal.      Pupils: Pupils are equal, round, and reactive to light.   Cardiovascular:      Rate and Rhythm: Normal rate and regular rhythm.      Heart sounds: Normal heart sounds.   Pulmonary:      Effort: Pulmonary effort is normal. No respiratory distress.   Skin:     General: Skin is warm.   Neurological:      Mental Status: She is alert and oriented to person, place, and time.  Comments: Neurological Examination:  Mental Status: Awake and alert. Oriented to person, place, and time. Fluent. Affect appropriate.  Cranial Nerves:       II: Discs sharp, pupils 3/3 to 2/2    III/IV/VI: Versions intact without nystagmus, no gaze preference    V: Facial sensation symmetric to light touch    VII: Facial expression symmetric    VIII: Hearing intact to  voice    IX/X: Palate elevates symmetrically    XI: Shoulder shrug symmetric    XII: Tongue midline  Motor: Bulk, tone, and strength were normal throughout. Pronator drift was absent. There were no abnormal movements.  Sensory: Sensation to light touch intact.   Coordination: Finger to nose intact     Psychiatric:         Mood and Affect: Mood normal.         Behavior: Behavior normal.         Thought Content: Thought content normal.         Judgment: Judgment normal.         Medical Decision Making     Assessment:  76 y/o F with PMH aortic steonsis, CAD, asthma, carotid stenosis, OSA, obesity, DVT, PE, HLD, HTN who presents to the emergency department after a mechanical fall down 1 step hitting her face. Pt with abrasion to nose, bruise to forehead. Some R neck pain, has hx c spine fx.     Differential diagnosis:  ICH  Concussion  Cervical spine injury  Less likely nasal bone fx- no crepitus, some tenderness where abrasion is, no obvious displacement     Plan:  Orders Placed This Encounter      CT head without contrast      CT cervical spine without contrast      Ensure C-Collar is Applied    Took APAP prior to arrival           ED Course as of 01/06/22 1844   Sun Jan 06, 2022   1814 CT head without contrast  Soft tissue contusion overlying the right frontal scalp without underlying calvarial fracture.     Asymmetric swelling of the right lacrimal gland which is new from prior. This may be traumatic, however cannot exclude infectious/inflammatory.     Sequelae of chronic small vessel disease.    1832 CT cervical spine without contrast  No acute cervical spine fracture or traumatic malalignment.    49 Findings discussed with pt and her daughter. No C-spine tenderness to palpation. FROM of neck and axial load without c-spine or posterior neck tenderness. C-spine cleared.  Reviewed incidental finding of R lacrimal gland swelling, f/up PCP. Return precautions reviewed. Replaced nose abrasion dressing as she had some  blood on it, no active bleeding at this time. Stable for discharge.       [address] [place]             Arbie Cookey [place], [place]  01/06/22 [phone]

## 2022-01-06 NOTE — ED Triage Notes (Addendum)
Mechanical fall. Mistepped going down into sunken living room. Fell onto carpeted floor striking her face. Abrasion noted on forehead. Band-aid over cut on bridge of nose.    Pt is having neck pain today. Pt has existing fractured cervical vertebrae.      C-collar placed in triage.

## 2022-01-12 ENCOUNTER — Encounter: Payer: Self-pay | Admitting: Emergency Medicine

## 2022-05-24 ENCOUNTER — Encounter: Payer: Self-pay | Admitting: Internal Medicine

## 2022-12-31 IMAGING — CT CT ABDOMEN PELVIS WITHOUT CONTRAST
2 of 3 series · 17 of 46 positions shown, 19 images · non-contrast
Comparison: None available.

Post cath hypotension-concern for peritoneal hematoma
FINAL REPORT:
EXAM: CT ABDOMEN PELVIS WITHOUT CONTRAST
INDICATION: post cath hypotension , concern for peritoneal hematoma
TECHNIQUE: CT abdomen and pelvis without IV contrast, using multiplanar reconstructions.
All CT scans at this facility use dose modulation and/or weight based dosing when appropriate to reduce radiation dose to as low as reasonably achievable. (#[id number]#)

[Series 2: renal stone · axial · 0.98mm/px · z∈[-388,+69]mm · 14 of 211 slices shown, 16 images]
[im 14/211  soft-tissue]
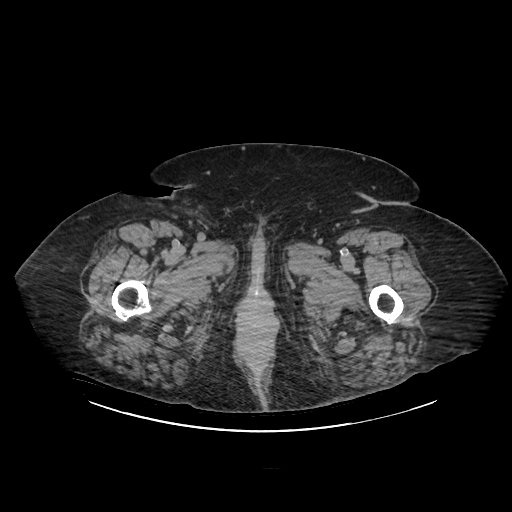
[im 14/211  bone]
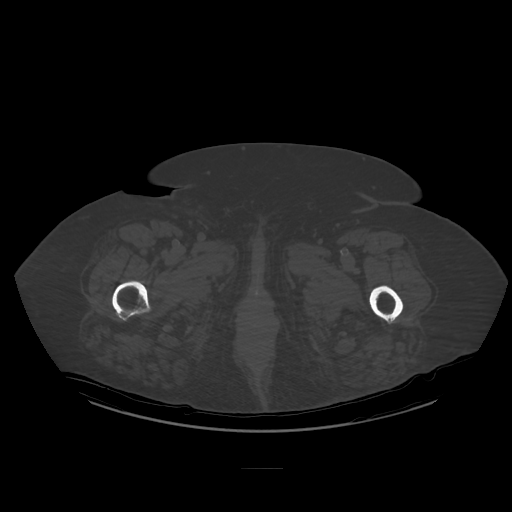
[im 28/211  soft-tissue]
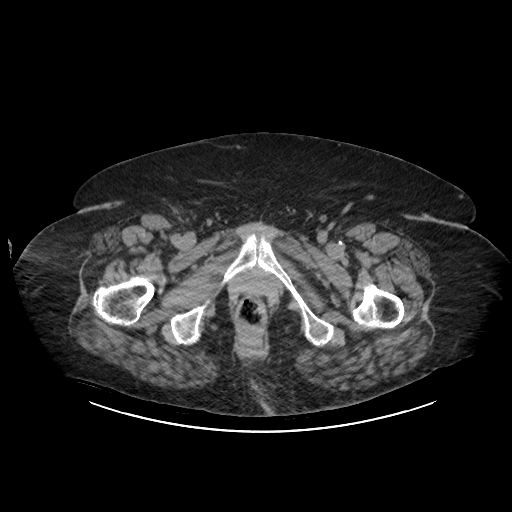
[im 41/211  soft-tissue]
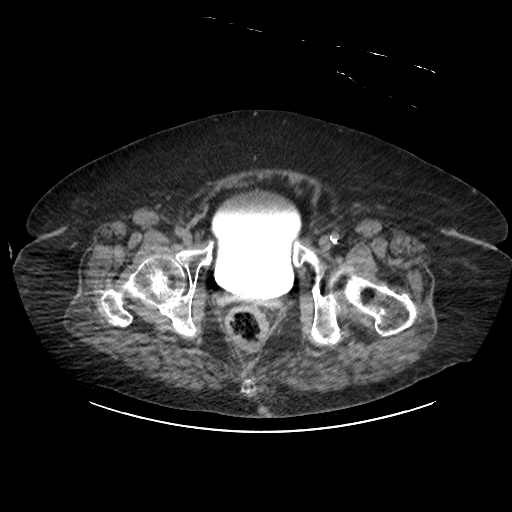
[im 55/211  soft-tissue]
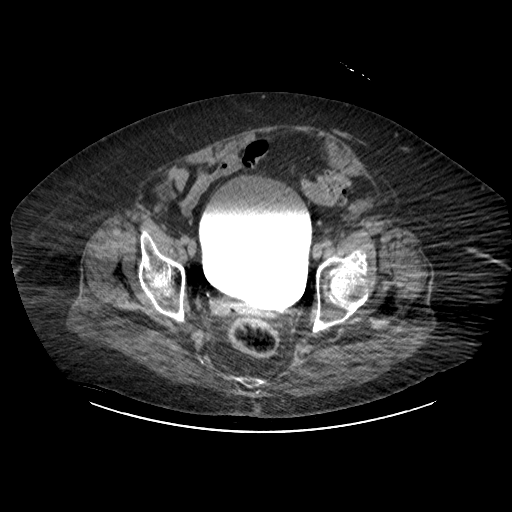
[im 68/211  soft-tissue]
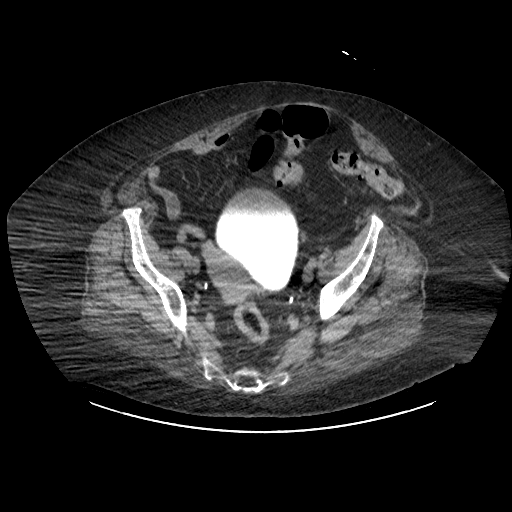
[im 82/211  soft-tissue]
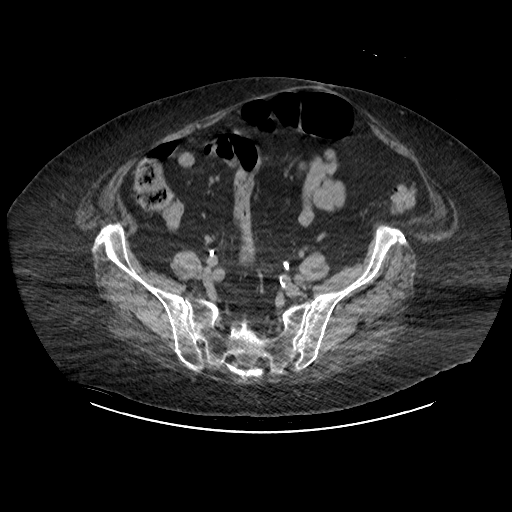
[im 95/211  soft-tissue]
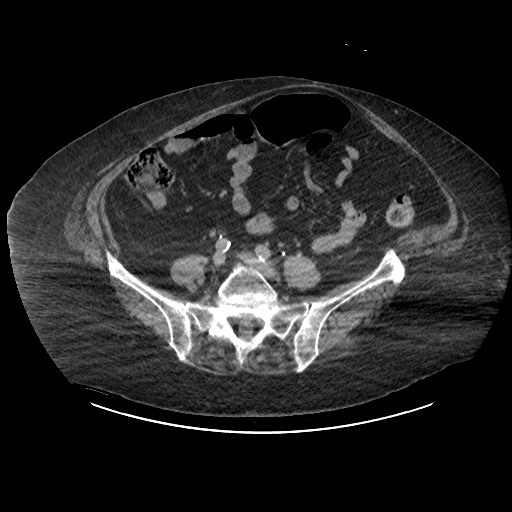
[im 116/211  soft-tissue]
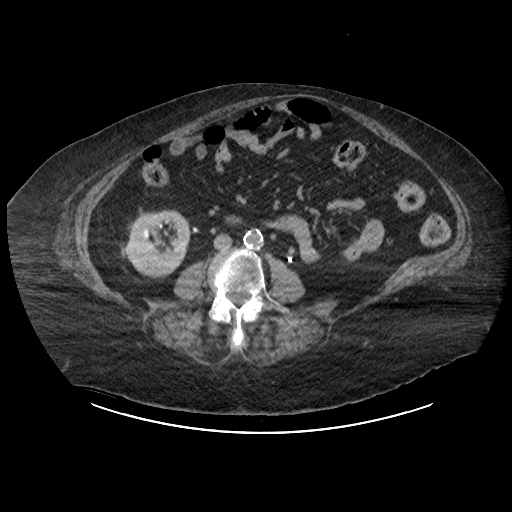
[im 129/211  soft-tissue]
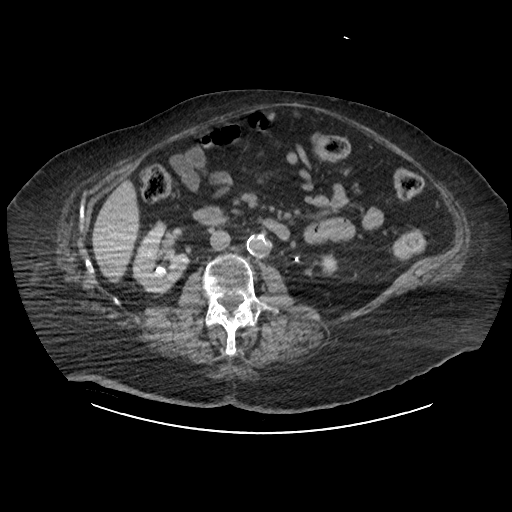
[im 129/211  bone]
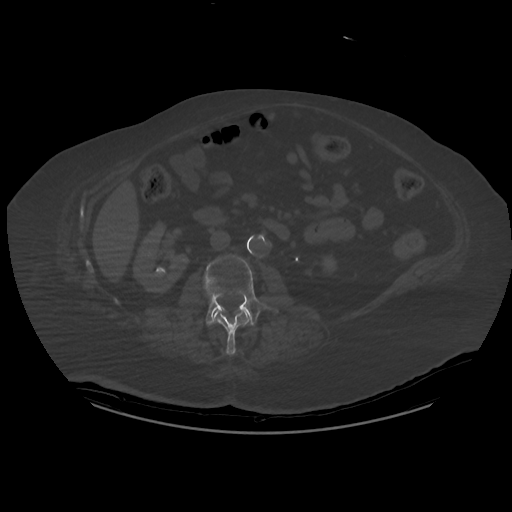
[im 143/211  soft-tissue]
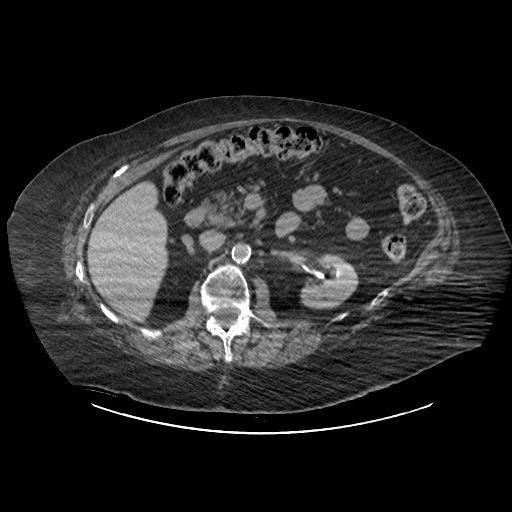
[im 156/211  soft-tissue]
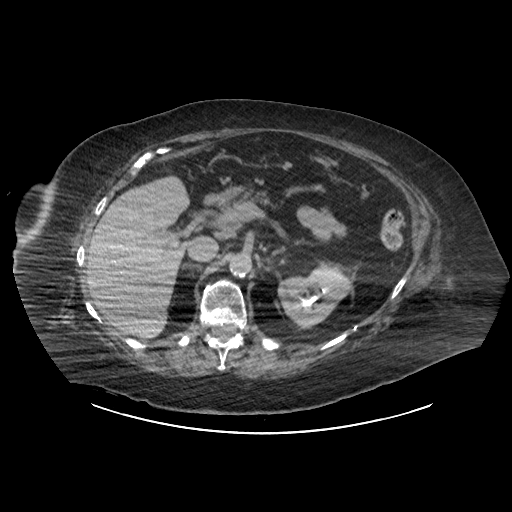
[im 170/211  soft-tissue]
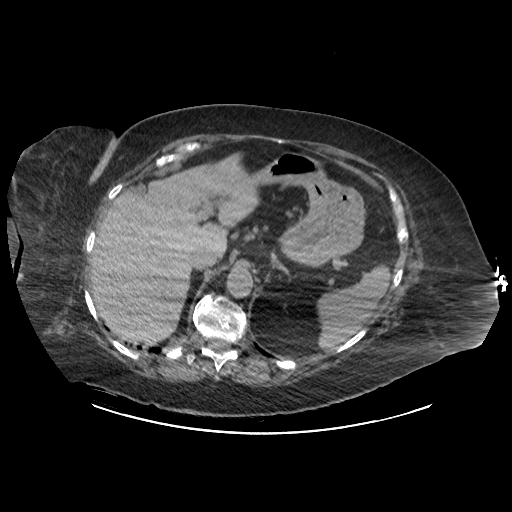
[im 183/211  soft-tissue]
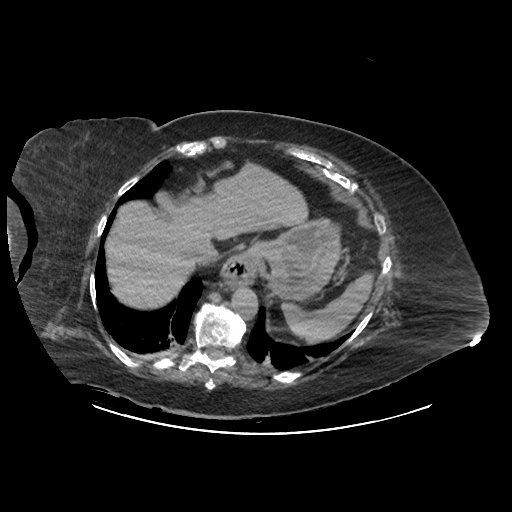
[im 197/211  soft-tissue]
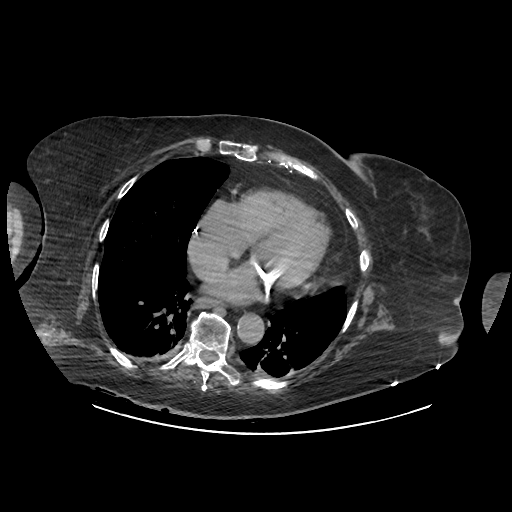

[Series 602: sag standard 2x2 · sagittal · 1.03mm/px · 3 of 248 slices shown]
[im 83/248  soft-tissue]
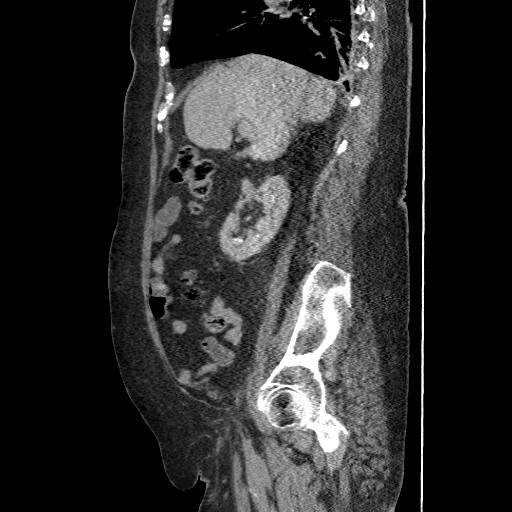
[im 110/248  soft-tissue]
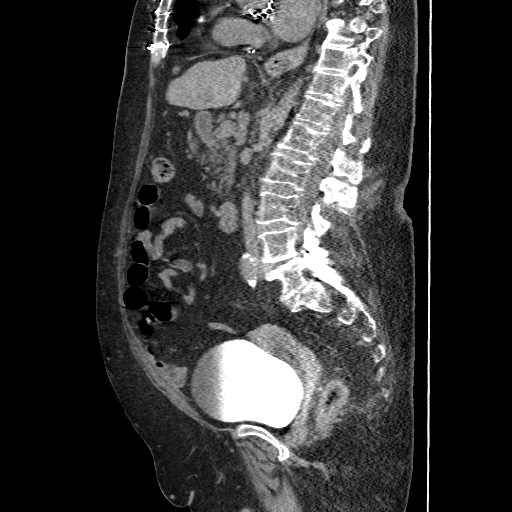
[im 138/248  soft-tissue]
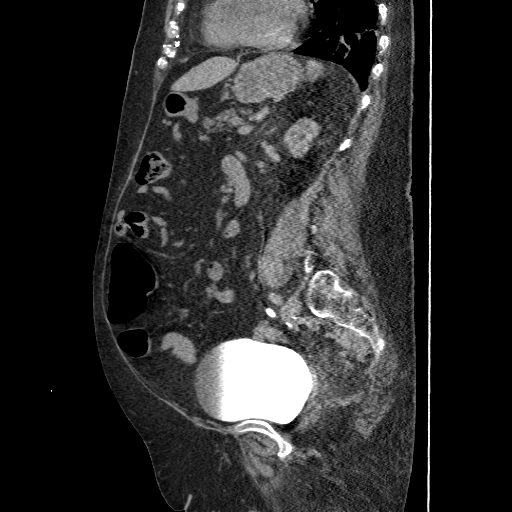

[17 of 46 positions shown; findings below may reference images not displayed]

FINDINGS: LOWER CHEST: Bibasilar atelectasis.
Limited evaluation of the solid abdominal viscera without intravenous contrast.
LIVER: Unremarkable.
BILIARY TREE: Unremarkable.
SPLEEN: Unremarkable.
PANCREAS: Unremarkable.
ADRENAL GLANDS: Unremarkable.
KIDNEYS/URETERS/BLADDER: Kidneys are unremarkable, without hydronephrosis. Urinary bladder is unremarkable. Excreted contrast is noted within the bilateral renal collecting systems, ureters, and urinary bladder without evidence of extraluminal extravasation.
GENITAL STRUCTURES: Unremarkable.
GASTROINTESTINAL TRACT: Small hiatal hernia. Diverticulosis. No focal bowel wall thickening or obstruction. The appendix is not definitively visualized, however there are no inflammatory changes in the right lower quadrant.
LYMPHATICS: No lymphadenopathy.
VASCULATURE: Mild arterial calcinosis.
ABDOMINAL CAVITY: No fluid collection or free air.
ABDOMINAL WALL/SOFT TISSUES: Unremarkable.
BONES: Degenerative changes throughout the visualized spine.
IMPRESSION: 1.  No acute findings in the abdomen/pelvis. No evidence of retroperitoneal hematoma.
2.  Excreted IV contrast is noted in the urinary bladder without evidence of extraluminal extravasation.
3.  Diverticulosis.
4.  Small hiatal hernia.

## 2023-04-17 IMAGING — CR XR FOOT 3+ VIEWS RIGHT
3 series · 3 of 3 positions shown · non-contrast
Comparison: None available.

Foot pain
FINAL REPORT:
XR FOOT 3+ VIEWS RIGHT
INDICATION: Pain in right foot

[AP]
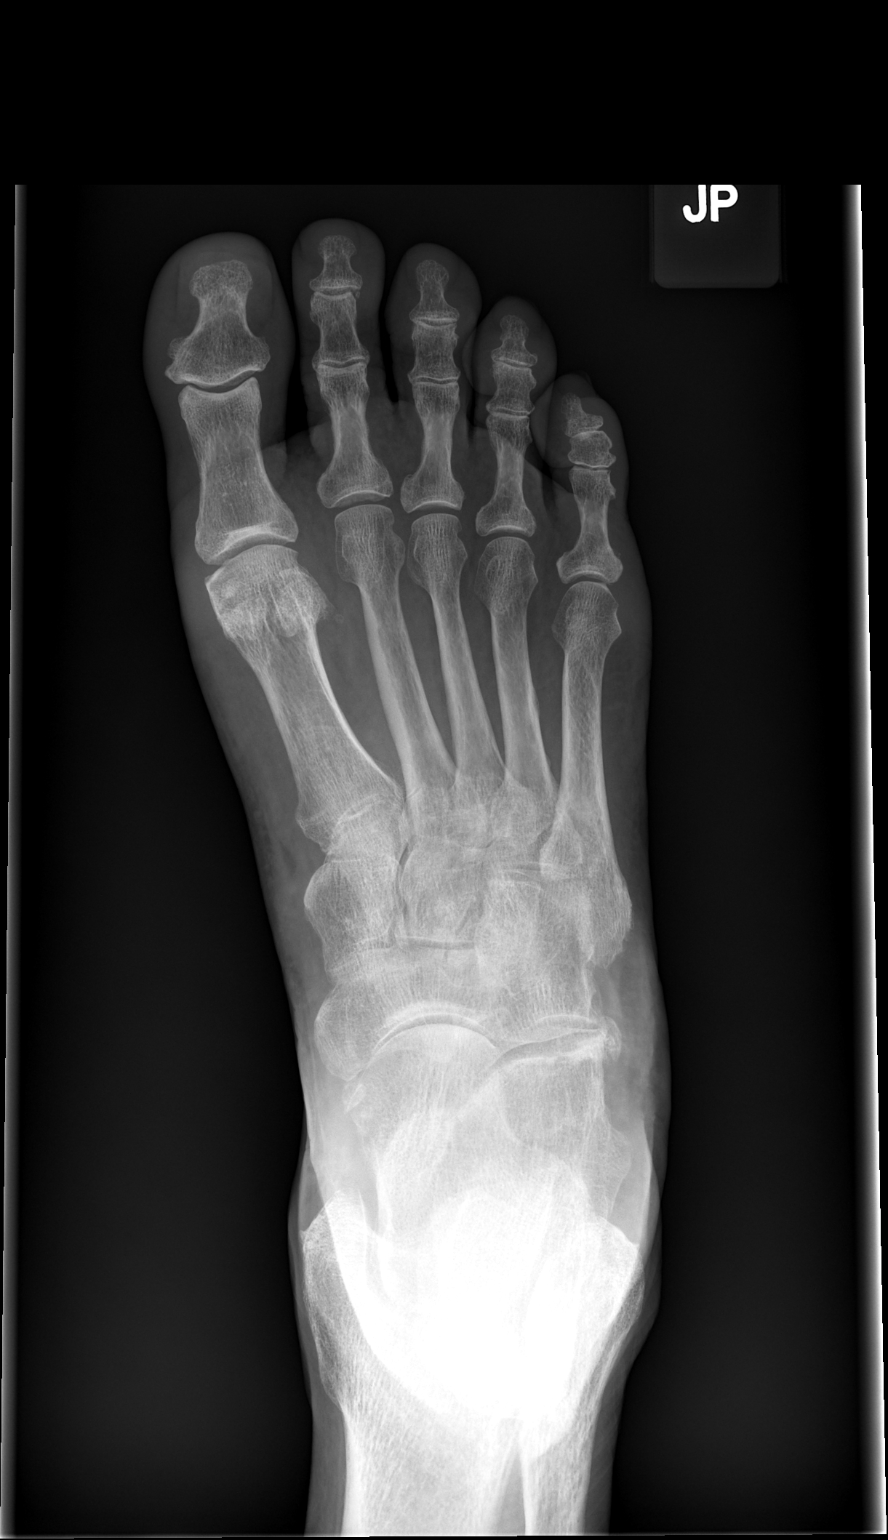

[rlo]
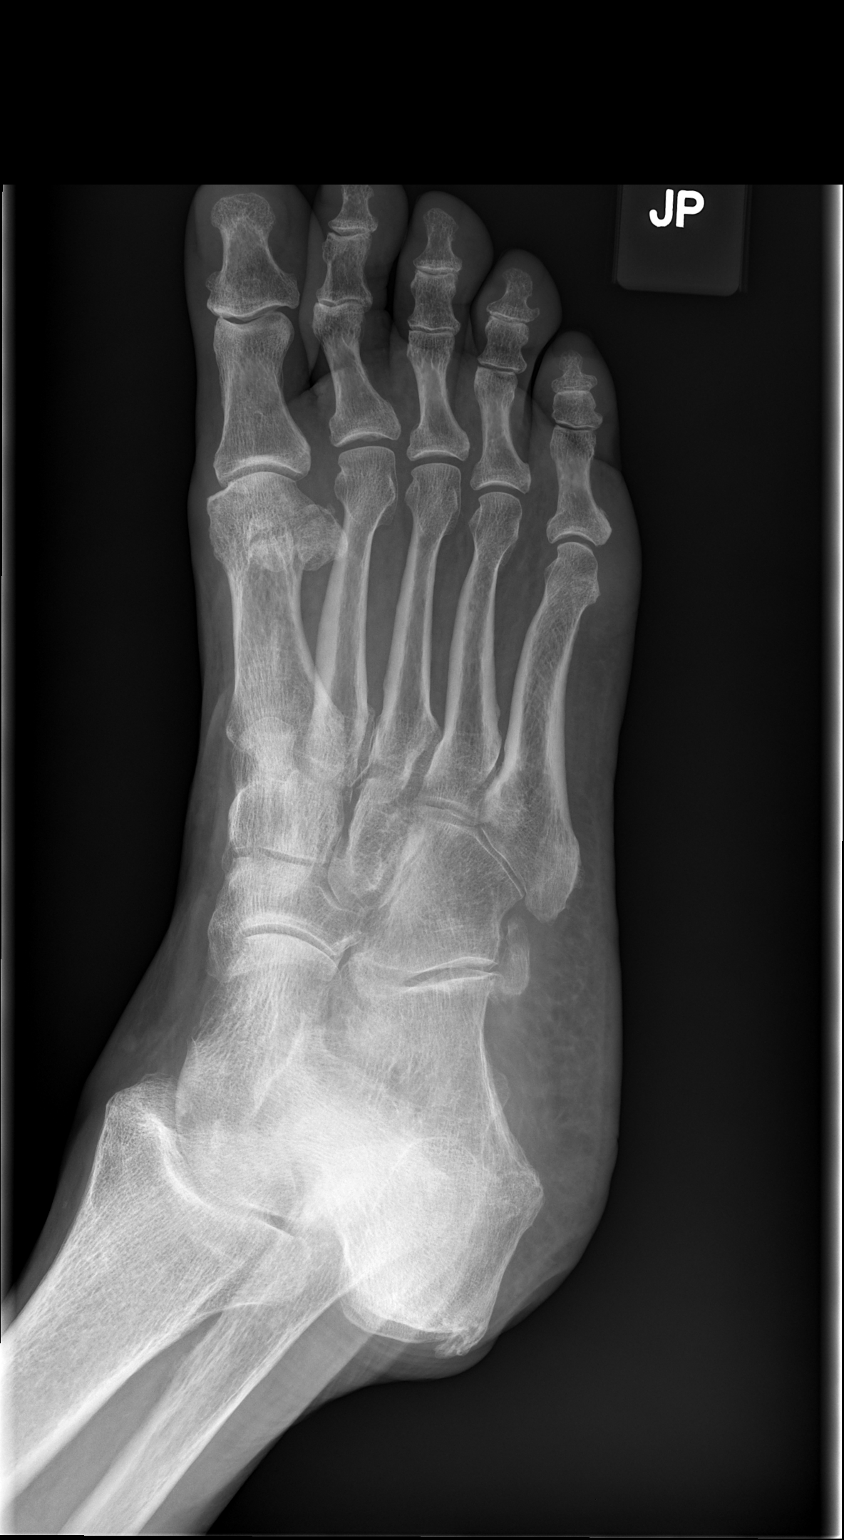

[right lateral]
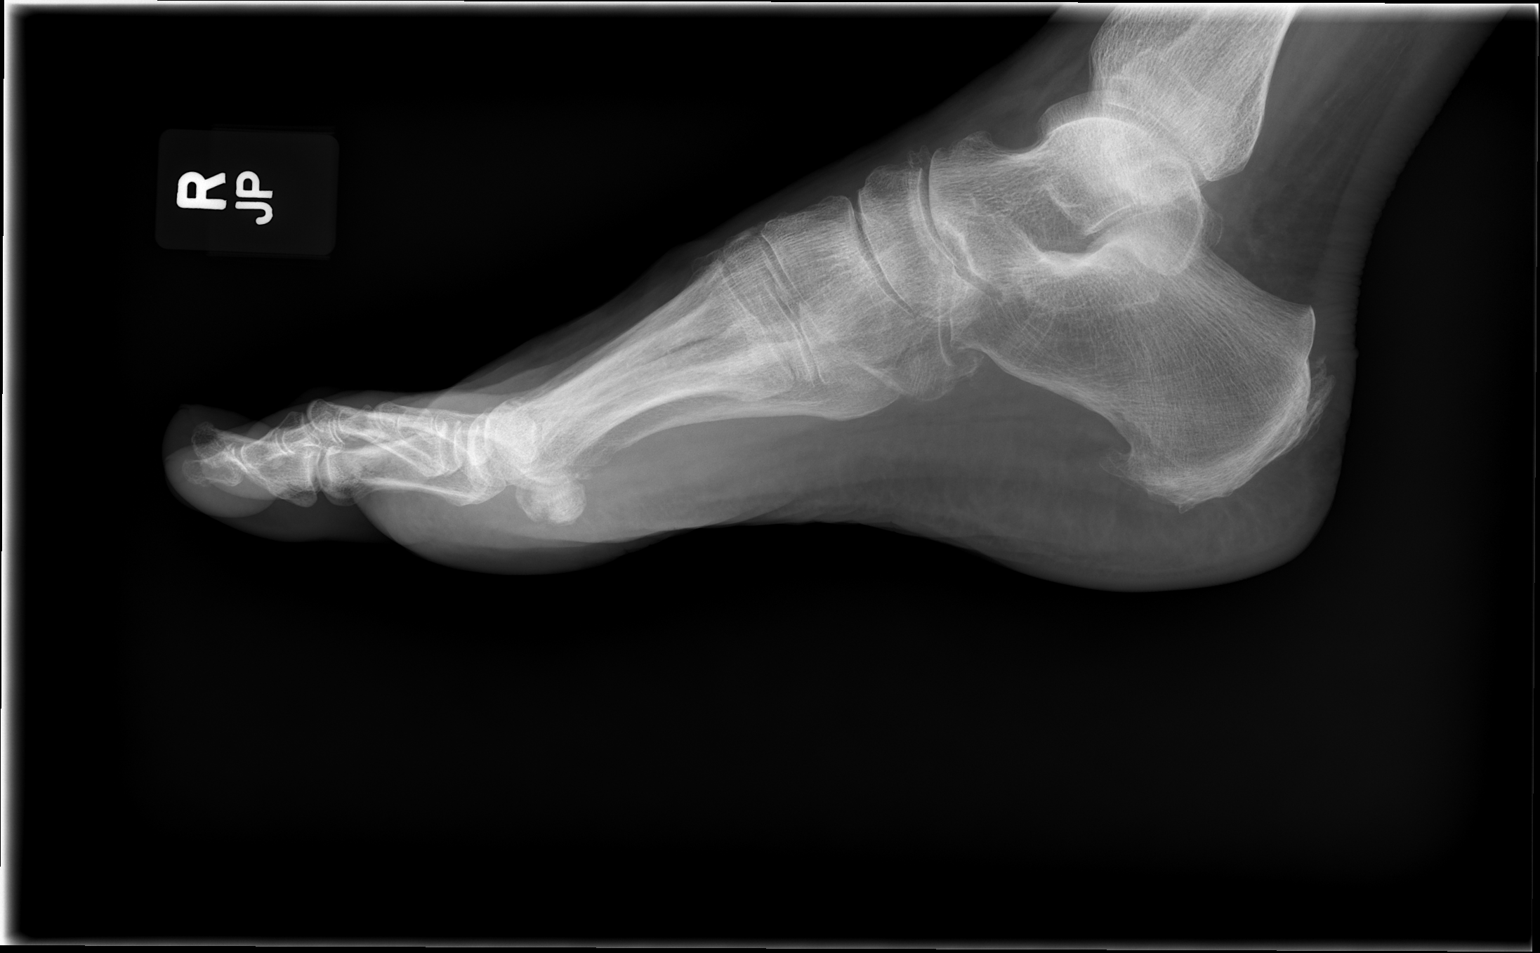

[3 of 3 positions shown; findings below may reference images not displayed]

FINDINGS: 3 views were obtained.
No acute fracture or malalignment. Joint spaces are maintained. No soft tissue swelling, radiopaque foreign body or gas. There is a posterior and plantar calcaneal spur.
IMPRESSION: No acute osseous abnormality.

## 2023-08-18 IMAGING — US US CAROTID DOPPLER BILATERAL
1 series · 13 of 24 positions shown · non-contrast
Comparison: none

FINAL REPORT:
REASON FOR EXAM: Carotid stenosis
STUDY PERFORMED: Bilateral carotid artery duplex.

[Series 1: us carotid doppler bilateral · 13 of 36 slices shown]
[im 1/36]
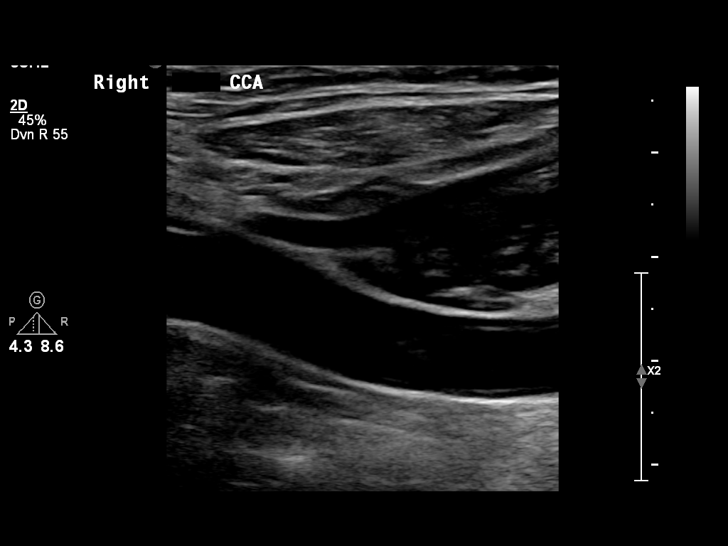
[im 4/36]
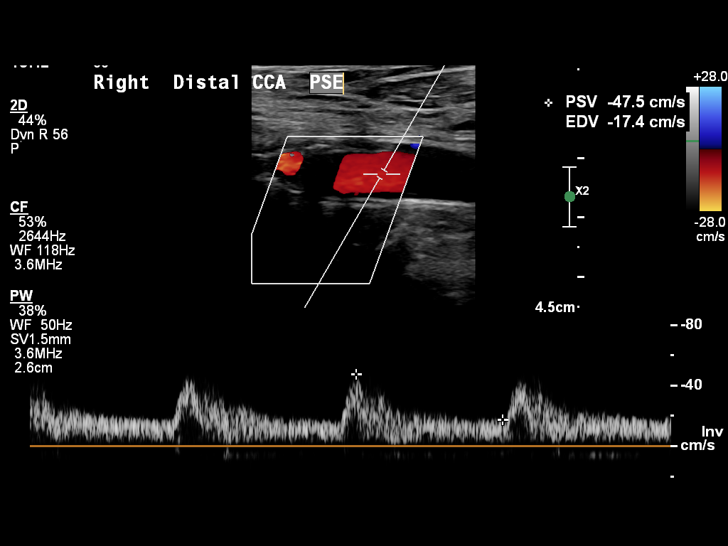
[im 7/36]
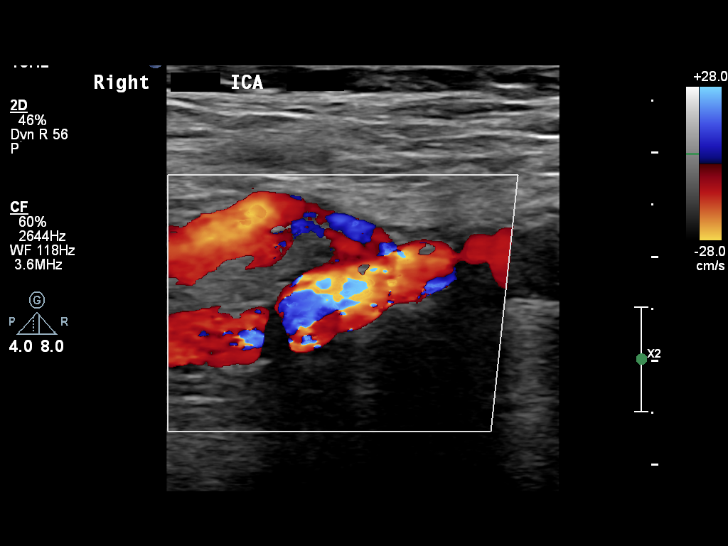
[im 10/36]
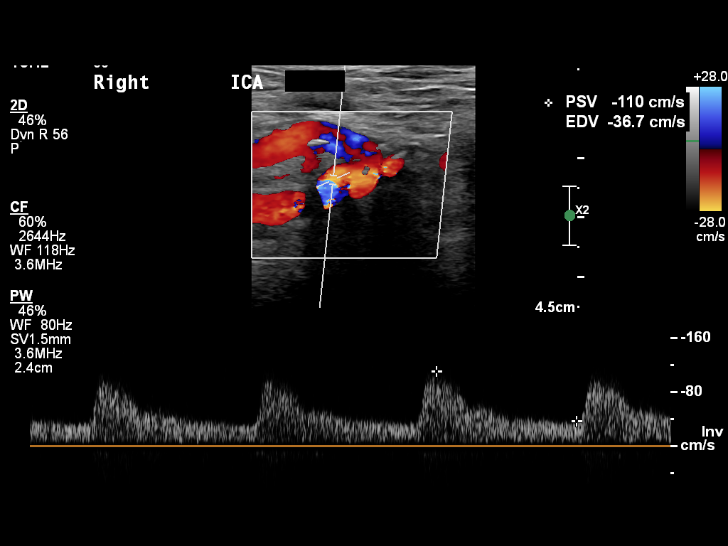
[im 13/36]
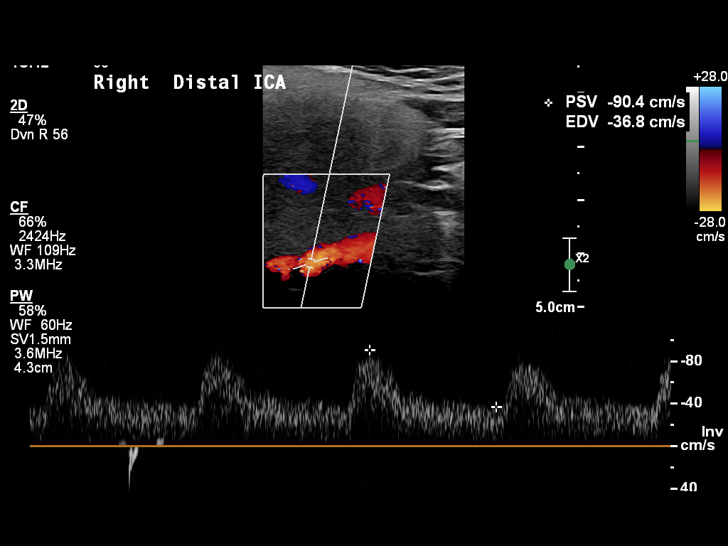
[im 16/36]
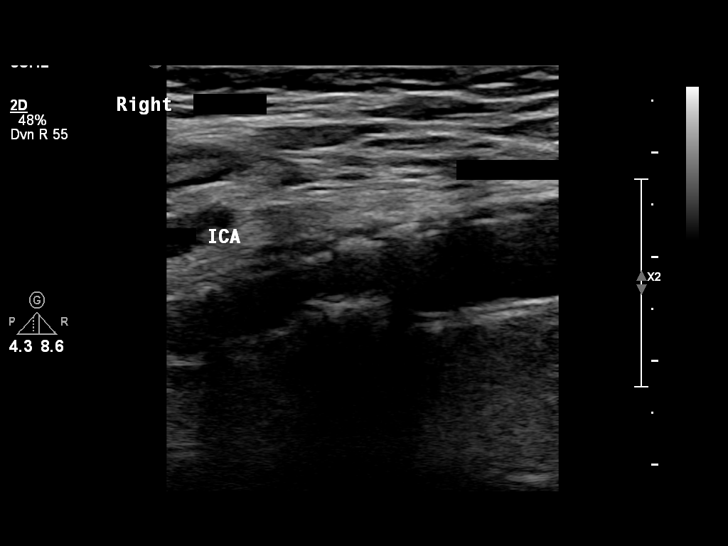
[im 19/36]
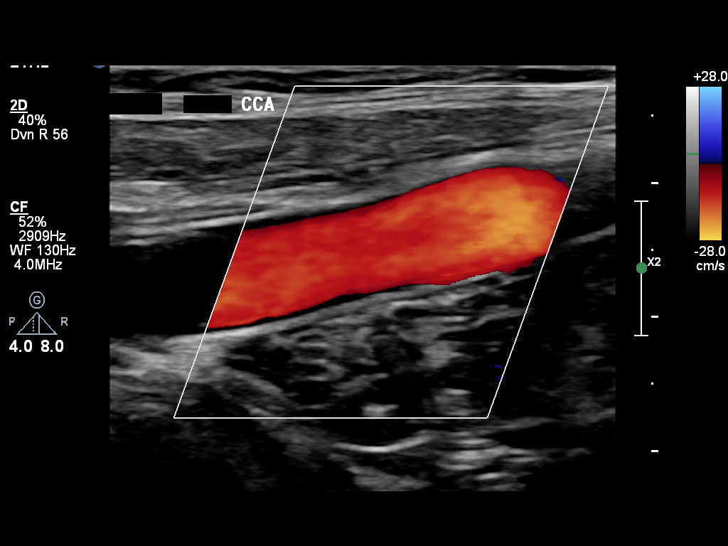
[im 20/36]
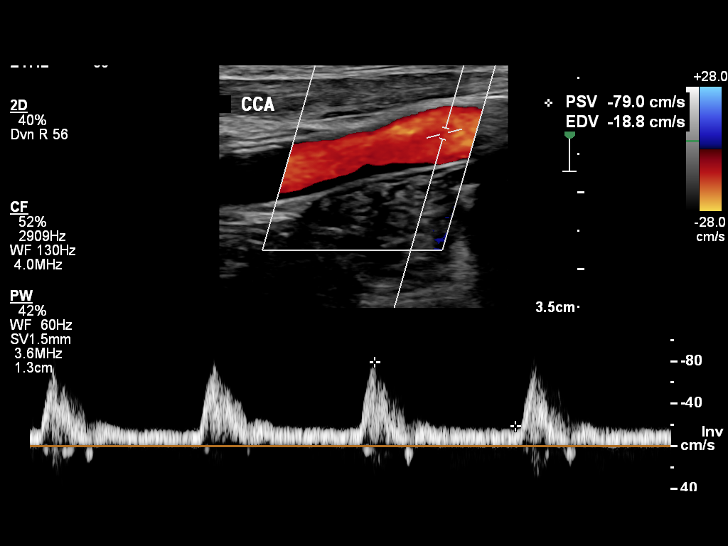
[im 23/36]
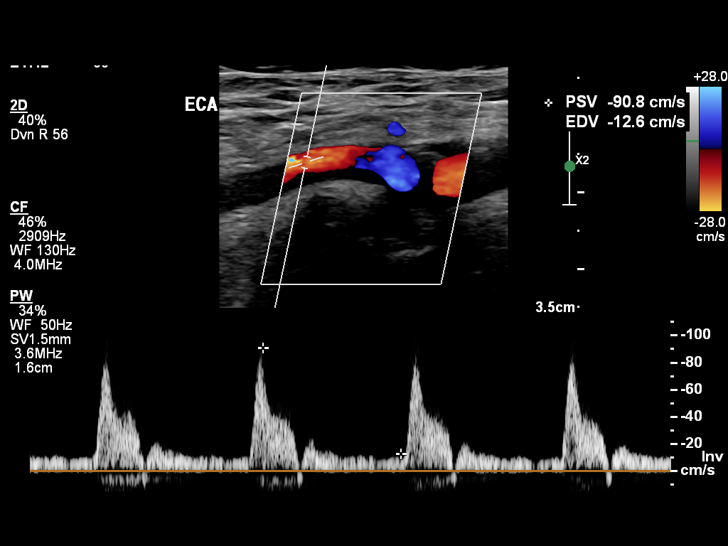
[im 26/36]
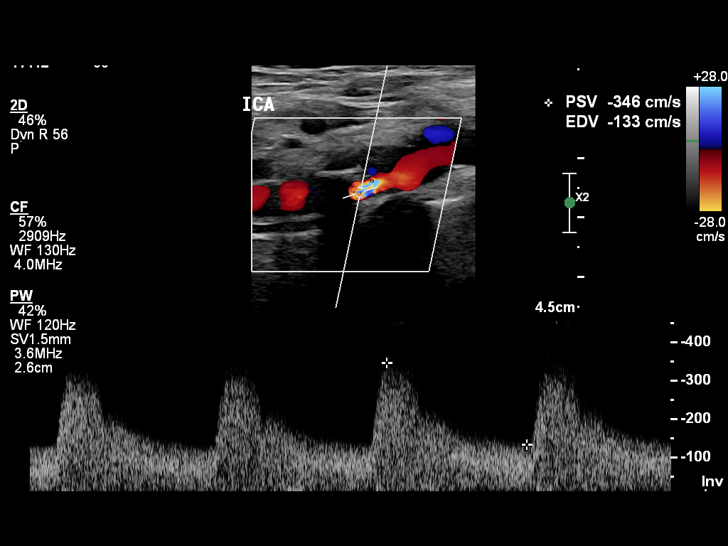
[im 29/36]
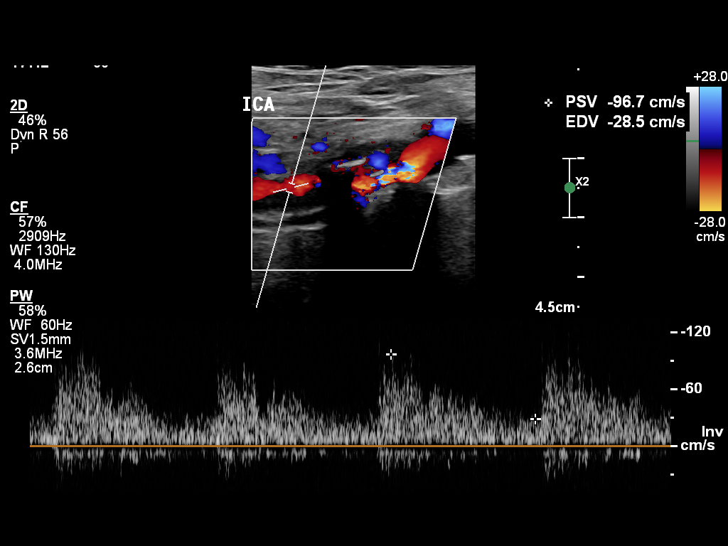
[im 32/36]
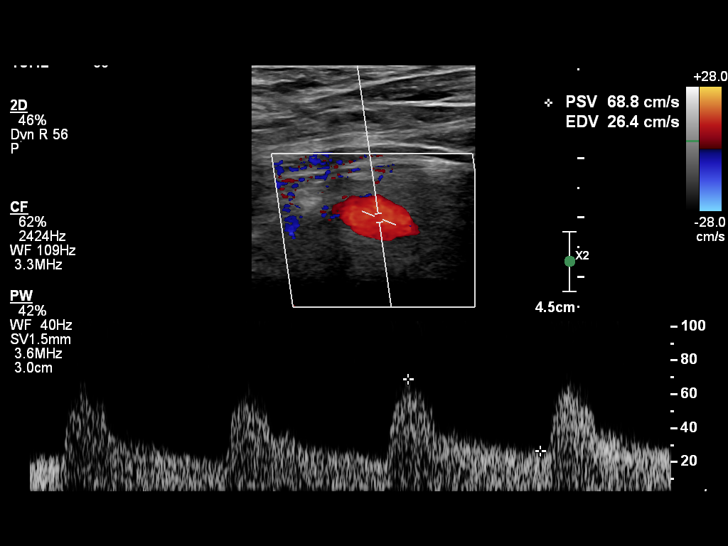
[im 36/36]
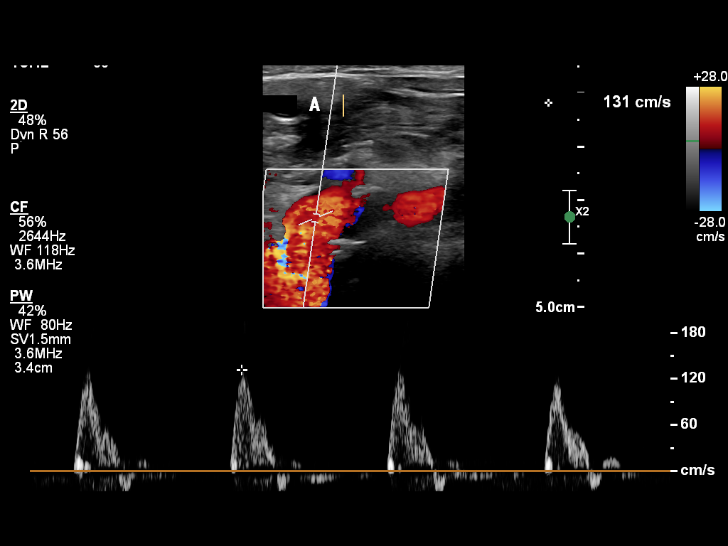

[13 of 24 positions shown; findings below may reference images not displayed]

FINDINGS: Right common and internal carotid arteries demonstrate prior stent placement. Right common carotid artery representative velocities are 48/17 cm/sec. Right external carotid artery peak systolic velocity is 169 cm/sec. Right internal carotid artery maximal velocities are 135/47 cm/sec with ICA to CCA ratio of 2.8. Right vertebral artery flow is  antegrade  and subclavian artery waveform is biphasic.
Left common and internal carotid arteries demonstrate significant heterogeneous plaque formation. Left common carotid artery representative velocities are 66/20 cm/sec. Left external carotid artery peak systolic velocity is 91 cm/sec. Left internal carotid artery maximal velocities are 368/135 cm/sec with ICA to CCA ratio of 5.6. Left vertebral artery flow is  antegrade  and subclavian artery waveform is biphasic.
IMPRESSION: 1. Right internal carotid artery demonstrates velocities consistent with 50 to 79% stenosis, low end of that range. Velocity criteria not well established for stented internal carotid arteries and may represent less than 50% stenosis.
2. Left internal carotid artery demonstrates greater than 80% stenosis.
3. Bilateral vertebral artery flow is antegrade.

## 2023-09-04 IMAGING — CT CT NECK ANGIO WITH AND WITHOUT IV CONTRAST
2 of 7 series · 8 of 33 positions shown · non-contrast
Comparison: Carotid ultrasound from 08/18/2023

Hx of right Carotid artery angioplasty
FINAL REPORT:
CT NECK ANGIO WITH AND WITHOUT IV CONTRAST
INDICATION: Occlusion and stenosis of bilateral carotid arteries
TECHNIQUE: CTA of the neck was performed after the intravenous injection of contrast. Coronal and sagittal 3-D MIP imaging was performed per protocol.
All CT scans at this facility use dose modulation and/or weight based dosing when appropriate to reduce radiation dose to as low as reasonably achievable.
Where applicable, evaluation of internal carotid artery (ICA) stenosis was performed using NASCET-like criteria, where the site of greatest stenosis is compared to the diameter of the ICA distal to the stenosis at a point where the ICA walls become parallel.

[Series 2: cta neck · axial · 0.51mm/px · z∈[-4,+93]mm · 2 of 236 slices shown]
[im 79/236  soft-tissue]
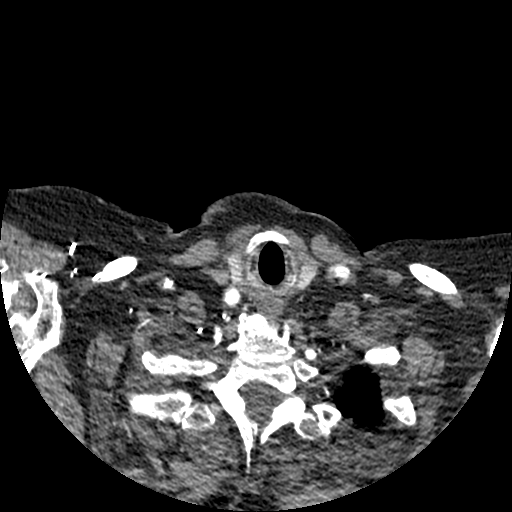
[im 157/236  soft-tissue]
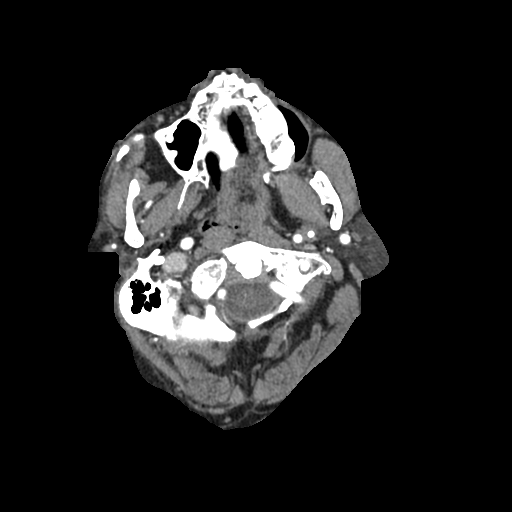

[Series 301: thins · axial · 0.51mm/px · z∈[-60,+150]mm · 6 of 471 slices shown]
[im 68/471  soft-tissue]
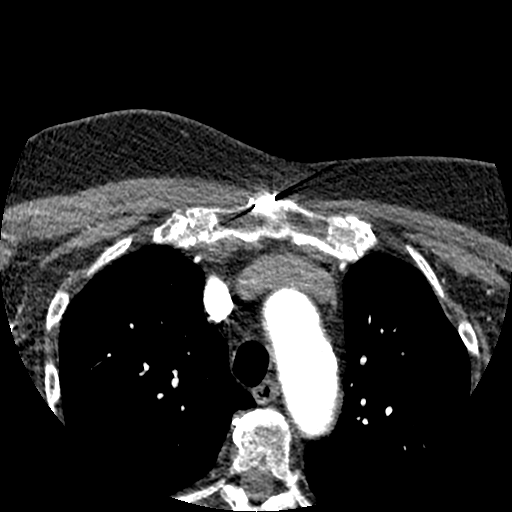
[im 135/471  bone]
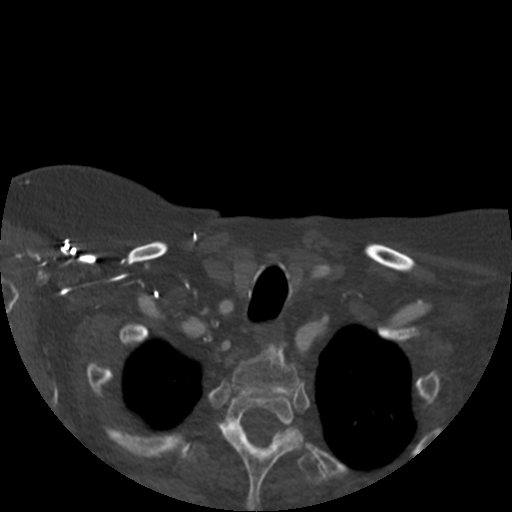
[im 202/471  soft-tissue]
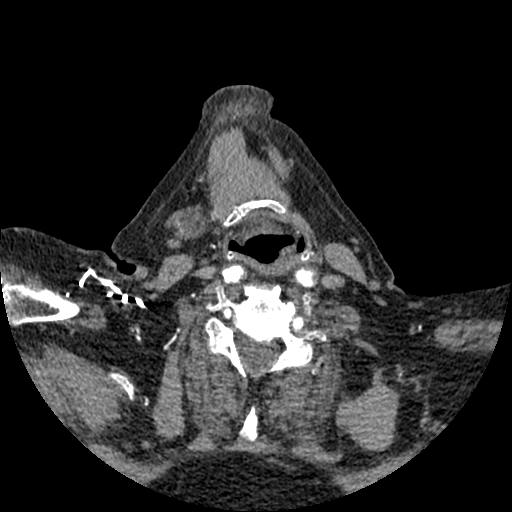
[im 269/471  bone]
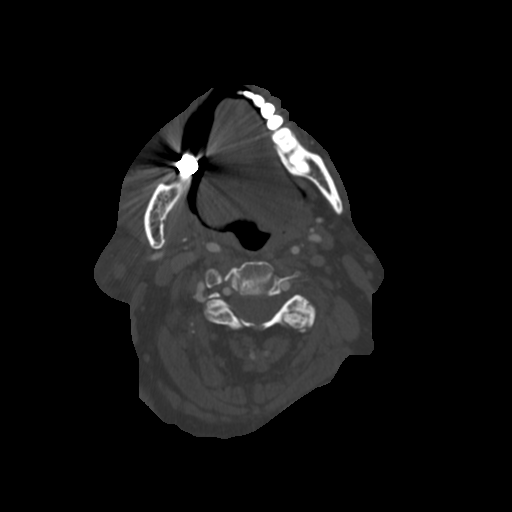
[im 336/471  soft-tissue]
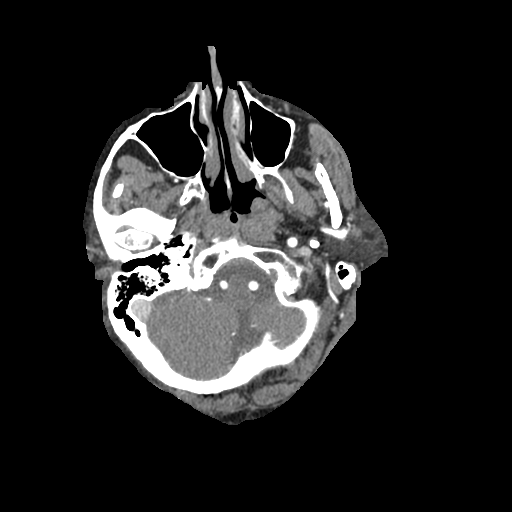
[im 403/471  bone]
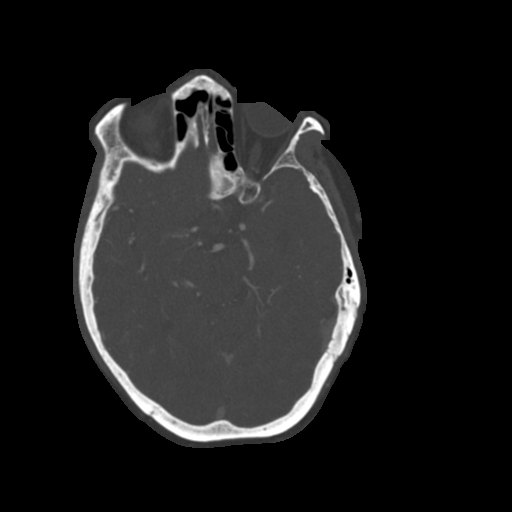

[8 of 33 positions shown; findings below may reference images not displayed]

FINDINGS: CTA NECK:
Aortic arch and proximal great vessels: Atherosclerotic changes without significant narrowing.
Right carotid arterial system: There is a patent stent in the distal right CCA and proximal ICA
Left carotid arterial system: Severe/critical proximal internal carotid artery narrowing secondary to calcified and noncalcified atherosclerosis.
Right vertebral artery: Focal severe atherosclerotic narrowing of the right vertebral artery origin.
Left vertebral artery: Atherosclerotic changes without significant narrowing.
Included Intracranial Structures: Normal
Neck Soft Tissues: Normal
Osseous Structures: Degenerative changes of the cervical spine.
Included Lung Apices: Normal
IMPRESSION: 1. Severe/critical proximal left internal carotid artery narrowing secondary to calcified and noncalcified atherosclerosis.
2. There is a patent stent in the distal right CCA and proximal ICA
3. Focal severe atherosclerotic narrowing of the right vertebral artery origin.

## 2023-10-22 IMAGING — CR XR CHEST 1 VIEW
1 series · 1 of 1 positions shown · non-contrast
Comparison: Chest radiograph dated 05/08/2022.

FINAL REPORT:
Chest portable one view
INDICATION: syncope

[AP]
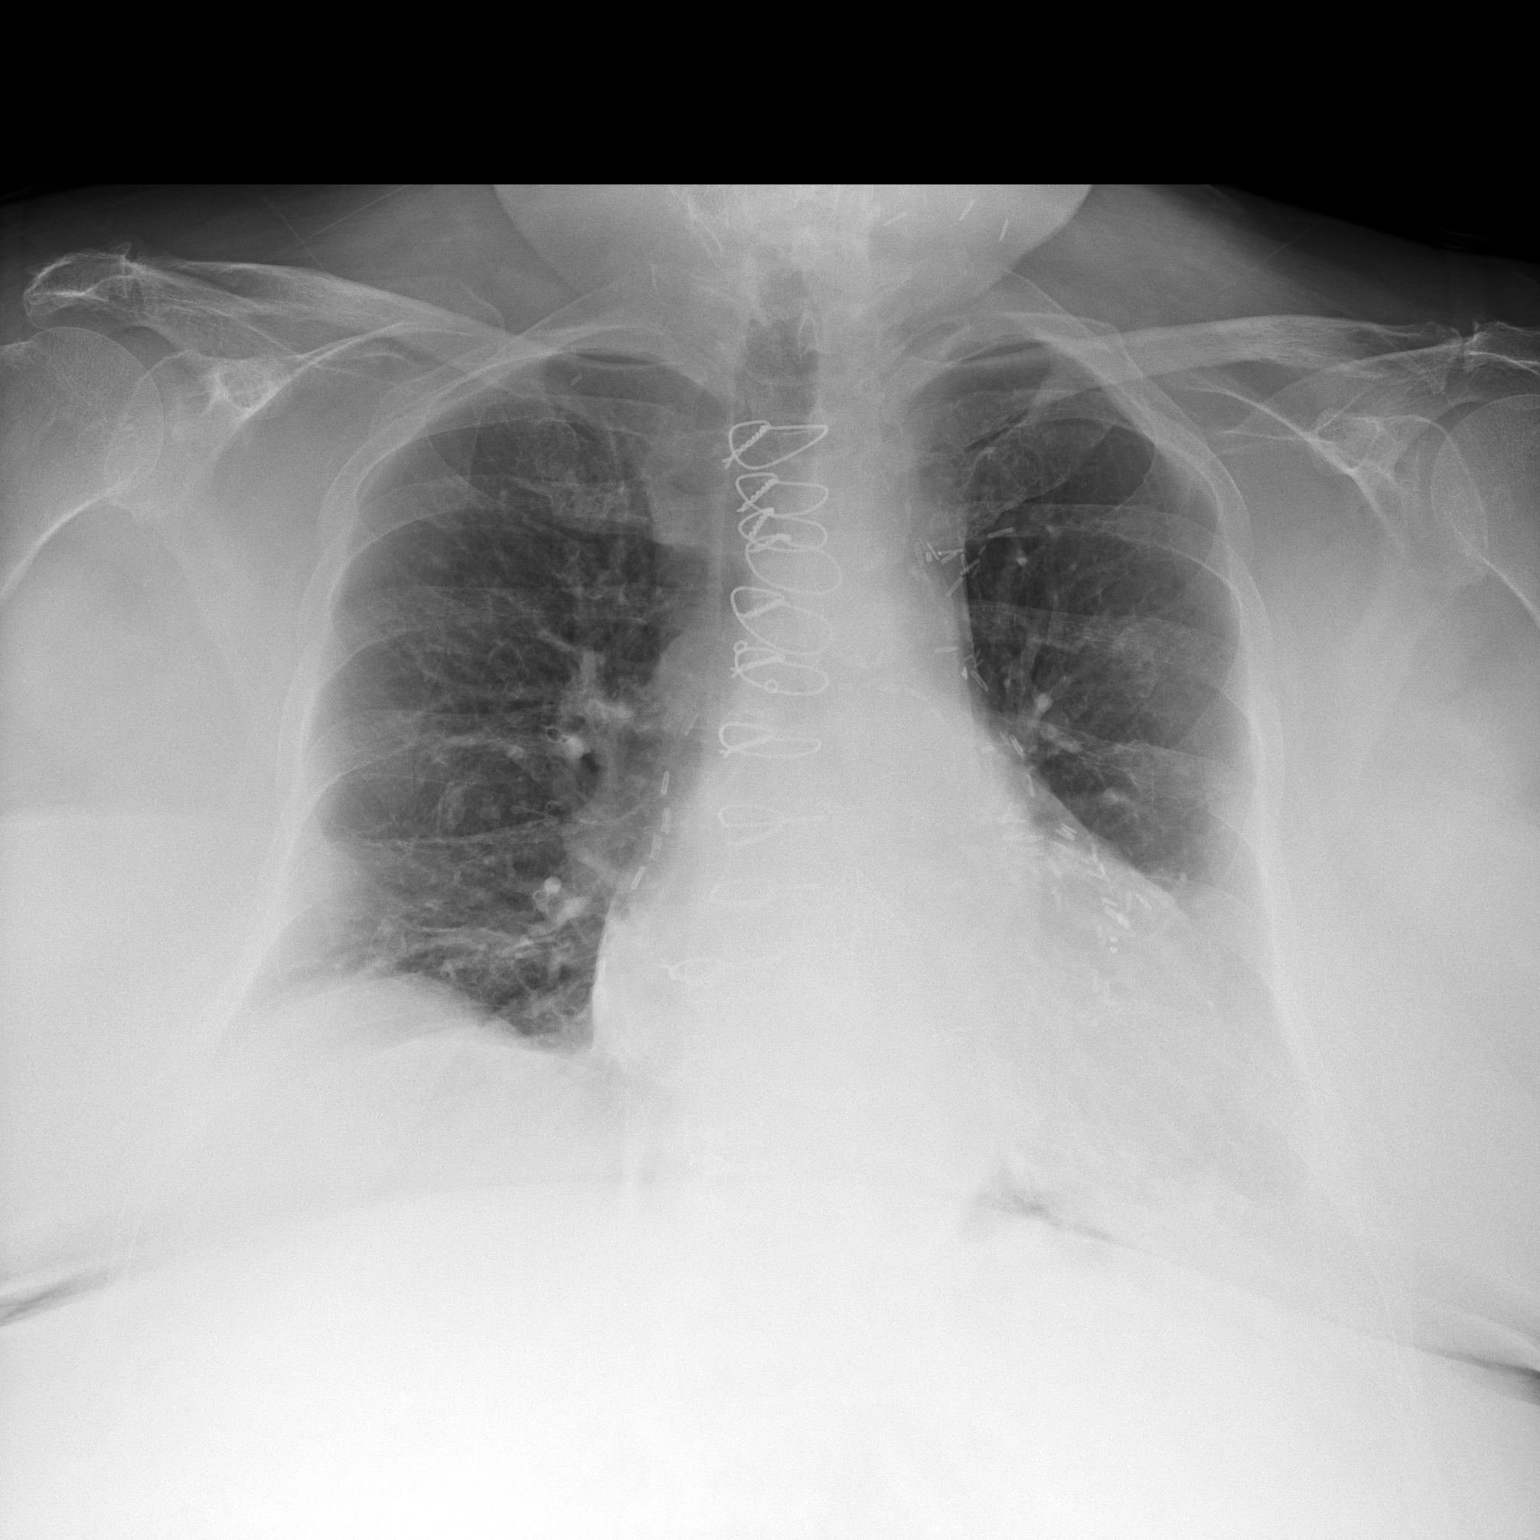

[1 of 1 positions shown; findings below may reference images not displayed]

FINDINGS: There is a small left effusion with atelectasis versus infiltrate. There is no pneumothorax. The heart is mildly enlarged post median sternotomy.
IMPRESSION: 1.  Small left effusion with atelectasis versus infiltrate.
2.  Additional findings as described above.

## 2023-11-10 IMAGING — RF FL ESOPHAGUS BARIUM SWALLOW
13 of 19 series · 14 of 24 positions shown · non-contrast
Comparison: 09/04/2023.

Dysphagia
:89sec
17.4 air kerma
37 images
Dr. Juber Ahmed
Written and verbal discharge instructions given
FINAL REPORT:
EXAM: Double-contrast esophagram.
HISTORY: Other dysphagia
TECHNIQUE: Following the ingestion of effervescent granules and high, and later medium, density barium, multiple double- and single-contrast spot images of the esophagus were obtained in upright and recumbent positions. Fluoroscopy was recorded on digital spot images.
Fluoroscopy dose: Reference air kerma: 17.4 mGy

[Series 1: swallow 4/s · 0.17mm/px · 1 of 1 slices shown (1 of 13)]
[im 1/1]
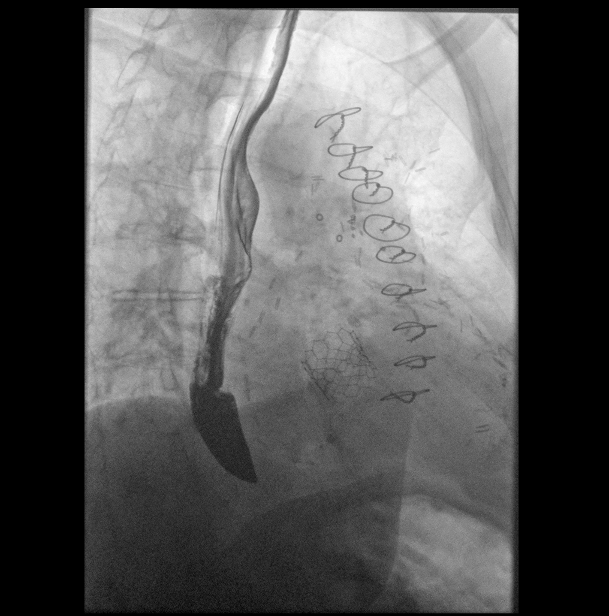

[Series 4: swallow 4/s · 0.17mm/px · 1 of 1 slices shown (2 of 13)]
[im 1/1]
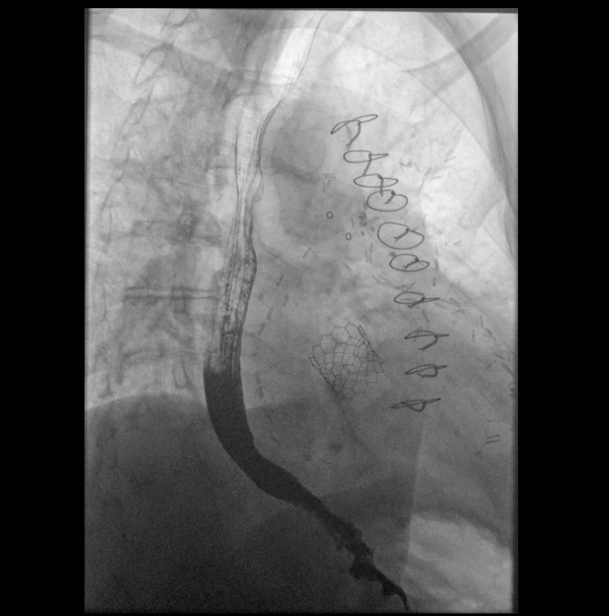

[Series 6: swallow 4/s · 0.17mm/px · 1 of 1 slices shown (3 of 13)]
[im 1/1]
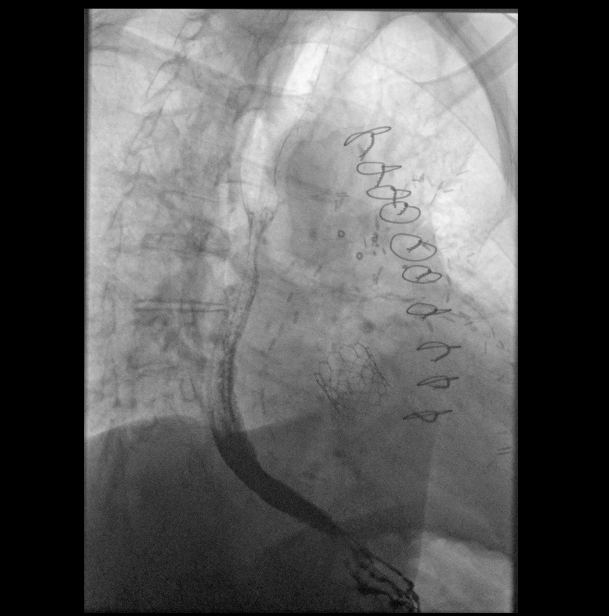

[Series 9: swallow 4/s · 0.17mm/px · 1 of 1 slices shown (4 of 13)]
[im 1/1]
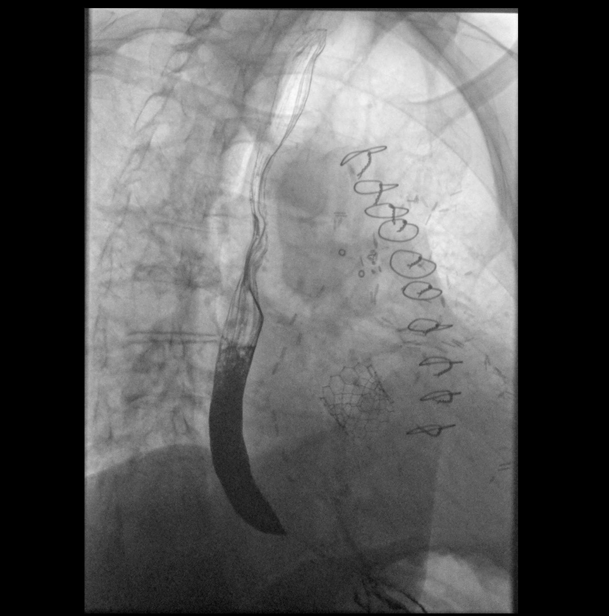

[Series 10: swallow 4/s · 0.17mm/px · 1 of 1 slices shown (5 of 13)]
[im 1/1]
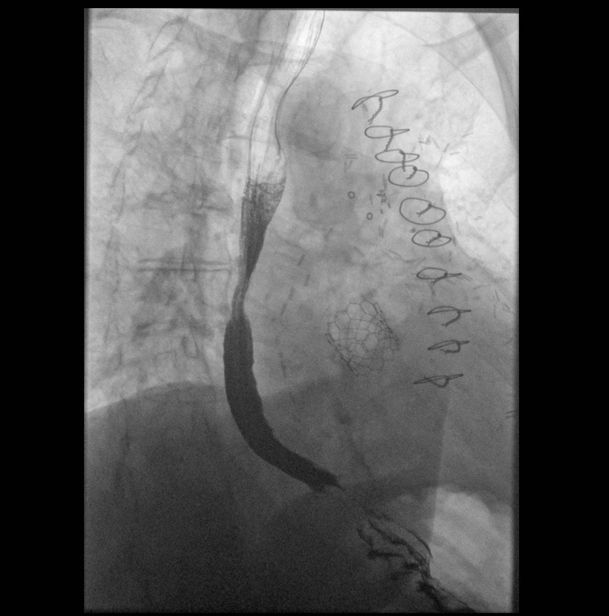

[Series 13: swallow 4/s · 0.17mm/px · 1 of 1 slices shown (6 of 13)]
[im 1/1]
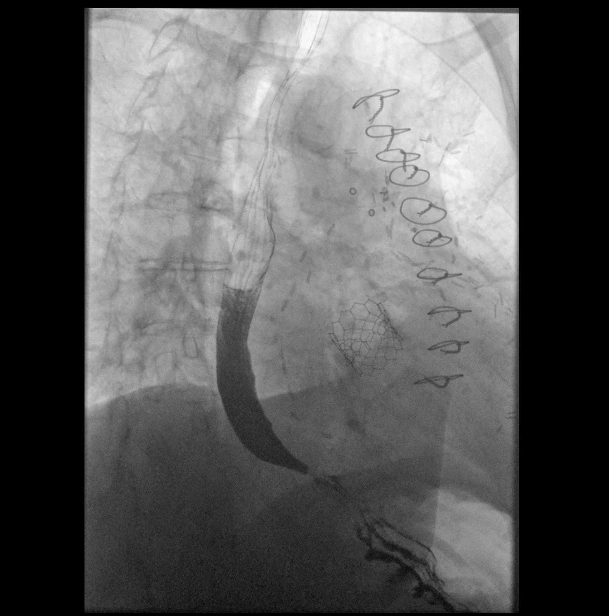

[Series 15: swallow 4/s · 0.17mm/px · 1 of 1 slices shown (7 of 13)]
[im 1/1]
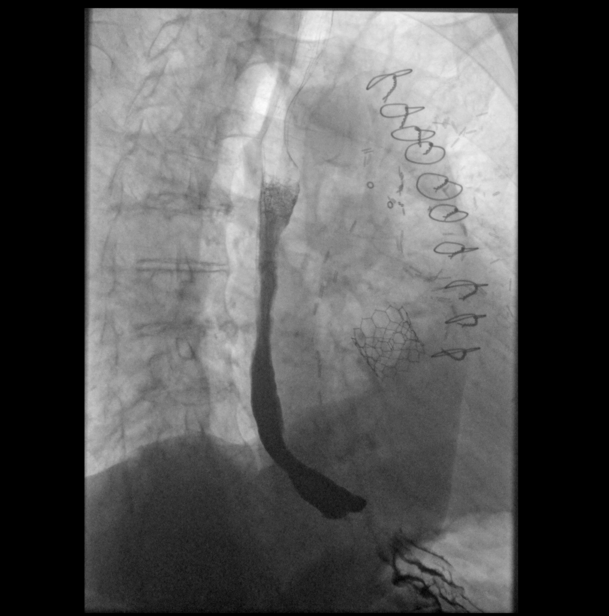

[Series 17: swallow 4/s · 0.17mm/px · 1 of 1 slices shown (8 of 13)]
[im 1/1]
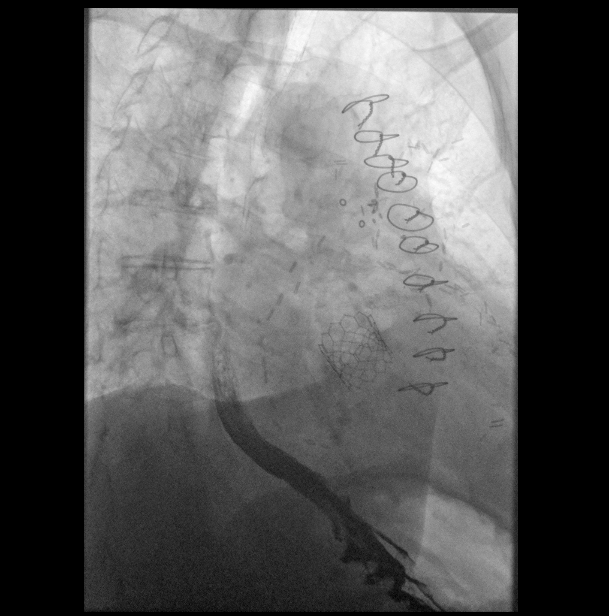

[Series 18: swallow 4/s · 0.08mm/px · 1 of 4 frames shown (9 of 13)]
[frame 3/4]
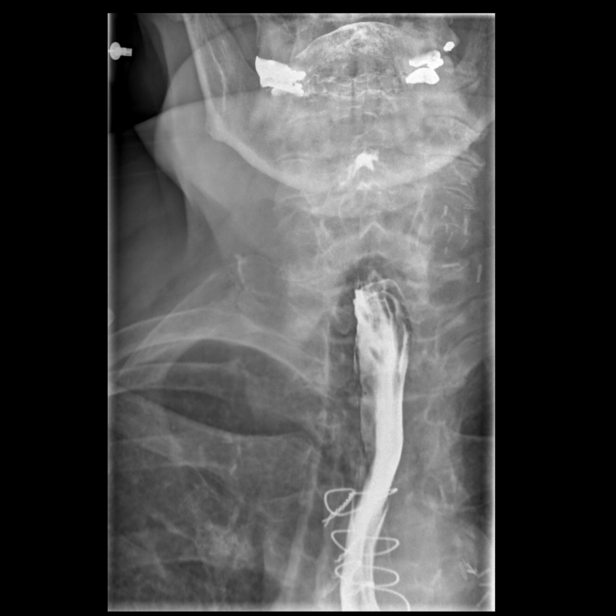

[Series 19: swallow 4/s · 0.08mm/px · 2 of 4 frames shown (10 of 13)]
[frame 2/4]
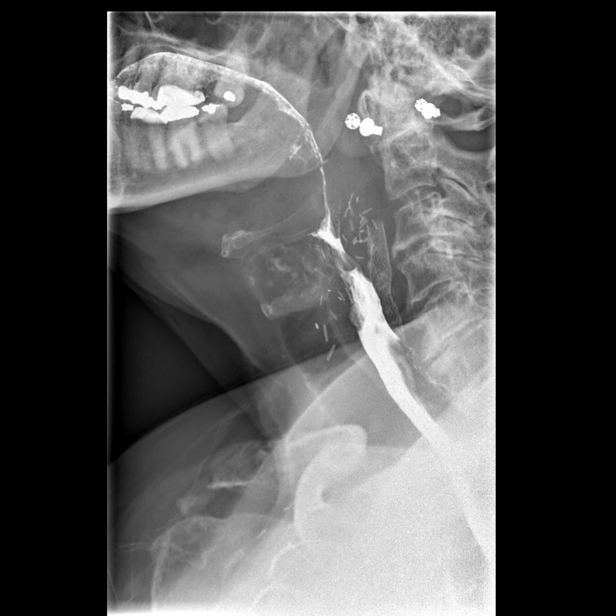
[frame 4/4]
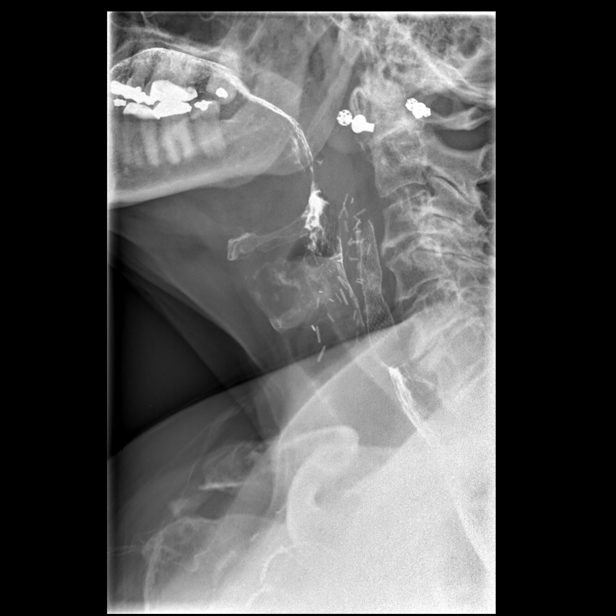

[Series 20: swallow 4/s · 0.08mm/px · 1 of 8 frames shown (11 of 13)]
[frame 5/8]
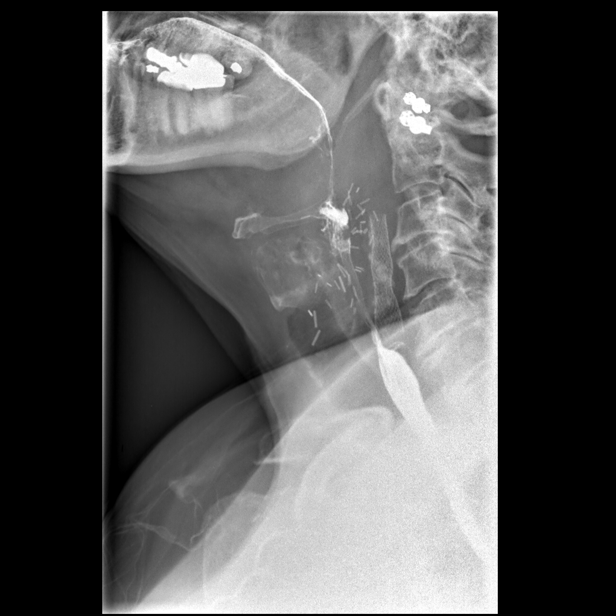

[Series 21: swallow 4/s · 0.17mm/px · 1 of 1 slices shown (12 of 13)]
[im 1/1]
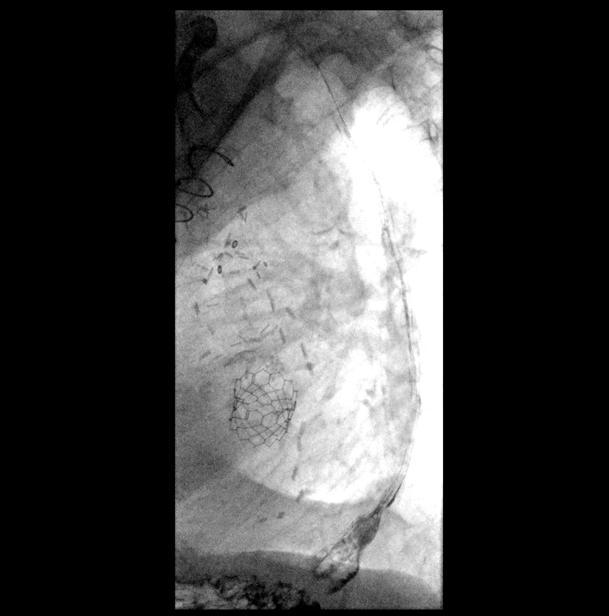

[Series 24: swallow 4/s · 0.17mm/px · 1 of 1 slices shown (13 of 13)]
[im 1/1]
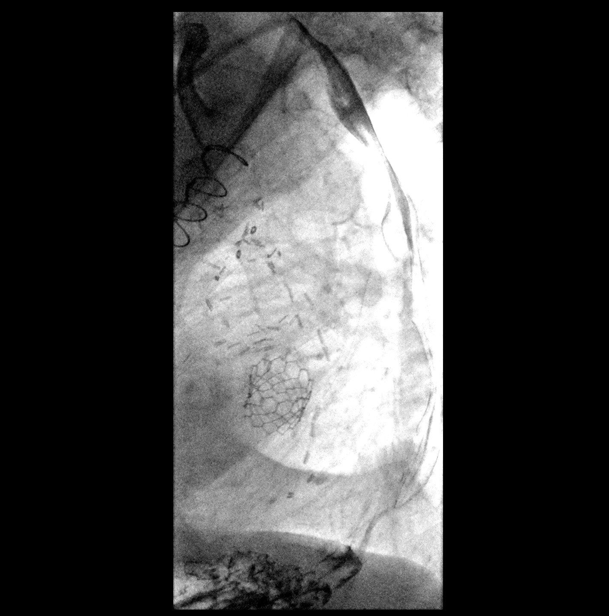

[14 of 24 positions shown; findings below may reference images not displayed]

FINDINGS: Carotid stent is demonstrated.
Esophagus is normal in caliber and contour without mucosal abnormalities.
Primary esophageal dysmotility. A few tertiary contractions were observed.
Small sliding-type hernia. No gastroesophageal reflux was observed on this examination.
A 12.5 mm barium tablet passed through the GE junction without difficulty.
IMPRESSION: 1. Small sliding-type hiatal hernia.
2. Primary esophageal dysmotility with mild esophageal spasm observed.

## 2024-02-27 IMAGING — MR MRA HEAD (BRAIN) ANGIO WITHOUT IV CONTRAST
5 of 7 series · 24 of 48 positions shown · non-contrast
Comparison: None available

FINAL REPORT:
MRA HEAD (BRAIN) ANGIO WITHOUT IV CONTRAST
Cerebral infarction, unspecified
Paresthesia of skin
CLINICAL INDICATION: Cerebral infarction, unspecified
Paresthesia of skin.
TECHNIQUE: 3-D time-of-flight MR angiogram of the head with MIP reconstruction
without IV contrast.

[Series 2001: survey · axial · 8.0mm · 0.51mm/px · z∈[-66,+101]mm · 3 of 20 slices shown]
[im 1/20]
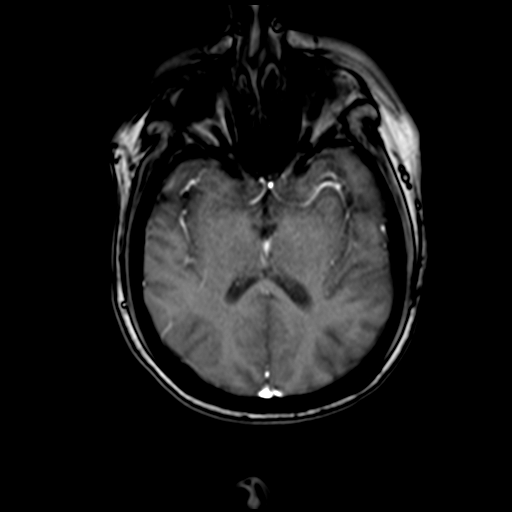
[im 10/20]
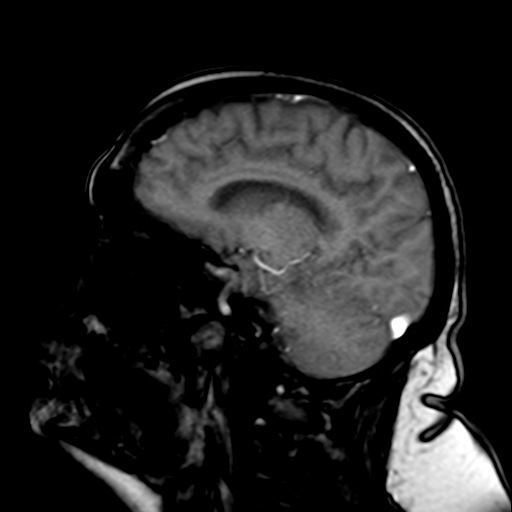
[im 20/20]
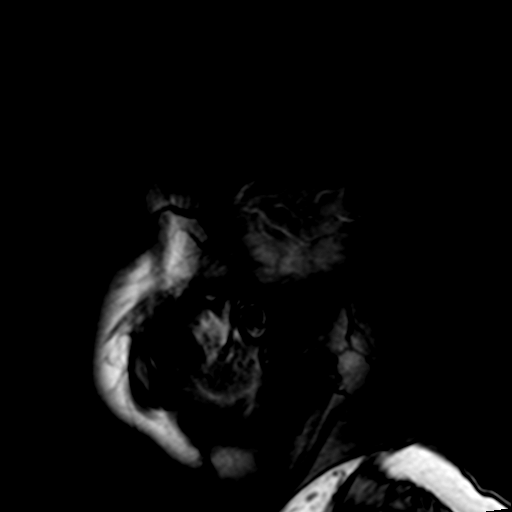

[Series 3001: T1 · sagittal · 5.0mm · 0.40mm/px · 4 of 27 slices shown]
[im 1/27]
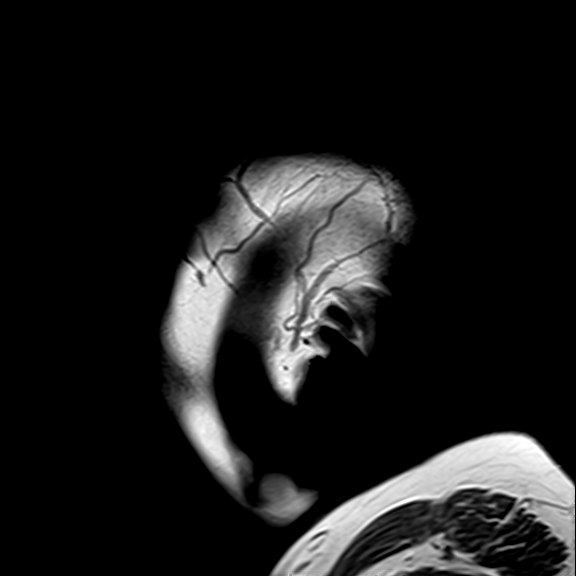
[im 9/27]
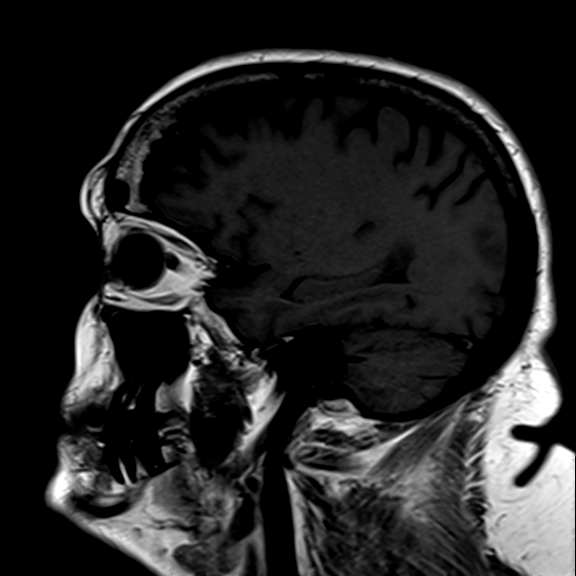
[im 18/27]
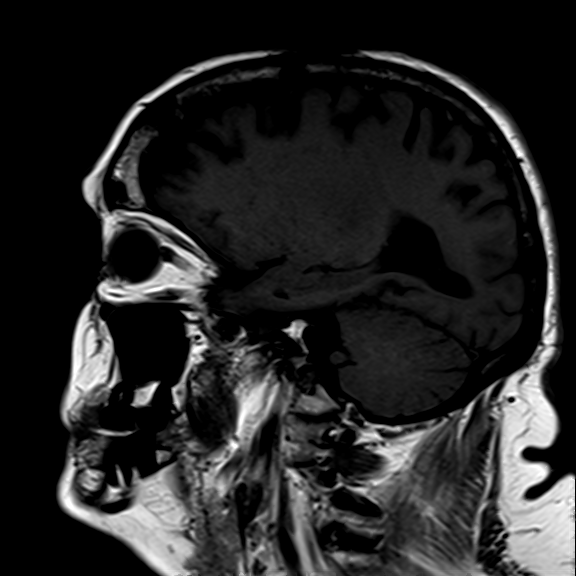
[im 27/27]
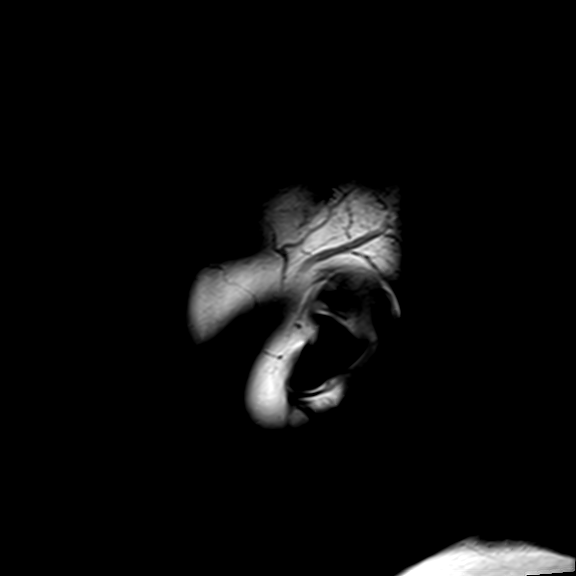

[Series 6001: MRA · axial · 0.5mm · 0.39mm/px · z∈[-74,-0]mm · 9 of 168 slices shown]
[im 7/168]
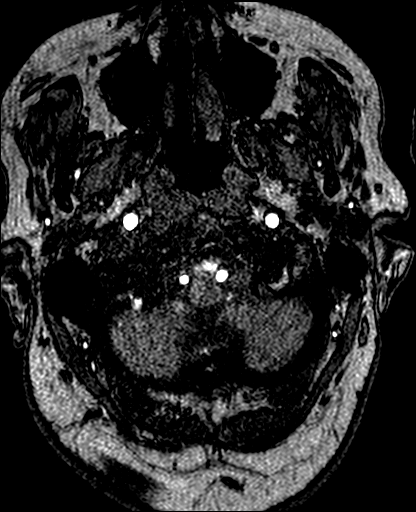
[im 28/168]
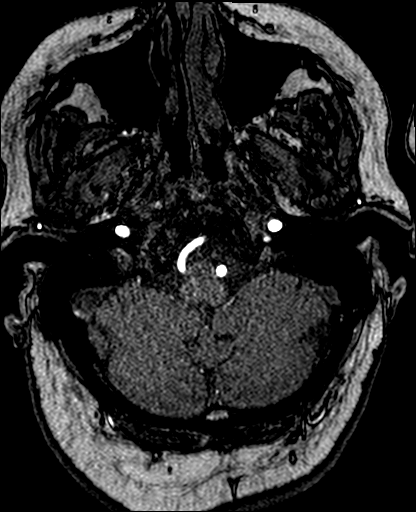
[im 49/168]
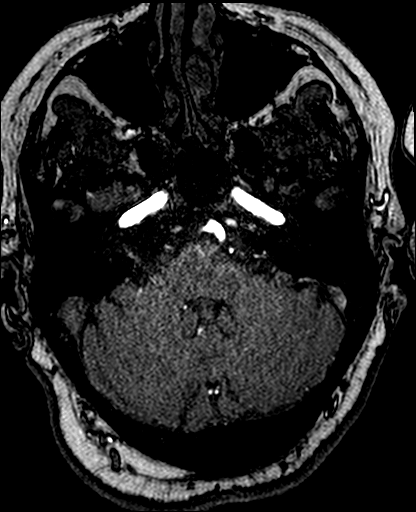
[im 70/168]
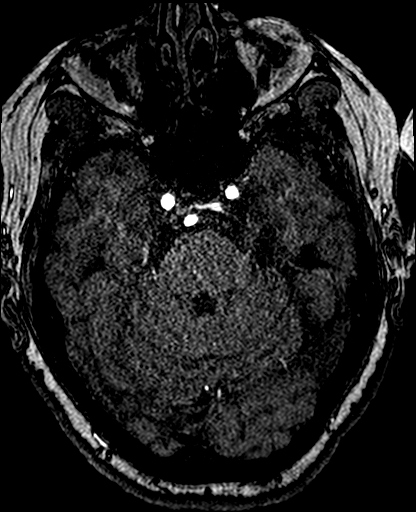
[im 84/168]
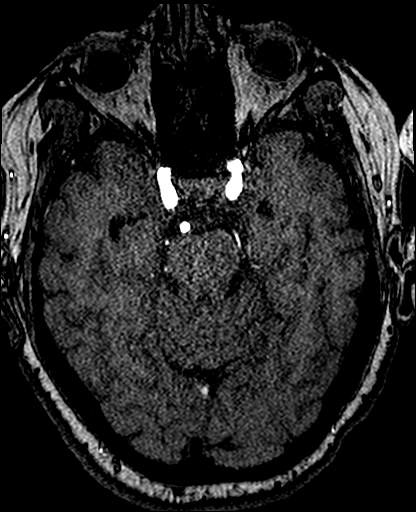
[im 98/168]
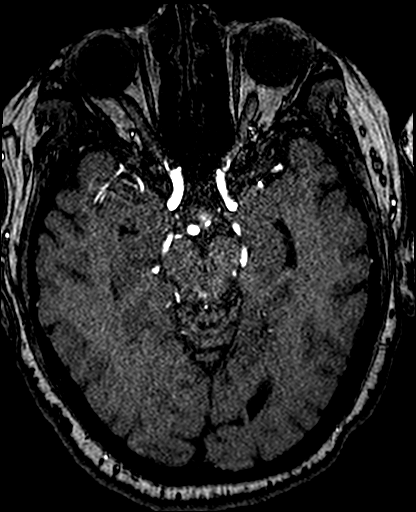
[im 119/168]
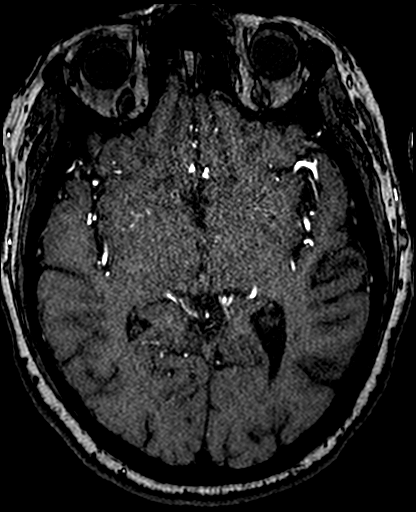
[im 140/168]
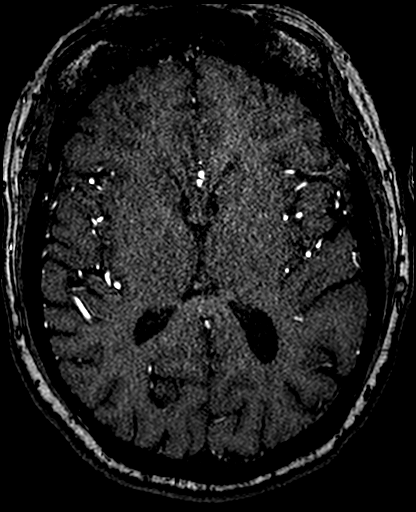
[im 161/168]
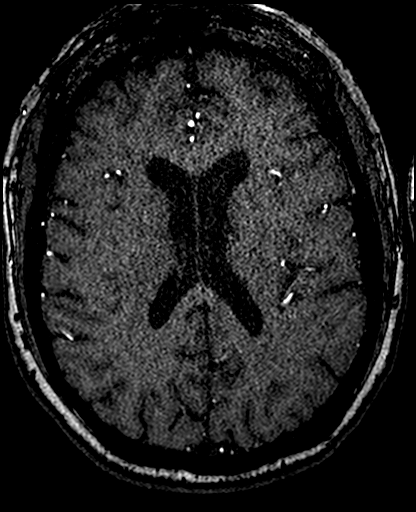

[ax dwi_tracew · axial · 5.0mm · 0.60mm/px · z∈[-92,+54]mm · 4 of 26 slices shown (1 of 2)]
[im 1/26]
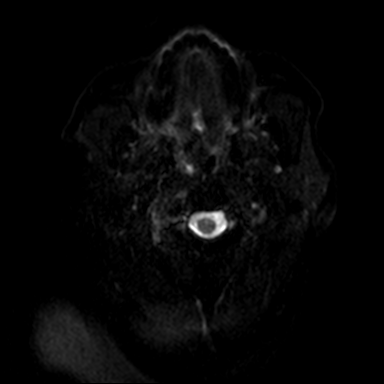
[im 9/26]
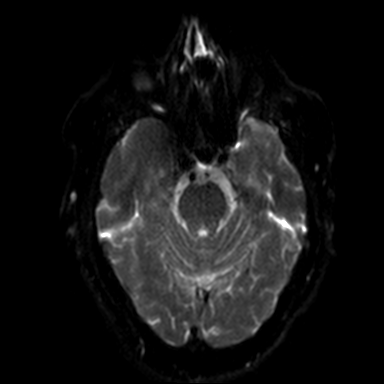
[im 17/26]
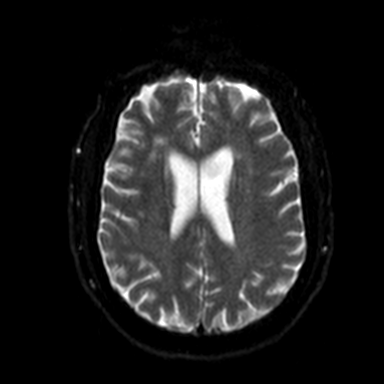
[im 26/26]
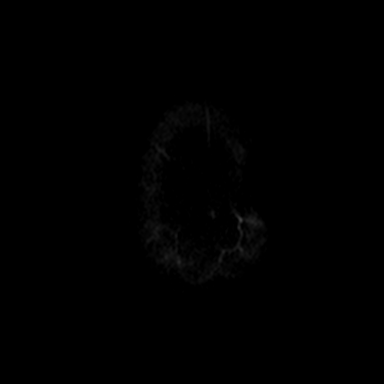

[ax dwi_tracew · axial · 5.0mm · 0.60mm/px · z∈[-92,+54]mm · 4 of 26 slices shown (2 of 2)]
[im 1/26]
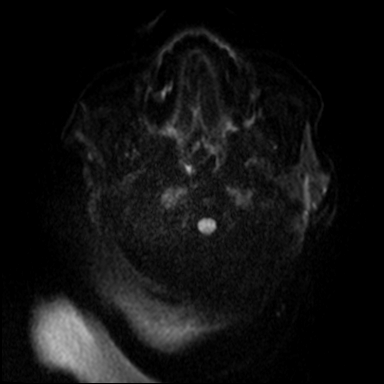
[im 9/26]
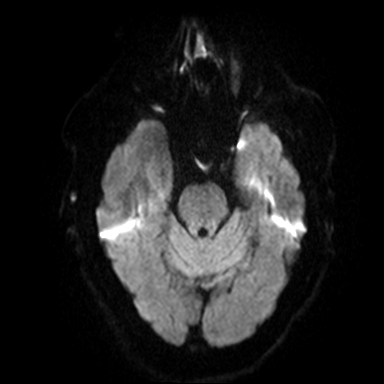
[im 17/26]
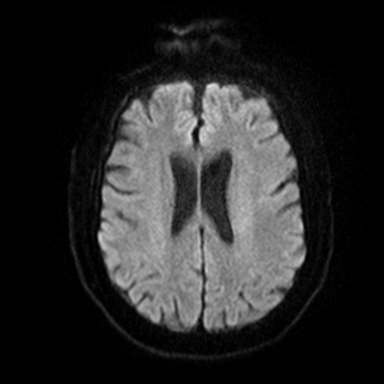
[im 26/26]
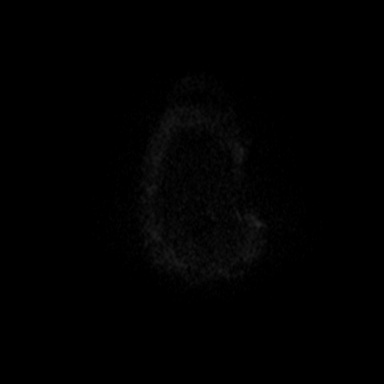

[24 of 48 positions shown; findings below may reference images not displayed]

FINDINGS: Intracranial carotid arteries: Patent. No significant atherosclerotic disease.
Anterior cerebral arteries:  Patent.
Middle cerebral arteries: Patent.
Intracranial vertebral arteries: Patent.
Basilar artery: Patent.
Posterior cerebral arteries: Patent.
Other:
Aneurysm/Vascular malformation: None.
IMPRESSION: No intracranial large vessel occlusion or flow-limiting stenosis.
[DATE] PMFrom Workstation ID: [id number]

## 2024-02-27 IMAGING — MR MRI BRAIN WITHOUT CONTRAST
6 of 10 series · 27 of 48 positions shown · non-contrast
Comparison: None available

FINAL REPORT:
MRI BRAIN WITHOUT CONTRAST
Cerebral infarction, unspecified
Paresthesia of skin
CLINICAL INDICATION: Cerebral infarction, unspecified
Paresthesia of skin.
TECHNIQUE: Multiplanar, multisequence MR imaging of the brain using standard
technique without IV administration of contrast.

[Series 2001: survey · axial · 8.0mm · 0.51mm/px · z∈[-66,+94]mm · 2 of 20 slices shown]
[im 1/20]
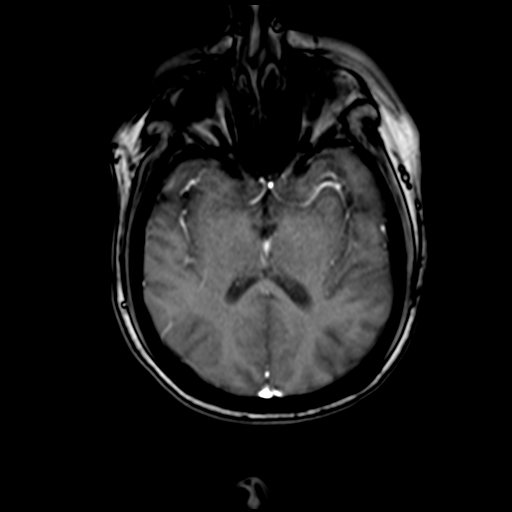
[im 10/20]
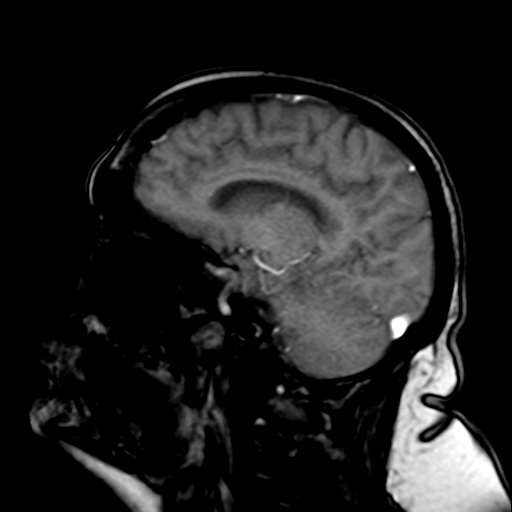

[Series 3001: T1 · sagittal · 5.0mm · 0.40mm/px · 5 of 27 slices shown (1 of 2)]
[im 1/27]
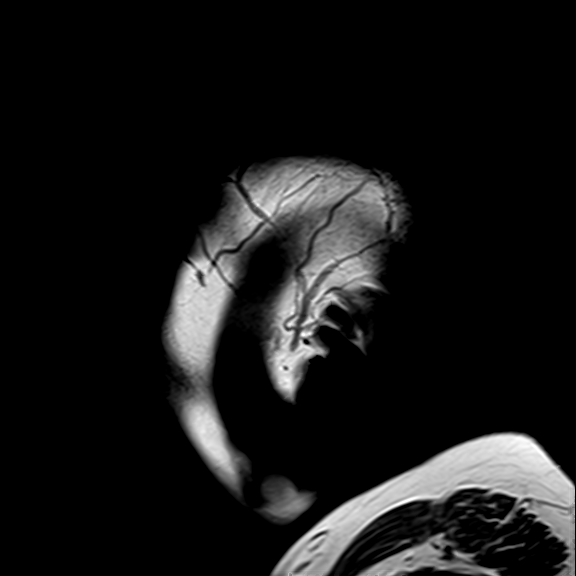
[im 7/27]
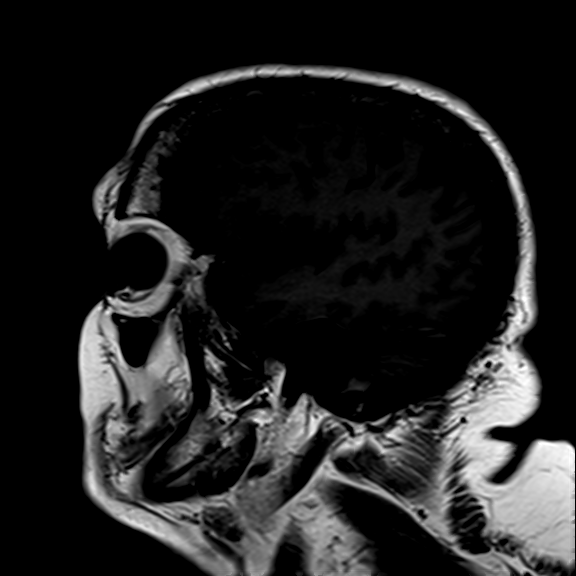
[im 14/27]
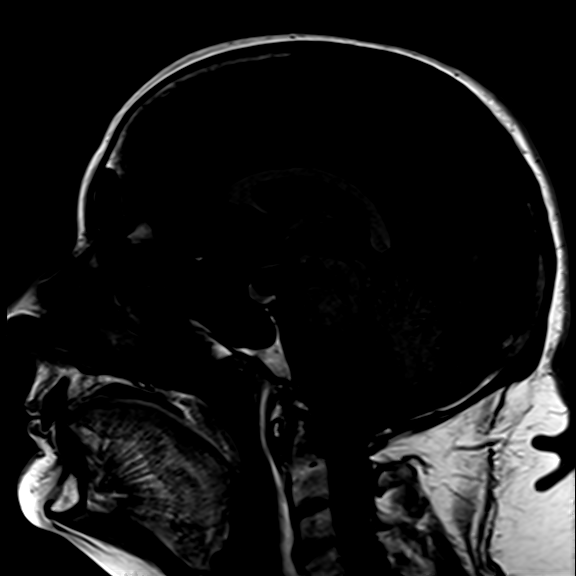
[im 20/27]
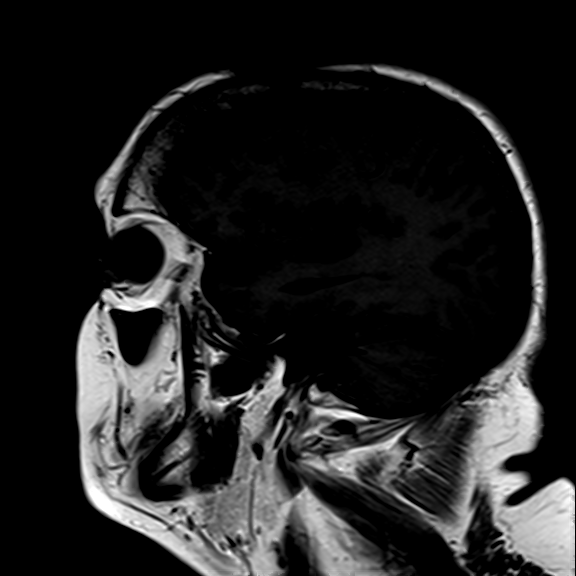
[im 27/27]
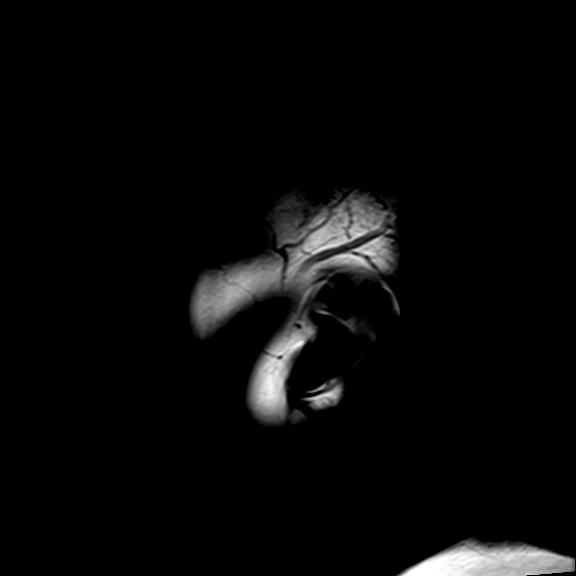

[Series 7001: T2 fat-sat · axial · 5.0mm · 0.51mm/px · z∈[-89,+57]mm · 5 of 26 slices shown]
[im 1/26]
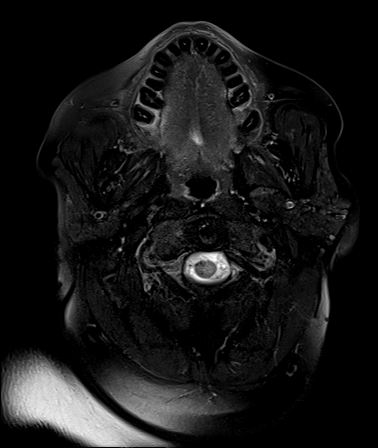
[im 7/26]
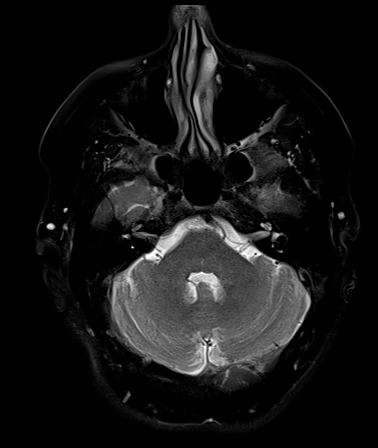
[im 13/26]
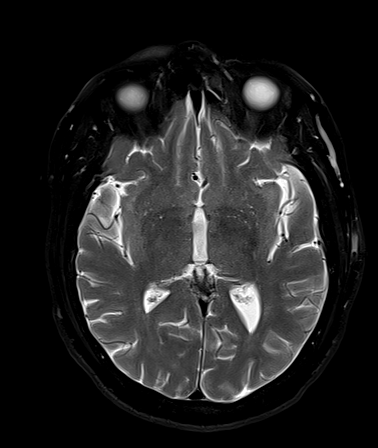
[im 19/26]
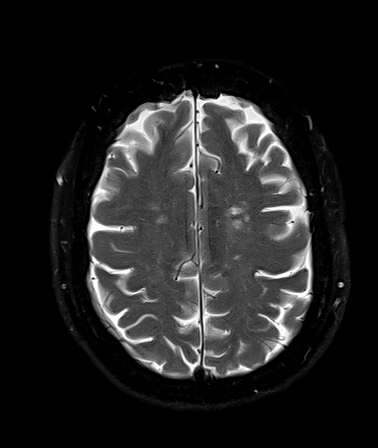
[im 26/26]
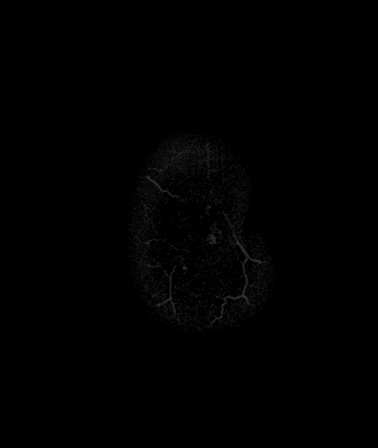

[Series 8001: T1 · axial · 5.0mm · 0.72mm/px · z∈[-89,+57]mm · 5 of 26 slices shown (2 of 2)]
[im 1/26]
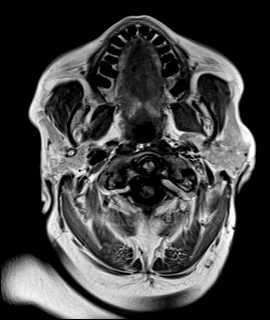
[im 7/26]
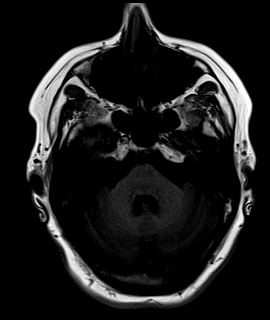
[im 13/26]
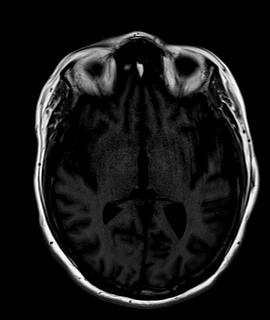
[im 19/26]
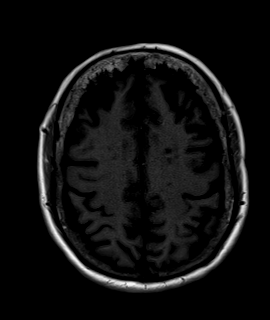
[im 26/26]
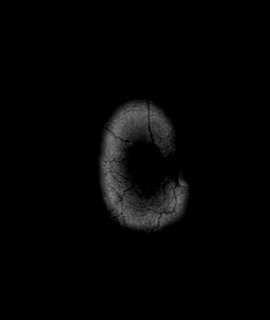

[Series 9001: FLAIR · axial · 5.0mm · 0.72mm/px · z∈[-89,+57]mm · 5 of 26 slices shown]
[im 1/26]
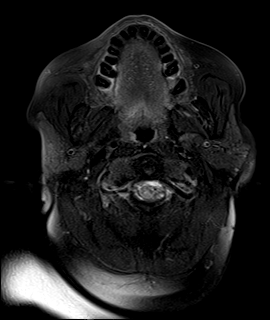
[im 7/26]
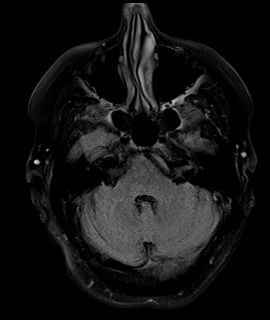
[im 13/26]
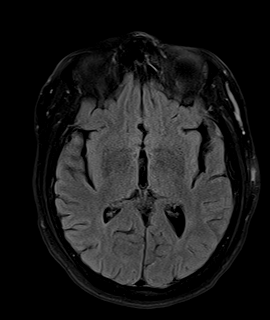
[im 19/26]
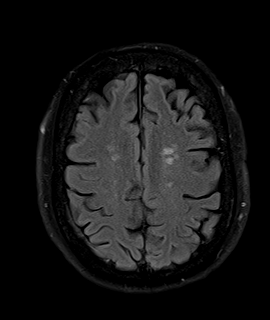
[im 26/26]
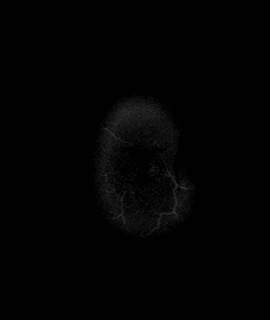

[GRE · axial · 5.0mm · 0.45mm/px · z∈[-89,+57]mm · 5 of 26 slices shown]
[im 1/26]
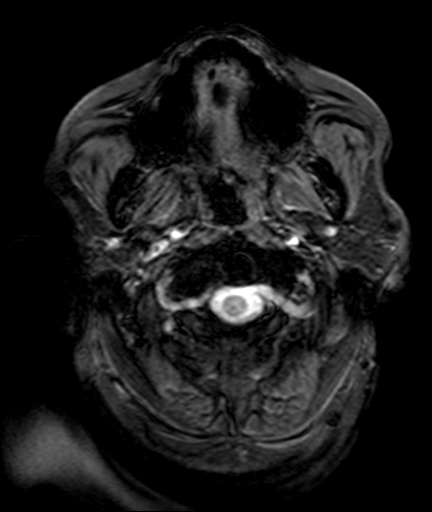
[im 7/26]
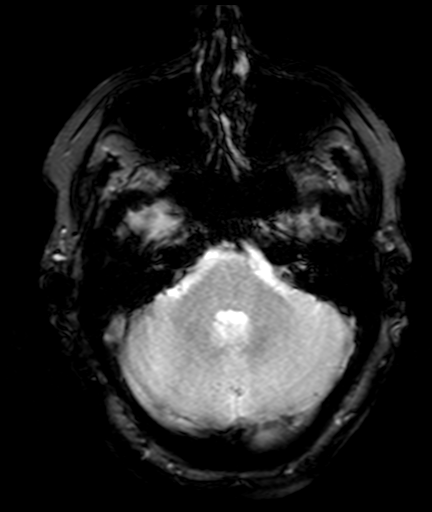
[im 13/26]
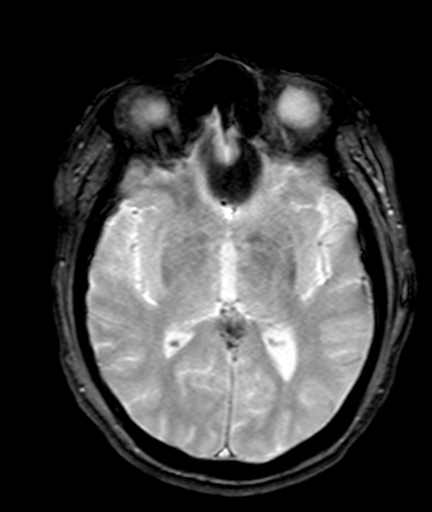
[im 19/26]
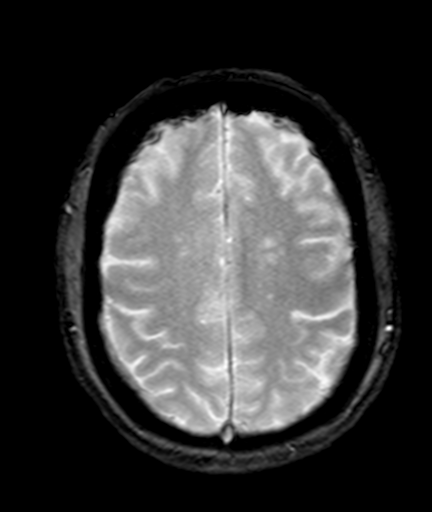
[im 26/26]
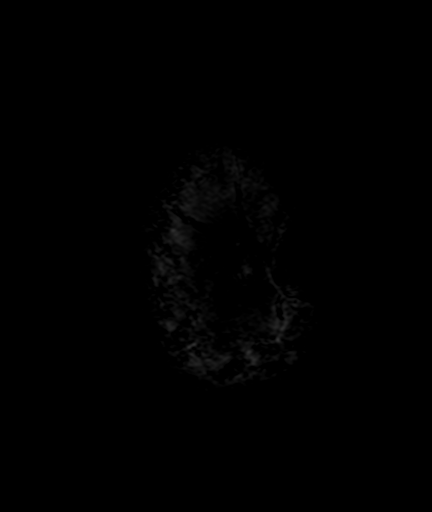

[27 of 48 positions shown; findings below may reference images not displayed]

FINDINGS: No acute intracranial hemorrhage.
No evidence of acute ischemic infarct. Remote left cerebellar infarct.
There are scattered and confluent T2/FLAIR hyperintensities in the subcortical
and periventricular white matter. While nonspecific, these can be seen as
sequela of chronic small vessel ischemia.
No mass effect or midline shift.
No brain herniation.
No evidence of hydrocephalus.
Major Vessel Flow Voids: Preserved.
Paranasal Sinuses: No significant paranasal sinus mucosal thickening/secretions
seen.
Mastoids: No significant mastoid effusion.
Orbits: No evidence of acute orbital pathology.
Bone Marrow: No suspicious marrow signal.
IMPRESSION: No evidence of acute ischemic infarct or intracranial hemorrhage.
[DATE] PMFrom Workstation ID: [id number]

## 8387-01-12 DEATH — deceased
